# Patient Record
Sex: Male | Born: 1950
Health system: Southern US, Community
[De-identification: ages and names within clinical notes are randomized; demographics above are authoritative.]

## PROBLEM LIST (undated history)

## (undated) DIAGNOSIS — I739 Peripheral vascular disease, unspecified: Secondary | ICD-10-CM

## (undated) DIAGNOSIS — Z01810 Encounter for preprocedural cardiovascular examination: Secondary | ICD-10-CM

## (undated) DIAGNOSIS — I10 Essential (primary) hypertension: Secondary | ICD-10-CM

## (undated) DIAGNOSIS — I2510 Atherosclerotic heart disease of native coronary artery without angina pectoris: Secondary | ICD-10-CM

## (undated) DIAGNOSIS — IMO0002 Reserved for concepts with insufficient information to code with codable children: Secondary | ICD-10-CM

## (undated) DIAGNOSIS — R339 Retention of urine, unspecified: Secondary | ICD-10-CM

## (undated) DIAGNOSIS — E785 Hyperlipidemia, unspecified: Secondary | ICD-10-CM

## (undated) DIAGNOSIS — K635 Polyp of colon: Secondary | ICD-10-CM

## (undated) DIAGNOSIS — J449 Chronic obstructive pulmonary disease, unspecified: Secondary | ICD-10-CM

## (undated) DIAGNOSIS — Z720 Tobacco use: Secondary | ICD-10-CM

## (undated) DIAGNOSIS — R0602 Shortness of breath: Secondary | ICD-10-CM

## (undated) DIAGNOSIS — L270 Generalized skin eruption due to drugs and medicaments taken internally: Secondary | ICD-10-CM

## (undated) DIAGNOSIS — I714 Abdominal aortic aneurysm, without rupture, unspecified: Secondary | ICD-10-CM

## (undated) DIAGNOSIS — C3492 Malignant neoplasm of unspecified part of left bronchus or lung: Secondary | ICD-10-CM

## (undated) DIAGNOSIS — R911 Solitary pulmonary nodule: Secondary | ICD-10-CM

## (undated) DIAGNOSIS — F99 Mental disorder, not otherwise specified: Secondary | ICD-10-CM

## (undated) DIAGNOSIS — R9430 Abnormal result of cardiovascular function study, unspecified: Secondary | ICD-10-CM

## (undated) DIAGNOSIS — N400 Enlarged prostate without lower urinary tract symptoms: Secondary | ICD-10-CM

## (undated) HISTORY — DX: Abdominal aortic aneurysm, without rupture, unspecified: I71.40

## (undated) HISTORY — DX: Peripheral vascular disease, unspecified: I73.9

## (undated) HISTORY — DX: Tobacco use: Z72.0

## (undated) HISTORY — DX: Mental disorder, not otherwise specified: F99

## (undated) HISTORY — DX: Chronic obstructive pulmonary disease, unspecified: J44.9

## (undated) HISTORY — DX: Generalized skin eruption due to drugs and medicaments taken internally: L27.0

## (undated) HISTORY — DX: Solitary pulmonary nodule: R91.1

## (undated) HISTORY — DX: Encounter for preprocedural cardiovascular examination: Z01.810

## (undated) HISTORY — DX: Atherosclerotic heart disease of native coronary artery without angina pectoris: I25.10

## (undated) HISTORY — DX: Abdominal aortic aneurysm, without rupture: I71.4

## (undated) HISTORY — DX: Reserved for concepts with insufficient information to code with codable children: IMO0002

## (undated) HISTORY — DX: Polyp of colon: K63.5

## (undated) HISTORY — DX: Malignant neoplasm of unspecified part of left bronchus or lung: C34.92

## (undated) HISTORY — DX: Abnormal result of cardiovascular function study, unspecified: R94.30

---

## 2008-07-10 HISTORY — PX: CORONARY ARTERY BYPASS GRAFT: SHX141

## 2008-07-28 ENCOUNTER — Ambulatory Visit: Payer: Self-pay | Admitting: Cardiology

## 2008-07-28 ENCOUNTER — Inpatient Hospital Stay (HOSPITAL_COMMUNITY): Admission: AD | Admit: 2008-07-28 | Discharge: 2008-08-07 | Payer: Self-pay | Admitting: Cardiology

## 2008-07-28 ENCOUNTER — Encounter: Payer: Self-pay | Admitting: Cardiology

## 2008-07-28 ENCOUNTER — Encounter (INDEPENDENT_AMBULATORY_CARE_PROVIDER_SITE_OTHER): Payer: Self-pay | Admitting: *Deleted

## 2008-07-28 ENCOUNTER — Encounter: Payer: Self-pay | Admitting: Emergency Medicine

## 2008-07-29 ENCOUNTER — Encounter (INDEPENDENT_AMBULATORY_CARE_PROVIDER_SITE_OTHER): Payer: Self-pay | Admitting: *Deleted

## 2008-07-29 ENCOUNTER — Ambulatory Visit: Payer: Self-pay | Admitting: Cardiothoracic Surgery

## 2008-07-29 ENCOUNTER — Encounter: Payer: Self-pay | Admitting: Cardiology

## 2008-07-29 ENCOUNTER — Ambulatory Visit: Payer: Self-pay | Admitting: Internal Medicine

## 2008-07-30 ENCOUNTER — Encounter: Payer: Self-pay | Admitting: Internal Medicine

## 2008-08-17 ENCOUNTER — Ambulatory Visit: Payer: Self-pay | Admitting: Cardiothoracic Surgery

## 2008-08-23 ENCOUNTER — Ambulatory Visit: Payer: Self-pay | Admitting: Cardiology

## 2008-08-23 ENCOUNTER — Encounter: Payer: Self-pay | Admitting: Nurse Practitioner

## 2008-08-23 DIAGNOSIS — I5022 Chronic systolic (congestive) heart failure: Secondary | ICD-10-CM

## 2008-08-23 DIAGNOSIS — K921 Melena: Secondary | ICD-10-CM

## 2008-08-26 ENCOUNTER — Encounter: Payer: Self-pay | Admitting: Cardiology

## 2008-08-26 ENCOUNTER — Encounter: Admission: RE | Admit: 2008-08-26 | Discharge: 2008-08-26 | Payer: Self-pay | Admitting: Cardiothoracic Surgery

## 2008-08-26 ENCOUNTER — Ambulatory Visit: Payer: Self-pay | Admitting: Cardiothoracic Surgery

## 2008-09-09 ENCOUNTER — Encounter: Payer: Self-pay | Admitting: Cardiology

## 2008-09-25 ENCOUNTER — Encounter: Payer: Self-pay | Admitting: Cardiology

## 2008-10-13 ENCOUNTER — Encounter: Payer: Self-pay | Admitting: Cardiology

## 2008-11-09 ENCOUNTER — Encounter: Payer: Self-pay | Admitting: Cardiology

## 2008-11-09 DIAGNOSIS — Z951 Presence of aortocoronary bypass graft: Secondary | ICD-10-CM

## 2008-11-10 ENCOUNTER — Ambulatory Visit: Payer: Self-pay | Admitting: Cardiology

## 2008-11-12 DIAGNOSIS — R109 Unspecified abdominal pain: Secondary | ICD-10-CM | POA: Insufficient documentation

## 2008-11-12 DIAGNOSIS — K5900 Constipation, unspecified: Secondary | ICD-10-CM | POA: Insufficient documentation

## 2008-12-28 ENCOUNTER — Ambulatory Visit: Payer: Self-pay | Admitting: Internal Medicine

## 2008-12-28 LAB — CONVERTED CEMR LAB
Basophils Absolute: 0 10*3/uL (ref 0.0–0.1)
HCT: 44.6 % (ref 39.0–52.0)
Lymphs Abs: 2.1 10*3/uL (ref 0.7–4.0)
MCV: 90.8 fL (ref 78.0–100.0)
Monocytes Absolute: 0.4 10*3/uL (ref 0.1–1.0)
Platelets: 216 10*3/uL (ref 150.0–400.0)
RDW: 17 % — ABNORMAL HIGH (ref 11.5–14.6)
Vitamin B-12: 271 pg/mL (ref 211–911)

## 2009-01-03 ENCOUNTER — Ambulatory Visit: Payer: Self-pay | Admitting: Internal Medicine

## 2009-01-03 ENCOUNTER — Encounter: Payer: Self-pay | Admitting: Internal Medicine

## 2009-01-05 ENCOUNTER — Encounter: Payer: Self-pay | Admitting: Internal Medicine

## 2009-02-25 ENCOUNTER — Ambulatory Visit: Payer: Self-pay | Admitting: Cardiology

## 2009-03-11 ENCOUNTER — Encounter: Payer: Self-pay | Admitting: Infectious Diseases

## 2009-06-29 ENCOUNTER — Encounter (INDEPENDENT_AMBULATORY_CARE_PROVIDER_SITE_OTHER): Payer: Self-pay | Admitting: *Deleted

## 2010-02-20 ENCOUNTER — Encounter: Payer: Self-pay | Admitting: Internal Medicine

## 2010-02-20 ENCOUNTER — Telehealth: Payer: Self-pay | Admitting: Internal Medicine

## 2010-04-13 NOTE — Progress Notes (Signed)
Summary: Needs Bloodwork    ---- 02/20/2010 8:16 AM, Karen Kitchens Nelson-Smith CMA (AAMA) wrote: This patient's endoscopy report in 10/10 states that patients h & h needs to be followed closely.Marland KitchenMarland KitchenMarland KitchenI dont see where they have had labs completed in some time....would you like me to get a CBC?  ---- 02/20/2010 8:29 AM, Hart Carwin MD wrote: Yes, please. I just don't remember who is his PCP, may be , he would rather have his CBC checked  with him if he is out of town.  Phone Note Outgoing Call   Call placed by: Lamona Curl CMA Duncan Dull),  February 20, 2010 10:54 AM Call placed to: Patient Summary of Call: I have tried to contact patient on phone number originally provided. However, this is the incorrect number. There are no alternative numbers to call as his emergency contact has the same number as the patient. I have updated our system by taking this number out. I will send a letter to patient asking him to call with updated information. Initial call taken by: Lamona Curl CMA Duncan Dull),  February 20, 2010 10:56 AM

## 2010-04-13 NOTE — Letter (Signed)
Summary: Appointment - Missed  [facility], Main Office  [address]   [place], [place] [address]   Phone: [phone]  Fax: [phone]     June 29, 2009 MRN: [id number]   [name] [address] [place], [place]  [address]   Dear [name],  Our records indicate you missed your appointment on  06/27/2009 with Dr. Myrtis Ser. It is very important that we reach you to reschedule this appointment. We look forward to participating in your health care needs. Please contact us at the number listed above at your earliest convenience to reschedule this appointment.     Sincerely,   Migdalia Dk [facility] Scheduling Team

## 2010-04-13 NOTE — Letter (Signed)
Summary: Update demographics/need [facility]  [address] [place], [place] [address]   Phone: [phone]  Fax: [phone]                    02/20/2010  [name] [address] [place], [place]  [address]  [place]  Dear Mr. Harkleroad,  Our office has recently attempted to reach you regarding some needed labwork. Unfortunately, the number available to our office as your contact number is the incorrect number. Please call our office as soon as possible to update your demographic information and discuss labwork. Thank you for your prompt attention to this matter.  Sincerely,     Hedwig Morton. Juanda Chance, MD  Appended Document: Update demographics/need labwork As of date, patient has not responded to our letter.

## 2010-06-20 LAB — CBC
HCT: 28.9 % — ABNORMAL LOW (ref 39.0–52.0)
HCT: 29.1 % — ABNORMAL LOW (ref 39.0–52.0)
HCT: 29.3 % — ABNORMAL LOW (ref 39.0–52.0)
HCT: 29.7 % — ABNORMAL LOW (ref 39.0–52.0)
HCT: 29.9 % — ABNORMAL LOW (ref 39.0–52.0)
HCT: 35.2 % — ABNORMAL LOW (ref 39.0–52.0)
HCT: 35.3 % — ABNORMAL LOW (ref 39.0–52.0)
HCT: 37.2 % — ABNORMAL LOW (ref 39.0–52.0)
HCT: 50.3 % (ref 39.0–52.0)
Hemoglobin: 10 g/dL — ABNORMAL LOW (ref 13.0–17.0)
Hemoglobin: 10.1 g/dL — ABNORMAL LOW (ref 13.0–17.0)
Hemoglobin: 10.4 g/dL — ABNORMAL LOW (ref 13.0–17.0)
Hemoglobin: 11.8 g/dL — ABNORMAL LOW (ref 13.0–17.0)
Hemoglobin: 11.8 g/dL — ABNORMAL LOW (ref 13.0–17.0)
Hemoglobin: 12.1 g/dL — ABNORMAL LOW (ref 13.0–17.0)
Hemoglobin: 12.4 g/dL — ABNORMAL LOW (ref 13.0–17.0)
Hemoglobin: 12.7 g/dL — ABNORMAL LOW (ref 13.0–17.0)
Hemoglobin: 12.8 g/dL — ABNORMAL LOW (ref 13.0–17.0)
Hemoglobin: 13.5 g/dL (ref 13.0–17.0)
Hemoglobin: 16.7 g/dL (ref 13.0–17.0)
Hemoglobin: 9.7 g/dL — ABNORMAL LOW (ref 13.0–17.0)
Hemoglobin: 9.9 g/dL — ABNORMAL LOW (ref 13.0–17.0)
MCHC: 33.5 g/dL (ref 30.0–36.0)
MCHC: 33.5 g/dL (ref 30.0–36.0)
MCHC: 33.6 g/dL (ref 30.0–36.0)
MCHC: 33.7 g/dL (ref 30.0–36.0)
MCHC: 33.9 g/dL (ref 30.0–36.0)
MCHC: 34 g/dL (ref 30.0–36.0)
MCHC: 34.2 g/dL (ref 30.0–36.0)
MCHC: 34.3 g/dL (ref 30.0–36.0)
MCHC: 34.3 g/dL (ref 30.0–36.0)
MCHC: 34.7 g/dL (ref 30.0–36.0)
MCV: 91.6 fL (ref 78.0–100.0)
MCV: 91.9 fL (ref 78.0–100.0)
MCV: 92.3 fL (ref 78.0–100.0)
MCV: 92.4 fL (ref 78.0–100.0)
MCV: 92.5 fL (ref 78.0–100.0)
MCV: 92.6 fL (ref 78.0–100.0)
MCV: 92.6 fL (ref 78.0–100.0)
MCV: 92.7 fL (ref 78.0–100.0)
MCV: 92.8 fL (ref 78.0–100.0)
MCV: 93.9 fL (ref 78.0–100.0)
Platelets: 173 10*3/uL (ref 150–400)
Platelets: 181 10*3/uL (ref 150–400)
Platelets: 182 10*3/uL (ref 150–400)
Platelets: 192 10*3/uL (ref 150–400)
Platelets: 206 10*3/uL (ref 150–400)
Platelets: 212 10*3/uL (ref 150–400)
Platelets: 239 10*3/uL (ref 150–400)
Platelets: 268 10*3/uL (ref 150–400)
RBC: 3.07 MIL/uL — ABNORMAL LOW (ref 4.22–5.81)
RBC: 3.14 MIL/uL — ABNORMAL LOW (ref 4.22–5.81)
RBC: 3.17 MIL/uL — ABNORMAL LOW (ref 4.22–5.81)
RBC: 3.21 MIL/uL — ABNORMAL LOW (ref 4.22–5.81)
RBC: 3.25 MIL/uL — ABNORMAL LOW (ref 4.22–5.81)
RBC: 3.71 MIL/uL — ABNORMAL LOW (ref 4.22–5.81)
RBC: 3.81 MIL/uL — ABNORMAL LOW (ref 4.22–5.81)
RBC: 3.95 MIL/uL — ABNORMAL LOW (ref 4.22–5.81)
RBC: 4.11 MIL/uL — ABNORMAL LOW (ref 4.22–5.81)
RBC: 4.34 MIL/uL (ref 4.22–5.81)
RDW: 13.9 % (ref 11.5–15.5)
RDW: 14 % (ref 11.5–15.5)
RDW: 14 % (ref 11.5–15.5)
RDW: 14.2 % (ref 11.5–15.5)
RDW: 14.2 % (ref 11.5–15.5)
RDW: 14.3 % (ref 11.5–15.5)
RDW: 14.4 % (ref 11.5–15.5)
RDW: 14.4 % (ref 11.5–15.5)
RDW: 14.4 % (ref 11.5–15.5)
WBC: 10 10*3/uL (ref 4.0–10.5)
WBC: 10.3 10*3/uL (ref 4.0–10.5)
WBC: 10.7 10*3/uL — ABNORMAL HIGH (ref 4.0–10.5)
WBC: 5.9 10*3/uL (ref 4.0–10.5)
WBC: 7.2 10*3/uL (ref 4.0–10.5)
WBC: 9.2 10*3/uL (ref 4.0–10.5)
WBC: 9.3 10*3/uL (ref 4.0–10.5)
WBC: 9.5 10*3/uL (ref 4.0–10.5)
WBC: 9.6 10*3/uL (ref 4.0–10.5)

## 2010-06-20 LAB — CROSSMATCH
ABO/RH(D): O POS
Antibody Screen: NEGATIVE

## 2010-06-20 LAB — TROPONIN I
Troponin I: 12.76 ng/mL (ref 0.00–0.06)
Troponin I: 15.69 ng/mL (ref 0.00–0.06)

## 2010-06-20 LAB — DIFFERENTIAL
Basophils Absolute: 0 10*3/uL (ref 0.0–0.1)
Basophils Relative: 0 % (ref 0–1)
Basophils Relative: 0 % (ref 0–1)
Eosinophils Absolute: 0 10*3/uL (ref 0.0–0.7)
Eosinophils Relative: 0 % (ref 0–5)
Lymphocytes Relative: 7 % — ABNORMAL LOW (ref 12–46)
Lymphs Abs: 1.4 10*3/uL (ref 0.7–4.0)
Monocytes Absolute: 0.6 10*3/uL (ref 0.1–1.0)
Monocytes Relative: 9 % (ref 3–12)
Neutro Abs: 9 10*3/uL — ABNORMAL HIGH (ref 1.7–7.7)
Neutrophils Relative %: 79 % — ABNORMAL HIGH (ref 43–77)
Neutrophils Relative %: 88 % — ABNORMAL HIGH (ref 43–77)

## 2010-06-20 LAB — POCT I-STAT 4, (NA,K, GLUC, HGB,HCT)
Glucose, Bld: 102 mg/dL — ABNORMAL HIGH (ref 70–99)
Glucose, Bld: 111 mg/dL — ABNORMAL HIGH (ref 70–99)
HCT: 27 % — ABNORMAL LOW (ref 39.0–52.0)
HCT: 37 % — ABNORMAL LOW (ref 39.0–52.0)
HCT: 38 % — ABNORMAL LOW (ref 39.0–52.0)
Hemoglobin: 12.2 g/dL — ABNORMAL LOW (ref 13.0–17.0)
Hemoglobin: 12.6 g/dL — ABNORMAL LOW (ref 13.0–17.0)
Hemoglobin: 12.9 g/dL — ABNORMAL LOW (ref 13.0–17.0)
Hemoglobin: 9.2 g/dL — ABNORMAL LOW (ref 13.0–17.0)
Potassium: 4 mEq/L (ref 3.5–5.1)
Potassium: 4.3 mEq/L (ref 3.5–5.1)
Potassium: 5.7 mEq/L — ABNORMAL HIGH (ref 3.5–5.1)
Sodium: 136 mEq/L (ref 135–145)
Sodium: 137 mEq/L (ref 135–145)
Sodium: 138 mEq/L (ref 135–145)
Sodium: 141 mEq/L (ref 135–145)

## 2010-06-20 LAB — GLUCOSE, CAPILLARY
Glucose-Capillary: 100 mg/dL — ABNORMAL HIGH (ref 70–99)
Glucose-Capillary: 101 mg/dL — ABNORMAL HIGH (ref 70–99)
Glucose-Capillary: 101 mg/dL — ABNORMAL HIGH (ref 70–99)
Glucose-Capillary: 106 mg/dL — ABNORMAL HIGH (ref 70–99)
Glucose-Capillary: 111 mg/dL — ABNORMAL HIGH (ref 70–99)
Glucose-Capillary: 121 mg/dL — ABNORMAL HIGH (ref 70–99)
Glucose-Capillary: 127 mg/dL — ABNORMAL HIGH (ref 70–99)
Glucose-Capillary: 150 mg/dL — ABNORMAL HIGH (ref 70–99)
Glucose-Capillary: 68 mg/dL — ABNORMAL LOW (ref 70–99)
Glucose-Capillary: 71 mg/dL (ref 70–99)
Glucose-Capillary: 71 mg/dL (ref 70–99)
Glucose-Capillary: 91 mg/dL (ref 70–99)
Glucose-Capillary: 91 mg/dL (ref 70–99)
Glucose-Capillary: 94 mg/dL (ref 70–99)
Glucose-Capillary: 97 mg/dL (ref 70–99)

## 2010-06-20 LAB — BASIC METABOLIC PANEL
BUN: 12 mg/dL (ref 6–23)
BUN: 13 mg/dL (ref 6–23)
BUN: 14 mg/dL (ref 6–23)
BUN: 8 mg/dL (ref 6–23)
CO2: 27 mEq/L (ref 19–32)
CO2: 28 mEq/L (ref 19–32)
CO2: 29 mEq/L (ref 19–32)
CO2: 30 mEq/L (ref 19–32)
Calcium: 8.2 mg/dL — ABNORMAL LOW (ref 8.4–10.5)
Calcium: 8.4 mg/dL (ref 8.4–10.5)
Calcium: 8.6 mg/dL (ref 8.4–10.5)
Calcium: 8.6 mg/dL (ref 8.4–10.5)
Calcium: 8.9 mg/dL (ref 8.4–10.5)
Chloride: 103 mEq/L (ref 96–112)
Chloride: 104 mEq/L (ref 96–112)
Chloride: 105 mEq/L (ref 96–112)
Chloride: 106 mEq/L (ref 96–112)
Chloride: 99 mEq/L (ref 96–112)
Creatinine, Ser: 0.67 mg/dL (ref 0.4–1.5)
Creatinine, Ser: 0.67 mg/dL (ref 0.4–1.5)
Creatinine, Ser: 0.71 mg/dL (ref 0.4–1.5)
Creatinine, Ser: 0.77 mg/dL (ref 0.4–1.5)
Creatinine, Ser: 0.81 mg/dL (ref 0.4–1.5)
GFR calc Af Amer: >60 mL/min (ref >60)
GFR calc Af Amer: >60 mL/min (ref >60)
GFR calc Af Amer: >60 mL/min (ref >60)
GFR calc Af Amer: >60 mL/min (ref >60)
GFR calc Af Amer: >60 mL/min (ref >60)
GFR calc non Af Amer: >60 mL/min (ref >60)
GFR calc non Af Amer: >60 mL/min (ref >60)
GFR calc non Af Amer: >60 mL/min (ref >60)
GFR calc non Af Amer: >60 mL/min (ref >60)
Glucose, Bld: 102 mg/dL — ABNORMAL HIGH (ref 70–99)
Glucose, Bld: 103 mg/dL — ABNORMAL HIGH (ref 70–99)
Glucose, Bld: 114 mg/dL — ABNORMAL HIGH (ref 70–99)
Glucose, Bld: 122 mg/dL — ABNORMAL HIGH (ref 70–99)
Potassium: 3.8 mEq/L (ref 3.5–5.1)
Potassium: 4.2 mEq/L (ref 3.5–5.1)
Potassium: 4.5 mEq/L (ref 3.5–5.1)
Potassium: 4.5 mEq/L (ref 3.5–5.1)
Sodium: 136 mEq/L (ref 135–145)
Sodium: 136 mEq/L (ref 135–145)
Sodium: 137 mEq/L (ref 135–145)
Sodium: 138 mEq/L (ref 135–145)
Sodium: 141 mEq/L (ref 135–145)

## 2010-06-20 LAB — BLOOD GAS, ARTERIAL
Acid-Base Excess: 4.9 mmol/L — ABNORMAL HIGH (ref 0.0–2.0)
Bicarbonate: 28.8 mEq/L — ABNORMAL HIGH (ref 20.0–24.0)
FIO2: 0.21 %
O2 Saturation: 97.4 %
Patient temperature: 98.6
TCO2: 30.1 mmol/L (ref 0–100)
pCO2 arterial: 42 mmHg (ref 35.0–45.0)
pH, Arterial: 7.45 (ref 7.350–7.450)
pO2, Arterial: 85.2 mmHg (ref 80.0–100.0)

## 2010-06-20 LAB — PROTIME-INR
INR: 1.3 (ref 0.00–1.49)
Prothrombin Time: 14.1 seconds (ref 11.6–15.2)
Prothrombin Time: 16.4 seconds — ABNORMAL HIGH (ref 11.6–15.2)

## 2010-06-20 LAB — APTT
aPTT: 115 seconds — ABNORMAL HIGH (ref 24–37)
aPTT: 28 seconds (ref 24–37)
aPTT: 35 seconds (ref 24–37)

## 2010-06-20 LAB — COMPREHENSIVE METABOLIC PANEL
ALT: 30 U/L (ref 0–53)
AST: 94 U/L — ABNORMAL HIGH (ref 0–37)
Albumin: 2.7 g/dL — ABNORMAL LOW (ref 3.5–5.2)
Albumin: 3.9 g/dL (ref 3.5–5.2)
Alkaline Phosphatase: 64 U/L (ref 39–117)
Alkaline Phosphatase: 72 U/L (ref 39–117)
Alkaline Phosphatase: 76 U/L (ref 39–117)
BUN: 11 mg/dL (ref 6–23)
BUN: 12 mg/dL (ref 6–23)
BUN: 13 mg/dL (ref 6–23)
CO2: 24 mEq/L (ref 19–32)
Calcium: 8.5 mg/dL (ref 8.4–10.5)
Calcium: 8.5 mg/dL (ref 8.4–10.5)
Chloride: 102 mEq/L (ref 96–112)
Chloride: 104 mEq/L (ref 96–112)
Creatinine, Ser: 0.8 mg/dL (ref 0.4–1.5)
Creatinine, Ser: 1.12 mg/dL (ref 0.4–1.5)
GFR calc Af Amer: >60 mL/min (ref >60)
GFR calc non Af Amer: >60 mL/min (ref >60)
Glucose, Bld: 106 mg/dL — ABNORMAL HIGH (ref 70–99)
Glucose, Bld: 114 mg/dL — ABNORMAL HIGH (ref 70–99)
Glucose, Bld: 144 mg/dL — ABNORMAL HIGH (ref 70–99)
Glucose, Bld: 172 mg/dL — ABNORMAL HIGH (ref 70–99)
Potassium: 3.7 mEq/L (ref 3.5–5.1)
Potassium: 3.7 mEq/L (ref 3.5–5.1)
Potassium: 3.8 mEq/L (ref 3.5–5.1)
Sodium: 136 mEq/L (ref 135–145)
Total Bilirubin: 0.3 mg/dL (ref 0.3–1.2)
Total Bilirubin: 0.9 mg/dL (ref 0.3–1.2)
Total Protein: 5.7 g/dL — ABNORMAL LOW (ref 6.0–8.3)
Total Protein: 6 g/dL (ref 6.0–8.3)

## 2010-06-20 LAB — HEPARIN LEVEL (UNFRACTIONATED)
Heparin Unfractionated: 0.32 IU/mL (ref 0.30–0.70)
Heparin Unfractionated: 0.33 IU/mL (ref 0.30–0.70)
Heparin Unfractionated: 0.6 IU/mL (ref 0.30–0.70)
Heparin Unfractionated: 0.71 IU/mL — ABNORMAL HIGH (ref 0.30–0.70)

## 2010-06-20 LAB — URINALYSIS, ROUTINE W REFLEX MICROSCOPIC
Leukocytes, UA: NEGATIVE
Nitrite: NEGATIVE
Protein, ur: 100 mg/dL — AB
Specific Gravity, Urine: 1.021 (ref 1.005–1.030)
Urobilinogen, UA: 0.2 mg/dL (ref 0.0–1.0)

## 2010-06-20 LAB — POCT I-STAT 3, ART BLOOD GAS (G3+)
Acid-Base Excess: 1 mmol/L (ref 0.0–2.0)
Acid-Base Excess: 2 mmol/L (ref 0.0–2.0)
Bicarbonate: 27.2 mEq/L — ABNORMAL HIGH (ref 20.0–24.0)
Bicarbonate: 27.2 mEq/L — ABNORMAL HIGH (ref 20.0–24.0)
O2 Saturation: 100 %
O2 Saturation: 97 %
O2 Saturation: 99 %
Patient temperature: 35.7
Patient temperature: 37.1
Patient temperature: 37.1
TCO2: 28 mmol/L (ref 0–100)
TCO2: 28 mmol/L (ref 0–100)
pCO2 arterial: 26 mmHg — ABNORMAL LOW (ref 35.0–45.0)
pH, Arterial: 7.377 (ref 7.350–7.450)
pH, Arterial: 7.408 (ref 7.350–7.450)
pO2, Arterial: 203 mmHg — ABNORMAL HIGH (ref 80.0–100.0)
pO2, Arterial: 258 mmHg — ABNORMAL HIGH (ref 80.0–100.0)

## 2010-06-20 LAB — URINE MICROSCOPIC-ADD ON

## 2010-06-20 LAB — CK TOTAL AND CKMB (NOT AT ARMC)
CK, MB: 19.3 ng/mL — ABNORMAL HIGH (ref 0.3–4.0)
CK, MB: 47.1 ng/mL — ABNORMAL HIGH (ref 0.3–4.0)
Relative Index: 13.8 — ABNORMAL HIGH (ref 0.0–2.5)
Relative Index: 2.9 — ABNORMAL HIGH (ref 0.0–2.5)
Relative Index: 7.4 — ABNORMAL HIGH (ref 0.0–2.5)
Total CK: 1262 U/L — ABNORMAL HIGH (ref 7–232)
Total CK: 639 U/L — ABNORMAL HIGH (ref 7–232)
Total CK: 677 U/L — ABNORMAL HIGH (ref 7–232)

## 2010-06-20 LAB — RAPID URINE DRUG SCREEN, HOSP PERFORMED
Amphetamines: NOT DETECTED
Benzodiazepines: NOT DETECTED
Cocaine: NOT DETECTED

## 2010-06-20 LAB — CREATININE, SERUM
Creatinine, Ser: 0.67 mg/dL (ref 0.4–1.5)
GFR calc Af Amer: >60 mL/min (ref >60)
GFR calc non Af Amer: >60 mL/min (ref >60)

## 2010-06-20 LAB — DRUG SCREEN PANEL (SERUM)
Amphetamine Scrn: NEGATIVE
Benzodiazepine Scrn: NEGATIVE
Cannabinoid, Blood: NEGATIVE
Methadone (Dolophine), Serum: NEGATIVE
Opiates, Blood: NEGATIVE
Phencyclidine, Serum: NEGATIVE
Propoxyphene,Serum: NEGATIVE

## 2010-06-20 LAB — LIPID PANEL
HDL: 75 mg/dL (ref >39)
Total CHOL/HDL Ratio: 2.9 RATIO
VLDL: 17 mg/dL (ref 0–40)

## 2010-06-20 LAB — POCT I-STAT 3, VENOUS BLOOD GAS (G3P V)
Bicarbonate: 25.6 mEq/L — ABNORMAL HIGH (ref 20.0–24.0)
pCO2, Ven: 27 mmHg — ABNORMAL LOW (ref 45.0–50.0)
pH, Ven: 7.56 — ABNORMAL HIGH (ref 7.250–7.300)
pO2, Ven: 29 mmHg — CL (ref 30.0–45.0)

## 2010-06-20 LAB — HEPATIC FUNCTION PANEL
ALT: 45 U/L (ref 0–53)
AST: 51 U/L — ABNORMAL HIGH (ref 0–37)
Albumin: 2.7 g/dL — ABNORMAL LOW (ref 3.5–5.2)
Alkaline Phosphatase: 163 U/L — ABNORMAL HIGH (ref 39–117)
Bilirubin, Direct: 0.1 mg/dL (ref 0.0–0.3)
Indirect Bilirubin: 0.2 mg/dL — ABNORMAL LOW (ref 0.3–0.9)
Total Bilirubin: 0.3 mg/dL (ref 0.3–1.2)
Total Protein: 5.8 g/dL — ABNORMAL LOW (ref 6.0–8.3)

## 2010-06-20 LAB — MAGNESIUM
Magnesium: 2.3 mg/dL (ref 1.5–2.5)
Magnesium: 2.5 mg/dL (ref 1.5–2.5)
Magnesium: 3.4 mg/dL — ABNORMAL HIGH (ref 1.5–2.5)

## 2010-06-20 LAB — POCT I-STAT, CHEM 8
Calcium, Ion: 1.14 mmol/L (ref 1.12–1.32)
Creatinine, Ser: 0.8 mg/dL (ref 0.4–1.5)
Glucose, Bld: 121 mg/dL — ABNORMAL HIGH (ref 70–99)
HCT: 33 % — ABNORMAL LOW (ref 39.0–52.0)
Hemoglobin: 11.2 g/dL — ABNORMAL LOW (ref 13.0–17.0)
Potassium: 4.7 mEq/L (ref 3.5–5.1)

## 2010-06-20 LAB — POCT I-STAT GLUCOSE
Glucose, Bld: 104 mg/dL — ABNORMAL HIGH (ref 70–99)
Operator id: 3390

## 2010-06-20 LAB — CARDIAC PANEL(CRET KIN+CKTOT+MB+TROPI)
CK, MB: 120.3 ng/mL — ABNORMAL HIGH (ref 0.3–4.0)
Relative Index: 10.3 — ABNORMAL HIGH (ref 0.0–2.5)
Total CK: 1173 U/L — ABNORMAL HIGH (ref 7–232)
Total CK: 1401 U/L — ABNORMAL HIGH (ref 7–232)
Total CK: 870 U/L — ABNORMAL HIGH (ref 7–232)
Troponin I: 16.33 ng/mL (ref 0.00–0.06)
Troponin I: 16.83 ng/mL (ref 0.00–0.06)

## 2010-06-20 LAB — HEMOGLOBIN A1C
Hgb A1c MFr Bld: 6.4 % — ABNORMAL HIGH (ref 4.6–6.1)
Mean Plasma Glucose: 137 mg/dL

## 2010-06-20 LAB — LIPASE, BLOOD: Lipase: 11 U/L (ref 11–59)

## 2010-06-20 LAB — BRAIN NATRIURETIC PEPTIDE: Pro B Natriuretic peptide (BNP): 138 pg/mL — ABNORMAL HIGH (ref 0.0–100.0)

## 2010-06-20 LAB — CEA: CEA: 1.3 ng/mL (ref 0.0–5.0)

## 2010-07-25 NOTE — H&P (Signed)
NAME:  [name], TATA NO.:  [id number]   MEDICAL RECORD NO.:  [id number]          PATIENT TYPE:  INP   LOCATION:  2913                         FACILITY:  [facility]   PHYSICIAN:  Luis Abed, MD, FACCDATE OF BIRTH:  10-31-1950   DATE OF ADMISSION:  07/28/2008  DATE OF DISCHARGE:                              HISTORY & PHYSICAL   Mr. Hamor is a 60 year old African American gentleman with no known  history of coronary artery disease, somewhat poor historian.  No family  present at this time.  Mr. Sugg denies any health problems.  He is not  on any medications.  He has no allergies.  No previous surgeries.  He  states that the only thing he has ever been seen for was mental illness  requiring hospitalizations, unclear what diagnosis is.  He presented to  [facility] Emergency Room late last night.  His chief complaint was of  abdominal pain x2 days, dizziness, and feeling off balance.  The patient  states he normally has a bowel movement every other day, had not had a  bowel movement 3 days.  Apparently his family gave him on laxative.  He  has small liquid stool yesterday and then began to have some abdominal  discomfort and associated dizziness.  When he arrived at [facility], he  began to complain of some chest discomfort, prompting an initiation of  further lab work including cardiac enzymes that have been trending up  with a total CK of 1206 and 1262 and troponin of 12.76 and 15.69.  Mr.  Coba is denying any chest discomfort now.  He is complaining of some  abdominal discomfort.  Abdominal ultrasound checked at [facility] shows  questionable small bowel ileus pattern, COPD in chest.  CT of the head  was also done that showed no acute findings.  Mr. Barnette was also found  to have elevated AST of 134.   PAST MEDICAL HISTORY:  Other than the mental illness reported by the  patient, no previous history although he has not been to the doctor in  years he  said.   SOCIAL HISTORY:  Lives in [place] with his sister.  He is disabled.  He does smoke about a pack a day.  He denies any EtOH or substance  abuse.   FAMILY HISTORY:  Death of parents unknown.  He does have a sister who is  alive and well that he lives with.   REVIEW OF SYSTEMS:  Positive for chest pain, dizziness, falling,  abdominal pain, constipation.  All other systems are reviewed and  negative.   ALLERGIES:  No known drug allergies and no medications.   PHYSICAL EXAMINATION:  VITAL SIGNS:  The patient is afebrile, heart rate  63, respirations 12, blood pressure 146/101.  Note, in reviewing of  blood pressure readings from [facility] ER, the patient's blood  pressure initially was 101/61 with a gradual increase to 187/135.  The  patient was started on nitroglycerin.  Heart rate also fluctuated  between 54 and 110.  GENERAL:  He is in no acute distress.  Has garbled speech, but this is a  normal finding per the patient's report.  Otherwise, he is very  cooperative, pleasant, poor dentition, otherwise HEENT unremarkable.  NECK:  Supple without JVD or bruits.  CARDIOVASCULAR:  S1, S2 regular rate and rhythm.  LUNGS:  Clear to auscultation bilaterally.  SKIN:  Warm and dry.  ABDOMEN:  Soft, flat, nontender.  Positive hypoactive bowel sounds.  LOWER EXTREMITIES: Without clubbing, cyanosis, or edema.  NEUROLOGIC:  He is alert and oriented x3.   No chest x-ray has been obtained.  The patient did have the head CT and  abdominal workup as stated above.  EKG sinus brady.  He does have some T-  wave inversions noted in inferior leads that has remained unchanged over  series of EKGs, rate has been in the 50s.   Lab work shows H and H of 16.7 and 50.3 respectively, WBCs 10.3,  platelets 268,000.  Sodium 140, potassium 4.2, BUN 12, creatinine 1.12,  AST elevated at 134, ALT 38, lipase is 11.  Cardiac enzymes are positive  x2 sets.  Urinalysis was negative.   IMPRESSION:  The  patient with some bleeding, chest discomfort with  positive troponin, also complaining of abdominal pain with increased  LFTs and possible small bowel ileus pattern.  Unclear at this time as to  what exactly is problem.  Dr. Myrtis Ser has been in to examine and assess  patient with fluctuating blood pressure and elevated enzymes,  questionable ileus.  Plan is to get a 2-D echocardiogram now.  If  circumflex myocardial infarction, we will need to proceed with cath.  If  wall motion is normal, we will follow and start abdominal workup, may  need to get Internal Medicine involved.  For now, we will continue the  nitro at 50 mcg for blood pressure.  The patient is denying any chest  discomfort and continue heparin as per pharmacy.  We will continue the  cycle enzymes and repeat LFTs in the a.m.      Dorian Pod, ACNP      Luis Abed, MD, [facility]  Electronically Signed    MB/MEDQ  D:  07/28/2008  T:  07/28/2008  Job:  [id number]

## 2010-07-25 NOTE — Cardiovascular Report (Signed)
NAME:  [name], TROOP               ACCOUNT NO.:  [id number]   MEDICAL RECORD NO.:  [id number]          PATIENT TYPE:  INP   LOCATION:  2913                         FACILITY:  [facility]   PHYSICIAN:  Arturo Morton. Riley Kill, MD, FACCDATE OF BIRTH:  [birth year]   DATE OF PROCEDURE:  DATE OF DISCHARGE:                            CARDIAC CATHETERIZATION   INDICATIONS:  Mr. Gallier is 60 years old.  He presented with what seemed  like abdominal discomfort.  Cardiac enzymes were elevated.  Cardiac  catheterization was recommended.  I did discuss the procedure, its  intended risks and benefits with the patient in the laboratory.  He  agreed to proceed.   Dr. Myrtis Ser has spoken with the patient earlier.   PROCEDURES:  1. Left heart catheterization  2. Selective coronary arteriography.  3. Selective left ventriculography.   DESCRIPTION OF PROCEDURE:  The patient was brought to the cath lab,  prepped and draped in the usual fashion.  Through an anterior puncture,  the femoral artery was easily entered with a direct puncture, and a 5-  French sheath placed.  Views of left and right coronary arteries were  obtained in multiple angiographic projections.  Central aortic and left  ventricular pressures were measured with pigtail.  Ventriculography was  then performed in the RAO projection.  Overall, the patient tolerated  procedure without complication.  His ACT was low, so we pulled the  sheath out in the laboratory itself and I held for more than 5 minutes,  the technical staff then held for an additional 15 minutes.  There were  no major complications.   HEMODYNAMIC DATA:  1. Initial central aortic pressure 134/102, mean 117.  2. Left ventricular pressure was 161/4.  3. There did not appear to be a significant gradient on pullback      across the aortic valve.   ANGIOGRAPHIC DATA:  1. Ventriculography:  Ventriculography done in the RAO projection      reveals a fairly large wall motion abnormality  involving the      inferobasal segment.  This is in the distribution of the occluded      circumflex.  Ejection fraction to be estimated in the 45 to      possibly 50% range.  2. The left main is free of critical disease.  3. The LAD courses to the apex.  The LAD has diffuse luminal      irregularities with 30 to 40% proximal narrowing followed by 70% to      80% area of narrowing after the second diagonal.  The second      diagonal itself has probably 50-70% ostial narrowing.  4. The circumflex provides a first marginal that is smaller and has      diffuse 80% narrowing.  The second marginal was totally occluded      and there is diffuse disease leading into it.  There is retrograde      filling of this vessel from the right and from the circumflex      systems.  5. The right coronary artery is tortuous with an 80% mid lesion  followed by area of diffuse disease then 90% of the bifurcation of      what probably constitutes a posterior descending equivalent, and a      posterolateral system.   CONCLUSIONS:  1. Moderate reduction in left ventricular function.  2. Total occlusion of the distal circumflex.  3. Advanced three-vessel high-grade disease.   DISPOSITION:  The patient will need a surgical consult, he is a good  candidate for surgery remains to be determined.  I have notified Dr.  Myrtis Ser.      Arturo Morton. Riley Kill, MD, [facility]  Electronically Signed     Arturo Morton. Riley Kill, MD, [facility]  Electronically Signed    TDS/MEDQ  D:  07/28/2008  T:  07/29/2008  Job:  [id number]   cc:   Luis Abed, MD, [facility]  CV Laboratory

## 2010-07-25 NOTE — Assessment & Plan Note (Signed)
OFFICE VISIT   [name], [name]  DOB:  19-Aug-1950                                        August 26, 2008  CHART #:  [id number]   The patient had presented with stuttering myocardial infarction in late  May.  Underwent cardiac catheterization after being loaded with Plavix  and was found to have severe three-vessel coronary artery disease and  underwent coronary artery bypass grafting x5 with left internal mammary  to the LAD, reverse saphenous vein graft to the diagonal, reverse  saphenous vein graft to the first obtuse marginal and distal circumflex,  reverse saphenous vein graft to the posterior descending coronary  artery.  Prior to surgery, the patient had vague abdominal discomfort  and was evaluated by GI Service, who wanted to wait on any further  evaluation until after his bypass surgery.  He now returns to the office  today doing amazingly well, now just 3 weeks postop.  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  He has had no recurrent angina or  evidence of congestive heart failure.  Unfortunately, he has cut back on  smoking, but still smokes several cigarettes a day.  His family notes  that he does not buy a pack at a time, but buys one cigarette at a time.   PHYSICAL EXAMINATION:  VITAL SIGNS:  His blood pressure 136/79, pulse  78, respiratory rate is 18, and O2 sats 98%.  CHEST:  His sternum is stable and healing well.  INCISION:  His vein harvest sites are healing well.  The staples have  been removed.   Followup chest x-ray shows clear lung fields bilaterally without  evidence of COPD.   He continues on aspirin 325 mg a day, Toprol-XL 25 mg a day, lisinopril  2.5 mg a day, Crestor 10 mg once a day, folic acid, and was given a  prescription for pain medicine but his family notes that he is  taking no  pain medicine postoperatively.   Overall, I am very pleased with his progress.  He has been back to see  Dr. Myrtis Ser and he notes that he has been arranged for him to follow up  with [facility], who saw him in the hospital.  I have not made him a  return appointment to see me, but would be glad to at his or Dr. Henrietta Hoover  request.   Sheliah Plane, MD  Electronically Signed   EG/MEDQ  D:  08/26/2008  T:  08/27/2008  Job:  [id number]   cc:   Luis Abed, MD, [facility]  Arturo Morton. Riley Kill, MD, [facility]

## 2010-07-25 NOTE — Consult Note (Signed)
NAME:  [name], [name] NO.:  [id number]   MEDICAL RECORD NO.:  [id number]          PATIENT TYPE:  INP   LOCATION:  2913                         FACILITY:  [facility]   PHYSICIAN:  Sheliah Plane, MD    DATE OF BIRTH:  Jul 27, 1950   DATE OF CONSULTATION:  07/29/2008  DATE OF DISCHARGE:                                 CONSULTATION   REQUESTING PHYSICIAN:  Arturo Morton. Riley Kill, MD, FACC   CARDIOLOGIST:  Luis Abed, MD, [facility]   PRIMARY CARE PHYSICIAN:  Unknown.   REASON FOR CONSULTATION:  Acute myocardial infarction.   HISTORY OF PRESENT ILLNESS:  The patient is a 60 year old black male  with a known previous history of coronary artery disease who presents  with 2 days of epigastric and a chest discomfort increasing in  intensity, came to the [facility] Emergency Room, and underwent some  cardiac and abdominal evaluation.  His troponins were noted to be  elevated.  He was transferred to [facility], seen by Dr. Myrtis Ser and  underwent urgent cardiac catheterization for a STEMI by Dr. Riley Kill  yesterday, found to have occluded circ and otherwise 3-vessel disease.  The patient was loaded with 375 mg of Plavix yesterday.  He has had no  previous history of cardiac disease.  No history of myocardial  infarction or previous cardiac catheterization.  He did have some  shortness of breath with these complaints.   CARDIAC HISTORY:  Positive for hypertension and hyperlipidemia.  He  denies diabetes.  He is a smoker, has smoked a pack a day for 40 years.   PAST MEDICAL HISTORY:  Significant for question of mental illness  reported by the patient, no details of this or where he has been  hospitalized before.   SOCIAL AND FAMILY HISTORY:  [name] father died of old age at 44,  mother died in his childbirth.  He has 1 sister who is alive and he  lives with.  He is disabled.  Lives in [place] with his sister and  nephew.   REVIEW OF SYSTEMS:  Positive for chest pain,  dizziness, abdominal  discomfort, constipation.  Notes having some specks of blood in the  stool 2 weeks ago.   ALLERGIES:  None known.   At the time of admission, he was on no medications, but was loaded with  Plavix.   PHYSICAL EXAMINATION:  VITAL SIGNS:  [name] blood pressure  103/65, pulse 73, respiratory rate 100.  GENERAL:  He is awake and alert, has somewhat flat affect and unusual  speech pattern but he is able to communicate when asked questions and  appears to be reliable with his medical history though he does not have  a lot of details.  I do not appreciate any carotid bruits.  CARDIAC:  Regular rate and rhythm without murmur or gallop.  ABDOMEN:  Benign.  His abdomen is not distended or tender.  He does have  bowel sounds.  Cath site in the right groin is stable.  EXTREMITIES:  He appears to have adequate vein for bypass in both lower  extremities.  He has 2+ DP and PT pulses.   Laboratory findings are reviewed.  He did presented with elevated  troponins up to 15-17.  Cardiac catheterization film is reviewed.  The  circumflex is occluded half way down the vessel.  He has 70-80% stenosis  involving the LAD and right coronary artery.   IMPRESSION:  The patient with acute lateral wall infarction, depressed  left ventricular function, and three-vessel coronary artery disease who  presents to [place] for a consultation for a cardiac course and a candidate  for cardiac surgery, loaded with Plavix yesterday.  With the patient's  significant 3-vessel disease, I agree he is a candidate for coronary  artery bypass grafting, ideally we would like to wait 5-7 days both for  recovery from the acute infarct that was at least 36-48 hours of  duration at the time of presentation and also for washout of the Plavix.  I have discussed this with the patient who is agreeable.  Risks and  options including death, infection, stroke, myocardial infarction,  bleeding, blood transfusion, all  have been discussed.  We will plan to  proceed on May 25.      Sheliah Plane, MD  Electronically Signed     EG/MEDQ  D:  07/29/2008  T:  07/30/2008  Job:  [id number]   cc:   Arturo Morton. Riley Kill, MD, [facility]

## 2010-07-25 NOTE — Op Note (Signed)
NAME:  [name], [name]               ACCOUNT NO.:  [id number]   MEDICAL RECORD NO.:  [id number]           PATIENT TYPE:   LOCATION:                                 FACILITY:   PHYSICIAN:  Sheliah Plane, MD    DATE OF BIRTH:  17-Jan-1951   DATE OF PROCEDURE:  08/03/2008  DATE OF DISCHARGE:                               OPERATIVE REPORT   PREOPERATIVE DIAGNOSES:  Recent lateral wall myocardial infarction with  three-vessel coronary occlusive disease.   POSTOPERATIVE DIAGNOSES:  Recent lateral wall myocardial infarction with  three-vessel coronary occlusive disease.   SURGICAL PROCEDURES:  Coronary artery bypass grafting x5 with left  internal mammary to the left anterior descending coronary artery,  reverse saphenous vein graft to the first diagonal coronary artery,  sequential reverse saphenous vein graft to the first obtuse marginal and  distal circumflex, reverse saphenous vein graft to the posterior  descending coronary artery with attempted endovein harvest.   SURGEON:  Sheliah Plane, MD   FIRST ASSISTANT:  Rowe Clack, PA-C   BRIEF HISTORY:  The patient is a 60 year old male who presented 5 days  previous with acute myocardial infarction, probably out of hospital with  stuttering pain over several days.  Prior to admission, he was loaded  with Plavix and underwent diagnostic angiography by Dr. Riley Kill and  found to have occlusion of the distal circumflex with three-vessel  coronary occlusive disease.  He stabilized medically and after a period  of washout of Plavix, coronary artery bypass grafting was recommended.  The patient agreed and signed informed consent.   DESCRIPTION OF PROCEDURE:  With Swan-Ganz and arterial line monitors in  place, the patient underwent general endotracheal anesthesia without  incidence.  Skin of chest and legs was prepped with Betadine and draped  in the usual sterile manner.  Initially, attempt at endovein harvest in  the right leg was  started with a small incision.  A vein was located,  but because of the patient's very thin cachectic body habitus, the vein  was very closely adherent to the skin and making it difficult to harvest  endoscopically.  Because of this, it was decided to abandon the  attempted endovein harvesting and segments of vein were harvested openly  from the left ankle to the mid left thigh.  The vein was of good quality  and caliber.  Median sternotomy was performed.  Left internal mammary  artery was dissected down as a pedicle graft.  The distal artery was  divided, had good free flow.  Pericardium was opened.  Overall,  ventricular function appeared preserved.  The patient did have obvious  evidence of lateral wall infarction.  He was systemically heparinized.  Ascending aorta was cannulated.  The right atrium was cannulated.  An  aortic root vent cardioplegia needle was introduced into the ascending  aorta.  The patient was placed on cardiopulmonary bypass 2.4  L/minute/meter square.  Sites of anastomosis were selected and dissected  at the epicardium.  [name] body temperature was cooled to 30  degrees.  Aortic crossclamp was applied and 500 mL of  cold blood  potassium cardioplegia was administered with diastolic arrest of the  heart.  Myocardial septal temperature was monitored throughout the  crossclamp.  Attention was turned first to the lateral wall of the  heart.  The first obtuse marginal was intramyocardial.  This vessel was  opened and was approximately 1.2-1.3 mm in size.  Using a diamond-type  side-to-side anastomosis, this was carried out.  Distal segment of the  same vein was then carried to the occluded distal circumflex vessel,  which was significantly thickened, diffusely diseased.  The vessel was  opened and admitted to 1-mm probe distally.  The vessel was 1.3-1.4 mm  in size.  Using a running 7-0 Prolene, distal anastomosis was performed.  Attention was then turned to the  first diagonal coronary artery, which  was opened and was approximately 1.3 mm in size.  Using a running 7-0  Prolene, distal anastomosis was performed with second reverse saphenous  vein graft.  The proximal posterior descending coronary artery was  opened and admitted to 1.5 mm probe distally.  Using a running 7-0  Prolene, distal anastomosis was performed.  Additional cold blood  cardioplegia was intermittently administered down the vein grafts and  also into the aortic root.  The distal LAD was opened and admitted to  1.5-mm probe distally.  Using a running 8-0 Prolene, left internal  mammary artery was anastomosed to left anterior descending coronary  artery.  With release of the bulldog on the mammary artery, there was  rise in myocardial septal temperature.  Bulldog was placed back on the  mammary artery.  Additional cold blood cardioplegia was administered  with the crossclamp still in place.  Three punch aortotomies were  performed.  Each of the three vein grafts were anastomosed to the  ascending aorta.  Air was evacuated from the grafts.  Aortic crossclamp  was removed.  Total crossclamp time was 97 minutes.  The patient  spontaneously converted to a sinus rhythm, ultimately required a  atrially pacing to increase rate.  With the body temperature rewarmed to  37 degrees, he was then ventilated and weaned from cardiopulmonary  bypass without difficulty.  Total clamp time was 189 minutes.  He  remained hemodynamically stable.  He was decannulated in usual fashion.  Protamine sulfate was administered.  With operative field hemostatic, a  left pleural tube was left in place.  Pericardium was loosely  reapproximated.  A Blake mediastinal drain was left in the mediastinum.  The sternum was then closed with #6 stainless steel wire.  Fascia closed  with interrupted 0 Vicryl, running 3-0 Vicryl in subcutaneous tissue, 4-  0 subcuticular stitch in skin edges.  Dry dressings were  applied.  Sponge and needle count was reported as correct at the completion of the  procedure.  The patient tolerated the procedure without obvious  complication, was transferred to surgical intensive care unit for  further postoperative care.      Sheliah Plane, MD  Electronically Signed     EG/MEDQ  D:  08/04/2008  T:  08/05/2008  Job:  [id number]   cc:   Arturo Morton. Riley Kill, MD, [facility]

## 2010-07-25 NOTE — Discharge Summary (Signed)
NAMESHERVIN, CYPERT               ACCOUNT NO.:  [id number]   MEDICAL RECORD NO.:  [id number]          PATIENT TYPE:  INP   LOCATION:  2027                         FACILITY:  [facility]   PHYSICIAN:  Sheliah Plane, MD    DATE OF BIRTH:  11-20-1950   DATE OF ADMISSION:  07/28/2008  DATE OF DISCHARGE:  08/07/2008                               DISCHARGE SUMMARY   PRIMARY ADMITTING DIAGNOSIS:  Abdominal pain and chest pain.   ADDITIONAL/DISCHARGE DIAGNOSES:  1. Severe three-vessel coronary artery disease.  2. Acute lateral wall myocardial infarction.  3. Decreased left ventricular function.  4. Mild constipation/ileus.  5. Hypertension.  6. Hyperlipidemia.  7. History of tobacco abuse.  8. Questionable history of mental illness, although specific diagnosis      is unclear.   PROCEDURES PERFORMED:  1. Cardiac catheterization.  2. Coronary artery bypass grafting x5 (left internal mammary artery to      the left anterior descending, saphenous vein graft to the first      diagonal, sequential saphenous vein graft to the first obtuse      marginal in the distal circumflex, and saphenous vein graft to      posterior descending coronary).  3. Attempted endoscopic vein harvest with open saphenous vein harvest      left leg.   HISTORY:  The patient is a 60 year old male who presented to the  emergency department at [facility] on the date of this  admission complaining of abdominal discomfort, dizziness, and feeling  off balance.  Apparently, he had not had a bowel movement several days  and took some laxatives which resulted in small liquid stool as  abdominal discomfort.  Upon arrival to the emergency department, he was  began complaining of chest discomfort which prompted further lab work  including cardiac enzymes.  He was noted to have an elevated troponin  and was subsequently transferred to [facility] for further  evaluation.  He was seen by Cardiology and at that  point his cardiac  enzymes were elevated with T-wave inversions in the inferior leads on  his EKG.  Because of these findings, it was felt he should be admitted  for further evaluation and treatment.   HOSPITAL COURSE:  Mr. [name] was admitted under the care of [facility].  He underwent a 2-D echocardiogram which showed a large  posterolateral wall abnormality which was felt to require urgent cardiac  catheterization.  This was performed on Jul 28, 2008 by Dr. Riley Kill.  The patient was found to have severe three-vessel coronary artery  disease with moderate left ventricular dysfunction.  He was loaded with  Plavix and was felt that he should be evaluated by Cardiac Surgery for  potential revascularization.  Dr. Tyrone Sage saw the patient and reviewed  his films and agreed that he would benefit from CABG.  It was felt that  a waiting period should be undertaken for recovery from the acute  infarct and for suitable Plavix washout time.  He explained all risks,  benefits, and alternatives of surgery to the patient and he agreed to  proceed.  In the interim, he underwent GI workup for positive ileus on x-  ray with Hemoccult-positive stool.  A CT scan was performed which showed  no colonic lesions with evidence of benign prostatic hypertrophy.  A  small abdominal aortic aneurysm measuring 2.9 x 2.8 cm and bilateral  common iliac artery aneurysms, left measuring 2.5 cm diameter and right  at 1.9.  It was felt that no further GI evaluation was required at this  point.  He was cleared to proceed with surgery from GI standpoint.  He  remained stable and pain-free on heparin.  His carotid Dopplers showed  no ICA stenosis and ABIs were within normal limits bilaterally.  He was  taken to the operating room on Aug 03, 2008 and underwent CABG x5 as  described above.  Please see previously dictated operative report for  complete details of surgery.  He tolerated the procedure well and was   transferred to the SICU in stable condition.  He was able to be  extubated shortly after surgery.  He was hemodynamically stable and  doing well on postop day #1.  He was kept in the unit for further  evaluation.  By postop day #2, his chest tubes and lines were out and he  was ready for transfer to the Step-Down Unit.  Overall, his  postoperative course has been uneventful.  He is tolerating a regular  diet without problems.  He is having normal bowel and bladder function.  His incisions are all healing well.  He has remained afebrile and his  vital signs have been stable.  His chest x-ray shows no evidence of  pneumothorax.  Labs on postop day #3 show sodium 136, potassium 3.8, BUN  14, and creatinine 0.81.  White count 9.5, hemoglobin 9.7, hematocrit  28.9, and platelets 239.  LFTs showed SGOT 51, SGPT 45, alkaline  phosphatase of 163 with total direct and indirect bilirubin all within  normal limits.  It is anticipated if he remained stable over the next 24-  48 hours, he will hopefully be ready for discharge home, possibly on Aug 07, 2008.   DISCHARGE MEDICATIONS:  1. Enteric-coated aspirin 325 mg daily.  2. Toprol-XL 25 mg daily.  3. Lisinopril 2.5 mg daily.  4. Crestor 10 mg at bedtime.  5. Folic acid 1 mg daily.  6. Tylox one to two q.4 h. p.r.n. for pain.   DISCHARGE INSTRUCTIONS:  He is asked to refrain from driving, heavy  lifting, or strenuous activity.  He may continue ambulating daily and  using his incentive spirometer.  He may shower daily and clean his  incisions with soap and water.  He will continue on a low-fat, low-  sodium diet.   DISCHARGE FOLLOWUP:  He will need to make an appointment to see Dr. Myrtis Ser  in 2 weeks.  He will then see Dr. Tyrone Sage in 3 weeks with a chest x-  ray.  If he experiences any problems or has questions in the interim, he  is asked to contact our office.      Coral Ceo, P.A.      Sheliah Plane, MD  Electronically  Signed    GC/MEDQ  D:  08/06/2008  T:  08/06/2008  Job:  [id number]   cc:   Luis Abed, MD, [facility]  Hedwig Morton. Juanda Chance, MD

## 2010-07-28 ENCOUNTER — Other Ambulatory Visit: Payer: Self-pay | Admitting: *Deleted

## 2010-07-28 MED ORDER — METOPROLOL SUCCINATE ER 25 MG PO TB24: 25.0000 mg | ORAL_TABLET | Freq: Every day | ORAL | Status: DC

## 2011-01-15 ENCOUNTER — Encounter (HOSPITAL_COMMUNITY): Payer: Self-pay | Admitting: *Deleted

## 2011-01-15 ENCOUNTER — Inpatient Hospital Stay (HOSPITAL_COMMUNITY)
Admission: EM | Admit: 2011-01-15 | Discharge: 2011-01-18 | DRG: 603 | Disposition: A | Discharge status: Home Health | Payer: Medicare Other | Attending: Internal Medicine | Admitting: Internal Medicine

## 2011-01-15 ENCOUNTER — Emergency Department (HOSPITAL_COMMUNITY): Payer: Medicare Other

## 2011-01-15 DIAGNOSIS — L03011 Cellulitis of right finger: Secondary | ICD-10-CM

## 2011-01-15 DIAGNOSIS — W269XXA Contact with unspecified sharp object(s), initial encounter: Secondary | ICD-10-CM | POA: Diagnosis present

## 2011-01-15 DIAGNOSIS — Y92009 Unspecified place in unspecified non-institutional (private) residence as the place of occurrence of the external cause: Secondary | ICD-10-CM

## 2011-01-15 DIAGNOSIS — S61209A Unspecified open wound of unspecified finger without damage to nail, initial encounter: Secondary | ICD-10-CM | POA: Diagnosis present

## 2011-01-15 DIAGNOSIS — I2510 Atherosclerotic heart disease of native coronary artery without angina pectoris: Secondary | ICD-10-CM | POA: Diagnosis present

## 2011-01-15 DIAGNOSIS — F17200 Nicotine dependence, unspecified, uncomplicated: Secondary | ICD-10-CM | POA: Diagnosis present

## 2011-01-15 DIAGNOSIS — L0390 Cellulitis, unspecified: Secondary | ICD-10-CM | POA: Diagnosis present

## 2011-01-15 DIAGNOSIS — L02519 Cutaneous abscess of unspecified hand: Principal | ICD-10-CM | POA: Diagnosis present

## 2011-01-15 DIAGNOSIS — L03019 Cellulitis of unspecified finger: Principal | ICD-10-CM | POA: Diagnosis present

## 2011-01-15 DIAGNOSIS — I10 Essential (primary) hypertension: Secondary | ICD-10-CM | POA: Diagnosis present

## 2011-01-15 DIAGNOSIS — E785 Hyperlipidemia, unspecified: Secondary | ICD-10-CM | POA: Diagnosis present

## 2011-01-15 HISTORY — DX: Essential (primary) hypertension: I10

## 2011-01-15 HISTORY — DX: Hyperlipidemia, unspecified: E78.5

## 2011-01-15 LAB — POCT I-STAT, CHEM 8
Calcium, Ion: 1.16 mmol/L (ref 1.12–1.32)
Chloride: 105 mEq/L (ref 96–112)
Glucose, Bld: 90 mg/dL (ref 70–99)
HCT: 43 % (ref 39.0–52.0)
Hemoglobin: 14.6 g/dL (ref 13.0–17.0)
Potassium: 4.1 mEq/L (ref 3.5–5.1)

## 2011-01-15 LAB — CBC
Hemoglobin: 13.1 g/dL (ref 13.0–17.0)
MCH: 30.8 pg (ref 26.0–34.0)
MCHC: 33.2 g/dL (ref 30.0–36.0)
RDW: 14 % (ref 11.5–15.5)

## 2011-01-15 LAB — DIFFERENTIAL
Basophils Absolute: 0 10*3/uL (ref 0.0–0.1)
Basophils Relative: 0 % (ref 0–1)
Eosinophils Absolute: 0.2 10*3/uL (ref 0.0–0.7)
Monocytes Relative: 9 % (ref 3–12)
Neutro Abs: 4.6 10*3/uL (ref 1.7–7.7)
Neutrophils Relative %: 61 % (ref 43–77)

## 2011-01-15 MED ORDER — TETANUS-DIPHTH-ACELL PERTUSSIS 5-2.5-18.5 LF-MCG/0.5 IM SUSP
0.5000 mL | Freq: Once | INTRAMUSCULAR | Status: AC
  Administered 2011-01-15: 0.5 mL via INTRAMUSCULAR
  Filled 2011-01-15: qty 0.5

## 2011-01-15 MED ORDER — CLINDAMYCIN PHOSPHATE 600 MG/50ML IV SOLN
600.0000 mg | Freq: Three times a day (TID) | INTRAVENOUS | Status: DC
  Administered 2011-01-15 – 2011-01-17 (×6): 600 mg via INTRAVENOUS
  Filled 2011-01-15 (×8): qty 50

## 2011-01-15 NOTE — ED Notes (Signed)
Note patient assessed on arrival to room but RN was unable to sign into epic.

## 2011-01-15 NOTE — ED Notes (Signed)
Pt c/o pain, swelling since Thursday to R middle finger.

## 2011-01-15 NOTE — ED Notes (Signed)
Patient continues to wait for room assignment.  Dozing at [name]

## 2011-01-15 NOTE — ED Provider Notes (Signed)
History     CSN: [id number] Arrival date & time: 01/15/2011  2:25 PM   First MD Initiated Contact with Patient 01/15/11 1615     HPI Patient is a 60 year old male complaining of right third middle finger infection. States he cut his finger while washing dishes on a piece of glass. Reports this occurred 4 days ago. States since then his finger has become extremely swollen, erythematous, and has purulent drainage from laceration site. Also associated with subjective fevers.  Reports pain aggravated with movements of finger palpation  Past Medical History  Diagnosis Date  . Coronary artery disease   . Hypertension     Past Surgical History  Procedure Date  . Coronary artery bypass graft     No family history on file.  History  Substance Use Topics  . Smoking status: Current Everyday Smoker  . Smokeless tobacco: Not on file  . Alcohol Use: No      Review of Systems  Skin:       Positive for right finger edema, erythema, laceration, purulent drainage  All other systems reviewed and are negative.    Allergies  Review of patient's allergies indicates no known allergies.  Home Medications   Current Outpatient Rx  Name Route Sig Dispense Refill  . NON FORMULARY  Cholesterol, BP meds     . OMEPRAZOLE 10 MG PO CPDR Oral Take 10 mg by mouth daily.      [name] METOPROLOL SUCCINATE 25 MG PO TB24 Oral Take 1 tablet (25 mg total) by mouth daily. 30 tablet 6    BP 159/96  Pulse 74  Temp(Src) 98.7 F (37.1 C) (Oral)  Resp 18  SpO2 99%  Physical Exam  Constitutional: He is oriented to person, place, and time. He appears well-developed and well-nourished.  HENT:  Head: Normocephalic and atraumatic.  Eyes: Pupils are equal, round, and reactive to light.  Musculoskeletal:       Right third finger: A distal phalanx is erythematous with a avulsion laceration. Laceration is purulent drainage. Entire finger is edematous, erythematous. Patient unable to move finger to do extensive  swelling.  Moderately tender.  Neurological: He is alert and oriented to person, place, and time.  Skin: Skin is warm and dry. No rash noted. No erythema. No pallor.  Psychiatric: He has a normal mood and affect. His behavior is normal.    ED Course  Procedures (including critical care time)   Results for orders placed during the hospital encounter of 01/15/11  CBC      Component Value Range   WBC 7.6  4.0 - 10.5 (K/uL)   RBC 4.26  4.22 - 5.81 (MIL/uL)   Hemoglobin 13.1  13.0 - 17.0 (g/dL)   HCT 78.2  95.6 - 21.3 (%)   MCV 92.5  78.0 - 100.0 (fL)   MCH 30.8  26.0 - 34.0 (pg)   MCHC 33.2  30.0 - 36.0 (g/dL)   RDW 08.6  57.8 - 46.9 (%)   Platelets 216  150 - 400 (K/uL)  DIFFERENTIAL      Component Value Range   Neutrophils Relative 61  43 - 77 (%)   Neutro Abs 4.6  1.7 - 7.7 (K/uL)   Lymphocytes Relative 28  12 - 46 (%)   Lymphs Abs 2.1  0.7 - 4.0 (K/uL)   Monocytes Relative 9  3 - 12 (%)   Monocytes Absolute 0.7  0.1 - 1.0 (K/uL)   Eosinophils Relative 3  0 - 5 (%)  Eosinophils Absolute 0.2  0.0 - 0.7 (K/uL)   Basophils Relative 0  0 - 1 (%)   Basophils Absolute 0.0  0.0 - 0.1 (K/uL)  POCT I-STAT, CHEM 8      Component Value Range   Sodium 137  135 - 145 (mEq/L)   Potassium 4.1  3.5 - 5.1 (mEq/L)   Chloride 105  96 - 112 (mEq/L)   BUN 19  6 - 23 (mg/dL)   Creatinine, Ser 0.45  0.50 - 1.35 (mg/dL)   Glucose, Bld 90  70 - 99 (mg/dL)   Calcium, Ion 4.09  8.11 - 1.32 (mmol/L)   TCO2 25  0 - 100 (mmol/L)   Hemoglobin 14.6  13.0 - 17.0 (g/dL)   HCT 91.4  78.2 - 95.6 (%)    Results for orders placed during the hospital encounter of 01/15/11  CBC      Component Value Range   WBC 7.6  4.0 - 10.5 (K/uL)   RBC 4.26  4.22 - 5.81 (MIL/uL)   Hemoglobin 13.1  13.0 - 17.0 (g/dL)   HCT 21.3  08.6 - 57.8 (%)   MCV 92.5  78.0 - 100.0 (fL)   MCH 30.8  26.0 - 34.0 (pg)   MCHC 33.2  30.0 - 36.0 (g/dL)   RDW 46.9  62.9 - 52.8 (%)   Platelets 216  150 - 400 (K/uL)  DIFFERENTIAL       Component Value Range   Neutrophils Relative 61  43 - 77 (%)   Neutro Abs 4.6  1.7 - 7.7 (K/uL)   Lymphocytes Relative 28  12 - 46 (%)   Lymphs Abs 2.1  0.7 - 4.0 (K/uL)   Monocytes Relative 9  3 - 12 (%)   Monocytes Absolute 0.7  0.1 - 1.0 (K/uL)   Eosinophils Relative 3  0 - 5 (%)   Eosinophils Absolute 0.2  0.0 - 0.7 (K/uL)   Basophils Relative 0  0 - 1 (%)   Basophils Absolute 0.0  0.0 - 0.1 (K/uL)  POCT I-STAT, CHEM 8      Component Value Range   Sodium 137  135 - 145 (mEq/L)   Potassium 4.1  3.5 - 5.1 (mEq/L)   Chloride 105  96 - 112 (mEq/L)   BUN 19  6 - 23 (mg/dL)   Creatinine, Ser 4.13  0.50 - 1.35 (mg/dL)   Glucose, Bld 90  70 - 99 (mg/dL)   Calcium, Ion 2.44  0.10 - 1.32 (mmol/L)   TCO2 25  0 - 100 (mmol/L)   Hemoglobin 14.6  13.0 - 17.0 (g/dL)   HCT 27.2  53.6 - 64.4 (%)   Dg Finger Middle Right  01/15/2011  *RADIOLOGY REPORT*  Clinical Data: Diffuse cellulitis of the right finger  RIGHT MIDDLE FINGER 2+V  Comparison: None.  Findings: There is apparent soft tissue defect involving the soft tissues of the dorsal surface of the PIP joint of the third (middle) digit of the right hand.  This finding is without associated radiopaque foreign body or subcutaneous emphysema.  No definite osteolysis to suggest osteomyelitis.  No fracture.  Joint spaces are preserved.  IMPRESSION: Soft tissue defect within the dorsal surface of the third digit without radiopaque foreign body or definite evidence of osteomyelitis.  Original Report Authenticated By: Waynard Reeds, M.D.    7:22 PM Spoke with Dr. Melvyn Novas for hand surgery consult. Will have patient admitted to internal medicine for IV antibiotics.  8:41 PM Spoke with Dr  Crosley, [facility] hospitalist. She will continue patient for admission. Does not want holding orders currently. MDM          Thomasene Lot, PA 01/15/11 2042

## 2011-01-15 NOTE — ED Notes (Signed)
clidamycin would not scan at time of hanging

## 2011-01-15 NOTE — ED Notes (Signed)
pharmacy called for cleosin that was due at 2200 and has not arrived

## 2011-01-15 NOTE — H&P (Signed)
PCP:   Shawnie Pons, MD, MD   Chief Complaint: cellulitis  HPI: Patient is a poor historian. This is a 60 y/o male who cut his finger last Thursday while washing dishes. It has continued to get more swollen, infected and draining. He has developed increasing pain. He is currently unable to bend the fingers. He has a psych history which he is not able to quantify, his baseline mentation limits his ability to give a more detailed history. He presents to the ER. Family is not at bedside. He lives at home with his sister.  Review of Systems: (positive bolded) Very difficult to obtain but as best able all 10 point systems reviewed and negative except as noted in HPI   Past Medical History: Past Medical History  Diagnosis Date  . Coronary artery disease   . Hypertension   . Hyperlipidemia    Past Surgical History  Procedure Date  . Coronary artery bypass graft     Medications: Prior to Admission medications   Medication Sig Start Date End Date Taking? Authorizing Provider  aspirin 81 MG tablet Take 81 mg by mouth daily.     Yes Historical Provider, MD  metoprolol succinate (TOPROL XL) 25 MG 24 hr tablet Take 1 tablet (25 mg total) by mouth daily. 07/28/10 07/28/11 Yes Luis Abed, MD  omeprazole (PRILOSEC) 10 MG capsule Take 10 mg by mouth daily.     Yes Historical Provider, MD  rosuvastatin (CRESTOR) 10 MG tablet Take 10 mg by mouth daily.     Yes Historical Provider, MD    Allergies:  No Known Allergies  Social History:  reports that he has been smoking.  He does not have any smokeless tobacco history on file. He reports that he does not drink alcohol or use illicit drugs.  Family History: Patient denies.  Physical Exam: Filed Vitals:   01/15/11 1439 01/15/11 1922  BP: 159/96 147/95  Pulse: 74 85  Temp: 98.7 F (37.1 C) 99.8 F (37.7 C)  TempSrc: Oral Oral  Resp: 18 20  SpO2: 99% 97%    General:  Alert and oriented times three, well developed and nourished, no  acute distress Eyes: PERRLA, pink conjunctiva, scleral icterus ENT: Moist oral mucosa, neck supple, no thyromegaly Lungs: clear to ascultation, no wheeze, no crackles, no use of accessory muscles Cardiovascular: regular rate and rhythm, no regurgitation, no gallops, no murmurs. No carotid bruits, no JVD Abdomen: soft, positive BS, non-tender, none distended, no organomegaly, not an acute abdomen GU: not examined Neuro: CN II - XII grossly intact, sensation intact Musculoskeletal: strength 5/5 all extremities, no clubbing, cyanosis or edema, right middle finger swollen, decreased ROM, open wound cellulitic area Skin: no rash, no subcutaneous crepitation, no decubitus Psych: psych baseline, answers questions alert and oriented times three but limited   Labs on Admission:   [facility] 01/15/11 1843  NA 137  K 4.1  CL 105  CO2 --  GLUCOSE 90  BUN 19  CREATININE 1.00  CALCIUM --  MG --  PHOS --   No results found for this basename: AST:2,ALT:2,ALKPHOS:2,BILITOT:2,PROT:2,ALBUMIN:2 in the last 72 hours No results found for this basename: LIPASE:2,AMYLASE:2 in the last 72 hours  Basename 01/15/11 1843 01/15/11 1735  WBC -- 7.6  NEUTROABS -- 4.6  HGB 14.6 13.1  HCT 43.0 39.4  MCV -- 92.5  PLT -- 216   No results found for this basename: CKTOTAL:3,CKMB:3,CKMBINDEX:3,TROPONINI:3 in the last 72 hours No results found for this basename: TSH,T4TOTAL,FREET3,T3FREE,THYROIDAB in the  last 72 hours No results found for this basename: VITAMINB12:2,FOLATE:2,FERRITIN:2,TIBC:2,IRON:2,RETICCTPCT:2 in the last 72 hours  Radiological Exams on Admission: Dg Finger Middle Right  01/15/2011  *RADIOLOGY REPORT*  Clinical Data: Diffuse cellulitis of the right finger  RIGHT MIDDLE FINGER 2+V  Comparison: None.  Findings: There is apparent soft tissue defect involving the soft tissues of the dorsal surface of the PIP joint of the third (middle) digit of the right hand.  This finding is without associated  radiopaque foreign body or subcutaneous emphysema.  No definite osteolysis to suggest osteomyelitis.  No fracture.  Joint spaces are preserved.  IMPRESSION: Soft tissue defect within the dorsal surface of the third digit without radiopaque foreign body or definite evidence of osteomyelitis.  Original Report Authenticated By: Waynard Reeds, M.D.    Assessment/Plan Present on Admission:  Cellulitis right index finger -admit Hand surgeon Dr Orlan Leavens consulted by ER -start IV antibiotics .HYPERLIPIDEMIA -stable resume home meds .TOBACCO ABUSE -nicotine patch .HYPERTENSION .CAD -stable, resume home meds  Code status: full code DVT/GI prophylaxis   Salvatore Poe 01/15/2011, 9:02 PM

## 2011-01-16 ENCOUNTER — Encounter (HOSPITAL_COMMUNITY): Payer: Self-pay | Admitting: *Deleted

## 2011-01-16 MED ORDER — PANTOPRAZOLE SODIUM 40 MG PO TBEC
40.0000 mg | DELAYED_RELEASE_TABLET | Freq: Every day | ORAL | Status: DC
  Administered 2011-01-16 – 2011-01-18 (×3): 40 mg via ORAL
  Filled 2011-01-16 (×3): qty 1

## 2011-01-16 MED ORDER — METOPROLOL SUCCINATE ER 25 MG PO TB24
25.0000 mg | ORAL_TABLET | Freq: Every day | ORAL | Status: DC
  Administered 2011-01-16 – 2011-01-18 (×3): 25 mg via ORAL
  Filled 2011-01-16 (×3): qty 1

## 2011-01-16 MED ORDER — ROSUVASTATIN CALCIUM 10 MG PO TABS
10.0000 mg | ORAL_TABLET | Freq: Every day | ORAL | Status: DC
  Administered 2011-01-16 – 2011-01-17 (×2): 10 mg via ORAL
  Filled 2011-01-16 (×4): qty 1

## 2011-01-16 MED ORDER — ENOXAPARIN SODIUM 40 MG/0.4ML ~~LOC~~ SOLN
40.0000 mg | SUBCUTANEOUS | Status: DC
  Administered 2011-01-16 – 2011-01-18 (×3): 40 mg via SUBCUTANEOUS
  Filled 2011-01-16 (×3): qty 0.4

## 2011-01-16 NOTE — Progress Notes (Signed)
Nutrition Health History Consult  Pt reports eating 3 meals plus snacks daily PTA and denies any changes in weight or appetite. Pt's BMI is 16, meeting criteria for being underweight, however pt states his usual body weight is 130 pounds and he has been a thin man all his life. Pt c/o some stomach pain but states that this is not preventing him from eating excellent. Pt currently on a heart healthy diet, eating 100% of meals per RN documentation. No nutrition diagnosis at this time. Pt denies wanting any nutritional supplements. No educational needs at this time. Will continue to monitor intake.   Dietitian # [phone]

## 2011-01-16 NOTE — Consult Note (Signed)
Full note dictated  [id number]  Right long finger laceration with cellulitis admitted for iv abx. Pt seen examined open wound with small amount of purulence No frank abscess. Mild swelling in digit, no sausage digit or evidence of tenosynovitis.  Recs: continue abx could transition to oral Will need OT/ whirlpool therapy and wound care for finger.  May need to get into wound care center for wound care management. May be easier to do when inpatient because in my experience difficult for outpatient Consult wound care staff for guidance.  Nursing can apply daily dressings Will need to f/u with me in office next week. Call office [phone] for f/u appt i will be out of office tomorrow pm through weekend. If worsens will need to contact hand surgeon on call as my partner to is out of town.

## 2011-01-16 NOTE — Progress Notes (Signed)
There were no VS orders. Paged on call to notify.  Orders received and will continue to monitor.

## 2011-01-16 NOTE — Consult Note (Signed)
NAMEMarland Kitchen  FREELAND, PRACHT NO.:  [id number]  MEDICAL RECORD NO.:  [id number]  LOCATION:  1313                         FACILITY:  [facility]  PHYSICIAN:  Madelynn Done, MD  DATE OF BIRTH:  1950/12/14  DATE OF CONSULTATION:  01/16/2011 DATE OF DISCHARGE:                                CONSULTATION   CHIEF COMPLAINT:  Right long finger infection.  CONSULTING PHYSICIAN:  [facility]  BRIEF HISTORY:  The patient is a poor historian, a 60 year old male who cut his finger last Thursday while washing dishes.  It has continued to get more swollen.  He is admitted for IV antibiotics because of the worsening swelling and the infection in his finger.  I saw and evaluated the patient at the bedside.  PAST MEDICAL HISTORY: 1. Coronary artery disease. 2. Hypertension. 3. Hyperlipidemia.  PAST SURGICAL HISTORY:  Coronary artery bypass grafting.  MEDICATIONS:  See med list.  ALLERGIES:  No known drug allergies.  SOCIAL HISTORY:  He smokes.  He does not drink alcohol or use drugs.  PHYSICAL EXAMINATION:  VITAL SIGNS:  He has good hand coordination in his right hand. GENERAL:  Normal mood.  He is alert and oriented to person, place, and time, in no acute distress. EXTREMITIES:  The right hand, the patient does have an open wound over the dorsal aspect of the long finger, a small amount of purulence, no frank abscess collection.  No evidence of tenosynovitis.  He is able to make the ok sign, cross fingers, extend his thumb, extend his digits. Fingertips are warm and well perfused.  He has no open wounds in the index, ring, or small.  Good capillary refill.  Good blood flow to his hand, wrist, and digits.  No ascending erythema or lymphangitis.  RADIOGRAPHS:  Done on January 15, 2011, did show the soft tissue defect along the dorsal aspect of the long finger.  I did not see evidence of a foreign body.  IMPRESSION:  Right long finger cellulitis with open  wound.  PLAN:  Today, the findings were reviewed with the patient.  The patient, I do think, would benefit from an aggressive wound care therapy approach.  I think he can be transitioned to oral antibiotics and sent home as long as he is in outpatient whirlpool therapy program.  I think the outpatient wound care center would be great for this patient. Hopefully, this can be coordinated as an inpatient and he then be discharged.  Hopefully, he can get the wound care as an inpatient and as an outpatient.  The nurses can apply daily dressing just to keep the area covered.  He needs to follow up with me in the office in approximately 1 week.  I would be out of the office tomorrow through the weekend and if his symptoms worsen or the patient needs an operative intervention, the patient or the service will need to contact the hand surgeon on-call as my partner and myself are both out of town. The patient voiced understanding of the plan and hopefully this can be coordinated as an outpatient in getting him the appropriate oral antibiotics and wound care therapy here with service at  [facility].     Madelynn Done, MD     FWO/MEDQ  D:  01/16/2011  T:  01/16/2011  Job:  [id number]

## 2011-01-16 NOTE — ED Provider Notes (Signed)
Medical screening examination/treatment/procedure(s) were conducted as a shared visit with non-physician practitioner(s) and myself.  I personally evaluated the patient during the encounter  Pt with middle finger cellulitis and abscess after lac by glass 4 days prior.  Pt with sausage like digit with erythema, warmth, pus drainage.  Pt to receive IV antibiotics and hand consult for further evaluation and possible surgical I&D.    Nat Christen, MD 01/16/11 0010

## 2011-01-16 NOTE — Progress Notes (Signed)
Subjective: States right middle finger still swollen but not as painful. Denies chest pain no shortness of breath Objective: Vital signs in last 24 hours: Temp:  [97.4 F (36.3 C)-99.8 F (37.7 C)] 98.2 F (36.8 C) (11/06 1410) Pulse Rate:  [60-85] 71  (11/06 1410) Resp:  [16-20] 18  (11/06 1410) BP: (126-172)/(92-112) 157/92 mmHg (11/06 1410) SpO2:  [93 %-100 %] 93 % (11/06 1410) Weight:  [59.739 kg (131 lb 11.2 oz)] 131 lb 11.2 oz (59.739 kg) (11/06 0356) Last BM Date: 01/14/11 Intake/Output from previous day:   Intake/Output this shift: Total I/O In: 840 [P.O.:840] Out: 1200 [Urine:1200]    General Appearance:    Alert, cooperative, no distress, appears stated age  Lungs:     Clear to auscultation bilaterally, respirations unlabored   Heart:    Regular rate and rhythm, S1 and S2 normal, no murmur, rub   or gallop  Abdomen:     Soft, non-tender, bowel sounds active all four quadrants,    no masses, no organomegaly  Extremities:   Extremities right middle finger edematous -laceration with eschar adjacent to the DIP, tender to palpation,warm.  Neurologic:   CNII-XII intact, normal strength      Weight change:   Intake/Output Summary (Last 24 hours) at 01/16/11 1459 Last data filed at 01/16/11 1300  Gross per 24 hour  Intake    840 ml  Output   1200 ml  Net   -360 ml    Lab Results:   [facility] 01/15/11 1843  NA 137  K 4.1  CL 105  CO2 --  GLUCOSE 90  BUN 19  CREATININE 1.00  CALCIUM --    Basename 01/15/11 1843 01/15/11 1735  WBC -- 7.6  HGB 14.6 13.1  HCT 43.0 39.4  PLT -- 216  MCV -- 92.5   PT/INR No results found for this basename: LABPROT:2,INR:2 in the last 72 hours ABG No results found for this basename: PHART:2,PCO2:2,PO2:2,HCO3:2 in the last 72 hours  Micro Results: No results found for this or any previous visit (from the past 240 hour(s)). Studies/Results: Dg Finger Middle Right  01/15/2011  *RADIOLOGY REPORT*  Clinical Data: Diffuse  cellulitis of the right finger  RIGHT MIDDLE FINGER 2+V  Comparison: None.  Findings: There is apparent soft tissue defect involving the soft tissues of the dorsal surface of the PIP joint of the third (middle) digit of the right hand.  This finding is without associated radiopaque foreign body or subcutaneous emphysema.  No definite osteolysis to suggest osteomyelitis.  No fracture.  Joint spaces are preserved.  IMPRESSION: Soft tissue defect within the dorsal surface of the third digit without radiopaque foreign body or definite evidence of osteomyelitis.  Original Report Authenticated By: Waynard Reeds, M.D.    Scheduled Meds:   . clindamycin (CLEOCIN) IV  600 mg Intravenous Q8H  . enoxaparin  40 mg Subcutaneous Q24H  . metoprolol succinate  25 mg Oral Daily  . pantoprazole  40 mg Oral Q1200  . rosuvastatin  10 mg Oral q1800  . TDaP  0.5 mL Intramuscular Once   Continuous Infusions:  PRN Meds:. Assessment/Plan:  1.Cellulitis- we'll continue IV antibiotics, await hand surgery recommendations-spoke with Dr. Orlan Leavens who is to see patient today. 2. Hypertension - continue crestor 3.Hyperlipidemia -continue Crestor  4.TOBACCO ABUSE - on nicotine patch  5.CAD -chest pain free, continue outpatient medications       LOS: 1 day   Wolfgang Finigan C 01/16/2011, 2:59 PM

## 2011-01-17 LAB — BASIC METABOLIC PANEL
BUN: 17 mg/dL (ref 6–23)
Calcium: 9.8 mg/dL (ref 8.4–10.5)
Creatinine, Ser: 1.05 mg/dL (ref 0.50–1.35)
GFR calc non Af Amer: 74 mL/min — ABNORMAL LOW (ref >90)
Glucose, Bld: 95 mg/dL (ref 70–99)

## 2011-01-17 LAB — CBC
HCT: 45.1 % (ref 39.0–52.0)
Hemoglobin: 15 g/dL (ref 13.0–17.0)
MCH: 31.3 pg (ref 26.0–34.0)
MCHC: 33.3 g/dL (ref 30.0–36.0)
MCV: 94.2 fL (ref 78.0–100.0)
RDW: 13.8 % (ref 11.5–15.5)

## 2011-01-17 MED ORDER — MORPHINE SULFATE 2 MG/ML IJ SOLN
1.0000 mg | INTRAMUSCULAR | Status: DC | PRN
  Administered 2011-01-17: 1 mg via INTRAVENOUS
  Filled 2011-01-17: qty 1

## 2011-01-17 MED ORDER — DOXYCYCLINE HYCLATE 100 MG PO TABS
100.0000 mg | ORAL_TABLET | Freq: Two times a day (BID) | ORAL | Status: DC
  Administered 2011-01-17 – 2011-01-18 (×3): 100 mg via ORAL
  Filled 2011-01-17 (×4): qty 1

## 2011-01-17 MED ORDER — VANCOMYCIN HCL 1000 MG IV SOLR
750.0000 mg | Freq: Two times a day (BID) | INTRAVENOUS | Status: DC
  Administered 2011-01-17: 750 mg via INTRAVENOUS
  Filled 2011-01-17 (×3): qty 750

## 2011-01-17 MED ORDER — ONDANSETRON HCL 4 MG/2ML IJ SOLN: 4.0000 mg | Freq: Four times a day (QID) | INTRAMUSCULAR | Status: DC | PRN

## 2011-01-17 MED ORDER — HYDROCODONE-ACETAMINOPHEN 5-325 MG PO TABS
1.0000 | ORAL_TABLET | ORAL | Status: DC | PRN
  Administered 2011-01-18: 1 via ORAL
  Filled 2011-01-17: qty 1

## 2011-01-17 MED ORDER — PANTOPRAZOLE SODIUM 40 MG PO TBEC: 40.0000 mg | DELAYED_RELEASE_TABLET | Freq: Every day | ORAL | Status: DC

## 2011-01-17 MED ORDER — DEXTROSE 5 % IV SOLN
1.0000 g | Freq: Every day | INTRAVENOUS | Status: DC
  Administered 2011-01-17: 1 g via INTRAVENOUS
  Filled 2011-01-17 (×2): qty 10

## 2011-01-17 MED ORDER — NICOTINE 14 MG/24HR TD PT24
14.0000 mg | MEDICATED_PATCH | Freq: Every day | TRANSDERMAL | Status: DC
  Administered 2011-01-17 – 2011-01-18 (×2): 14 mg via TRANSDERMAL
  Filled 2011-01-17 (×2): qty 1

## 2011-01-17 MED ORDER — ASPIRIN EC 81 MG PO TBEC
81.0000 mg | DELAYED_RELEASE_TABLET | Freq: Every day | ORAL | Status: DC
  Administered 2011-01-17 – 2011-01-18 (×2): 81 mg via ORAL
  Filled 2011-01-17 (×2): qty 1

## 2011-01-17 MED ORDER — SODIUM CHLORIDE 0.9 % IJ SOLN: 3.0000 mL | INTRAMUSCULAR | Status: DC | PRN

## 2011-01-17 MED ORDER — METOPROLOL SUCCINATE ER 25 MG PO TB24: 25.0000 mg | ORAL_TABLET | Freq: Every day | ORAL | Status: DC

## 2011-01-17 MED ORDER — ONDANSETRON HCL 4 MG PO TABS: 4.0000 mg | ORAL_TABLET | Freq: Four times a day (QID) | ORAL | Status: DC | PRN

## 2011-01-17 MED ORDER — AMOXICILLIN 500 MG PO CAPS
500.0000 mg | ORAL_CAPSULE | Freq: Three times a day (TID) | ORAL | Status: DC
  Administered 2011-01-17 – 2011-01-18 (×4): 500 mg via ORAL
  Filled 2011-01-17 (×6): qty 1

## 2011-01-17 MED ORDER — ROSUVASTATIN CALCIUM 10 MG PO TABS: 10.0000 mg | ORAL_TABLET | Freq: Every day | ORAL | Status: DC

## 2011-01-17 MED ORDER — CLINDAMYCIN PHOSPHATE 600 MG/50ML IV SOLN: 600.0000 mg | Freq: Three times a day (TID) | INTRAVENOUS | Status: DC

## 2011-01-17 NOTE — Consult Note (Signed)
WOC consult Note Reason for Consult:right hand, third digit necroticarea Wound type: unknown etiology, POA Measurement: 1.5cm x 1.0cm x .2cm Wound bed: 25% yellow slough, 75% pink granulation Drainage (amount, consistency, odor) light yellow Periwound:intact Dressing procedure/placement/frequency: Hydrotherapy x 2 days.  Follow treatment with ap[plication of hydrogel dressing and cover with 2x2, secure with tape.  Following hydrotherapy x2 days, may have nursing staff clean with NS and dress with hydrogel every Monday, Wednesday and Friday.  Conservative sharp wound debridement (CSWD performed at the bedside by PT. Recommend HHRN vs. Outpatient Wound Center for follow up for this patient.  If you agree, please order.

## 2011-01-17 NOTE — Progress Notes (Signed)
Physical Therapy Wound Treatment Patient Details  Name: [name] MRN: [id number] Date of Birth: 1950-11-14  Today's Date: 01/17/2011 Time:  1320- 1345    Subjective  Date of Onset:  (less than a week) Prior Treatments: none  Pain Score: Pain Score:   1  Wound Assessment  Wound 01/15/11 (Active)  Site / Wound Assessment Dry 01/17/2011  7:35 AM  % Wound base Red or Granulating 75% 01/17/2011  1:56 PM  % Wound base Yellow 25% 01/17/2011  1:56 PM  % Wound base Black 0% 01/17/2011  1:56 PM  % Wound base Other (Comment) 0% 01/17/2011  1:56 PM  Peri-wound Assessment Intact 01/17/2011  1:56 PM  Wound Length (cm) 1 cm 01/17/2011  1:56 PM  Wound Width (cm) 1.5 cm 01/17/2011  1:56 PM  Wound Depth (cm) 0.1 cm 01/17/2011  1:56 PM  Margins Unattacted edges (unapproximated) 01/17/2011  1:56 PM  Closure None 01/17/2011  1:56 PM  Drainage Amount None 01/17/2011  1:56 PM  Drainage Description Serous 01/16/2011  8:00 AM  Non-staged Wound Description Not applicable 01/16/2011  8:00 AM  Treatment Hydrotherapy (Whirlpool) 01/17/2011  1:56 PM  Dressing Type Hydrogel 01/17/2011  1:56 PM  Dressing Changed New 01/17/2011  1:56 PM     Wound 01/16/11 Abrasion(s);Laceration Finger (Comment which one) Distal Scabbed over (Active)   Hydrotherapy Whirlpool Therapy - Wound Location: right long finger Whirlpool Therapy - Minutes: 10 min   Wound Assessment and Plan  Wound Therapy - Assess/Plan/Recommendations Wound Therapy - Clinical Statement: healing lacearation at dorsal aspect of DIP of right long finger with attached skin flap  Wound Therapy - Functional Problem List: edema in finger with decreased ROM at DIP joint Hydrotherapy Plan: Whirlpool;Dressing change;Debridement;Patient/family education Wound Therapy - Frequency: Other (comment) (whirlpool x 2visits, then 3x per week) Wound Therapy - Follow Up Recommendations: Home health RN  Wound Therapy Goals- Improve the function of patient's integumentary system  by progressing the wound(s) through the phases of wound healing (inflammation - proliferation - remodeling) by: Increase Granulation Tissue to: 100% Increase Granulation Tissue - Progress: Not met Decrease Length/Width/Depth by (cm): decrease width by .5 cm Decrease Length/Width/Depth - Progress: Not met Patient/Family will be able to : demonstrate ROM exercisea Patient/Family Instruction Goal - Progress: Not met Goals/treatment plan/discharge plan were made with and agreed upon by patient/family: Yes Time For Goal Achievement: 7 days Wound Therapy - Potential for Goals: Good  Goals will be updated until maximal potential achieved or discharge criteria met.  Discharge criteria: when goals achieved, discharge from hospital, MD decision/surgical intervention, no progress towards goals, refusal/missing three consecutive treatments without notification or medical reason.  Ebony Hail [facility] 01/17/2011, 2:24 PM

## 2011-01-17 NOTE — Consult Note (Signed)
ANTIBIOTIC CONSULT NOTE - INITIAL  Pharmacy Consult for Vancomycin Indication: Finger infection  No Known Allergies  Patient Measurements: Height: 6\' 4"  (193 cm) Weight: 131 lb 11.2 oz (59.739 kg) IBW/kg (Calculated) : 86.8  Adjusted Body Weight:   Vital Signs: Temp: 98.4 F (36.9 C) (11/07 0220) Temp src: Oral (11/07 0220) BP: 130/90 mmHg (11/07 0220) Pulse Rate: 62  (11/07 0220) Intake/Output from previous day: 11/06 0701 - 11/07 0700 In: 1700 [P.O.:1410; IV Piggyback:290] Out: 2200 [Urine:2200] Intake/Output from this shift: Total I/O In: 410 [P.O.:220; IV Piggyback:190] Out: 1000 [Urine:1000]  Labs:  Basename 01/15/11 1843 01/15/11 1735  WBC -- 7.6  HGB 14.6 13.1  PLT -- 216  LABCREA -- --  CREATININE 1.00 --   Estimated Creatinine Clearance: 64.7 ml/min (by C-G formula based on Cr of 1). Microbiology: No results found for this or any previous visit (from the past 720 hour(s)).  Medical History: Past Medical History  Diagnosis Date  . Coronary artery disease   . Hypertension   . Hyperlipidemia      Assessment: Pt is admitted for laceration with cellulitis on finger.  Already on clindamyin, rocephin and now Vancomycin per Pharmacy.  No prior Vancomycin noted.  Goal of Therapy:  Vancomycin trough level 10-15 mcg/ml  Plan:  Vancomycin 750mg  iv q12hr  Darlina Guys, Jacquenette Shone Crowford 01/17/2011,4:56 AM

## 2011-01-17 NOTE — Progress Notes (Signed)
Subjective: Patient denies any complaints at this time. His finger on the right hand is much better. He doesn't have severe pain at this time.  Objective: Vital signs in last 24 hours: Temp:  [97.5 F (36.4 C)-98.6 F (37 C)] 98.6 F (37 C) (11/07 1038) Pulse Rate:  [59-85] 62  (11/07 1038) Resp:  [16-18] 16  (11/07 1038) BP: (120-157)/(79-97) 125/84 mmHg (11/07 1038) SpO2:  [93 %-98 %] 98 % (11/07 1038) Weight change:  Last BM Date: 01/14/11  Intake/Output from previous day: 11/06 0701 - 11/07 0700 In: 1752 [P.O.:1410; IV Piggyback:100] Out: 2200 [Urine:2200] Intake/Output this shift: Total I/O In: 720 [P.O.:720] Out: 275 [Urine:275]  Physical Exam  Vitals reviewed. Constitutional: He appears well-developed and well-nourished. No distress.  HENT:  Head: Normocephalic and atraumatic.  Nose: Nose normal.  Mouth/Throat: No oropharyngeal exudate.  Eyes: EOM are normal. Pupils are equal, round, and reactive to light. No scleral icterus.  Neck: Normal range of motion. Neck supple.  Cardiovascular: Normal rate, regular rhythm and normal heart sounds.  Exam reveals no friction rub.   No murmur heard. Pulmonary/Chest: Effort normal and breath sounds normal. No stridor. No respiratory distress. He has no rales.  Abdominal: Soft. He exhibits no distension and no mass. There is no tenderness. There is no rebound.  Musculoskeletal: Normal range of motion.       Hands: Neurological: He is alert.  Skin: Skin is warm.  Psychiatric: He has a normal mood and affect.     Lab Results:  Basename 01/17/11 0552 01/15/11 1843 01/15/11 1735  WBC 6.4 -- 7.6  HGB 15.0 14.6 --  HCT 45.1 43.0 --  PLT 178 -- 216   BMET  Basename 01/17/11 0552 01/15/11 1843  NA 133* 137  K 4.5 4.1  CL 100 105  CO2 26 --  GLUCOSE 95 90  BUN 17 19  CREATININE 1.05 1.00  CALCIUM 9.8 --    Results for orders placed during the hospital encounter of 01/15/11 (from the past 24 hour(s))  CBC      Status: Normal   Collection Time   01/17/11  5:52 AM      Component Value Range   WBC 6.4  4.0 - 10.5 (K/uL)   RBC 4.79  4.22 - 5.81 (MIL/uL)   Hemoglobin 15.0  13.0 - 17.0 (g/dL)   HCT 16.1  09.6 - 04.5 (%)   MCV 94.2  78.0 - 100.0 (fL)   MCH 31.3  26.0 - 34.0 (pg)   MCHC 33.3  30.0 - 36.0 (g/dL)   RDW 40.9  81.1 - 91.4 (%)   Platelets 178  150 - 400 (K/uL)  BASIC METABOLIC PANEL     Status: Abnormal   Collection Time   01/17/11  5:52 AM      Component Value Range   Sodium 133 (*) 135 - 145 (mEq/L)   Potassium 4.5  3.5 - 5.1 (mEq/L)   Chloride 100  96 - 112 (mEq/L)   CO2 26  19 - 32 (mEq/L)   Glucose, Bld 95  70 - 99 (mg/dL)   BUN 17  6 - 23 (mg/dL)   Creatinine, Ser 7.82  0.50 - 1.35 (mg/dL)   Calcium 9.8  8.4 - 95.6 (mg/dL)   GFR calc non Af Amer 74 (*) >90 (mL/min)   GFR calc Af Amer 86 (*) >90 (mL/min)    Studies/Results: Dg Finger Middle Right  01/15/2011  *RADIOLOGY REPORT*  Clinical Data: Diffuse cellulitis of the right finger  RIGHT MIDDLE FINGER 2+V  Comparison: None.  Findings: There is apparent soft tissue defect involving the soft tissues of the dorsal surface of the PIP joint of the third (middle) digit of the right hand.  This finding is without associated radiopaque foreign body or subcutaneous emphysema.  No definite osteolysis to suggest osteomyelitis.  No fracture.  Joint spaces are preserved.  IMPRESSION: Soft tissue defect within the dorsal surface of the third digit without radiopaque foreign body or definite evidence of osteomyelitis.  Original Report Authenticated By: Waynard Reeds, M.D.    Medications:  Scheduled:   . amoxicillin  500 mg Oral Q8H  . aspirin EC  81 mg Oral Daily  . doxycycline  100 mg Oral Q12H  . enoxaparin  40 mg Subcutaneous Q24H  . metoprolol succinate  25 mg Oral Daily  . nicotine  14 mg Transdermal Daily  . pantoprazole  40 mg Oral Q1200  . rosuvastatin  10 mg Oral q1800  . DISCONTD: cefTRIAXone (ROCEPHIN) IV  1 g Intravenous  Q0600  . DISCONTD: clindamycin (CLEOCIN) IV  600 mg Intravenous Q8H  . DISCONTD: clindamycin (CLEOCIN) IV  600 mg Intravenous Q8H  . DISCONTD: metoprolol succinate  25 mg Oral Daily  . DISCONTD: pantoprazole  40 mg Oral Q1200  . DISCONTD: rosuvastatin  10 mg Oral Daily  . DISCONTD: vancomycin  750 mg Intravenous Q12H      Principal Problem:  *Cellulitis Active Problems:  HYPERLIPIDEMIA  TOBACCO ABUSE  HYPERTENSION  CAD   Assessment/Plan: 1.Cellulitis- Change to Po abx. Wound care consult. For hydrotherapy today. Will d/w CM regarding OP wound care. 2. Hypertension - continue metoprolol 3.Hyperlipidemia -continue Crestor  4.TOBACCO ABUSE - on nicotine patch. Counseled regarding cessation. 5.CAD -Stable.  Possible discharge in AM.  Full Code    LOS: 2 days   Kenneith Stief 01/17/2011, 12:47 PM

## 2011-01-18 MED ORDER — AMOXICILLIN 500 MG PO CAPS: 500.0000 mg | ORAL_CAPSULE | Freq: Three times a day (TID) | ORAL | Status: AC

## 2011-01-18 MED ORDER — NICOTINE 14 MG/24HR TD PT24: 1.0000 | MEDICATED_PATCH | Freq: Every day | TRANSDERMAL | Status: AC

## 2011-01-18 MED ORDER — NICOTINE 14 MG/24HR TD PT24: 1.0000 | MEDICATED_PATCH | Freq: Every day | TRANSDERMAL | Status: DC

## 2011-01-18 MED ORDER — HYDROCODONE-ACETAMINOPHEN 5-325 MG PO TABS: 1.0000 | ORAL_TABLET | ORAL | Status: AC | PRN

## 2011-01-18 MED ORDER — DOXYCYCLINE HYCLATE 100 MG PO TABS: 100.0000 mg | ORAL_TABLET | Freq: Two times a day (BID) | ORAL | Status: AC

## 2011-01-18 MED ORDER — PNEUMOCOCCAL VAC POLYVALENT 25 MCG/0.5ML IJ INJ
0.5000 mL | INJECTION | INTRAMUSCULAR | Status: AC
  Administered 2011-01-18: 0.5 mL via INTRAMUSCULAR
  Filled 2011-01-18: qty 0.5

## 2011-01-18 NOTE — Progress Notes (Signed)
Physical Therapy Wound Treatment Patient Details  Name: [name] MRN: [id number] Date of Birth: 02/25/1951  Today's Date: 01/18/2011 Time: 08:35 - 09:05 Hydro visiy, whirlpool , dr  Pt with difficulty monitoring and dressing wound at home.  He will need HHRN for monitoring of wound and daily dressing change with hydrogel to keep wound bed moist and skin prep to protect surrounding skin from maceration and application of clean dressing. Dressing is at risk for getting wet because patient will use right hand for ADLs .  Patient needs help with follow up appointment at Dr. Phoebe Sharps office    Subjective  Subjective: "I'm cool" Patient and Family Stated Goals: pt. interested in going home  Pain Score: Pain Score:   3  Wound Assessment  Wound 01/15/11 (Active)  Site / Wound Assessment Granulation tissue 01/18/2011  9:21 AM  % Wound base Red or Granulating 75% 01/18/2011  9:21 AM  % Wound base Yellow 25% 01/18/2011  9:21 AM  % Wound base Black 0% 01/17/2011  1:56 PM  % Wound base Other (Comment) 0% 01/17/2011  1:56 PM  Peri-wound Assessment Maceration 01/18/2011  9:21 AM  Wound Length (cm) 1 cm 01/17/2011  1:56 PM  Wound Width (cm) 1.5 cm 01/17/2011  1:56 PM  Wound Depth (cm) 0.1 cm 01/17/2011  1:56 PM  Margins Unattacted edges (unapproximated) 01/18/2011  9:22 AM  Closure None 01/17/2011  1:56 PM  Drainage Amount Scant 01/18/2011  9:22 AM  Drainage Description Serous 01/18/2011  9:22 AM  Non-staged Wound Description Not applicable 01/16/2011  8:00 AM  Treatment Hydrotherapy (Whirlpool);Cleansed;Debridement (Selective) 01/18/2011  9:22 AM  Dressing Type Hydrogel 01/18/2011  9:22 AM  Dressing Changed Changed 01/18/2011  9:22 AM  Dressing Status Intact 01/18/2011  8:07 AM     Wound 01/16/11 Abrasion(s);Laceration Finger (Comment which one) Distal Scabbed over (Active)   Hydrotherapy Whirlpool Therapy - Wound Location: right long finger Whirlpool Therapy - Minutes: 10 min Selective  Debridement Selective Debridement - Location: right long finger Selective Debridement - Tools Used: Forceps;Scissors Selective Debridement - Tissue Removed: red yellow granular tissue   Wound Assessment and Plan  Wound Therapy - Assess/Plan/Recommendations Wound Therapy - Clinical Statement: wound progressing through stages of healing still needs close monitoring at d/c with HHRN. Daily dressing change with hydogel to keep wound bed moist and dry dressing to protect surrounding skin.  Improved ROM of DIP today Wound Therapy - Functional Problem List: open wound  Hydrotherapy Plan: Whirlpool;Debridement;Dressing change Wound Therapy - Frequency: 5X / week Wound Therapy - Follow Up Recommendations: Home health RN  Wound Therapy Goals- Improve the function of patient's integumentary system by progressing the wound(s) through the phases of wound healing (inflammation - proliferation - remodeling) by: Increase Granulation Tissue to: 100% Increase Granulation Tissue - Progress: Progressing toward goal Decrease Length/Width/Depth by (cm): decrease width by .5 cm Decrease Length/Width/Depth - Progress: Progressing toward goal Patient/Family will be able to : demonstrate ROM exercisea Patient/Family Instruction Goal - Progress: Met Goals/treatment plan/discharge plan were made with and agreed upon by patient/family: Yes Time For Goal Achievement: 7 days Wound Therapy - Potential for Goals: Good  Goals will be updated until maximal potential achieved or discharge criteria met.  Discharge criteria: when goals achieved, discharge from hospital, MD decision/surgical intervention, no progress towards goals, refusal/missing three consecutive treatments without notification or medical reason.  Ebony Hail [facility] 01/18/2011, 9:32 AM

## 2011-01-18 NOTE — Progress Notes (Signed)
[facility] CHOSEN FOR HH RN-WOUND CARE.INFORMED SPOUSE OF WOUND CARE OTPT APPT.

## 2011-01-18 NOTE — Progress Notes (Signed)
Will provide home health care provider list to patient. Noted recommended for HHRN-wound care. Will set up otpt wound care appt.

## 2011-01-18 NOTE — Progress Notes (Signed)
Pt coughing and red in the face. RT paged for treatment. Will continue to monitor pt throughout shift.  Earnest Conroy, RN 01/18/11 03:51

## 2011-01-18 NOTE — Progress Notes (Signed)
PNEUMONIA VACCINE GIVEN BUT CANT BE DOCUMENTED IN THE EPIC SINCE THE COMPUTER LOCKED  WHEN I WAS TRYING TO DOCUMENT ON THIS MED,NOTIFIED EPIC PERSONNEL IRA. PNEUMONIA VACCINE GIVEN TO LEFT ARM, PATIENT TOLERATED THE PROCEDURE,CARD GIVEN TO PATIENT THAT HE HAD PNEUMONIA VACCINE THIS AFTERNOON

## 2011-01-18 NOTE — Discharge Summary (Signed)
Physician Discharge Summary  Patient ID: SYLVAN SOOKDEO MRN: [id number] DOB/AGE: 07/13/1950 60 y.o.  Admit date: 01/15/2011 Discharge date: 01/18/2011  Admission Diagnoses: Cellulitis of the third finger on the right hand.  Discharge Diagnoses:  Principal Problem:  *Cellulitis Active Problems:  HYPERLIPIDEMIA  TOBACCO ABUSE  HYPERTENSION  CAD  Discharged Condition: good  Hospital Course: This is a 60 year old,  African American male, who presented to the hospital with complaints of pain and swelling of the third finger of his right hand. This occurred after he washed some dishes at home. Patient was found to have swelling as well as a finding suggestive of cellulitis. There was also an area where it looked like there might be small amount of pus. Dr. Melvyn Novas was consulted to see the patient. He recommended hydrotherapy. Wound care was also consulted. Hydrotherapy was provided by physical therapy. Patient was put on broad spectrum antibiotics initially, which was changed over to amoxicillin and doxycycline yesterday. Patient has remained afebrile. His white count has been normal. Other medical issues about in stable.  Patient is to followup with Dr. Melvyn Novas next week. Phone number has been provided to him.  He is also prescribed a nicotine patch. Smoking cessation counseling was provided.  Consults: orthopedic surgery  Recent Results (from the past 168 hour(s))  CBC   Collection Time   01/15/11  5:35 PM      Component Value Range   WBC 7.6  4.0 - 10.5 (K/uL)   RBC 4.26  4.22 - 5.81 (MIL/uL)   Hemoglobin 13.1  13.0 - 17.0 (g/dL)   HCT 65.7  84.6 - 96.2 (%)   MCV 92.5  78.0 - 100.0 (fL)   MCH 30.8  26.0 - 34.0 (pg)   MCHC 33.2  30.0 - 36.0 (g/dL)   RDW 95.2  84.1 - 32.4 (%)   Platelets 216  150 - 400 (K/uL)  DIFFERENTIAL   Collection Time   01/15/11  5:35 PM      Component Value Range   Neutrophils Relative 61  43 - 77 (%)   Neutro Abs 4.6  1.7 - 7.7 (K/uL)   Lymphocytes  Relative 28  12 - 46 (%)   Lymphs Abs 2.1  0.7 - 4.0 (K/uL)   Monocytes Relative 9  3 - 12 (%)   Monocytes Absolute 0.7  0.1 - 1.0 (K/uL)   Eosinophils Relative 3  0 - 5 (%)   Eosinophils Absolute 0.2  0.0 - 0.7 (K/uL)   Basophils Relative 0  0 - 1 (%)   Basophils Absolute 0.0  0.0 - 0.1 (K/uL)  POCT I-STAT, CHEM 8   Collection Time   01/15/11  6:43 PM      Component Value Range   Sodium 137  135 - 145 (mEq/L)   Potassium 4.1  3.5 - 5.1 (mEq/L)   Chloride 105  96 - 112 (mEq/L)   BUN 19  6 - 23 (mg/dL)   Creatinine, Ser 4.01  0.50 - 1.35 (mg/dL)   Glucose, Bld 90  70 - 99 (mg/dL)   Calcium, Ion 0.27  2.53 - 1.32 (mmol/L)   TCO2 25  0 - 100 (mmol/L)   Hemoglobin 14.6  13.0 - 17.0 (g/dL)   HCT 66.4  40.3 - 47.4 (%)  CBC   Collection Time   01/17/11  5:52 AM      Component Value Range   WBC 6.4  4.0 - 10.5 (K/uL)   RBC 4.79  4.22 - 5.81 (MIL/uL)  Hemoglobin 15.0  13.0 - 17.0 (g/dL)   HCT 91.4  78.2 - 95.6 (%)   MCV 94.2  78.0 - 100.0 (fL)   MCH 31.3  26.0 - 34.0 (pg)   MCHC 33.3  30.0 - 36.0 (g/dL)   RDW 21.3  08.6 - 57.8 (%)   Platelets 178  150 - 400 (K/uL)  BASIC METABOLIC PANEL   Collection Time   01/17/11  5:52 AM      Component Value Range   Sodium 133 (*) 135 - 145 (mEq/L)   Potassium 4.5  3.5 - 5.1 (mEq/L)   Chloride 100  96 - 112 (mEq/L)   CO2 26  19 - 32 (mEq/L)   Glucose, Bld 95  70 - 99 (mg/dL)   BUN 17  6 - 23 (mg/dL)   Creatinine, Ser 4.69  0.50 - 1.35 (mg/dL)   Calcium 9.8  8.4 - 62.9 (mg/dL)   GFR calc non Af Amer 74 (*) >90 (mL/min)   GFR calc Af Amer 86 (*) >90 (mL/min)     Discharge Exam: Blood pressure 132/91, pulse 65, temperature 97 F (36.1 C), temperature source Axillary, resp. rate 18, height 6\' 4"  (1.93 m), weight 59.739 kg (131 lb 11.2 oz), SpO2 99.00%. General appearance: alert and cooperative Resp: clear to auscultation bilaterally Cardio: regular rate and rhythm, S1, S2 normal, no murmur, click, rub or gallop GI: soft, non-tender;  bowel sounds normal; no masses,  no organomegaly Incision/Wound:Swollen right hand 3rd finger. Looks less swollen now. Covered with dressing.  Disposition: To Home  Discharge Orders    Future Orders Please Complete By Expires   Diet - low sodium heart healthy      Increase activity slowly      Discharge instructions      Comments:   BE SURE TO CALL DR. ORTMANN AT [phone] TO MAKE AN APPOINTMENT FOR NEXT WEEK.   Leave dressing on - Keep it clean, dry, and intact until clinic visit      Comments:   A NURSE WILL COME TO PROVIDE DRESSING CHANGES     Current Discharge Medication List    START taking these medications   Details  amoxicillin (AMOXIL) 500 MG capsule Take 1 capsule (500 mg total) by mouth every 8 (eight) hours. Qty: 45 capsule, Refills: 0    doxycycline (VIBRA-TABS) 100 MG tablet Take 1 tablet (100 mg total) by mouth every 12 (twelve) hours. Qty: 30 tablet, Refills: 0    HYDROcodone-acetaminophen (NORCO) 5-325 MG per tablet Take 1 tablet by mouth every 4 (four) hours as needed for pain. Qty: 20 tablet, Refills: 0    nicotine (NICODERM CQ - DOSED IN MG/24 HOURS) 14 mg/24hr patch Place 1 patch onto the skin daily. Qty: 28 patch, Refills: 2      CONTINUE these medications which have NOT CHANGED   Details  aspirin 81 MG tablet Take 81 mg by mouth daily.      metoprolol succinate (TOPROL XL) 25 MG 24 hr tablet Take 1 tablet (25 mg total) by mouth daily. Qty: 30 tablet, Refills: 6    omeprazole (PRILOSEC) 10 MG capsule Take 10 mg by mouth daily.      rosuvastatin (CRESTOR) 10 MG tablet Take 10 mg by mouth daily.           SignedOsvaldo Shipper 01/18/2011, 2:24 PM

## 2011-01-26 ENCOUNTER — Encounter (HOSPITAL_BASED_OUTPATIENT_CLINIC_OR_DEPARTMENT_OTHER): Payer: Medicare Other | Attending: General Surgery

## 2011-01-26 DIAGNOSIS — X58XXXA Exposure to other specified factors, initial encounter: Secondary | ICD-10-CM | POA: Insufficient documentation

## 2011-01-26 DIAGNOSIS — S61209A Unspecified open wound of unspecified finger without damage to nail, initial encounter: Secondary | ICD-10-CM | POA: Insufficient documentation

## 2011-02-16 ENCOUNTER — Encounter (HOSPITAL_BASED_OUTPATIENT_CLINIC_OR_DEPARTMENT_OTHER): Payer: Medicare Other | Attending: General Surgery

## 2011-02-16 DIAGNOSIS — X58XXXA Exposure to other specified factors, initial encounter: Secondary | ICD-10-CM | POA: Insufficient documentation

## 2011-02-16 DIAGNOSIS — S61209A Unspecified open wound of unspecified finger without damage to nail, initial encounter: Secondary | ICD-10-CM | POA: Insufficient documentation

## 2011-03-02 ENCOUNTER — Encounter (HOSPITAL_BASED_OUTPATIENT_CLINIC_OR_DEPARTMENT_OTHER): Payer: Medicare Other

## 2011-03-28 DIAGNOSIS — L02818 Cutaneous abscess of other sites: Secondary | ICD-10-CM | POA: Diagnosis not present

## 2011-04-21 ENCOUNTER — Encounter: Payer: Self-pay | Admitting: Internal Medicine

## 2011-04-21 DIAGNOSIS — I739 Peripheral vascular disease, unspecified: Secondary | ICD-10-CM

## 2011-04-21 DIAGNOSIS — K2990 Gastroduodenitis, unspecified, without bleeding: Secondary | ICD-10-CM | POA: Insufficient documentation

## 2011-04-21 DIAGNOSIS — Z5111 Encounter for antineoplastic chemotherapy: Secondary | ICD-10-CM | POA: Insufficient documentation

## 2011-04-21 DIAGNOSIS — K635 Polyp of colon: Secondary | ICD-10-CM

## 2011-04-21 DIAGNOSIS — F99 Mental disorder, not otherwise specified: Secondary | ICD-10-CM

## 2011-04-21 DIAGNOSIS — J449 Chronic obstructive pulmonary disease, unspecified: Secondary | ICD-10-CM

## 2011-04-21 HISTORY — DX: Polyp of colon: K63.5

## 2011-04-21 HISTORY — DX: Mental disorder, not otherwise specified: F99

## 2011-04-21 HISTORY — DX: Peripheral vascular disease, unspecified: I73.9

## 2011-04-21 HISTORY — DX: Chronic obstructive pulmonary disease, unspecified: J44.9

## 2011-04-23 ENCOUNTER — Ambulatory Visit (INDEPENDENT_AMBULATORY_CARE_PROVIDER_SITE_OTHER): Payer: Medicare Other | Admitting: Internal Medicine

## 2011-04-23 ENCOUNTER — Other Ambulatory Visit: Payer: Self-pay | Admitting: Internal Medicine

## 2011-04-23 ENCOUNTER — Encounter: Payer: Self-pay | Admitting: Internal Medicine

## 2011-04-23 ENCOUNTER — Other Ambulatory Visit (INDEPENDENT_AMBULATORY_CARE_PROVIDER_SITE_OTHER): Payer: Medicare Other

## 2011-04-23 VITALS — BP 142/100 | HR 59 | Temp 97.0°F | Ht 75.0 in | Wt 144.2 lb

## 2011-04-23 DIAGNOSIS — E785 Hyperlipidemia, unspecified: Secondary | ICD-10-CM | POA: Diagnosis not present

## 2011-04-23 DIAGNOSIS — I714 Abdominal aortic aneurysm, without rupture, unspecified: Secondary | ICD-10-CM

## 2011-04-23 DIAGNOSIS — N320 Bladder-neck obstruction: Secondary | ICD-10-CM | POA: Diagnosis not present

## 2011-04-23 DIAGNOSIS — R972 Elevated prostate specific antigen [PSA]: Secondary | ICD-10-CM | POA: Insufficient documentation

## 2011-04-23 DIAGNOSIS — Z0000 Encounter for general adult medical examination without abnormal findings: Secondary | ICD-10-CM

## 2011-04-23 DIAGNOSIS — I10 Essential (primary) hypertension: Secondary | ICD-10-CM | POA: Diagnosis not present

## 2011-04-23 DIAGNOSIS — R5383 Other fatigue: Secondary | ICD-10-CM

## 2011-04-23 DIAGNOSIS — R5381 Other malaise: Secondary | ICD-10-CM | POA: Diagnosis not present

## 2011-04-23 LAB — CBC WITH DIFFERENTIAL/PLATELET
Eosinophils Relative: 4.8 % (ref 0.0–5.0)
HCT: 40.3 % (ref 39.0–52.0)
Hemoglobin: 13.5 g/dL (ref 13.0–17.0)
Lymphs Abs: 1.7 10*3/uL (ref 0.7–4.0)
Monocytes Relative: 8.9 % (ref 3.0–12.0)
Neutro Abs: 4 10*3/uL (ref 1.4–7.7)
WBC: 6.6 10*3/uL (ref 4.5–10.5)

## 2011-04-23 LAB — HEPATIC FUNCTION PANEL
ALT: 18 U/L (ref 0–53)
AST: 23 U/L (ref 0–37)
Albumin: 3.7 g/dL (ref 3.5–5.2)
Alkaline Phosphatase: 68 U/L (ref 39–117)
Bilirubin, Direct: 0.1 mg/dL (ref 0.0–0.3)
Total Bilirubin: 0.5 mg/dL (ref 0.3–1.2)
Total Protein: 6.8 g/dL (ref 6.0–8.3)

## 2011-04-23 LAB — LIPID PANEL
Cholesterol: 207 mg/dL — ABNORMAL HIGH (ref 0–200)
HDL: 76.7 mg/dL (ref >39.00)
VLDL: 14.6 mg/dL (ref 0.0–40.0)

## 2011-04-23 LAB — URINALYSIS, ROUTINE W REFLEX MICROSCOPIC
Ketones, ur: NEGATIVE
Specific Gravity, Urine: 1.02 (ref 1.000–1.030)
Total Protein, Urine: NEGATIVE
Urine Glucose: NEGATIVE
pH: 6 (ref 5.0–8.0)

## 2011-04-23 LAB — BASIC METABOLIC PANEL
Calcium: 9 mg/dL (ref 8.4–10.5)
GFR: 63.17 mL/min (ref >60.00)
Glucose, Bld: 82 mg/dL (ref 70–99)
Sodium: 139 mEq/L (ref 135–145)

## 2011-04-23 LAB — PSA: PSA: 4.19 ng/mL — ABNORMAL HIGH (ref 0.10–4.00)

## 2011-04-23 LAB — LDL CHOLESTEROL, DIRECT: Direct LDL: 112.8 mg/dL

## 2011-04-23 LAB — TSH: TSH: 0.4 u[IU]/mL (ref 0.35–5.50)

## 2011-04-23 MED ORDER — METOPROLOL SUCCINATE ER 25 MG PO TB24: 25.0000 mg | ORAL_TABLET | Freq: Every day | ORAL | Status: DC

## 2011-04-23 MED ORDER — TAMSULOSIN HCL 0.4 MG PO CAPS: 0.4000 mg | ORAL_CAPSULE | Freq: Every day | ORAL | Status: DC

## 2011-04-23 MED ORDER — OMEPRAZOLE 10 MG PO CPDR: 10.0000 mg | DELAYED_RELEASE_CAPSULE | Freq: Every day | ORAL | Status: DC

## 2011-04-23 MED ORDER — ROSUVASTATIN CALCIUM 10 MG PO TABS: 10.0000 mg | ORAL_TABLET | Freq: Every day | ORAL | Status: DC

## 2011-04-23 MED ORDER — LISINOPRIL 10 MG PO TABS: 10.0000 mg | ORAL_TABLET | Freq: Every day | ORAL | Status: DC

## 2011-04-23 NOTE — Progress Notes (Signed)
Subjective:    Patient ID: [name], male    DOB: 11/25/1950, 61 y.o.   MRN: [id number]  HPI  Here to establish as new pt; here with sister who helps with hx; pt somewhat rambling, tangential and indistinct speech - somewhat difficult historian;  Pt denies chest pain, increased sob or doe, wheezing, orthopnea, PND, increased LE swelling, palpitations, dizziness or syncope.  Pt denies new neurological symptoms such as new headache, or facial or extremity weakness or numbness   Pt denies polydipsia, polyuria,   Pt states overall good compliance with meds, trying to follow lower cholesterol diet, wt overall stable, but has been out of his lisiniopril 10 mg for over 2 months.  Does c/o mild prostatism with some difficulty passing urine but o/w  Denies urinary symptoms such as dysuria, frequency, urgency,or hematuria.  Has been hospd 2010 for cabg, then 2012 for finger cellultiis, but essentially lost to f/u with regular care since has seen Dr Katz/card 2010.  Does have sense of ongoing fatigue, but denies signficant hypersomnolence.  Denies worsening depressive symptoms, suicidal ideation, or panic, though has ongoing anxiety, not increased recently.   Needs med refills. Due for screening colonsocpy to which he is aggreable, has seen Dr Diamond Nickel for EGD. Past Medical History  Diagnosis Date  . Coronary artery disease   . Hypertension   . Hyperlipidemia   . COPD (chronic obstructive pulmonary disease) 04/21/2011  . Hyperplastic colon polyp 04/21/2011  . PVD (peripheral vascular disease) 04/21/2011  . Chronic mental illness 04/21/2011   Past Surgical History  Procedure Date  . Coronary artery bypass graft     reports that he has been smoking Cigarettes.  He has been smoking about 1 pack per day. He does not have any smokeless tobacco history on file. He reports that he does not drink alcohol or use illicit drugs. family history includes Arthritis in his sister; Cancer in his father; and Heart disease in  his father. No Known Allergies Current Outpatient Prescriptions on File Prior to Visit  Medication Sig Dispense Refill  . aspirin 81 MG tablet Take 81 mg by mouth daily.        . metoprolol succinate (TOPROL XL) 25 MG 24 hr tablet Take 1 tablet (25 mg total) by mouth daily.  30 tablet  6  . omeprazole (PRILOSEC) 10 MG capsule Take 10 mg by mouth daily.        . rosuvastatin (CRESTOR) 10 MG tablet Take 10 mg by mouth daily.         Review of Systems Review of Systems  Constitutional: Negative for diaphoresis and unexpected weight change.  HENT: Negative for drooling and tinnitus.   Eyes: Negative for photophobia and visual disturbance.  Respiratory: Negative for choking and stridor.   Gastrointestinal: Negative for vomiting and blood in stool.  Genitourinary: Negative for hematuria and decreased urine volume.  Musculoskeletal: Negative for gait problem.  Skin: Negative for color change and wound.  Neurological: Negative for tremors and numbness.  Psychiatric/Behavioral: Negative for decreased concentration. The patient is not hyperactive.       Objective:   Physical Exam BP 142/100  Pulse 59  Temp(Src) 97 F (36.1 C) (Oral)  Ht 6\' 3"  (1.905 m)  Wt 144 lb 4 oz (65.431 kg)  BMI 18.03 kg/m2  SpO2 97% Physical Exam  VS noted, think smoker Constitutional: Pt appears well-developed and well-nourished.  HENT: Head: Normocephalic.  Right Ear: External ear normal.  Left Ear: External ear normal.  Eyes: Conjunctivae and EOM are normal. Pupils are equal, round, and reactive to light.  Neck: Normal range of motion. Neck supple.  Cardiovascular: Normal rate and regular rhythm.   Pulmonary/Chest: Effort normal and breath sounds normal.  Abd:  Soft, NT, non-distended, + BS Neurological: Pt is alert. No cranial nerve deficit.  Skin: Skin is warm. No erythema.  Psychiatric:  ? Schizophrenia, but not overly nervous or depressed Assessment & Plan:

## 2011-04-23 NOTE — Assessment & Plan Note (Addendum)
Mild elev today out of meds, for med refills, cont to monitor  BP at home and next visit,  to f/u any worsening symptoms or concerns  BP Readings from Last 3 Encounters:  04/23/11 142/100  01/18/11 132/91  02/25/09 122/76

## 2011-04-23 NOTE — Assessment & Plan Note (Signed)
Due for f/u aortic u/s - will order

## 2011-04-23 NOTE — Patient Instructions (Addendum)
Take all new medications as prescribed - the generic for flomax to help with urination Continue all other medications as before, including re-starting the blood pressure medications All of your prescriptions were sent to [facility] Please go to LAB in the Basement for the blood and/or urine tests to be done today Please call the phone number [phone] (the PhoneTree System) for results of testing in 2-3 days;  When calling, simply dial the number, and when prompted enter the MRN number above (the Medical Record Number) and the # key, then the message should start. You will be contacted regarding the referral for: screening colonoscopy, and the aortic ultrasound (to see if the aorta aneurysm has gotten any larger) Please return in 1 month, or sooner if needed

## 2011-04-23 NOTE — Assessment & Plan Note (Signed)
Etiology unclear, Exam otherwise benign, to check labs as documented, follow with expectant management  

## 2011-04-23 NOTE — Assessment & Plan Note (Signed)
Also due for ua and psa - to order, for flomax asd

## 2011-04-23 NOTE — Assessment & Plan Note (Signed)
stable overall by hx and exam, most recent data reviewed with pt, and pt to continue medical treatment as before For lipids today, goal ldl < 70 

## 2011-04-23 NOTE — Assessment & Plan Note (Signed)
Not charged today,but due for screening colonoscpy - will order

## 2011-05-03 ENCOUNTER — Encounter: Payer: Medicare Other | Admitting: Cardiology

## 2011-05-14 ENCOUNTER — Encounter: Payer: Medicare Other | Admitting: Cardiology

## 2011-05-18 ENCOUNTER — Other Ambulatory Visit: Payer: Self-pay | Admitting: Cardiology

## 2011-05-18 DIAGNOSIS — I714 Abdominal aortic aneurysm, without rupture: Secondary | ICD-10-CM

## 2011-05-21 ENCOUNTER — Ambulatory Visit: Payer: Medicare Other | Admitting: Internal Medicine

## 2011-05-21 DIAGNOSIS — Z0289 Encounter for other administrative examinations: Secondary | ICD-10-CM

## 2011-06-01 ENCOUNTER — Other Ambulatory Visit: Payer: Self-pay | Admitting: Internal Medicine

## 2011-06-01 ENCOUNTER — Encounter (INDEPENDENT_AMBULATORY_CARE_PROVIDER_SITE_OTHER): Payer: Medicare Other

## 2011-06-01 DIAGNOSIS — I723 Aneurysm of iliac artery: Secondary | ICD-10-CM

## 2011-06-01 DIAGNOSIS — I714 Abdominal aortic aneurysm, without rupture: Secondary | ICD-10-CM

## 2011-06-01 DIAGNOSIS — I739 Peripheral vascular disease, unspecified: Secondary | ICD-10-CM

## 2011-06-01 NOTE — Telephone Encounter (Signed)
Received phone message from vasc tech regaring prelim result;  Has AAA 3.3 x 3/5 cm, but also enlarging bilat iliac anuerysms, largest approx 4.5 cm -   For urgent vasc surgury eval - referral done per emr

## 2011-06-06 DIAGNOSIS — N402 Nodular prostate without lower urinary tract symptoms: Secondary | ICD-10-CM | POA: Diagnosis not present

## 2011-06-06 DIAGNOSIS — N401 Enlarged prostate with lower urinary tract symptoms: Secondary | ICD-10-CM | POA: Diagnosis not present

## 2011-06-06 DIAGNOSIS — R972 Elevated prostate specific antigen [PSA]: Secondary | ICD-10-CM | POA: Diagnosis not present

## 2011-06-07 ENCOUNTER — Encounter: Payer: Medicare Other | Admitting: Vascular Surgery

## 2011-06-21 ENCOUNTER — Encounter: Payer: Self-pay | Admitting: Vascular Surgery

## 2011-06-22 ENCOUNTER — Encounter: Payer: Self-pay | Admitting: Vascular Surgery

## 2011-06-22 ENCOUNTER — Ambulatory Visit (INDEPENDENT_AMBULATORY_CARE_PROVIDER_SITE_OTHER): Payer: Medicare Other | Admitting: Vascular Surgery

## 2011-06-22 ENCOUNTER — Other Ambulatory Visit: Payer: Self-pay | Admitting: Vascular Surgery

## 2011-06-22 VITALS — BP 142/95 | HR 49 | Temp 97.6°F | Ht 75.0 in | Wt 145.8 lb

## 2011-06-22 DIAGNOSIS — I723 Aneurysm of iliac artery: Secondary | ICD-10-CM

## 2011-06-22 DIAGNOSIS — I714 Abdominal aortic aneurysm, without rupture, unspecified: Secondary | ICD-10-CM

## 2011-06-22 LAB — CREATININE, SERUM: Creat: 0.95 mg/dL (ref 0.50–1.35)

## 2011-06-22 NOTE — Progress Notes (Signed)
[facility] [place]  Referred by: Corwin Levins, MD [address] [facility] [address] [place], [place] [address]  Reason for referral: AAA  History of Present Illness  The patient is a 61 y.o. (31-Mar-1950) male who presents with chief complaint: incidentally found AAA.  The patient does not have back or abdominal pain.  The patient does note history of embolic episodes from the AAA.  [name] risk factors for AAA included: male, age, and smoking.  The patient notes actively smoking cigarettes.  His recent OSH aortoiliac duplex (06/01/11) found AAA 4.4 cm, L CIA 4.4, R CIA 3.2  Past Medical History  Diagnosis Date  . Coronary artery disease   . Hypertension   . Hyperlipidemia   . COPD (chronic obstructive pulmonary disease) 04/21/2011  . Hyperplastic colon polyp 04/21/2011  . PVD (peripheral vascular disease) 04/21/2011  . Chronic mental illness 04/21/2011  . CAD (coronary artery disease)     Past Surgical History  Procedure Date  . Coronary artery bypass graft 2008    History   Social History  . Marital Status: Single    Spouse Name: N/A    Number of Children: N/A  . Years of Education: 12   Occupational History  . retired    Social History Main Topics  . Smoking status: Current Everyday Smoker -- 1.0 packs/day    Types: Cigarettes  . Smokeless tobacco: Not on file  . Alcohol Use: No  . Drug Use: No  . Sexually Active:    Other Topics Concern  . Not on file   Social History Narrative  . No narrative on file    Family History  Problem Relation Age of Onset  . Cancer Father     prostate  . Heart disease Father   . Diabetes Father   . Heart attack Father 50  . Arthritis Sister   . Hyperlipidemia Sister   . Hypertension Sister     Current Outpatient Prescriptions on File Prior to Visit  Medication Sig Dispense Refill  . aspirin 81 MG tablet Take 80 mg by mouth daily.       [name] lisinopril (PRINIVIL,ZESTRIL) 10 MG tablet Take 1 tablet  (10 mg total) by mouth daily.  90 tablet  3  . metoprolol succinate (TOPROL XL) 25 MG 24 hr tablet Take 1 tablet (25 mg total) by mouth daily.  90 tablet  3  . omeprazole (PRILOSEC) 10 MG capsule Take 1 capsule (10 mg total) by mouth daily.  90 capsule  3  . rosuvastatin (CRESTOR) 10 MG tablet Take 1 tablet (10 mg total) by mouth daily.  90 tablet  3  . Tamsulosin HCl (FLOMAX) 0.4 MG CAPS Take 1 capsule (0.4 mg total) by mouth daily.  90 capsule  3    No Known Allergies  REVIEW OF SYSTEMS:  (Positives checked otherwise negative)  CARDIOVASCULAR: [ ]  chest pain    [ ]  chest pressure    [ ]  palpitations   [x]  varicose veins [ ]  orthopnea   [ ]  dyspnea on exert. [ ]  claudication    [ ]  rest pain     [x]  DVT     [ ]  phlebitis  PULMONARY:    [ ]  productive cough [ ]  asthma  [ ]  wheezing  NEUROLOGIC:    [ ]  weakness    [ ]  paresthesias   [ ]  aphasia    [ ]  amaurosis    [ ]   dizziness  HEMATOLOGIC:    [ ]  bleeding problems  [ ]  clotting disorders  MUSCULOSKEL: [ ]  joint pain     [ ]  joint swelling  GASTROINTEST:  [ ]   blood in stool   [ ]   hematemesis  GENITOURINARY:   [ ]   dysuria    [ ]   hematuria  PSYCHIATRIC:   [ ]  history of major depression  INTEGUMENTARY: [ ]  rashes    [ ]  ulcers  CONSTITUTIONAL:  [ ]  fever     [ ]  chills  Physical Examination  Filed Vitals:   06/22/11 0850  BP: 142/95  Pulse: 49  Temp: 97.6 F (36.4 C)  TempSrc: Oral  Height: 6\' 3"  (1.905 m)  Weight: 145 lb 12.8 oz (66.134 kg)   Body mass index is 18.22 kg/(m^2).  General: A&O x 3, WDWN, tall and thin  Head: [place]/AT  Ear/Nose/Throat: Hearing grossly intact, nares w/o erythema or drainage, oropharynx w/o Erythema/Exudate  Eyes: PERRLA, EOMI  Neck: Supple, no nuchal rigidity, no palpable LAD  Pulmonary: Sym exp, good air movt, CTAB, no rales, rhonchi, & wheezing  Cardiac: RRR, Nl S1, S2, no Murmurs, rubs or gallops  Vascular: Vessel Right Left  Radial Palpable Palpable  Brachial Palpable  Palpable  Carotid Palpable, without bruit Palpable, without bruit  Aorta Non-palpable N/A  Femoral Palpable Palpable  Popliteal Non-palpable Non-palpable  PT Palpable Palpable  DP Palpable Palpable   Gastrointestinal: soft, NTND, -G/R, - HSM, - masses, - CVAT B, no palpable AAA  Musculoskeletal: M/S 5/5 throughout , Extremities without ischemic changes  Neurologic: CN 2-12 intact , Pain and light touch intact in extremities   Psychiatric: Judgment intact, Mood & affect appropriate for pt's clinical situation  Dermatologic: See M/S exam for extremity exam, no rashes otherwise noted  Lymph : No Cervical, Axillary, or Inguinal lymphadenopathy   Outside Studies/Documentation 5 pages of outside documents were reviewed including: clinic notes.  Medical Decision Making  The patient is a 61 y.o. male who presents with: small AAA and large B CIA.   Based on this patient's exam and diagnostic studies, he needs CTA Chest/abd/pelvis.  The threshold for repair is AAA size > 5.5 cm, growth > 1 cm/yr, and symptomatic status.  The threshold for repair of iliac aneurysm is > 3.5 cm and symptomatic status.  The patient will follow up in 2 weeks with the above studies.   I emphasized the importance of maximal medical management including strict control of blood pressure, blood glucose, and lipid levels, antiplatelet agents, obtaining regular exercise, and cessation of smoking.    Thank you for allowing [place] to participate in this patient's care.  Leonides Sake, MD [facility] [place] Office: [phone] Pager: [phone]  06/22/2011, 9:07 AM

## 2011-07-11 ENCOUNTER — Ambulatory Visit
Admission: RE | Admit: 2011-07-11 | Discharge: 2011-07-11 | Disposition: A | Discharge status: Home / Self Care | Payer: Medicare Other | Source: Ambulatory Visit | Attending: Vascular Surgery | Admitting: Vascular Surgery

## 2011-07-11 DIAGNOSIS — N400 Enlarged prostate without lower urinary tract symptoms: Secondary | ICD-10-CM | POA: Diagnosis not present

## 2011-07-11 DIAGNOSIS — I714 Abdominal aortic aneurysm, without rupture: Secondary | ICD-10-CM | POA: Diagnosis not present

## 2011-07-11 DIAGNOSIS — I723 Aneurysm of iliac artery: Secondary | ICD-10-CM

## 2011-07-11 DIAGNOSIS — R918 Other nonspecific abnormal finding of lung field: Secondary | ICD-10-CM | POA: Diagnosis not present

## 2011-07-11 HISTORY — PX: ABDOMINAL AORTIC ANEURYSM REPAIR: SUR1152

## 2011-07-11 HISTORY — PX: ABDOMINAL AORTA STENT: SHX1108

## 2011-07-11 MED ORDER — IOHEXOL 350 MG/ML SOLN
100.0000 mL | Freq: Once | INTRAVENOUS | Status: AC | PRN
  Administered 2011-07-11: 100 mL via INTRAVENOUS

## 2011-07-12 ENCOUNTER — Encounter: Payer: Self-pay | Admitting: Vascular Surgery

## 2011-07-13 ENCOUNTER — Ambulatory Visit (INDEPENDENT_AMBULATORY_CARE_PROVIDER_SITE_OTHER): Payer: Medicare Other | Admitting: Vascular Surgery

## 2011-07-13 ENCOUNTER — Encounter: Payer: Self-pay | Admitting: Vascular Surgery

## 2011-07-13 VITALS — BP 162/94 | HR 54 | Temp 97.7°F | Ht 75.0 in | Wt 146.0 lb

## 2011-07-13 DIAGNOSIS — I723 Aneurysm of iliac artery: Secondary | ICD-10-CM | POA: Diagnosis not present

## 2011-07-13 DIAGNOSIS — I714 Abdominal aortic aneurysm, without rupture, unspecified: Secondary | ICD-10-CM

## 2011-07-13 NOTE — Progress Notes (Signed)
[facility] [place]  Established Abdominal Aortic Aneurysm  History of Present Illness  The patient is a 61 y.o. (04/28/1950) male who presents with chief complaint: follow up from CTA chest/abd/pelvis.  The patient does not have back or abdominal pain.  The patient is currently a smoker.  The patient has been seen by Dr. Tyrone Sage in the past.  [name] PMH, PSH, SH, FamHx, Med, Allergies and ROS are unchanged from 06/22/11.  Physical Examination  Filed Vitals:   07/13/11 0943  BP: 162/94  Pulse: 54  Temp: 97.7 F (36.5 C)  TempSrc: Oral  Height: 6\' 3"  (1.905 m)  Weight: 146 lb (66.225 kg)   Body mass index is 18.25 kg/(m^2).  General: A&O x 3, WDWN, cachectic, tall  Pulmonary: Sym exp, good air movt, CTAB, no rales, rhonchi, & wheezing  Cardiac: RRR, Nl S1, S2, no Murmurs, rubs or gallops  Vascular: Vessel Right Left  Radial Palpable Palpable  Brachial Palpable Palpable  Carotid Palpable, without bruit Palpable, without bruit  Aorta Non-palpable N/A  Femoral Palpable Palpable  Popliteal Non-palpable Non-palpable  PT Palpable Palpable  DP Palpable Palpable   Gastrointestinal: soft, NTND, -G/R, - HSM, - masses, - CVAT B  Musculoskeletal: M/S 5/5 throughout , Extremities without ischemic changes   Neurologic: Pain and light touch intact in extremities , Motor exam as listed above  CTA Chest/abd/pelvis (06/11/11):  There is mild enlargement of the ascending thoracic aorta measuring up to 3.9 cm.   No evidence for an aortic dissection.   Emphysema with two small pulmonary nodules. Largest measures 5 mm in left lower lobe.   Abdominal aortic aneurysm measuring up to 3.5 cm. The aneurysm measured 2.9 cm in 2010.   Aneurysmal dilatation of the common iliac arteries bilaterally. The left common iliac artery measures up to 3.8 cm and roughly measured 3 cm on the prior examination.   Plaque involving the proximal SMA without occlusion. Close to  50% narrowing of the SMA lumen as described.   Prostate gland enlargement.   Persistent free fluid in the pelvis.  Based on my review his CTA, he has a small ascending aortic aneurysm and pulmonary nodules.  I will refer him back to Dr. Tyrone Sage for evaluation.  He also has a small AAA and bilateral CIA aneurysms.  His left one is bigger than his AAA and will require repair.  Medical Decision Making  The patient is a 61 y.o. male who presents with: AAA and B CIA   Based on this patient's exam and diagnostic studies, he needs likely an EVAR for the L CIA.  This will also treated his AAA as part of the repair.  Embolization of the L IIA will also be needed.  I will be referring him to TCT for the Ascending aorta dilation and pulmonary nodules.  I will also need a preop risk stratification and preop optimization from a Cardiologist.  I am tenatively scheduling him for 21st MAY 13 for EVAR given the multiple other evaluations and interventions that may be needed.  He will follow up after the Cardiology evaluation.  I emphasized the importance of maximal medical management including strict control of blood pressure, blood glucose, and lipid levels, antiplatelet agents, obtaining regular exercise, and cessation of smoking.    Thank you for allowing [place] to participate in this patient's care.  Leonides Sake, MD [facility] [place] Office: [phone] Pager: [phone]  07/13/2011, 10:04 AM

## 2011-07-16 ENCOUNTER — Encounter: Payer: Self-pay | Admitting: Cardiology

## 2011-07-16 DIAGNOSIS — Z951 Presence of aortocoronary bypass graft: Secondary | ICD-10-CM | POA: Insufficient documentation

## 2011-07-16 DIAGNOSIS — Z720 Tobacco use: Secondary | ICD-10-CM | POA: Insufficient documentation

## 2011-07-16 DIAGNOSIS — R9430 Abnormal result of cardiovascular function study, unspecified: Secondary | ICD-10-CM | POA: Insufficient documentation

## 2011-07-16 DIAGNOSIS — I2510 Atherosclerotic heart disease of native coronary artery without angina pectoris: Secondary | ICD-10-CM | POA: Insufficient documentation

## 2011-07-16 DIAGNOSIS — E785 Hyperlipidemia, unspecified: Secondary | ICD-10-CM | POA: Insufficient documentation

## 2011-07-16 DIAGNOSIS — R918 Other nonspecific abnormal finding of lung field: Secondary | ICD-10-CM | POA: Insufficient documentation

## 2011-07-16 DIAGNOSIS — I714 Abdominal aortic aneurysm, without rupture: Secondary | ICD-10-CM | POA: Insufficient documentation

## 2011-07-16 DIAGNOSIS — I10 Essential (primary) hypertension: Secondary | ICD-10-CM | POA: Insufficient documentation

## 2011-07-19 ENCOUNTER — Other Ambulatory Visit: Payer: Self-pay

## 2011-07-19 ENCOUNTER — Ambulatory Visit (INDEPENDENT_AMBULATORY_CARE_PROVIDER_SITE_OTHER): Payer: Medicare Other | Admitting: Cardiology

## 2011-07-19 ENCOUNTER — Encounter: Payer: Self-pay | Admitting: Cardiology

## 2011-07-19 VITALS — BP 118/88 | HR 72 | Ht 75.0 in | Wt 147.0 lb

## 2011-07-19 DIAGNOSIS — I2589 Other forms of chronic ischemic heart disease: Secondary | ICD-10-CM | POA: Diagnosis not present

## 2011-07-19 DIAGNOSIS — Z720 Tobacco use: Secondary | ICD-10-CM

## 2011-07-19 DIAGNOSIS — I2510 Atherosclerotic heart disease of native coronary artery without angina pectoris: Secondary | ICD-10-CM

## 2011-07-19 DIAGNOSIS — Z01810 Encounter for preprocedural cardiovascular examination: Secondary | ICD-10-CM | POA: Insufficient documentation

## 2011-07-19 DIAGNOSIS — I714 Abdominal aortic aneurysm, without rupture: Secondary | ICD-10-CM | POA: Diagnosis not present

## 2011-07-19 DIAGNOSIS — Z9481 Bone marrow transplant status: Secondary | ICD-10-CM | POA: Diagnosis not present

## 2011-07-19 DIAGNOSIS — Z951 Presence of aortocoronary bypass graft: Secondary | ICD-10-CM | POA: Diagnosis not present

## 2011-07-19 DIAGNOSIS — F17200 Nicotine dependence, unspecified, uncomplicated: Secondary | ICD-10-CM

## 2011-07-19 NOTE — Assessment & Plan Note (Signed)
Coronary disease is stable. He is having no symptoms since his bypass in 2010.

## 2011-07-19 NOTE — Patient Instructions (Signed)
Your physician wants you to follow-up in: 6 months.  You will receive a reminder letter in the mail two months in advance. If you don't receive a letter, please call our office to schedule the follow-up appointment.  You have been cleared for Vascular Surgery

## 2011-07-19 NOTE — Assessment & Plan Note (Signed)
[name] overall cardiac status is stable. He is not having any symptoms. He is stable to proceed with vascular surgery. He is cleared from the cardiac viewpoint.

## 2011-07-19 NOTE — Assessment & Plan Note (Signed)
The patient does smoke. I have counseled him to stop. He does understand that smoking is not optimal.

## 2011-07-19 NOTE — Assessment & Plan Note (Signed)
The patient has severe peripheral vascular disease and needs a significant operation.

## 2011-07-19 NOTE — Assessment & Plan Note (Signed)
The patient does have an ejection fraction in the 40% range. He is clinically stable. There is no signs of heart failure.

## 2011-07-19 NOTE — Assessment & Plan Note (Signed)
The patient is post bypass surgery in 2010. He is stable with no recurrent symptoms.

## 2011-07-19 NOTE — Progress Notes (Signed)
HPI The patient is here todayTo followup coronary artery disease. He is also here for cardiac clearance to have significant abdominal vascular surgery. The patient underwent bypass surgery in May, 2010. His ejection fraction at that time was in the 40% range. Since that time he has done well. He has not had chest pain. He has not had any significant shortness of breath. He has not had syncope or presyncope. There has been no evidence of any significant arrhythmias.  No Known Allergies  Current Outpatient Prescriptions  Medication Sig Dispense Refill  . aspirin 81 MG tablet Take 80 mg by mouth daily.       [name] lisinopril (PRINIVIL,ZESTRIL) 10 MG tablet Take 1 tablet (10 mg total) by mouth daily.  90 tablet  3  . metoprolol succinate (TOPROL XL) 25 MG 24 hr tablet Take 1 tablet (25 mg total) by mouth daily.  90 tablet  3  . omeprazole (PRILOSEC) 10 MG capsule Take 1 capsule (10 mg total) by mouth daily.  90 capsule  3  . rosuvastatin (CRESTOR) 10 MG tablet Take 1 tablet (10 mg total) by mouth daily.  90 tablet  3  . Tamsulosin HCl (FLOMAX) 0.4 MG CAPS Take 1 capsule (0.4 mg total) by mouth daily.  90 capsule  3    History   Social History  . Marital Status: Single    Spouse Name: N/A    Number of Children: N/A  . Years of Education: 12   Occupational History  . retired    Social History Main Topics  . Smoking status: Current Everyday Smoker -- 1.0 packs/day    Types: Cigarettes  . Smokeless tobacco: Never Used  . Alcohol Use: No  . Drug Use: No  . Sexually Active: Not on file   Other Topics Concern  . Not on file   Social History Narrative  . No narrative on file    Family History  Problem Relation Age of Onset  . Cancer Father     prostate  . Heart disease Father   . Diabetes Father   . Heart attack Father 1  . Arthritis Sister   . Hyperlipidemia Sister   . Hypertension Sister     Past Medical History  Diagnosis Date  . Hypertension   . Hyperlipidemia   .  COPD (chronic obstructive pulmonary disease) 04/21/2011  . Hyperplastic colon polyp 04/21/2011  . PVD (peripheral vascular disease) 04/21/2011  . Chronic mental illness 04/21/2011    Diagnosis unclear,  Family provides good care  . CAD (coronary artery disease)   . Hx of CABG     May, 2010  . Ejection fraction     EF 35-40%, echo, May, 2010  . Abdominal aortic aneurysm     AAA And bilateral common iliac artery aneurysms  . Tobacco abuse   . Pulmonary nodule     Chest CT May, 2013, 2 small pulmonary nodules, needs appropriate followup,  this CT was not ordered by the cardiology team    Past Surgical History  Procedure Date  . Coronary artery bypass graft 2008    ROS [name] speech pattern is difficult. However there is a family member who is able to help answer questions. The 2 of them answered my questions. The patient is not having any fever, chills, headache, sweats, rash, change in vision, change in hearing, chest pain, cough, nausea vomiting, urinary symptoms. All other systems are reviewed and are negative.  PHYSICAL EXAM The patient is oriented to  person time and place. He is here with a family member. There is no jugulovenous distention. Lungs are clear. Respiratory effort is nonlabored. Cardiac exam reveals an S1 and S2. There are no clicks or significant murmurs. The abdomen is soft. There is no peripheral edema. There are no musculoskeletal deformities. There are no skin rashes. He seems to understand questions as I asked them. I believe he has a form of an expressive aphasia. This is not new.  Filed Vitals:   07/19/11 1601  BP: 118/88  Pulse: 72  Height: 6\' 3"  (1.905 m)  Weight: 147 lb (66.679 kg)   EKG is done today and reviewed by me. There is normal sinus rhythm. There is nonspecific ST-T wave abnormalities. There is no significant change. ASSESSMENT & PLAN

## 2011-07-20 ENCOUNTER — Encounter (HOSPITAL_COMMUNITY): Payer: Self-pay | Admitting: Pharmacy Technician

## 2011-07-20 ENCOUNTER — Other Ambulatory Visit: Payer: Self-pay

## 2011-07-25 DIAGNOSIS — I10 Essential (primary) hypertension: Secondary | ICD-10-CM | POA: Diagnosis not present

## 2011-07-25 DIAGNOSIS — I2510 Atherosclerotic heart disease of native coronary artery without angina pectoris: Secondary | ICD-10-CM | POA: Diagnosis not present

## 2011-07-25 DIAGNOSIS — I723 Aneurysm of iliac artery: Secondary | ICD-10-CM | POA: Diagnosis not present

## 2011-07-25 DIAGNOSIS — F17200 Nicotine dependence, unspecified, uncomplicated: Secondary | ICD-10-CM | POA: Diagnosis not present

## 2011-07-25 DIAGNOSIS — E785 Hyperlipidemia, unspecified: Secondary | ICD-10-CM | POA: Diagnosis not present

## 2011-07-25 DIAGNOSIS — I714 Abdominal aortic aneurysm, without rupture: Secondary | ICD-10-CM | POA: Diagnosis not present

## 2011-07-25 DIAGNOSIS — J449 Chronic obstructive pulmonary disease, unspecified: Secondary | ICD-10-CM | POA: Diagnosis not present

## 2011-07-25 MED ORDER — DEXTROSE 5 % IV SOLN
1.5000 g | INTRAVENOUS | Status: DC
  Filled 2011-07-25: qty 1.5

## 2011-07-26 ENCOUNTER — Ambulatory Visit (HOSPITAL_COMMUNITY)
Admission: RE | Admit: 2011-07-26 | Discharge: 2011-07-26 | Disposition: A | Discharge status: Home / Self Care | Payer: Medicare Other | Source: Ambulatory Visit | Attending: Vascular Surgery | Admitting: Vascular Surgery

## 2011-07-26 ENCOUNTER — Encounter (HOSPITAL_COMMUNITY): Payer: Self-pay | Admitting: *Deleted

## 2011-07-26 ENCOUNTER — Ambulatory Visit (HOSPITAL_COMMUNITY): Payer: Medicare Other

## 2011-07-26 ENCOUNTER — Encounter (HOSPITAL_COMMUNITY)
Admission: RE | Disposition: A | Discharge status: Home / Self Care | Payer: Self-pay | Source: Ambulatory Visit | Attending: Vascular Surgery

## 2011-07-26 DIAGNOSIS — J449 Chronic obstructive pulmonary disease, unspecified: Secondary | ICD-10-CM | POA: Insufficient documentation

## 2011-07-26 DIAGNOSIS — J4489 Other specified chronic obstructive pulmonary disease: Secondary | ICD-10-CM | POA: Insufficient documentation

## 2011-07-26 DIAGNOSIS — I2510 Atherosclerotic heart disease of native coronary artery without angina pectoris: Secondary | ICD-10-CM | POA: Insufficient documentation

## 2011-07-26 DIAGNOSIS — E785 Hyperlipidemia, unspecified: Secondary | ICD-10-CM | POA: Insufficient documentation

## 2011-07-26 DIAGNOSIS — I714 Abdominal aortic aneurysm, without rupture, unspecified: Secondary | ICD-10-CM | POA: Insufficient documentation

## 2011-07-26 DIAGNOSIS — J438 Other emphysema: Secondary | ICD-10-CM | POA: Diagnosis not present

## 2011-07-26 DIAGNOSIS — I723 Aneurysm of iliac artery: Secondary | ICD-10-CM

## 2011-07-26 DIAGNOSIS — I10 Essential (primary) hypertension: Secondary | ICD-10-CM | POA: Insufficient documentation

## 2011-07-26 DIAGNOSIS — I2789 Other specified pulmonary heart diseases: Secondary | ICD-10-CM | POA: Diagnosis not present

## 2011-07-26 DIAGNOSIS — F17200 Nicotine dependence, unspecified, uncomplicated: Secondary | ICD-10-CM | POA: Insufficient documentation

## 2011-07-26 HISTORY — DX: Abdominal aortic aneurysm, without rupture: I71.4

## 2011-07-26 HISTORY — DX: Shortness of breath: R06.02

## 2011-07-26 HISTORY — DX: Abdominal aortic aneurysm, without rupture, unspecified: I71.40

## 2011-07-26 HISTORY — PX: EMBOLIZATION: SHX5507

## 2011-07-26 HISTORY — PX: ABDOMINAL AORTAGRAM: SHX5454

## 2011-07-26 LAB — COMPREHENSIVE METABOLIC PANEL
ALT: 15 U/L (ref 0–53)
Alkaline Phosphatase: 59 U/L (ref 39–117)
BUN: 17 mg/dL (ref 6–23)
Chloride: 106 mEq/L (ref 96–112)
GFR calc Af Amer: >90 mL/min (ref >90)
Glucose, Bld: 144 mg/dL — ABNORMAL HIGH (ref 70–99)
Potassium: 3.8 mEq/L (ref 3.5–5.1)
Total Bilirubin: 0.4 mg/dL (ref 0.3–1.2)

## 2011-07-26 LAB — BLOOD GAS, ARTERIAL
Bicarbonate: 25.4 mEq/L — ABNORMAL HIGH (ref 20.0–24.0)
TCO2: 26.6 mmol/L (ref 0–100)
pCO2 arterial: 41.1 mmHg (ref 35.0–45.0)
pH, Arterial: 7.407 (ref 7.350–7.450)
pO2, Arterial: 76.3 mmHg — ABNORMAL LOW (ref 80.0–100.0)

## 2011-07-26 LAB — URINALYSIS, ROUTINE W REFLEX MICROSCOPIC
Glucose, UA: NEGATIVE mg/dL
Hgb urine dipstick: NEGATIVE
Ketones, ur: NEGATIVE mg/dL
Protein, ur: NEGATIVE mg/dL
pH: 6 (ref 5.0–8.0)

## 2011-07-26 LAB — POCT I-STAT, CHEM 8
Chloride: 111 mEq/L (ref 96–112)
HCT: 41 % (ref 39.0–52.0)
Potassium: 4.1 mEq/L (ref 3.5–5.1)
Sodium: 137 mEq/L (ref 135–145)

## 2011-07-26 LAB — CBC
HCT: 37.9 % — ABNORMAL LOW (ref 39.0–52.0)
Hemoglobin: 12.7 g/dL — ABNORMAL LOW (ref 13.0–17.0)
RBC: 4.13 MIL/uL — ABNORMAL LOW (ref 4.22–5.81)
WBC: 5.1 10*3/uL (ref 4.0–10.5)

## 2011-07-26 LAB — APTT: aPTT: 30 seconds (ref 24–37)

## 2011-07-26 LAB — SURGICAL PCR SCREEN: MRSA, PCR: NEGATIVE

## 2011-07-26 LAB — TYPE AND SCREEN: ABO/RH(D): O POS

## 2011-07-26 SURGERY — ABDOMINAL AORTAGRAM
Anesthesia: LOCAL

## 2011-07-26 MED ORDER — FENTANYL CITRATE 0.05 MG/ML IJ SOLN
INTRAMUSCULAR | Status: AC
  Filled 2011-07-26: qty 2

## 2011-07-26 MED ORDER — HEPARIN (PORCINE) IN NACL 2-0.9 UNIT/ML-% IJ SOLN
INTRAMUSCULAR | Status: AC
  Filled 2011-07-26: qty 1000

## 2011-07-26 MED ORDER — LIDOCAINE HCL (PF) 1 % IJ SOLN
INTRAMUSCULAR | Status: AC
  Filled 2011-07-26: qty 30

## 2011-07-26 MED ORDER — MIDAZOLAM HCL 2 MG/2ML IJ SOLN
INTRAMUSCULAR | Status: AC
  Filled 2011-07-26: qty 2

## 2011-07-26 MED ORDER — SODIUM CHLORIDE 0.9 % IV SOLN: INTRAVENOUS | Status: DC

## 2011-07-26 NOTE — Discharge Instructions (Signed)

## 2011-07-26 NOTE — Pre-Procedure Instructions (Signed)
[address] [facility]  07/26/2011   Your procedure is scheduled on:  Tuesday, May 21  Report to [facility] at 0530 AM.  Call this number if you have problems the morning of surgery: [phone]   Remember:   Do not eat food:After Midnight.  May have clear liquids: up to 4 Hours before arrival.(1:30 am)  Clear liquids include soda, tea, black coffee, apple or grape juice, broth.  Take these medicines the morning of surgery with A SIP OF WATER: *Metoprolol,Prilosec, Flomax**   Do not wear jewelry, make-up or nail polish.  Do not wear lotions, powders, or perfumes. You may wear deodorant.  Do not shave 48 hours prior to surgery. Men may shave face and neck.  Do not bring valuables to the hospital.  Contacts, dentures or bridgework may not be worn into surgery.  Leave suitcase in the car. After surgery it may be brought to your room.  For patients admitted to the hospital, checkout time is 11:00 AM the day of discharge.   Patients discharged the day of surgery will not be allowed to drive home.  Name and phone number of your driver: *n/a**  Special Instructions: CHG Shower Use Special Wash: 1/2 bottle night before surgery and 1/2 bottle morning of surgery.   Please read over the following fact sheets that you were given: Pain Booklet, Coughing and Deep Breathing, Blood Transfusion Information, MRSA Information and Surgical Site Infection Prevention

## 2011-07-26 NOTE — Op Note (Addendum)
OPERATIVE NOTE   PROCEDURE: 1.  Right common femoral artery cannulation under ultrasound guidance 2.  Aortogram 3.  Bilateral pelvic angiogram 4.  Second order arterial selection 5.  Left internal iliac artery embolization x 4 (10 mm x 26 cm, 2 8 mm x 15 cm, 6 mm x 15 cm)  PRE-OPERATIVE DIAGNOSIS: abdominal aortic aneurysm with bilateral common iliac artery aneurysms  POST-OPERATIVE DIAGNOSIS: same as above   SURGEON: Leonides Sake, MD  ANESTHESIA: conscious sedation  ESTIMATED BLOOD LOSS: 50 cc  CONTRAST: 75 cc  FINDING(S):  Aorta: small abdominal aortic aneurysm   Superior mesenteric artery: patent Celiac artery: not visualized   Right Left  RA Patent Patent  CIA Patent and aneurysmal: luminal diameter 1.5 cm Patent and aneurysm:   EIA Patent Patent  IIA Patent Patent  CFA Patent Patent   SPECIMEN(S):  none  INDICATIONS:   [name] is a 61 y.o. male who presents with small abdominal aortic aneurysm and bilateral common iliac artery aneurysms.  The patient is scheduled for an EVAR, which will repair all three aneurysms.  To facilitate this, the left internal iliac artery needs to be embolized to prevent a type II endoleak.  The patient presents for: Aortogram, Bilateral pelvic angiogram, and Left internal iliac artery embolization.  I discussed with the patient the nature of angiographic procedures, especially the limited patencies of any endovascular intervention.  The patient is aware of that the risks of an angiographic procedure include but are not limited to: bleeding, infection, access site complications, renal failure, embolization, rupture of vessel, dissection, possible need for emergent surgical intervention, possible need for surgical procedures to treat the patient's pathology, stroke, death, possible bowel ischemia, and possible buttock claudication.  The patient is aware of the risks and agrees to proceed.  DESCRIPTION: After full informed consent was  obtained from the patient, the patient was brought back to the angiography suite.  The patient was placed supine upon the angiography table and connected to monitoring equipment.  The patient was then given conscious sedation, the amounts of which are documented in the patient's chart.  The patient was prepped and drape in the standard fashion for an angiographic procedure.  At this point, attention was turned to the right groin.  Under ultrasound guidance, the right common femoral artery will be cannulated with a 18 gauge needle.  The [facility] wire was passed up into the aorta.  The needle was exchanged for a 5-Fr sheath, which was advanced over the wire into the common femoral artery.  The dilator was then removed.  The Omniflush catheter was then loaded over the wire up to the level of L1.  The catheter was connected to the power injector circuit.  After de-airring and de-clotting the circuit, a power injector aortogram was completed.  I pulled the catheter down to just proximal to the aortic bifurcation.  I then did RAO and LAO obliques injection of the pelvis vasculature.  The [facility] wire was replaced in the catheter, and using the [facility] and Crossover catheter, the left common iliac artery was selected.  The catheter and wire were advanced into the external iliac artery.  The wire was exchanged for a Rosen wire.  [name] right femoral sheath was exchanged for a 5-Fr Ansel sheath, which was lodged in the left external iliac artery.  The dilator was removed.  I pulled the sheath back into the common iliac artery.  I did a hand injection to demonstrate this.  I placed  KMP catheter over the wire and exchanged the wire for a Glidewire.  Using this combination, I selected the left internal iliac artery and above the catheter and wire past the first order bifurcation of the internal iliac artery.  I advanced the sheath over the catheter so the sheath was lodged into the proximal internal iliac artery.  I did  several hand injections, adjusting the image intensifer angulation to find the optimal set of angle to image the internal iliac artery at it first bifurcation.  I exchanged the catheter for a 5-Fr end-hole catheter which I left just proximal to the first order bifurcation of internal iliac artery.  Through this catheter, I delivered a 10 mm x 26 cm Terumo framing coil.  I then placed 2 8 mm x 15 cm Terumo Azur coils within the framing coil, packing the mid-segment of this internal iliac artery.  Finally, I delivered a 6 mm x 15 cm Terumo Azur coil into the middle of these coils.  After waiting several minutes, I did the first completion injection.  It demonstrated minimal blood flow in the left internal iliac artery.  I then waited a few more minutes and repeated the injection.  It demonstrate no antegrade flow and only retrograde flow distal to the embolization coils.  I felt this was a successful outcome.  The residual non-thrombosed proximal internal iliac artery will be address as part of the EVAR with deployment of a left iliac limb.  I removed the catheter and pulled the Ansel sheath back into the right external iliac artery.  As the sheath was not secure well due its hydrophilic nature. I replaced the [facility] wire in this sheath and exchanged the sheath for a 5-Fr sheath.  The sheath was aspirated.  No clots were present and the sheath was reloaded with heparinized saline.  The plan is to remove this sheath in the holding area.  COMPLICATIONS: none  CONDITION: stable   Leonides Sake, MD [facility] [place] Office: [phone] Pager: [phone]  07/26/2011, 11:34 AM

## 2011-07-26 NOTE — H&P (Signed)
[facility] [place]  Brief History and Physical  History of Present Illness  [name] is a 61 y.o. male who presents with chief complaint: AAA and B CIA aneurysm.  The patient presents today for embolization of L IIA.    Past Medical History  Diagnosis Date  . Hypertension   . Hyperlipidemia   . COPD (chronic obstructive pulmonary disease) 04/21/2011  . Hyperplastic colon polyp 04/21/2011  . PVD (peripheral vascular disease) 04/21/2011  . Chronic mental illness 04/21/2011    Diagnosis unclear,  Family provides good care  . CAD (coronary artery disease)   . Hx of CABG     May, 2010  . Ejection fraction     EF 35-40%, echo, May, 2010  . Abdominal aortic aneurysm     AAA And bilateral common iliac artery aneurysms  . Tobacco abuse   . Pulmonary nodule     Chest CT May, 2013, 2 small pulmonary nodules, needs appropriate followup,  this CT was not ordered by the cardiology team  . Preop cardiovascular exam     Cardiac clearance for major vascular surgery, May, 2013    Past Surgical History  Procedure Date  . Coronary artery bypass graft 2008    History   Social History  . Marital Status: Single    Spouse Name: N/A    Number of Children: N/A  . Years of Education: 12   Occupational History  . retired    Social History Main Topics  . Smoking status: Current Everyday Smoker -- 1.0 packs/day    Types: Cigarettes  . Smokeless tobacco: Never Used  . Alcohol Use: No  . Drug Use: No  . Sexually Active: Not on file   Other Topics Concern  . Not on file   Social History Narrative  . No narrative on file    Family History  Problem Relation Age of Onset  . Cancer Father     prostate  . Heart disease Father   . Diabetes Father   . Heart attack Father 89  . Arthritis Sister   . Hyperlipidemia Sister   . Hypertension Sister     No current facility-administered medications on file prior to encounter.   Current Outpatient Prescriptions on  File Prior to Encounter  Medication Sig Dispense Refill  . aspirin 81 MG tablet Take 81 mg by mouth daily.       . lisinopril (PRINIVIL,ZESTRIL) 10 MG tablet Take 1 tablet (10 mg total) by mouth daily.  90 tablet  3  . metoprolol succinate (TOPROL XL) 25 MG 24 hr tablet Take 1 tablet (25 mg total) by mouth daily.  90 tablet  3  . omeprazole (PRILOSEC) 10 MG capsule Take 1 capsule (10 mg total) by mouth daily.  90 capsule  3  . rosuvastatin (CRESTOR) 10 MG tablet Take 1 tablet (10 mg total) by mouth daily.  90 tablet  3  . Tamsulosin HCl (FLOMAX) 0.4 MG CAPS Take 1 capsule (0.4 mg total) by mouth daily.  90 capsule  3    No Known Allergies  Review of Systems: Kidney Disease, As listed above, otherwise negative.  Physical Examination  Filed Vitals:   07/26/11 0938  BP: 132/90  Pulse: 75  Temp: 97 F (36.1 C)  TempSrc: Oral  Resp: 18  Height: 6' 3" (1.905 m)  Weight: 150 lb (68.04 kg)  SpO2: 97%   Body mass index is 18.75 kg/(m^2).  General: A&O x 3, WDWN, thin    Pulmonary: Sym exp, good air movt, CTAB, no rales, rhonchi, & wheezing  Cardiac: RRR, Nl S1, S2, no Murmurs, rubs or gallops  Gastrointestinal: soft, NTND, -G/R, - HSM, - masses, - CVAT B  Musculoskeletal: M/S 5/5 throughout , Extremities without ischemic changes   Laboratory See iStat  Medical Decision Making  [name] is a 61 y.o. male who presents with: AAA, B CIA aneurysm.   The patient is scheduled for: Aortogram, B pelvic angiogram, L IIA embolization I discussed with the patient the nature of angiographic procedures, especially the limited patencies of any endovascular intervention.  The patient is aware of that the risks of an angiographic procedure include but are not limited to: bleeding, infection, access site complications, renal failure, embolization, rupture of vessel, dissection, possible need for emergent surgical intervention, possible need for surgical procedures to treat the patient's  pathology, and stroke and death.    The patient is aware of the risks and agrees to proceed.  Alyssa Mancera, MD [facility]  Office: [phone] Pager: [phone]  07/26/2011, 11:58 AM    

## 2011-07-27 ENCOUNTER — Ambulatory Visit: Payer: Medicare Other | Admitting: Vascular Surgery

## 2011-07-27 NOTE — Consult Note (Signed)
Anesthesia Chart Review:  Patient is a 61 year old male scheduled for AAA EVAR on 07/31/11.  He also has bilateral CIA aneurysms.  He is s/p left IIA embolization on 07/26/11.  History includes smoking, HTN, HLD, PVD, CAD s/p CABG '10, COPD, SOB.  PCP is Dr. Oliver Barre.  Labs noted.  CXR on 07/26/11 showed post CABG, changes of COPD, but no acute abnormalities.  Cardiologist is Dr. Myrtis Ser. He was seen for pre-operative clearance on 07/19/11.  EKG then showed NSR, nonspecific inferolateral ST abnormality.  He was cleared without additional cardiac testing.  Echo on 07/28/08 showed: 1. Left ventricle: The cavity size was normal. Wall thickness was normal. Systolic function was moderately reduced. The estimated ejection fraction was in the range of 35% to 40%. Hypokinesis of the basal-mid inferior myocardium. Akinesis of the posterolateral myocardium. Doppler parameters are consistent with abnormal left ventricular relaxation (grade 1 diastolic dysfunction). 2. Pericardium, extracardiac: A trivial pericardial effusion was identified.  His last cath was on 07/28/08 prior to his CABG.  Plan to proceed if no significant change in his CV status.  Shonna Chock, PA-C

## 2011-07-31 ENCOUNTER — Encounter (HOSPITAL_COMMUNITY): Payer: Self-pay | Admitting: Vascular Surgery

## 2011-07-31 ENCOUNTER — Encounter (HOSPITAL_COMMUNITY): Payer: Self-pay | Admitting: *Deleted

## 2011-07-31 ENCOUNTER — Encounter (HOSPITAL_COMMUNITY)
Admission: RE | Disposition: A | Discharge status: Home / Self Care | Payer: Self-pay | Source: Ambulatory Visit | Attending: Vascular Surgery

## 2011-07-31 ENCOUNTER — Ambulatory Visit (HOSPITAL_COMMUNITY): Payer: Medicare Other | Admitting: Vascular Surgery

## 2011-07-31 ENCOUNTER — Inpatient Hospital Stay (HOSPITAL_COMMUNITY)
Admission: RE | Admit: 2011-07-31 | Discharge: 2011-08-01 | DRG: 238 | Disposition: A | Discharge status: Home / Self Care | Payer: Medicare Other | Source: Ambulatory Visit | Attending: Vascular Surgery | Admitting: Vascular Surgery

## 2011-07-31 ENCOUNTER — Inpatient Hospital Stay (HOSPITAL_COMMUNITY): Payer: Medicare Other

## 2011-07-31 DIAGNOSIS — I714 Abdominal aortic aneurysm, without rupture, unspecified: Secondary | ICD-10-CM | POA: Diagnosis not present

## 2011-07-31 DIAGNOSIS — J449 Chronic obstructive pulmonary disease, unspecified: Secondary | ICD-10-CM | POA: Diagnosis not present

## 2011-07-31 DIAGNOSIS — E785 Hyperlipidemia, unspecified: Secondary | ICD-10-CM | POA: Diagnosis present

## 2011-07-31 DIAGNOSIS — I2510 Atherosclerotic heart disease of native coronary artery without angina pectoris: Secondary | ICD-10-CM | POA: Diagnosis present

## 2011-07-31 DIAGNOSIS — I723 Aneurysm of iliac artery: Secondary | ICD-10-CM | POA: Diagnosis not present

## 2011-07-31 DIAGNOSIS — I10 Essential (primary) hypertension: Secondary | ICD-10-CM | POA: Diagnosis present

## 2011-07-31 DIAGNOSIS — Z951 Presence of aortocoronary bypass graft: Secondary | ICD-10-CM

## 2011-07-31 DIAGNOSIS — F17200 Nicotine dependence, unspecified, uncomplicated: Secondary | ICD-10-CM | POA: Diagnosis present

## 2011-07-31 DIAGNOSIS — J4489 Other specified chronic obstructive pulmonary disease: Secondary | ICD-10-CM | POA: Diagnosis present

## 2011-07-31 LAB — CBC
Hemoglobin: 11.8 g/dL — ABNORMAL LOW (ref 13.0–17.0)
MCH: 30.5 pg (ref 26.0–34.0)
MCHC: 33.2 g/dL (ref 30.0–36.0)
MCV: 91.7 fL (ref 78.0–100.0)
Platelets: 156 10*3/uL (ref 150–400)
RBC: 3.87 MIL/uL — ABNORMAL LOW (ref 4.22–5.81)

## 2011-07-31 SURGERY — INSERTION, ENDOVASCULAR STENT GRAFT, AORTA, ABDOMINAL
Anesthesia: General | Wound class: Clean

## 2011-07-31 MED ORDER — PROPOFOL 10 MG/ML IV EMUL
INTRAVENOUS | Status: DC | PRN
  Administered 2011-07-31: 130 mg via INTRAVENOUS

## 2011-07-31 MED ORDER — HEPARIN SODIUM (PORCINE) 1000 UNIT/ML IJ SOLN
INTRAMUSCULAR | Status: DC | PRN
  Administered 2011-07-31: 6000 [IU] via INTRAVENOUS

## 2011-07-31 MED ORDER — 0.9 % SODIUM CHLORIDE (POUR BTL) OPTIME
TOPICAL | Status: DC | PRN
  Administered 2011-07-31: 1000 mL

## 2011-07-31 MED ORDER — SODIUM CHLORIDE 0.9 % IV SOLN
INTRAVENOUS | Status: DC
  Administered 2011-07-31: 75 mL/h via INTRAVENOUS

## 2011-07-31 MED ORDER — NEOSTIGMINE METHYLSULFATE 1 MG/ML IJ SOLN
INTRAMUSCULAR | Status: DC | PRN
  Administered 2011-07-31: 3 mg via INTRAVENOUS

## 2011-07-31 MED ORDER — SODIUM CHLORIDE 0.9 % IV SOLN: INTRAVENOUS | Status: DC

## 2011-07-31 MED ORDER — HYDRALAZINE HCL 20 MG/ML IJ SOLN: 10.0000 mg | INTRAMUSCULAR | Status: DC | PRN

## 2011-07-31 MED ORDER — PANTOPRAZOLE SODIUM 40 MG PO TBEC
40.0000 mg | DELAYED_RELEASE_TABLET | Freq: Every day | ORAL | Status: DC
  Administered 2011-07-31 – 2011-08-01 (×2): 40 mg via ORAL
  Filled 2011-07-31 (×2): qty 1

## 2011-07-31 MED ORDER — ROCURONIUM BROMIDE 100 MG/10ML IV SOLN
INTRAVENOUS | Status: DC | PRN
  Administered 2011-07-31: 50 mg via INTRAVENOUS

## 2011-07-31 MED ORDER — MORPHINE SULFATE 4 MG/ML IJ SOLN: 0.0500 mg/kg | INTRAMUSCULAR | Status: DC | PRN

## 2011-07-31 MED ORDER — TAMSULOSIN HCL 0.4 MG PO CAPS
0.4000 mg | ORAL_CAPSULE | Freq: Every day | ORAL | Status: DC
  Administered 2011-07-31 – 2011-08-01 (×2): 0.4 mg via ORAL
  Filled 2011-07-31 (×2): qty 1

## 2011-07-31 MED ORDER — DOPAMINE-DEXTROSE 3.2-5 MG/ML-% IV SOLN: 3.0000 ug/kg/min | INTRAVENOUS | Status: DC

## 2011-07-31 MED ORDER — ENOXAPARIN SODIUM 40 MG/0.4ML ~~LOC~~ SOLN
40.0000 mg | SUBCUTANEOUS | Status: DC
  Administered 2011-08-01: 40 mg via SUBCUTANEOUS
  Filled 2011-07-31 (×2): qty 0.4

## 2011-07-31 MED ORDER — METOPROLOL TARTRATE 1 MG/ML IV SOLN: 2.0000 mg | INTRAVENOUS | Status: DC | PRN

## 2011-07-31 MED ORDER — MAGNESIUM SULFATE 40 MG/ML IJ SOLN
2.0000 g | Freq: Once | INTRAMUSCULAR | Status: AC | PRN
  Filled 2011-07-31: qty 50

## 2011-07-31 MED ORDER — SODIUM CHLORIDE 0.9 % IR SOLN
Status: DC | PRN
  Administered 2011-07-31: 08:00:00

## 2011-07-31 MED ORDER — GLYCOPYRROLATE 0.2 MG/ML IJ SOLN
INTRAMUSCULAR | Status: DC | PRN
  Administered 2011-07-31: .4 mg via INTRAVENOUS

## 2011-07-31 MED ORDER — BISACODYL 10 MG RE SUPP: 10.0000 mg | Freq: Every day | RECTAL | Status: DC | PRN

## 2011-07-31 MED ORDER — POTASSIUM CHLORIDE CRYS ER 20 MEQ PO TBCR: 20.0000 meq | EXTENDED_RELEASE_TABLET | Freq: Once | ORAL | Status: AC | PRN

## 2011-07-31 MED ORDER — ASPIRIN 81 MG PO CHEW
CHEWABLE_TABLET | ORAL | Status: AC
  Administered 2011-07-31: 81 mg
  Filled 2011-07-31: qty 1

## 2011-07-31 MED ORDER — ACETAMINOPHEN 650 MG RE SUPP: 325.0000 mg | RECTAL | Status: DC | PRN

## 2011-07-31 MED ORDER — DOCUSATE SODIUM 100 MG PO CAPS
100.0000 mg | ORAL_CAPSULE | Freq: Every day | ORAL | Status: DC
  Administered 2011-08-01: 100 mg via ORAL
  Filled 2011-07-31: qty 1

## 2011-07-31 MED ORDER — OXYCODONE-ACETAMINOPHEN 5-325 MG PO TABS
1.0000 | ORAL_TABLET | ORAL | Status: DC | PRN
  Administered 2011-07-31: 2 via ORAL

## 2011-07-31 MED ORDER — ONDANSETRON HCL 4 MG/2ML IJ SOLN: 4.0000 mg | Freq: Four times a day (QID) | INTRAMUSCULAR | Status: DC | PRN

## 2011-07-31 MED ORDER — SODIUM CHLORIDE 0.9 % IV SOLN: 500.0000 mL | Freq: Once | INTRAVENOUS | Status: AC | PRN

## 2011-07-31 MED ORDER — HYDROMORPHONE HCL PF 1 MG/ML IJ SOLN: 0.2500 mg | INTRAMUSCULAR | Status: DC | PRN

## 2011-07-31 MED ORDER — LACTATED RINGERS IV SOLN
INTRAVENOUS | Status: DC | PRN
  Administered 2011-07-31 (×2): via INTRAVENOUS

## 2011-07-31 MED ORDER — LISINOPRIL 10 MG PO TABS
10.0000 mg | ORAL_TABLET | Freq: Every day | ORAL | Status: DC
  Administered 2011-08-01: 10 mg via ORAL
  Filled 2011-07-31 (×2): qty 1

## 2011-07-31 MED ORDER — ALUM & MAG HYDROXIDE-SIMETH 200-200-20 MG/5ML PO SUSP: 15.0000 mL | ORAL | Status: DC | PRN

## 2011-07-31 MED ORDER — SENNOSIDES-DOCUSATE SODIUM 8.6-50 MG PO TABS
1.0000 | ORAL_TABLET | Freq: Every evening | ORAL | Status: DC | PRN
  Filled 2011-07-31: qty 1

## 2011-07-31 MED ORDER — EPHEDRINE SULFATE 50 MG/ML IJ SOLN
INTRAMUSCULAR | Status: DC | PRN
  Administered 2011-07-31: 5 mg via INTRAVENOUS

## 2011-07-31 MED ORDER — ONDANSETRON HCL 4 MG/2ML IJ SOLN
INTRAMUSCULAR | Status: DC | PRN
  Administered 2011-07-31: 4 mg via INTRAVENOUS

## 2011-07-31 MED ORDER — METOPROLOL SUCCINATE ER 25 MG PO TB24
25.0000 mg | ORAL_TABLET | Freq: Every day | ORAL | Status: DC
  Administered 2011-08-01: 25 mg via ORAL
  Filled 2011-07-31 (×2): qty 1

## 2011-07-31 MED ORDER — DEXTROSE 5 % IV SOLN
1.5000 g | Freq: Two times a day (BID) | INTRAVENOUS | Status: AC
  Administered 2011-07-31 – 2011-08-01 (×2): 1.5 g via INTRAVENOUS
  Filled 2011-07-31 (×2): qty 1.5

## 2011-07-31 MED ORDER — DEXTROSE 5 % IV SOLN
1.5000 g | INTRAVENOUS | Status: AC
  Administered 2011-07-31: 1.5 g via INTRAVENOUS
  Filled 2011-07-31: qty 1.5

## 2011-07-31 MED ORDER — PHENYLEPHRINE HCL 10 MG/ML IJ SOLN
INTRAMUSCULAR | Status: DC | PRN
  Administered 2011-07-31: 80 ug via INTRAVENOUS

## 2011-07-31 MED ORDER — MIDAZOLAM HCL 5 MG/5ML IJ SOLN
INTRAMUSCULAR | Status: DC | PRN
  Administered 2011-07-31: 2 mg via INTRAVENOUS

## 2011-07-31 MED ORDER — ATORVASTATIN CALCIUM 10 MG PO TABS
10.0000 mg | ORAL_TABLET | Freq: Every day | ORAL | Status: DC
  Administered 2011-07-31: 10 mg via ORAL
  Filled 2011-07-31 (×2): qty 1

## 2011-07-31 MED ORDER — PROTAMINE SULFATE 10 MG/ML IV SOLN
INTRAVENOUS | Status: DC | PRN
  Administered 2011-07-31: 30 mg via INTRAVENOUS

## 2011-07-31 MED ORDER — ASPIRIN EC 81 MG PO TBEC
81.0000 mg | DELAYED_RELEASE_TABLET | Freq: Every day | ORAL | Status: DC
  Administered 2011-07-31 – 2011-08-01 (×2): 81 mg via ORAL
  Filled 2011-07-31 (×2): qty 1

## 2011-07-31 MED ORDER — GUAIFENESIN-DM 100-10 MG/5ML PO SYRP: 15.0000 mL | ORAL_SOLUTION | ORAL | Status: DC | PRN

## 2011-07-31 MED ORDER — LABETALOL HCL 5 MG/ML IV SOLN: 10.0000 mg | INTRAVENOUS | Status: DC | PRN

## 2011-07-31 MED ORDER — IODIXANOL 320 MG/ML IV SOLN
INTRAVENOUS | Status: DC | PRN
  Administered 2011-07-31: 150 mL via INTRA_ARTERIAL

## 2011-07-31 MED ORDER — PHENOL 1.4 % MT LIQD: 1.0000 | OROMUCOSAL | Status: DC | PRN

## 2011-07-31 MED ORDER — ACETAMINOPHEN 325 MG PO TABS: 325.0000 mg | ORAL_TABLET | ORAL | Status: DC | PRN

## 2011-07-31 MED ORDER — MORPHINE SULFATE 2 MG/ML IJ SOLN: 2.0000 mg | INTRAMUSCULAR | Status: DC | PRN

## 2011-07-31 MED ORDER — FENTANYL CITRATE 0.05 MG/ML IJ SOLN
INTRAMUSCULAR | Status: DC | PRN
  Administered 2011-07-31: 100 ug via INTRAVENOUS
  Administered 2011-07-31: 50 ug via INTRAVENOUS

## 2011-07-31 MED ORDER — PHENYLEPHRINE HCL 10 MG/ML IJ SOLN
10.0000 mg | INTRAVENOUS | Status: DC | PRN
  Administered 2011-07-31: 25 ug/min via INTRAVENOUS

## 2011-07-31 SURGICAL SUPPLY — 76 items
ADH SKN CLS APL DERMABOND .7 (GAUZE/BANDAGES/DRESSINGS) ×2
BAG DECANTER FOR FLEXI CONT (MISCELLANEOUS) IMPLANT
BAG SNAP BAND KOVER 36X36 (MISCELLANEOUS) ×2 IMPLANT
BALLN CODA OCL 2-9.0-35-120-3 (BALLOONS)
BALLOON COD OCL 2-9.0-35-120-3 (BALLOONS) IMPLANT
CANISTER SUCTION 2500CC (MISCELLANEOUS) ×2 IMPLANT
CATH BEACON 5.038 65CM KMP-01 (CATHETERS) ×1 IMPLANT
CATH OMNI FLUSH .035X70CM (CATHETERS) ×1 IMPLANT
CLIP TI MEDIUM 24 (CLIP) IMPLANT
CLIP TI WIDE RED SMALL 24 (CLIP) IMPLANT
COVER MAYO STAND STRL (DRAPES) ×2 IMPLANT
COVER SURGICAL LIGHT HANDLE (MISCELLANEOUS) ×4 IMPLANT
DERMABOND ADVANCED (GAUZE/BANDAGES/DRESSINGS) ×2
DERMABOND ADVANCED .7 DNX12 (GAUZE/BANDAGES/DRESSINGS) ×1 IMPLANT
DEVICE CLOSURE PERCLS PRGLD 6F (VASCULAR PRODUCTS) IMPLANT
DEVICE TORQUE 50000 (MISCELLANEOUS) ×1 IMPLANT
DRAIN CHANNEL 10F 3/8 F FF (DRAIN) IMPLANT
DRAIN CHANNEL 10M FLAT 3/4 FLT (DRAIN) IMPLANT
DRAPE C-ARM 42X72 X-RAY (DRAPES) ×2 IMPLANT
DRAPE PROXIMA HALF (DRAPES) ×1 IMPLANT
DRAPE TABLE COVER HEAVY DUTY (DRAPES) ×2 IMPLANT
DRESSING OPSITE X SMALL 2X3 (GAUZE/BANDAGES/DRESSINGS) ×2 IMPLANT
DRYSEAL FLEXSHEATH 18FR 33CM (SHEATH) ×1
ELECT CAUTERY BLADE 6.4 (BLADE) ×2 IMPLANT
ELECT REM PT RETURN 9FT ADLT (ELECTROSURGICAL) ×4
ELECTRODE REM PT RTRN 9FT ADLT (ELECTROSURGICAL) ×2 IMPLANT
EVACUATOR 3/16  PVC DRAIN (DRAIN)
EVACUATOR 3/16 PVC DRAIN (DRAIN) IMPLANT
EVACUATOR SILICONE 100CC (DRAIN) IMPLANT
EXCLUDER TRUNK (Endovascular Graft) ×1 IMPLANT
GAUZE SPONGE 4X4 16PLY XRAY LF (GAUZE/BANDAGES/DRESSINGS) ×1 IMPLANT
GLOVE BIO SURGEON STRL SZ7 (GLOVE) ×2 IMPLANT
GLOVE BIO SURGEON STRL SZ7.5 (GLOVE) ×1 IMPLANT
GLOVE BIOGEL PI IND STRL 7.5 (GLOVE) ×1 IMPLANT
GLOVE BIOGEL PI INDICATOR 7.5 (GLOVE) ×2
GLOVE ECLIPSE 7.5 STRL STRAW (GLOVE) ×2 IMPLANT
GLOVE SURG SS PI 7.0 STRL IVOR (GLOVE) ×2 IMPLANT
GOWN STRL NON-REIN LRG LVL3 (GOWN DISPOSABLE) ×10 IMPLANT
GRAFT BALLN CATH 65CM (STENTS) IMPLANT
GRAFT EXCLUDER LEG 16X12 (Endovascular Graft) ×1 IMPLANT
GUIDEWIRE ANGLED .035X150CM (WIRE) ×1 IMPLANT
KIT BASIN OR (CUSTOM PROCEDURE TRAY) ×2 IMPLANT
KIT ROOM TURNOVER OR (KITS) ×2 IMPLANT
LEG CONTRALATERAL 20X11.5 (Vascular Products) ×2 IMPLANT
NDL PERC 18GX7CM (NEEDLE) ×1 IMPLANT
NEEDLE PERC 18GX7CM (NEEDLE) ×2 IMPLANT
NS IRRIG 1000ML POUR BTL (IV SOLUTION) ×3 IMPLANT
PACK AORTA (CUSTOM PROCEDURE TRAY) ×2 IMPLANT
PAD ARMBOARD 7.5X6 YLW CONV (MISCELLANEOUS) ×4 IMPLANT
PENCIL BUTTON HOLSTER BLD 10FT (ELECTRODE) IMPLANT
PERCLOSE PROGLIDE 6F (VASCULAR PRODUCTS) ×10
SHEATH AVANTI 11CM 8FR (MISCELLANEOUS) ×1 IMPLANT
SHEATH BRITE TIP 8FR 23CM (MISCELLANEOUS) ×1 IMPLANT
SHEATH DRYSEAL FLEX 18FR 33CM (SHEATH) IMPLANT
SHEATH DRYSEAL GORE 12FRX28 (SHEATH) ×1 IMPLANT
STAPLER VISISTAT 35W (STAPLE) IMPLANT
STENT GRAFT BALLN CATH 65CM (STENTS) ×1
STENT GRAFT CONTRALAT 20X11.5 (Vascular Products) IMPLANT
STOPCOCK MORSE 400PSI 3WAY (MISCELLANEOUS) ×2 IMPLANT
SUT ETHILON 3 0 PS 1 (SUTURE) IMPLANT
SUT MNCRL AB 4-0 PS2 18 (SUTURE) ×4 IMPLANT
SUT PROLENE 5 0 C 1 24 (SUTURE) IMPLANT
SUT VIC AB 2-0 CTX 36 (SUTURE) IMPLANT
SUT VIC AB 3-0 SH 27 (SUTURE)
SUT VIC AB 3-0 SH 27X BRD (SUTURE) IMPLANT
SYR 20CC LL (SYRINGE) ×4 IMPLANT
SYR 30ML LL (SYRINGE) IMPLANT
SYR MEDRAD MARK V 150ML (SYRINGE) ×2 IMPLANT
SYRINGE 10CC LL (SYRINGE) ×6 IMPLANT
TOWEL OR 17X24 6PK STRL BLUE (TOWEL DISPOSABLE) ×4 IMPLANT
TOWEL OR 17X26 10 PK STRL BLUE (TOWEL DISPOSABLE) ×4 IMPLANT
TRAY FOLEY CATH 14FRSI W/METER (CATHETERS) ×2 IMPLANT
TUBING HIGH PRESSURE 120CM (CONNECTOR) ×2 IMPLANT
WATER STERILE IRR 1000ML POUR (IV SOLUTION) ×1 IMPLANT
WIRE AMPLATZ SS-J .035X180CM (WIRE) ×2 IMPLANT
WIRE BENTSON .035X145CM (WIRE) ×2 IMPLANT

## 2011-07-31 NOTE — Op Note (Signed)
OPERATIVE NOTE   PROCEDURE:  1. Bilateral common femoral artery cannulation under ultrasound guidance 2. "Preclose" repair bilateral common femoral artery  3. Placement of catheter in aorta x 2 4. Aortogram 5. Repair of aorta with modular bifurcated prosthesis with one limb 6. Placement of right iliac limb 7. Placement of left iliac limb extension 8. Radiology S&I  PRE-OPERATIVE DIAGNOSIS: small abdominal aortic aneurysm, large left common iliac artery aneurysm, right common iliac artery aneurysm  POST-OPERATIVE DIAGNOSIS: same as above   CO-SURGEONs: Leonides Sake, MD; Cari Caraway, MD   ANESTHESIA: general   ESTIMATED BLOOD LOSS: 50 cc   FINDING(S):  1. No type I endoleak 2. Bilateral renal arteries widely patent at end of case 3. Successful exclusion of abdominal aortic aneurysm  4. Successful exclusion of iliac artery aneurysms 5. Dopplerable bilateral pedal signals  INDICATIONS:  [name] is 61 y.o. male who presents with large left common iliac artery aneurysm in the setting of small abdominal aortic aneurysm and also moderate size right common iliac artery aneurysm.  The patient is aware the risks of aortic surgery include but are not limited to: bleeding, need for transfusion, infection, death, stroke, paralysis, wound complications, impotence, bowel ischemia, extended ventilation and need for secondary procedures. Overall, I cited a mortality rate of 1-2% and morbidity rate of 15%.   DESCRIPTION:After full informed written consent was obtained from the patient, he was brought back to the operating room, amd placed supine upon the operating table. He already had a A-line place. After obtaining adequate anesthesia, a Foley catheter was placed to monitor urine output in the operating room. He was prepped and draped in the standard fashion for either open aneurysm repair or endovascular aneurysm repair. I turned my attention to the left groin. Under ultrasound guidance, I  identified the common femoral artery and made a stab incision over it.  I dissected down to the artery in the groin with a hemostat and then cannulated it with a 18-gauge needle. I then passed a Bentson wire up into the iliac system. The first ProGlide was placed and rotated medially. The second ProGlide was placed and rotated laterally. In such fashion, I completed the pre-close technique on this side and then replaced the Bentson wire up into the aorta. Initially, a 8-French sheath was loaded over the wire and used to dilate up this tract, then using a Kumpfe catheter, I was able to get up into the aorta and exchange the wire out for a Amplatz wire. At this point then the right groin was cannulated by Dr. Edilia Bo in a similar fashion to my side and the pre-close technique was utilized on this side. At this point, the sheath on the left side was exchanged for a 18-French Dryseal sheath. The patient was given 6000 units of Heparin which was a therapeutic bolus for the patient. I delivered the main body device, which was a 23-mm x 12-mm x 18-cm, which I delivered up to the level about L1. On Dr. Adele Dan site, then he loaded a pigtail catheter, this was connected to the power injector circuit and a power injector aortogram was completed to identify the renal arteries. The lowest artery was the left renal artery. I pulled the main body device to below this level and deployed it, taking care to take into account the angulation here. The device deployed slightly proximal to the left renal artery, so I recontrained the graft and pulled it below the left renal artery, taking into account parallax, and  released the graft.  At this point, Dr. Edilia Bo pulled down the pigtail catheter and wire and then switched out for a KMP and glidewire.  Using this combination, the pigtail catheter and Bentson, he was able to cannulate the contralateral gate. He was then able to advance both of these through the graft. The wire was  exchanged for an Amplatz wire.  The pigtail was then pulled down into the graft and rotated to demonstrate successful cannulation of the graft. He then pulled the catheter down to the flow bifurcation and then pulled the sheath back on right left side and did a retrograde injection from the sheath demonstrating the level of the internal iliac artery. Then subsequently based on the measurements, a 20-mm x 11.5 cm contralateral limb was selected. At this point, the left 8-French sheath was exchanged out for a 11-French Dryseal sheath which was then advanced up to level of flow divider. The contralateral limb was then placed through this sheath and then the sheath was pulled back to appropriate location. The iliac device was then deployed with adequate overlap. He then removed the sheath deployment system. At this point, I fully deployed the main body, fully extending the partially constrained limb and also releasing the device from the delivery system. The delivery system was then removed.  I did a retrograde injection on the left side which demonstrated that the extension of the left limb into the external iliac artery was only about 2 cm, so I felt additional extension would be beneficial to get a better seal and prevent a distal Type I endoleak into the large common iliac artery aneurysm on this side.  We selected a 12 mm x 7 cm limb extension and deployed it was adequate overlap, extending ~3-4 cm more into the external iliac artery.  The stent delivery device was removed.  We then obtained a molding balloon which was used to mold the top, the flow divider, the overlap segments and the ends of each iliac limb. The pigtail catheter was replaced on the right side and then a completion aortogram was completed. This demonstrated no type I endoleak and successful exclusion of the iliac aneurysms.  Subsequently, we turned our attention to repairing each groin.  I removed the right sheath and held pressure.  There was  absolutely no drop in pressure with removal of the sheath.  The right groin was repaired by cinching down the  ProGlide devices in two stages. In the first stage, the wire was left in place while cinching down the two sets of Proglide stitches.  There was minimal blood loss with the wire in place so I removed he wire.  The Proglide stitches were cinched down further and the white tightening stitch was pulled to fully deploy secure the knot.  All four sutures were held in place with a hemostat with tension on the skin. In a similar fashion, the left groin was repaired.  I gave the patient 30 mg of Protamine to reverse anticoagulation.  There was no further significant bleeding from either groin cannulation site so the sutures were transected with the suture transection device. Each groin was repaired with a U stitch of 4-0 Monocryl. Each foot was then tested: the left had a palpable PT and the right had a faintly palpable DP pulses.  Pedal signal were obtained without difficulty.    SPECIMEN(S): none   COMPLICATIONS: none   CONDITION: stable   Leonides Sake, MD [facility] [place] Office: [phone] Pager:  [phone]  07/31/2011, 9:34 AM

## 2011-07-31 NOTE — Interval H&P Note (Signed)
[facility] [place]  History and Physical Update  The patient was interviewed and re-examined.  [name] previous History and Physical has been reviewed and is unchanged except for: interval L IIA embolization.  There is no change in the plan of care: EVAR.  Leonides Sake, MD [facility] [place] Office: [phone] Pager: [phone]  07/31/2011, 7:06 AM

## 2011-07-31 NOTE — Plan of Care (Signed)
Problem: Consults Goal: Aorto Fem Bypass Patient Education (See Patient Education module for education specifics.) Outcome: Not Met (add Reason) Pt. Unable to be educated due to his mental status. Family will receive education.

## 2011-07-31 NOTE — OR Nursing (Signed)
[name] catheter removed without problems. Introducer left in place.

## 2011-07-31 NOTE — H&P (View-Only) (Signed)
[facility] [place]  Brief History and Physical  History of Present Illness  [name] is a 61 y.o. male who presents with chief complaint: AAA and B CIA aneurysm.  The patient presents today for embolization of L IIA.    Past Medical History  Diagnosis Date  . Hypertension   . Hyperlipidemia   . COPD (chronic obstructive pulmonary disease) 04/21/2011  . Hyperplastic colon polyp 04/21/2011  . PVD (peripheral vascular disease) 04/21/2011  . Chronic mental illness 04/21/2011    Diagnosis unclear,  Family provides good care  . CAD (coronary artery disease)   . Hx of CABG     May, 2010  . Ejection fraction     EF 35-40%, echo, May, 2010  . Abdominal aortic aneurysm     AAA And bilateral common iliac artery aneurysms  . Tobacco abuse   . Pulmonary nodule     Chest CT May, 2013, 2 small pulmonary nodules, needs appropriate followup,  this CT was not ordered by the cardiology team  . Preop cardiovascular exam     Cardiac clearance for major vascular surgery, May, 2013    Past Surgical History  Procedure Date  . Coronary artery bypass graft 2008    History   Social History  . Marital Status: Single    Spouse Name: N/A    Number of Children: N/A  . Years of Education: 12   Occupational History  . retired    Social History Main Topics  . Smoking status: Current Everyday Smoker -- 1.0 packs/day    Types: Cigarettes  . Smokeless tobacco: Never Used  . Alcohol Use: No  . Drug Use: No  . Sexually Active: Not on file   Other Topics Concern  . Not on file   Social History Narrative  . No narrative on file    Family History  Problem Relation Age of Onset  . Cancer Father     prostate  . Heart disease Father   . Diabetes Father   . Heart attack Father 102  . Arthritis Sister   . Hyperlipidemia Sister   . Hypertension Sister     No current facility-administered medications on file prior to encounter.   Current Outpatient Prescriptions on  File Prior to Encounter  Medication Sig Dispense Refill  . aspirin 81 MG tablet Take 81 mg by mouth daily.       [name] lisinopril (PRINIVIL,ZESTRIL) 10 MG tablet Take 1 tablet (10 mg total) by mouth daily.  90 tablet  3  . metoprolol succinate (TOPROL XL) 25 MG 24 hr tablet Take 1 tablet (25 mg total) by mouth daily.  90 tablet  3  . omeprazole (PRILOSEC) 10 MG capsule Take 1 capsule (10 mg total) by mouth daily.  90 capsule  3  . rosuvastatin (CRESTOR) 10 MG tablet Take 1 tablet (10 mg total) by mouth daily.  90 tablet  3  . Tamsulosin HCl (FLOMAX) 0.4 MG CAPS Take 1 capsule (0.4 mg total) by mouth daily.  90 capsule  3    No Known Allergies  Review of Systems: Kidney Disease, As listed above, otherwise negative.  Physical Examination  Filed Vitals:   07/26/11 0938  BP: 132/90  Pulse: 75  Temp: 97 F (36.1 C)  TempSrc: Oral  Resp: 18  Height: 6\' 3"  (1.905 m)  Weight: 150 lb (68.04 kg)  SpO2: 97%   Body mass index is 18.75 kg/(m^2).  General: A&O x 3, WDWN, thin  Pulmonary: Sym exp, good air movt, CTAB, no rales, rhonchi, & wheezing  Cardiac: RRR, Nl S1, S2, no Murmurs, rubs or gallops  Gastrointestinal: soft, NTND, -G/R, - HSM, - masses, - CVAT B  Musculoskeletal: M/S 5/5 throughout , Extremities without ischemic changes   Laboratory See iStat  Medical Decision Making  [name] is a 61 y.o. male who presents with: AAA, B CIA aneurysm.   The patient is scheduled for: Aortogram, B pelvic angiogram, L IIA embolization I discussed with the patient the nature of angiographic procedures, especially the limited patencies of any endovascular intervention.  The patient is aware of that the risks of an angiographic procedure include but are not limited to: bleeding, infection, access site complications, renal failure, embolization, rupture of vessel, dissection, possible need for emergent surgical intervention, possible need for surgical procedures to treat the patient's  pathology, and stroke and death.    The patient is aware of the risks and agrees to proceed.  Leonides Sake, MD [facility] [place] Office: [phone] Pager: [phone]  07/26/2011, 11:58 AM

## 2011-07-31 NOTE — Transfer of Care (Signed)
Immediate Anesthesia Transfer of Care Note  Patient: [name]  Procedure(s) Performed: Procedure(s) (LRB): ABDOMINAL AORTIC ENDOVASCULAR STENT GRAFT (N/A)  Patient Location: PACU  Anesthesia Type: General  Level of Consciousness: awake, alert  and oriented  Airway & Oxygen Therapy: Patient Spontanous Breathing  Post-op Assessment: Report given to PACU RN and Post -op Vital signs reviewed and stable  Post vital signs: Reviewed and stable  Complications: No apparent anesthesia complications

## 2011-07-31 NOTE — Anesthesia Postprocedure Evaluation (Signed)
  Anesthesia Post-op Note  Patient: [name]  Procedure(s) Performed: Procedure(s) (LRB): ABDOMINAL AORTIC ENDOVASCULAR STENT GRAFT (N/A)  Patient Location: PACU  Anesthesia Type: General  Level of Consciousness: awake  Airway and Oxygen Therapy: Patient Spontanous Breathing  Post-op Pain: mild  Post-op Assessment: Post-op Vital signs reviewed  Post-op Vital Signs: Reviewed  Complications: No apparent anesthesia complications 

## 2011-07-31 NOTE — Anesthesia Preprocedure Evaluation (Addendum)
Anesthesia Evaluation  Patient identified by MRN, date of birth, ID band Patient awake    Reviewed: Allergy & Precautions, H&P , NPO status , Patient's Chart, lab work & pertinent test results  Airway Mallampati: II TM Distance: >3 FB Neck ROM: Full    Dental  (+) Poor Dentition, Missing and Dental Advisory Given   Pulmonary shortness of breath, COPDCurrent Smoker,  breath sounds clear to auscultation        Cardiovascular hypertension, + CAD and + CABG Rhythm:Regular Rate:Normal     Neuro/Psych PSYCHIATRIC DISORDERS    GI/Hepatic negative GI ROS,   Endo/Other  negative endocrine ROS  Renal/GU negative Renal ROS     Musculoskeletal   Abdominal   Peds  Hematology negative hematology ROS (+)   Anesthesia Other Findings   Reproductive/Obstetrics                          Anesthesia Physical Anesthesia Plan  ASA: III  Anesthesia Plan: General   Post-op Pain Management:    Induction: Intravenous  Airway Management Planned: Oral ETT  Additional Equipment: Arterial line  Intra-op Plan:   Post-operative Plan:   Informed Consent:   Plan Discussed with: CRNA  Anesthesia Plan Comments:        Anesthesia Quick Evaluation

## 2011-07-31 NOTE — Anesthesia Postprocedure Evaluation (Signed)
  Anesthesia Post-op Note  Patient: [name]  Procedure(s) Performed: Procedure(s) (LRB): ABDOMINAL AORTIC ENDOVASCULAR STENT GRAFT (N/A)  Patient Location: PACU  Anesthesia Type: General  Level of Consciousness: awake  Airway and Oxygen Therapy: Patient Spontanous Breathing  Post-op Pain: mild  Post-op Assessment: Post-op Vital signs reviewed  Post-op Vital Signs: Reviewed  Complications: No apparent anesthesia complications

## 2011-07-31 NOTE — Plan of Care (Signed)
Problem: Consults Goal: Aorto Fem Bypass Patient Education (See Patient Education module for education specifics.) Outcome: Not Met (add Reason) Pt. Will be unable to learn due to his mental status, so family will be involved.

## 2011-07-31 NOTE — Op Note (Signed)
NAME: [name]   MRN: [id number] DOB: 1950-04-22    DATE OF OPERATION: 07/31/2011  PREOP DIAGNOSIS: left common iliac artery aneurysm  POSTOP DIAGNOSIS: same  PROCEDURE:  1. Ultrasound-guided access to right common femoral artery 2. Introduction of contralateral iliac limb for EVAR (20mm X 11.5 cm Gore Device) 3. Perclose right common femoral artery  COSURGEONS: Di Kindle. Edilia Bo, MD, Leonides Sake M.D.  ANESTHESIA: Gen.   EBL: minimal  TECHNIQUE: Patient was brought to the operating room and received a general anesthetic. The abdomen and groins were prepped and draped in the usual sterile fashion. On the right side, after the sheath had been placed on the left and the artery preclosed using 2 Perclose devices, under ultrasound guidance the right common femoral artery was cannulated and guidewire introduced into the infrarenal aorta. The tract was dilated with an 8 [place] dilator. A Perclose device was then advanced over the wire and the device deployed at a 30 medial rotation. A second device was then advanced over the wire and the string was deployed at a 30 lateral rotation. The long 8 French sheath was then introduced on the right.  After the trunk ipsilateral component had been placed, the contralateral gait was cannulated using a Kumpe catheter and an angled Glidewire. The cath was then advanced into the graft and the Glidewire exchanged for an Amplatz wire. Catheter was then exchanged for a pigtail catheter which was positioned within the body of the graft and tested to document its position. The wire was then advanced into the thoracic aorta and the position of the wire marked. The contralateral limb was then introduced over the the wire with 3 cm of overlap into the contralateral gate. The sheath had been retracted. Measurement had been obtained using a retrograde iliac arteriogram at a LAO projection. This had been done to demonstrate the position of the hypogastric and preserve  flow into the hypogastric. The contralateral limb was deployed without difficulty.  At the completion the overlap zones within the stent graft on the right were ballooned without difficulty. At the completion the sheath was removed on the right and the Perclose sutures were secured. It appeared to be good hemostasis the wire was removed and the sutures were then secured. There was good hemostasis. The incision in the right groin was closed with 4-0 subcuticular stitch. No immediate complications were noted.  [name], MD, FACS [facility]  DATE OF DICTATION:   07/31/2011

## 2011-08-01 ENCOUNTER — Telehealth: Payer: Self-pay | Admitting: Vascular Surgery

## 2011-08-01 ENCOUNTER — Other Ambulatory Visit: Payer: Self-pay | Admitting: *Deleted

## 2011-08-01 DIAGNOSIS — I714 Abdominal aortic aneurysm, without rupture: Secondary | ICD-10-CM

## 2011-08-01 DIAGNOSIS — Z48812 Encounter for surgical aftercare following surgery on the circulatory system: Secondary | ICD-10-CM

## 2011-08-01 LAB — CBC
HCT: 36.9 % — ABNORMAL LOW (ref 39.0–52.0)
MCV: 91.8 fL (ref 78.0–100.0)
RBC: 4.02 MIL/uL — ABNORMAL LOW (ref 4.22–5.81)
WBC: 8.5 10*3/uL (ref 4.0–10.5)

## 2011-08-01 LAB — BASIC METABOLIC PANEL
BUN: 12 mg/dL (ref 6–23)
CO2: 25 mEq/L (ref 19–32)
Calcium: 9.2 mg/dL (ref 8.4–10.5)
Chloride: 102 mEq/L (ref 96–112)
Creatinine, Ser: 0.89 mg/dL (ref 0.50–1.35)
GFR calc Af Amer: >90 mL/min (ref >90)
GFR calc non Af Amer: 89 mL/min — ABNORMAL LOW (ref >90)

## 2011-08-01 MED ORDER — OXYCODONE HCL 5 MG PO TABS: 5.0000 mg | ORAL_TABLET | Freq: Four times a day (QID) | ORAL | Status: AC | PRN

## 2011-08-01 NOTE — OR Nursing (Signed)
Placed implant description into implant section for implant [id number].

## 2011-08-01 NOTE — Discharge Summary (Signed)
[facility] Discharge Summary  [name] 01/27/1951 61 y.o. male  [id number]  Admission Date: 07/31/2011  Discharge Date: 08/01/11  Physician: Fransisco Hertz, MD  Admission Diagnosis: Abdominal Aortic Aneurysm   HPI:   This is a 61 y.o. male who presents with chief complaint: AAA and B CIA aneurysm. The patient presents today for embolization of L IIA.   Hospital Course:  The patient was admitted to the hospital and taken to the operating room on 07/31/2011 and underwent  1. Bilateral common femoral artery cannulation under ultrasound guidance 2. "Preclose" repair bilateral common femoral artery  3. Placement of catheter in aorta x 2 4. Aortogram 5. Repair of aorta with modular bifurcated prosthesis with one limb 6. Placement of right iliac limb 7. Placement of left iliac limb extension 8. Radiology S&I 9.  The pt tolerated the procedure well and was transported to the PACU in good condition. By POD 1, he was doing well.  The remainder of the hospital course consisted of increasing mobilization and increasing intake of solids without difficulty.  CBC    Component Value Date/Time   WBC 8.5 08/01/2011 0504   RBC 4.02* 08/01/2011 0504   HGB 12.6* 08/01/2011 0504   HCT 36.9* 08/01/2011 0504   PLT 167 08/01/2011 0504   MCV 91.8 08/01/2011 0504   MCH 31.3 08/01/2011 0504   MCHC 34.1 08/01/2011 0504   RDW 14.2 08/01/2011 0504   LYMPHSABS 1.7 04/23/2011 1406   MONOABS 0.6 04/23/2011 1406   EOSABS 0.3 04/23/2011 1406   BASOSABS 0.1 04/23/2011 1406    BMET    Component Value Date/Time   NA 136 08/01/2011 0504   K 4.3 08/01/2011 0504   CL 102 08/01/2011 0504   CO2 25 08/01/2011 0504   GLUCOSE 128* 08/01/2011 0504   BUN 12 08/01/2011 0504   CREATININE 0.89 08/01/2011 0504   CREATININE 0.95 06/22/2011 1016   CALCIUM 9.2 08/01/2011 0504   GFRNONAA 89* 08/01/2011 0504   GFRAA >90 08/01/2011 0504     Discharge Instructions:   The patient is discharged to home with  extensive instructions on wound care and progressive ambulation.  They are instructed not to drive or perform any heavy lifting until returning to see the physician in his office.  Discharge Orders    Future Orders Please Complete By Expires   Resume previous diet      Driving Restrictions      Comments:   No driving for 2 weeks and while taking pain medication   Lifting restrictions      Comments:   No lifting for 6 weeks   Call MD for:  temperature >100.5      Call MD for:  redness, tenderness, or signs of infection (pain, swelling, bleeding, redness, odor or green/yellow discharge around incision site)      Call MD for:  severe or increased pain, loss or decreased feeling  in affected limb(s)      ABDOMINAL PROCEDURE/ANEURYSM REPAIR/AORTO-BIFEMORAL BYPASS:  Call MD for increased abdominal pain; cramping diarrhea; nausea/vomiting      Discharge wound care:      Comments:   Shower daily with soap and water starting 08/02/11       Discharge Diagnosis:  Abdominal Aortic Aneurysm  Secondary Diagnosis: Patient Active Problem List  Diagnoses  . CARDIOMYOPATHY, ISCHEMIC  . CONSTIPATION  . HEMOCCULT POSITIVE STOOL  . CORONARY ARTERY BYPASS GRAFT, HX OF  . Preventative health care  . COPD (chronic obstructive  pulmonary disease)  . Hyperplastic colon polyp  . PVD (peripheral vascular disease)  . Gastritis and duodenitis  . Fatigue  . Bladder neck obstruction  . PSA elevation  . Iliac artery aneurysm, bilateral  . Abdominal aortic aneurysm  . Pulmonary nodule  . Hypertension  . Hyperlipidemia  . CAD (coronary artery disease)  . Hx of CABG  . Ejection fraction  . Tobacco abuse  . Chronic mental illness  . Preop cardiovascular exam   Past Medical History  Diagnosis Date  . Hypertension   . Hyperlipidemia   . Hyperplastic colon polyp 04/21/2011  . PVD (peripheral vascular disease) 04/21/2011  . Chronic mental illness 04/21/2011    Diagnosis unclear,  Family provides good care    . Hx of CABG     May, 2010  . Ejection fraction     EF 35-40%, echo, May, 2010  . Abdominal aortic aneurysm     AAA And bilateral common iliac artery aneurysms  . Tobacco abuse   . Pulmonary nodule     Chest CT May, 2013, 2 small pulmonary nodules, needs appropriate followup,  this CT was not ordered by the cardiology team  . Preop cardiovascular exam     Cardiac clearance for major vascular surgery, May, 2013  . CAD (coronary artery disease)     Dr Myrtis Ser @ [place]   . AAA (abdominal aortic aneurysm)   . COPD (chronic obstructive pulmonary disease) 04/21/2011    smoking  . Shortness of breath       [name], [name]  Home Medication Instructions [id number]:[id number]   Printed on:08/01/11 0734  Medication Information                    aspirin 81 MG tablet Take 81 mg by mouth daily.            lisinopril (PRINIVIL,ZESTRIL) 10 MG tablet Take 1 tablet (10 mg total) by mouth daily.           metoprolol succinate (TOPROL XL) 25 MG 24 hr tablet Take 1 tablet (25 mg total) by mouth daily.           rosuvastatin (CRESTOR) 10 MG tablet Take 1 tablet (10 mg total) by mouth daily.           omeprazole (PRILOSEC) 10 MG capsule Take 1 capsule (10 mg total) by mouth daily.           Tamsulosin HCl (FLOMAX) 0.4 MG CAPS Take 1 capsule (0.4 mg total) by mouth daily.           oxyCODONE (ROXICODONE) 5 MG immediate release tablet Take 1 tablet (5 mg total) by mouth every 6 (six) hours as needed for pain. #30 NR            Disposition: home  Patient's condition: is Good  Follow up: 1. Dr. Imogene Burn in 4 weeks   Doreatha Massed, PA-C [facility] [phone] 08/01/2011  7:34 AM  Addendum  I have independently interviewed and examined the patient, and I agree with the physician assistant's discharge summary.  This patient underwent an uneventful percutaneous EVAR and had an uneventful post-operative course and will be discharge POD #1.  Follow up in the office in 4 weeks  with a CTA Abd/pelvis to re-evaluate the aorta after EVAR (per FDA recommendations).  Leonides Sake, MD [facility] [place] Office: [phone] Pager: [phone]  08/01/2011, 8:30 AM

## 2011-08-01 NOTE — Progress Notes (Signed)
Utilization review completed.  

## 2011-08-01 NOTE — Progress Notes (Signed)
Patient d/c'd home with sister. Patient and Sister given d/c instructions and prescriptions and all questions answered. Patient's VSS and assessment WNL. Patient d/c'd via wheelchair with NS/MT and family.

## 2011-08-01 NOTE — Telephone Encounter (Signed)
Message copied by Fredrich Birks on Wed Aug 01, 2011  3:00 PM ------      Message from: [place], [place] K      Created: Wed Aug 01, 2011 11:10 AM      Regarding: schedule                   ----- Message -----         From: Dara Lords, PA         Sent: 08/01/2011   7:35 AM           To: Sharee Pimple, CMA            Pt had EVAR yesterday by [facility].            F/u with [facility] in 4 weeks with CT Scan (I have not ordered this).            Thanks!            Lelon Mast

## 2011-08-01 NOTE — Progress Notes (Signed)
[facility]   Daily Progress Note  Assessment/Planning: POD #1 s/p perc. EVAR   All lines out, foley out  D/C home later  Subjective  - 1 Day Post-Op  Mild soreness at puncture site  Objective Filed Vitals:   07/31/11 2337 08/01/11 0000 08/01/11 0336 08/01/11 0400  BP:  151/96  137/83  Pulse:  74  79  Temp: 98.2 F (36.8 C)  98.9 F (37.2 C)   TempSrc: Axillary  Oral   Resp:  17  16  Height:      Weight:      SpO2:  95%  99%    Intake/Output Summary (Last 24 hours) at 08/01/11 0651 Last data filed at 08/01/11 0600  Gross per 24 hour  Intake 3607.5 ml  Output   5530 ml  Net -1922.5 ml    PULM  CTAB CV  RRR GI  soft, NTND VASC  B groin without hematoma, both feet well perfused with faintly palpable PT on R, palpable PT on L  Laboratory CBC    Component Value Date/Time   WBC 8.5 08/01/2011 0504   HGB 12.6* 08/01/2011 0504   HCT 36.9* 08/01/2011 0504   PLT 167 08/01/2011 0504    BMET    Component Value Date/Time   NA 136 08/01/2011 0504   K 4.3 08/01/2011 0504   CL 102 08/01/2011 0504   CO2 25 08/01/2011 0504   GLUCOSE 128* 08/01/2011 0504   BUN 12 08/01/2011 0504   CREATININE 0.89 08/01/2011 0504   CREATININE 0.95 06/22/2011 1016   CALCIUM 9.2 08/01/2011 0504   GFRNONAA 89* 08/01/2011 0504   GFRAA >90 08/01/2011 0504    Leonides Sake, MD [facility] [place] Office: [phone] Pager: [phone]  08/01/2011, 6:51 AM

## 2011-08-02 ENCOUNTER — Other Ambulatory Visit (HOSPITAL_COMMUNITY): Payer: Self-pay | Admitting: Vascular Surgery

## 2011-08-02 DIAGNOSIS — T82330A Leakage of aortic (bifurcation) graft (replacement), initial encounter: Secondary | ICD-10-CM

## 2011-08-03 ENCOUNTER — Other Ambulatory Visit: Payer: Self-pay | Admitting: *Deleted

## 2011-08-03 ENCOUNTER — Telehealth: Payer: Self-pay | Admitting: Vascular Surgery

## 2011-08-03 DIAGNOSIS — I714 Abdominal aortic aneurysm, without rupture: Secondary | ICD-10-CM

## 2011-08-03 NOTE — Telephone Encounter (Signed)
Patients wife called back and confirmed appointment with me, dpm

## 2011-08-03 NOTE — Telephone Encounter (Signed)
Message copied by Fredrich Birks on Fri Aug 03, 2011  9:47 AM ------      Message from: [place], [place] K      Created: Wed Aug 01, 2011 11:10 AM      Regarding: schedule                   ----- Message -----         From: Dara Lords, PA         Sent: 08/01/2011   7:35 AM           To: Sharee Pimple, CMA            Pt had EVAR yesterday by [facility].            F/u with [facility] in 4 weeks with CT Scan (I have not ordered this).            Thanks!            Lelon Mast

## 2011-08-03 NOTE — Telephone Encounter (Signed)
Malachi Bonds notified of all appointment instructions for CTA and follow up with [facility], dpm

## 2011-08-06 ENCOUNTER — Emergency Department (HOSPITAL_COMMUNITY)
Admission: EM | Admit: 2011-08-06 | Discharge: 2011-08-06 | Disposition: A | Discharge status: Home / Self Care | Payer: Medicare Other | Attending: Emergency Medicine | Admitting: Emergency Medicine

## 2011-08-06 ENCOUNTER — Encounter (HOSPITAL_COMMUNITY): Payer: Self-pay | Admitting: *Deleted

## 2011-08-06 DIAGNOSIS — I2510 Atherosclerotic heart disease of native coronary artery without angina pectoris: Secondary | ICD-10-CM | POA: Insufficient documentation

## 2011-08-06 DIAGNOSIS — I10 Essential (primary) hypertension: Secondary | ICD-10-CM | POA: Insufficient documentation

## 2011-08-06 DIAGNOSIS — J4489 Other specified chronic obstructive pulmonary disease: Secondary | ICD-10-CM | POA: Insufficient documentation

## 2011-08-06 DIAGNOSIS — N400 Enlarged prostate without lower urinary tract symptoms: Secondary | ICD-10-CM | POA: Diagnosis not present

## 2011-08-06 DIAGNOSIS — I739 Peripheral vascular disease, unspecified: Secondary | ICD-10-CM | POA: Insufficient documentation

## 2011-08-06 DIAGNOSIS — N3289 Other specified disorders of bladder: Secondary | ICD-10-CM | POA: Diagnosis not present

## 2011-08-06 DIAGNOSIS — J449 Chronic obstructive pulmonary disease, unspecified: Secondary | ICD-10-CM | POA: Insufficient documentation

## 2011-08-06 DIAGNOSIS — E785 Hyperlipidemia, unspecified: Secondary | ICD-10-CM | POA: Diagnosis not present

## 2011-08-06 DIAGNOSIS — R339 Retention of urine, unspecified: Secondary | ICD-10-CM | POA: Insufficient documentation

## 2011-08-06 DIAGNOSIS — Z951 Presence of aortocoronary bypass graft: Secondary | ICD-10-CM | POA: Insufficient documentation

## 2011-08-06 DIAGNOSIS — F17200 Nicotine dependence, unspecified, uncomplicated: Secondary | ICD-10-CM | POA: Diagnosis not present

## 2011-08-06 LAB — URINALYSIS, ROUTINE W REFLEX MICROSCOPIC
Bilirubin Urine: NEGATIVE
Nitrite: NEGATIVE
Specific Gravity, Urine: 1.015 (ref 1.005–1.030)
pH: 5.5 (ref 5.0–8.0)

## 2011-08-06 MED ORDER — POLYETHYLENE GLYCOL 3350 17 GM/SCOOP PO POWD: 17.0000 g | Freq: Every day | ORAL | Status: AC

## 2011-08-06 NOTE — Discharge Instructions (Signed)
Acute Urinary Retention, Male  Acute urinary retention is when you are unable to pee (urinate). This is a common in older males. Prostates can get bigger, which blocks the flow of pee.   HOME CARE   Only take medicine as told by your doctor.   If you are sent home with a tube that drains the bladder (catheter), there will be a drainage bag attached to it.   Keep the drainage bag empty.   Keep the drainage bag lower than your catheter.   Drink enough fluids to keep your pee clear or pale yellow.   Keep all follow-up appointments.  GET HELP RIGHT AWAY IF:    You have chills.   You develop a fever.   You do not feel well.  MAKE SURE YOU:    Understand these instructions.   Will watch your condition.   Will get help right away if you are not doing well or get worse.  Document Released: 08/15/2007 Document Revised: 02/15/2011 Document Reviewed: 08/15/2007  Exit[employer] Patient Information 2012 [employer].

## 2011-08-06 NOTE — ED Provider Notes (Signed)
  Physical Exam  BP 149/97  Pulse 84  Temp(Src) 98.6 F (37 C) (Oral)  Resp 18  Wt 150 lb (68.04 kg)  SpO2 98%  Physical Exam  ED Course  Procedures  MDM Followed up on urinalysis. No infection. Patient continues to have urine output through the Foley. he'll be discharged home.      Juliet Rude. Rubin Payor, MD 08/06/11 [phone]

## 2011-08-06 NOTE — ED Provider Notes (Signed)
History     CSN: [id number]  Arrival date & time 08/06/11  1301   First MD Initiated Contact with Patient 08/06/11 1419      Chief Complaint  Patient presents with  . Constipation    (Consider location/radiation/quality/duration/timing/severity/associated sxs/prior treatment) HPI Comments: [name] is a 61 y.o. Male who is here complaining of constipation and decreased urination for several days. He recently had bilateral groin surgery for, arterial bypass. He denies fever, chills, nausea, vomiting, weakness, or dizziness. He's not had a bleeding or pain from the incisions. He is using hydrocodone without relief. He had decreased appetite.  Patient is a 61 y.o. male presenting with constipation. The history is provided by the patient.  Constipation     Past Medical History  Diagnosis Date  . Hypertension   . Hyperlipidemia   . Hyperplastic colon polyp 04/21/2011  . PVD (peripheral vascular disease) 04/21/2011  . Chronic mental illness 04/21/2011    Diagnosis unclear,  Family provides good care  . Hx of CABG     May, 2010  . Ejection fraction     EF 35-40%, echo, May, 2010  . Abdominal aortic aneurysm     AAA And bilateral common iliac artery aneurysms  . Tobacco abuse   . Pulmonary nodule     Chest CT May, 2013, 2 small pulmonary nodules, needs appropriate followup,  this CT was not ordered by the cardiology team  . Preop cardiovascular exam     Cardiac clearance for major vascular surgery, May, 2013  . CAD (coronary artery disease)     Dr Myrtis Ser @ [facility]   . AAA (abdominal aortic aneurysm)   . COPD (chronic obstructive pulmonary disease) 04/21/2011    smoking  . Shortness of breath     Past Surgical History  Procedure Date  . Coronary artery bypass graft 2008    Family History  Problem Relation Age of Onset  . Cancer Father     prostate  . Heart disease Father   . Diabetes Father   . Heart attack Father 66  . Arthritis Sister   . Hyperlipidemia Sister   .  Hypertension Sister   . Anesthesia problems Neg Hx     History  Substance Use Topics  . Smoking status: Current Everyday Smoker -- 1.0 packs/day    Types: Cigarettes  . Smokeless tobacco: Never Used  . Alcohol Use: No      Review of Systems  Gastrointestinal: Positive for constipation.  All other systems reviewed and are negative.    Allergies  Review of patient's allergies indicates no known allergies.  Home Medications   Current Outpatient Rx  Name Route Sig Dispense Refill  . ASPIRIN 81 MG PO TABS Oral Take 81 mg by mouth daily.     [name] LISINOPRIL 10 MG PO TABS Oral Take 1 tablet (10 mg total) by mouth daily. 90 tablet 3  . METOPROLOL SUCCINATE ER 25 MG PO TB24 Oral Take 1 tablet (25 mg total) by mouth daily. 90 tablet 3  . OMEPRAZOLE 10 MG PO CPDR Oral Take 1 capsule (10 mg total) by mouth daily. 90 capsule 3  . OXYCODONE HCL 5 MG PO TABS Oral Take 1 tablet (5 mg total) by mouth every 6 (six) hours as needed for pain. 30 tablet 0  . ROSUVASTATIN CALCIUM 10 MG PO TABS Oral Take 1 tablet (10 mg total) by mouth daily. 90 tablet 3  . TAMSULOSIN HCL 0.4 MG PO CAPS Oral Take 1  capsule (0.4 mg total) by mouth daily. 90 capsule 3  . POLYETHYLENE GLYCOL 3350 PO POWD Oral Take 17 g by mouth daily. 255 g 0    BP 160/97  Pulse 79  Temp(Src) 98.6 F (37 C) (Oral)  Resp 18  Wt 150 lb (68.04 kg)  SpO2 97%  Physical Exam  Nursing note and vitals reviewed. Constitutional: He is oriented to person, place, and time. He appears well-developed and well-nourished.  HENT:  Head: Normocephalic and atraumatic.  Right Ear: External ear normal.  Left Ear: External ear normal.  Eyes: Conjunctivae and EOM are normal. Pupils are equal, round, and reactive to light.  Neck: Normal range of motion and phonation normal. Neck supple.  Cardiovascular: Normal rate, regular rhythm, normal heart sounds and intact distal pulses.   Pulmonary/Chest: Effort normal and breath sounds normal. He exhibits  no bony tenderness.  Abdominal: Soft. Normal appearance.       Bladder is distended, almost to the umbilicus  Genitourinary:       Rectal examination reveals 4+ enlarged prostate that is nontender  Musculoskeletal: Normal range of motion.       Bilateral groin wounds from surgery have op site attached, incisions are well approximated, without bleeding or drainage. There is no associated fluctuance, or swelling.  Neurological: He is alert and oriented to person, place, and time. He has normal strength. No cranial nerve deficit or sensory deficit. He exhibits normal muscle tone. Coordination normal.  Skin: Skin is warm, dry and intact.  Psychiatric: He has a normal mood and affect. His behavior is normal. Judgment and thought content normal.    ED Course  Procedures (including critical care time)  Labs Reviewed  URINALYSIS, ROUTINE W REFLEX MICROSCOPIC - Abnormal; Notable for the following:    Hgb urine dipstick TRACE (*)    All other components within normal limits  URINE CULTURE  URINE MICROSCOPIC-ADD ON  LAB REPORT - SCANNED   No results found.   1. Urinary retention   2. Prostatic hypertrophy       MDM  Urinary retention d/t prostatic hypertrophy. No UTI or evident constipation. Healing post-op vascular wounds. Doubt metabolic instability, serious bacterial infection or impending vascular collapse; the patient is stable for discharge.  Plan: Home Medications- usual; Home Treatments- d/c with Foley; Recommended follow up- Urology f/u in 3-5 days        Adrian Melter, MD 08/08/11 [phone]

## 2011-08-06 NOTE — ED Notes (Signed)
Pt states "constipated for 2 weeks, had bypass surgery last week"

## 2011-08-07 LAB — URINE CULTURE: Culture  Setup Time: 201305280125

## 2011-08-08 DIAGNOSIS — N401 Enlarged prostate with lower urinary tract symptoms: Secondary | ICD-10-CM | POA: Diagnosis not present

## 2011-08-08 DIAGNOSIS — R339 Retention of urine, unspecified: Secondary | ICD-10-CM | POA: Diagnosis not present

## 2011-08-10 DIAGNOSIS — R339 Retention of urine, unspecified: Secondary | ICD-10-CM | POA: Diagnosis not present

## 2011-08-20 DIAGNOSIS — R339 Retention of urine, unspecified: Secondary | ICD-10-CM | POA: Diagnosis not present

## 2011-08-20 DIAGNOSIS — N401 Enlarged prostate with lower urinary tract symptoms: Secondary | ICD-10-CM | POA: Diagnosis not present

## 2011-08-30 ENCOUNTER — Encounter: Payer: Self-pay | Admitting: Vascular Surgery

## 2011-08-31 ENCOUNTER — Ambulatory Visit
Admission: RE | Admit: 2011-08-31 | Discharge: 2011-08-31 | Disposition: A | Discharge status: Home / Self Care | Payer: Medicare Other | Source: Ambulatory Visit | Attending: Vascular Surgery | Admitting: Vascular Surgery

## 2011-08-31 ENCOUNTER — Encounter: Payer: Self-pay | Admitting: Vascular Surgery

## 2011-08-31 ENCOUNTER — Ambulatory Visit (INDEPENDENT_AMBULATORY_CARE_PROVIDER_SITE_OTHER): Payer: Medicare Other | Admitting: Vascular Surgery

## 2011-08-31 VITALS — BP 175/96 | HR 49 | Resp 14 | Ht 75.5 in | Wt 141.1 lb

## 2011-08-31 DIAGNOSIS — Z48812 Encounter for surgical aftercare following surgery on the circulatory system: Secondary | ICD-10-CM

## 2011-08-31 DIAGNOSIS — I714 Abdominal aortic aneurysm, without rupture, unspecified: Secondary | ICD-10-CM | POA: Insufficient documentation

## 2011-08-31 DIAGNOSIS — I723 Aneurysm of iliac artery: Secondary | ICD-10-CM | POA: Insufficient documentation

## 2011-08-31 MED ORDER — IOHEXOL 350 MG/ML SOLN
100.0000 mL | Freq: Once | INTRAVENOUS | Status: AC | PRN
  Administered 2011-08-31: 100 mL via INTRAVENOUS

## 2011-08-31 NOTE — Progress Notes (Signed)
[facility]  Post-operative EVAR  History of Present Illness  [name] is a 61 y.o. male who presents post-op s/p EVAR (Date: 07/31/11) for small AAA and B CIA aneurysm.  Most recent CTA (Date: 08/31/11) demonstrates: no endoleak and stable sac size.  The patient has no back or abdominal pain.  He had to return to the ER post-op with urinary retention.  He has a known h/o prostate issues and currently under the care of a urologist.  Past Medical History, Past Surgical History, Social History, Family History, Medications, Allergies, and Review of Systems are unchanged from previous evaluation on 07/31/11.  Physical Examination  Filed Vitals:   08/31/11 1431  BP: 175/96  Pulse: 49  Resp: 14    Vascular: Vessel Right Left  Radial Palpable Palpable  [place] Palpable Palpable  Brachial Palpable Palpable  Carotid Palpable, without bruit Palpable, without bruit  Aorta Non-palpable N/A  Femoral Palpable Palpable  Popliteal Non-palpable Non-palpable  PT Palpable Palpable  DP Palpable Palpable   Gastrointestinal: soft, NTND, -G/R, - HSM, - masses, - CVAT B, no pulsatile masses, B groin punctures healed  Non-Invasive Vascular Imaging  CTA Abd & pelvis (Date: 08/31/11)  Satisfactory appearance of aortobi-iliac stent graft without evidence of Endo leak or enlarging aneurysm sacs.   Limbs of the graft are patent.   Landing zones occur in the right common iliac artery and left external iliac artery after left internal iliac artery successful coil embolization.  Based on my review of this patient's CTA, he has successful exclusion of his AAA and B CIA aneurysm.  There is no evidence of endoleak.  Sac size is not appreciably different though there are already some signs of iliac sac shrinkage on the radiology msmt.    Medical Decision Making  [name] is a 61 y.o. male who presents s/p EVAR.  Pt is asymptomatic with decreasing iliac sac size and  stable AAA sac size.  I discussed with the patient the importance of surveillance of the endograft.  The next endograft duplex will be scheduled for 3 months.  The next CT will be scheduled for 12 months.  The patient will follow up with [place] in 3 months with these studies.  Thank you for allowing [place] to participate in this patient's care.  Leonides Sake, MD [facility] Office: [phone] Pager: [phone]

## 2011-09-17 DIAGNOSIS — R339 Retention of urine, unspecified: Secondary | ICD-10-CM | POA: Diagnosis not present

## 2011-09-26 DIAGNOSIS — N401 Enlarged prostate with lower urinary tract symptoms: Secondary | ICD-10-CM | POA: Diagnosis not present

## 2011-09-26 DIAGNOSIS — R339 Retention of urine, unspecified: Secondary | ICD-10-CM | POA: Diagnosis not present

## 2011-10-15 DIAGNOSIS — N402 Nodular prostate without lower urinary tract symptoms: Secondary | ICD-10-CM | POA: Diagnosis not present

## 2011-10-15 DIAGNOSIS — N401 Enlarged prostate with lower urinary tract symptoms: Secondary | ICD-10-CM | POA: Diagnosis not present

## 2011-10-15 DIAGNOSIS — R339 Retention of urine, unspecified: Secondary | ICD-10-CM | POA: Diagnosis not present

## 2011-10-15 DIAGNOSIS — R972 Elevated prostate specific antigen [PSA]: Secondary | ICD-10-CM | POA: Diagnosis not present

## 2011-10-17 ENCOUNTER — Telehealth: Payer: Self-pay | Admitting: *Deleted

## 2011-10-17 NOTE — Telephone Encounter (Signed)
Dr. Belva Crome office needs surgical clearance for TURP and Prostate Biopsy and permission to stop ASA 5 days before surgery. [phone])

## 2011-10-17 NOTE — Telephone Encounter (Signed)
Ok for [employer] as planned;  Pt was recently cleared per cardiology and had other recent surgury as well

## 2011-10-18 NOTE — Telephone Encounter (Signed)
Yes  OK to hold ASA 5 days prior to surgury

## 2011-10-18 NOTE — Telephone Encounter (Signed)
Called [facility] left detailed message ok for surgery and to hold asa for 5 days prior to surgery.

## 2011-10-18 NOTE — Telephone Encounter (Signed)
They need surgury ok as well as ok to stop asa 5 days before surgery please advise

## 2011-10-19 ENCOUNTER — Other Ambulatory Visit: Payer: Self-pay | Admitting: Urology

## 2011-11-23 ENCOUNTER — Encounter (HOSPITAL_COMMUNITY): Payer: Self-pay | Admitting: Pharmacy Technician

## 2011-11-26 ENCOUNTER — Encounter (HOSPITAL_COMMUNITY)
Admission: RE | Admit: 2011-11-26 | Discharge: 2011-11-26 | Disposition: A | Discharge status: Home / Self Care | Payer: Medicare Other | Source: Ambulatory Visit | Attending: Urology | Admitting: Urology

## 2011-11-26 ENCOUNTER — Encounter (HOSPITAL_COMMUNITY): Payer: Self-pay

## 2011-11-26 ENCOUNTER — Other Ambulatory Visit (HOSPITAL_COMMUNITY): Payer: Medicare Other

## 2011-11-26 DIAGNOSIS — N320 Bladder-neck obstruction: Secondary | ICD-10-CM | POA: Diagnosis not present

## 2011-11-26 DIAGNOSIS — Z951 Presence of aortocoronary bypass graft: Secondary | ICD-10-CM | POA: Diagnosis not present

## 2011-11-26 DIAGNOSIS — Z23 Encounter for immunization: Secondary | ICD-10-CM | POA: Diagnosis not present

## 2011-11-26 DIAGNOSIS — Z01812 Encounter for preprocedural laboratory examination: Secondary | ICD-10-CM | POA: Diagnosis not present

## 2011-11-26 DIAGNOSIS — R972 Elevated prostate specific antigen [PSA]: Secondary | ICD-10-CM | POA: Diagnosis not present

## 2011-11-26 DIAGNOSIS — I252 Old myocardial infarction: Secondary | ICD-10-CM | POA: Diagnosis not present

## 2011-11-26 DIAGNOSIS — I10 Essential (primary) hypertension: Secondary | ICD-10-CM | POA: Diagnosis not present

## 2011-11-26 DIAGNOSIS — E785 Hyperlipidemia, unspecified: Secondary | ICD-10-CM | POA: Diagnosis not present

## 2011-11-26 DIAGNOSIS — N3000 Acute cystitis without hematuria: Secondary | ICD-10-CM | POA: Diagnosis not present

## 2011-11-26 DIAGNOSIS — N138 Other obstructive and reflux uropathy: Secondary | ICD-10-CM | POA: Diagnosis not present

## 2011-11-26 DIAGNOSIS — N401 Enlarged prostate with lower urinary tract symptoms: Secondary | ICD-10-CM | POA: Diagnosis not present

## 2011-11-26 DIAGNOSIS — R339 Retention of urine, unspecified: Secondary | ICD-10-CM | POA: Diagnosis not present

## 2011-11-26 DIAGNOSIS — Z7982 Long term (current) use of aspirin: Secondary | ICD-10-CM | POA: Diagnosis not present

## 2011-11-26 DIAGNOSIS — Z79899 Other long term (current) drug therapy: Secondary | ICD-10-CM | POA: Diagnosis not present

## 2011-11-26 DIAGNOSIS — J449 Chronic obstructive pulmonary disease, unspecified: Secondary | ICD-10-CM | POA: Diagnosis not present

## 2011-11-26 HISTORY — DX: Retention of urine, unspecified: R33.9

## 2011-11-26 HISTORY — DX: Benign prostatic hyperplasia without lower urinary tract symptoms: N40.0

## 2011-11-26 LAB — BASIC METABOLIC PANEL
BUN: 18 mg/dL (ref 6–23)
CO2: 27 mEq/L (ref 19–32)
Chloride: 107 mEq/L (ref 96–112)
Creatinine, Ser: 1.04 mg/dL (ref 0.50–1.35)
Glucose, Bld: 68 mg/dL — ABNORMAL LOW (ref 70–99)

## 2011-11-26 LAB — CBC
HCT: 39 % (ref 39.0–52.0)
Hemoglobin: 12.7 g/dL — ABNORMAL LOW (ref 13.0–17.0)
MCV: 92.4 fL (ref 78.0–100.0)
RBC: 4.22 MIL/uL (ref 4.22–5.81)
RDW: 14.9 % (ref 11.5–15.5)
WBC: 5.1 10*3/uL (ref 4.0–10.5)

## 2011-11-26 NOTE — Patient Instructions (Addendum)
20 [name]  11/26/2011   Your procedure is scheduled on: 11/29/11 AT 10:00 AM  Report to SHORT STAY DEPT  At 7:30 AM.  Call this number if you have problems the morning of surgery: [phone]   Remember:   Do not eat food or drink liquids AFTER MIDNIGHT    Take these medicines the morning of surgery with A SIP OF WATER: CIPRO / TOPROL / OMEPRAZOLE   Do not wear jewelry, make-up or nail polish.  Do not wear lotions, powders, or perfumes.   Do not shave legs or underarms 48 hrs. before surgery (men may shave face)  Do not bring valuables to the hospital.  Contacts, dentures or bridgework may not be worn into surgery.  Leave suitcase in the car. After surgery it may be brought to your room.  For patients admitted to the hospital, checkout time is 11:00 AM the day of discharge.   Patients discharged the day of surgery will not be allowed to drive home. If going home same day of surgery, must have someone stay with you first 24 hrs at home and arrange for some one to drive you home from hospital.    Special Instructions:   Please read over the following fact sheets that you were given: MRSA  Information               SHOWER WITH BETASEPT THE NIGHT BEFORE SURGERY AND THE MORNING OF SURGERY              USE FLEET ENEMA THE MORNING OF SURGERY          X_______________________________________________________________

## 2011-11-28 NOTE — H&P (Signed)
ctive Problems Problems  1. Acute Cystitis 595.0 2. Acute Urinary Retention 788.20 3. Benign Prostatic Hypertrophy With Urinary Obstruction 600.01 4. Prostate Hard Area Or Nodule 600.10 5. PSA,Elevated 790.93  History of Present Illness  [name] returns today in f/u from his UDS which showed a hyposensitive, unstable bladder with outlet obstruction and an elevated PVR.   He has a foley back in.   He is going to need a TURP and prostate [place] with biopsy for his elevated PSA, nodular prostate and retention.   Past Medical History Problems  1. History of  Acute Myocardial Infarction V12.59 2. History of  Aneurysm Of The Abdominal Aorta 441.4 3. History of  Chronic Obstructive Pulmonary Disease 496 4. History of  Depression 311 5. History of  Hyperlipidemia 272.4 6. History of  Hypertension 401.9 7. History of  Peripheral Vascular Disease 443.9  Surgical History Problems  1. History of  CABG (CABG) V45.81 2. History of  Surgery For Abdominal Aortic Aneurysm  Current Meds 1. Aspirin 325 MG Oral Tablet; Therapy: (Recorded:27Mar2013) to 2. Crestor 10 MG Oral Tablet; Therapy: 27Mar2013 to 3. Finasteride 5 MG Oral Tablet; TAKE 1 TABLET DAILY AS DIRECTED; Therapy: 10Jun2013 to  (Evaluate:05Jun2014)  Requested for: 10Jun2013; Last Rx:10Jun2013 4. Lisinopril 10 MG Oral Tablet; Therapy: 27Mar2013 to 5. Metoprolol Succinate ER 25 MG Oral Tablet Extended Release 24 Hour; Therapy: 11Feb2013 to 6. Metoprolol Tartrate 50 MG Oral Tablet; Therapy: 27Mar2013 to 7. Tamsulosin HCl 0.4 MG Oral Capsule; Therapy: 27Mar2013 to  Allergies Medication  1. No Known Drug Allergies  Family History Problems  1. Family history of  Death In The Family Father Died age 53-Heart and prostate 2. Family history of  Death In The Family Mother Died in her 55's from childbirth 3. Paternal history of  Heart Disease V17.49 4. Paternal history of  Prostate Cancer V16.42  Social History Problems  1. Caffeine  Use 2 per day 2. Marital History - Single 3. Tobacco Use 305.1 1 ppd for 18 yrs 4. Unemployed Denied  5. History of  Alcohol Use  Review of Systems  Constitutional: no fever.  Cardiovascular: no chest pain.  Respiratory: no shortness of breath.    Vitals Vital Signs [Data Includes: Last 1 Day]  05Aug2013 01:21PM  Blood Pressure: 130 / 79 Temperature: 98.4 F Heart Rate: 83  Physical Exam Constitutional: Well nourished and well developed . No acute distress.  Pulmonary: No respiratory distress and normal respiratory rhythm and effort.  Cardiovascular: Heart rate and rhythm are normal . No peripheral edema.    Results/Data  The following images/tracing/specimen were independently visualized:  Urodynamic tracing, flouro and report reviewed.    Assessment Assessed  1. Benign Prostatic Hypertrophy With Urinary Obstruction 600.01 2. Prostate Hard Area Or Nodule 600.10 3. PSA,Elevated 790.93 4. Acute Urinary Retention 788.20   He has outlet obstruction with a hyposensitive unstable bladder. He needs evaluation for his nodular prostate and elevated PSA.   Plan Benign Prostatic Hypertrophy With Urinary Obstruction (600.01)  1. Ciprofloxacin HCl 500 MG Oral Tablet; Take 1 tablet twice daily; Therapy: 05Aug2013 to  (Evaluate:12Aug2013); Last Rx:05Aug2013 PSA,Elevated (790.93), Benign Prostatic Hypertrophy With Urinary Obstruction (600.01)  2. Follow-up Schedule Surgery Office  Follow-up  Requested for: 05Aug2013   I am going to schedule him for a prostate [place] and biospy and TURP and have reviewed the risks of bleeding, infection, incontinence, strictures, bladder injury, persistent retention, thrombotic events and anesthetic complications. He will be started on cipro 3 days preop. I  will have him cleared by Dr. Jonny Coleman   Discussion/Summary  CC: Dr. Oliver Barre.

## 2011-11-29 ENCOUNTER — Encounter (HOSPITAL_COMMUNITY)
Admission: RE | Disposition: A | Discharge status: Home / Self Care | Payer: Self-pay | Source: Ambulatory Visit | Attending: Urology

## 2011-11-29 ENCOUNTER — Encounter (HOSPITAL_COMMUNITY): Payer: Self-pay | Admitting: Anesthesiology

## 2011-11-29 ENCOUNTER — Encounter (HOSPITAL_COMMUNITY): Payer: Self-pay | Admitting: *Deleted

## 2011-11-29 ENCOUNTER — Ambulatory Visit (HOSPITAL_COMMUNITY)
Admission: RE | Admit: 2011-11-29 | Discharge: 2011-11-30 | Disposition: A | Discharge status: Home / Self Care | Payer: Medicare Other | Source: Ambulatory Visit | Attending: Urology | Admitting: Urology

## 2011-11-29 ENCOUNTER — Ambulatory Visit (HOSPITAL_COMMUNITY): Payer: Medicare Other | Admitting: Anesthesiology

## 2011-11-29 DIAGNOSIS — R0602 Shortness of breath: Secondary | ICD-10-CM | POA: Diagnosis not present

## 2011-11-29 DIAGNOSIS — R339 Retention of urine, unspecified: Secondary | ICD-10-CM | POA: Insufficient documentation

## 2011-11-29 DIAGNOSIS — N403 Nodular prostate with lower urinary tract symptoms: Secondary | ICD-10-CM | POA: Insufficient documentation

## 2011-11-29 DIAGNOSIS — R972 Elevated prostate specific antigen [PSA]: Secondary | ICD-10-CM | POA: Insufficient documentation

## 2011-11-29 DIAGNOSIS — N401 Enlarged prostate with lower urinary tract symptoms: Secondary | ICD-10-CM | POA: Insufficient documentation

## 2011-11-29 DIAGNOSIS — N138 Other obstructive and reflux uropathy: Secondary | ICD-10-CM | POA: Diagnosis not present

## 2011-11-29 DIAGNOSIS — I252 Old myocardial infarction: Secondary | ICD-10-CM | POA: Insufficient documentation

## 2011-11-29 DIAGNOSIS — J4489 Other specified chronic obstructive pulmonary disease: Secondary | ICD-10-CM | POA: Insufficient documentation

## 2011-11-29 DIAGNOSIS — Z7982 Long term (current) use of aspirin: Secondary | ICD-10-CM | POA: Insufficient documentation

## 2011-11-29 DIAGNOSIS — J449 Chronic obstructive pulmonary disease, unspecified: Secondary | ICD-10-CM | POA: Diagnosis not present

## 2011-11-29 DIAGNOSIS — Z79899 Other long term (current) drug therapy: Secondary | ICD-10-CM | POA: Insufficient documentation

## 2011-11-29 DIAGNOSIS — I2510 Atherosclerotic heart disease of native coronary artery without angina pectoris: Secondary | ICD-10-CM | POA: Diagnosis not present

## 2011-11-29 DIAGNOSIS — N320 Bladder-neck obstruction: Secondary | ICD-10-CM | POA: Diagnosis not present

## 2011-11-29 DIAGNOSIS — N3000 Acute cystitis without hematuria: Secondary | ICD-10-CM | POA: Diagnosis not present

## 2011-11-29 DIAGNOSIS — Z951 Presence of aortocoronary bypass graft: Secondary | ICD-10-CM | POA: Insufficient documentation

## 2011-11-29 DIAGNOSIS — I10 Essential (primary) hypertension: Secondary | ICD-10-CM | POA: Insufficient documentation

## 2011-11-29 DIAGNOSIS — E785 Hyperlipidemia, unspecified: Secondary | ICD-10-CM | POA: Insufficient documentation

## 2011-11-29 DIAGNOSIS — Z01812 Encounter for preprocedural laboratory examination: Secondary | ICD-10-CM | POA: Insufficient documentation

## 2011-11-29 DIAGNOSIS — N411 Chronic prostatitis: Secondary | ICD-10-CM | POA: Diagnosis not present

## 2011-11-29 HISTORY — PX: TRANSURETHRAL RESECTION OF PROSTATE: SHX73

## 2011-11-29 SURGERY — TRANSURETHRAL RESECTION OF THE PROSTATE WITH GYRUS INSTRUMENTS
Anesthesia: General | Wound class: Clean Contaminated

## 2011-11-29 MED ORDER — INFLUENZA VIRUS VACC SPLIT PF IM SUSP
0.5000 mL | INTRAMUSCULAR | Status: AC
  Administered 2011-11-30: 0.5 mL via INTRAMUSCULAR
  Filled 2011-11-29: qty 0.5

## 2011-11-29 MED ORDER — KCL IN DEXTROSE-NACL 20-5-0.45 MEQ/L-%-% IV SOLN
INTRAVENOUS | Status: DC
  Administered 2011-11-29 (×2): via INTRAVENOUS
  Filled 2011-11-29 (×4): qty 1000

## 2011-11-29 MED ORDER — LACTATED RINGERS IV SOLN
INTRAVENOUS | Status: DC | PRN
  Administered 2011-11-29: 09:00:00 via INTRAVENOUS

## 2011-11-29 MED ORDER — ZOLPIDEM TARTRATE 5 MG PO TABS: 5.0000 mg | ORAL_TABLET | Freq: Every evening | ORAL | Status: DC | PRN

## 2011-11-29 MED ORDER — EPHEDRINE SULFATE 50 MG/ML IJ SOLN
INTRAMUSCULAR | Status: DC | PRN
  Administered 2011-11-29: 5 mg via INTRAVENOUS

## 2011-11-29 MED ORDER — ACETAMINOPHEN 10 MG/ML IV SOLN
INTRAVENOUS | Status: AC
  Filled 2011-11-29: qty 100

## 2011-11-29 MED ORDER — HYDROCODONE-ACETAMINOPHEN 5-325 MG PO TABS: 1.0000 | ORAL_TABLET | Freq: Four times a day (QID) | ORAL | Status: DC | PRN

## 2011-11-29 MED ORDER — HYDROMORPHONE HCL PF 1 MG/ML IJ SOLN
0.5000 mg | INTRAMUSCULAR | Status: DC | PRN
Start: 2011-11-29 — End: 2011-11-30

## 2011-11-29 MED ORDER — PROMETHAZINE HCL 25 MG/ML IJ SOLN: 6.2500 mg | INTRAMUSCULAR | Status: DC | PRN

## 2011-11-29 MED ORDER — HYOSCYAMINE SULFATE 0.125 MG SL SUBL
0.1250 mg | SUBLINGUAL_TABLET | SUBLINGUAL | Status: DC | PRN
  Administered 2011-11-30: 0.125 mg via ORAL
  Filled 2011-11-29 (×3): qty 1

## 2011-11-29 MED ORDER — BELLADONNA ALKALOIDS-OPIUM 16.2-60 MG RE SUPP
RECTAL | Status: AC
  Filled 2011-11-29: qty 1

## 2011-11-29 MED ORDER — PROPOFOL 10 MG/ML IV EMUL
INTRAVENOUS | Status: DC | PRN
  Administered 2011-11-29: 250 mg via INTRAVENOUS

## 2011-11-29 MED ORDER — CIPROFLOXACIN IN D5W 400 MG/200ML IV SOLN
400.0000 mg | INTRAVENOUS | Status: AC
  Administered 2011-11-29: 400 mg via INTRAVENOUS

## 2011-11-29 MED ORDER — ATORVASTATIN CALCIUM 10 MG PO TABS
10.0000 mg | ORAL_TABLET | Freq: Every day | ORAL | Status: DC
  Administered 2011-11-29: 10 mg via ORAL
  Filled 2011-11-29 (×2): qty 1

## 2011-11-29 MED ORDER — LISINOPRIL 10 MG PO TABS
10.0000 mg | ORAL_TABLET | Freq: Every evening | ORAL | Status: DC
  Filled 2011-11-29: qty 1

## 2011-11-29 MED ORDER — FENTANYL CITRATE 0.05 MG/ML IJ SOLN
INTRAMUSCULAR | Status: DC | PRN
  Administered 2011-11-29: 50 ug via INTRAVENOUS
  Administered 2011-11-29 (×3): 25 ug via INTRAVENOUS

## 2011-11-29 MED ORDER — ACETAMINOPHEN 325 MG PO TABS: 650.0000 mg | ORAL_TABLET | ORAL | Status: DC | PRN

## 2011-11-29 MED ORDER — ACETAMINOPHEN 10 MG/ML IV SOLN
INTRAVENOUS | Status: DC | PRN
  Administered 2011-11-29: 1000 mg via INTRAVENOUS

## 2011-11-29 MED ORDER — DEXTROSE 5 % IV SOLN
1.0000 g | Freq: Once | INTRAVENOUS | Status: AC
  Administered 2011-11-29: 1 g via INTRAVENOUS
  Filled 2011-11-29: qty 10

## 2011-11-29 MED ORDER — FLEET ENEMA 7-19 GM/118ML RE ENEM: 1.0000 | ENEMA | Freq: Once | RECTAL | Status: DC

## 2011-11-29 MED ORDER — DEXTROSE 5 % IV SOLN
1.0000 g | INTRAVENOUS | Status: DC
  Administered 2011-11-30: 1 g via INTRAVENOUS
  Filled 2011-11-29: qty 10

## 2011-11-29 MED ORDER — CIPROFLOXACIN IN D5W 400 MG/200ML IV SOLN
INTRAVENOUS | Status: AC
  Filled 2011-11-29: qty 200

## 2011-11-29 MED ORDER — CIPROFLOXACIN IN D5W 400 MG/200ML IV SOLN
400.0000 mg | Freq: Two times a day (BID) | INTRAVENOUS | Status: DC
  Administered 2011-11-29 – 2011-11-30 (×2): 400 mg via INTRAVENOUS
  Filled 2011-11-29 (×3): qty 200

## 2011-11-29 MED ORDER — HYDROMORPHONE HCL PF 1 MG/ML IJ SOLN: 0.2500 mg | INTRAMUSCULAR | Status: DC | PRN

## 2011-11-29 MED ORDER — BISACODYL 10 MG RE SUPP: 10.0000 mg | Freq: Every day | RECTAL | Status: DC | PRN

## 2011-11-29 MED ORDER — PANTOPRAZOLE SODIUM 40 MG PO TBEC
40.0000 mg | DELAYED_RELEASE_TABLET | Freq: Every day | ORAL | Status: DC
  Administered 2011-11-29 – 2011-11-30 (×2): 40 mg via ORAL
  Filled 2011-11-29 (×2): qty 1

## 2011-11-29 MED ORDER — KCL IN DEXTROSE-NACL 20-5-0.45 MEQ/L-%-% IV SOLN
INTRAVENOUS | Status: AC
  Filled 2011-11-29: qty 1000

## 2011-11-29 MED ORDER — LIDOCAINE HCL 1 % IJ SOLN
INTRAMUSCULAR | Status: DC | PRN
  Administered 2011-11-29: 60 mg via INTRADERMAL

## 2011-11-29 MED ORDER — DOCUSATE SODIUM 100 MG PO CAPS
100.0000 mg | ORAL_CAPSULE | Freq: Two times a day (BID) | ORAL | Status: DC
  Administered 2011-11-29 – 2011-11-30 (×2): 100 mg via ORAL
  Filled 2011-11-29 (×3): qty 1

## 2011-11-29 MED ORDER — DOCUSATE SODIUM 100 MG PO CAPS: 100.0000 mg | ORAL_CAPSULE | Freq: Two times a day (BID) | ORAL | Status: DC

## 2011-11-29 MED ORDER — METOPROLOL SUCCINATE ER 25 MG PO TB24
25.0000 mg | ORAL_TABLET | Freq: Every evening | ORAL | Status: DC
  Filled 2011-11-29: qty 1

## 2011-11-29 MED ORDER — SODIUM CHLORIDE 0.9 % IR SOLN
Status: DC | PRN
  Administered 2011-11-29 (×6): 3000 mL

## 2011-11-29 MED ORDER — ONDANSETRON HCL 4 MG/2ML IJ SOLN
INTRAMUSCULAR | Status: DC | PRN
  Administered 2011-11-29: 4 mg via INTRAVENOUS

## 2011-11-29 MED ORDER — HYDROCODONE-ACETAMINOPHEN 5-325 MG PO TABS: 1.0000 | ORAL_TABLET | ORAL | Status: DC | PRN

## 2011-11-29 MED ORDER — PHENYLEPHRINE HCL 10 MG/ML IJ SOLN
INTRAMUSCULAR | Status: DC | PRN
  Administered 2011-11-29: 40 ug via INTRAVENOUS

## 2011-11-29 MED ORDER — ONDANSETRON HCL 4 MG/2ML IJ SOLN: 4.0000 mg | INTRAMUSCULAR | Status: DC | PRN

## 2011-11-29 SURGICAL SUPPLY — 25 items
BAG URINE DRAINAGE (UROLOGICAL SUPPLIES) ×1 IMPLANT
BAG URO CATCHER STRL LF (DRAPE) ×2 IMPLANT
BLADE SURG 15 STRL LF DISP TIS (BLADE) IMPLANT
BLADE SURG 15 STRL SS (BLADE)
CATH FOLEY 3WAY 30CC 22FR (CATHETERS) ×1 IMPLANT
CLOTH BEACON ORANGE TIMEOUT ST (SAFETY) ×2 IMPLANT
DRAPE CAMERA CLOSED 9X96 (DRAPES) ×2 IMPLANT
ELECT BUTTON HF 24-28F 2 30DE (ELECTRODE) ×2 IMPLANT
ELECT HF RESECT BIPO 24F 45 ND (CUTTING LOOP) ×2 IMPLANT
ELECT LOOP MED HF 24F 12D (CUTTING LOOP) ×3 IMPLANT
ELECT REM PT RETURN 9FT ADLT (ELECTROSURGICAL) ×2
ELECT RESECT VAPORIZE 12D CBL (ELECTRODE) ×1 IMPLANT
ELECTRODE REM PT RTRN 9FT ADLT (ELECTROSURGICAL) ×1 IMPLANT
GLOVE SURG SS PI 8.0 STRL IVOR (GLOVE) ×2 IMPLANT
GOWN PREVENTION PLUS XLARGE (GOWN DISPOSABLE) ×2 IMPLANT
GOWN STRL REIN XL XLG (GOWN DISPOSABLE) ×2 IMPLANT
HOLDER FOLEY CATH W/STRAP (MISCELLANEOUS) IMPLANT
IV NS IRRIG 3000ML ARTHROMATIC (IV SOLUTION) ×8 IMPLANT
KIT ASPIRATION TUBING (SET/KITS/TRAYS/PACK) ×2 IMPLANT
MANIFOLD NEPTUNE II (INSTRUMENTS) ×2 IMPLANT
PACK CYSTO (CUSTOM PROCEDURE TRAY) ×2 IMPLANT
SUT ETHILON 3 0 PS 1 (SUTURE) IMPLANT
SYR 30ML LL (SYRINGE) IMPLANT
SYRINGE IRR TOOMEY STRL 70CC (SYRINGE) IMPLANT
TUBING CONNECTING 10 (TUBING) ×2 IMPLANT

## 2011-11-29 NOTE — Brief Op Note (Signed)
11/29/2011  11:20 AM  PATIENT:  [name]  62 y.o. male  PRE-OPERATIVE DIAGNOSIS:  bph with retention elevated psa and a prostate nodule with nodule  POST-OPERATIVE DIAGNOSIS:  BPH with retention and elevated PSA and a prostate nodule  PROCEDURE:  Procedure(s) (LRB) with comments: TRANSURETHRAL RESECTION OF THE PROSTATE WITH GYRUS INSTRUMENTS (N/A) - Prostate Ultrasound, and Biopsy  SURGEON:  Surgeon(s) and Role:    * Anner Crete, MD - Primary  PHYSICIAN ASSISTANT:   ASSISTANTS: none   ANESTHESIA:   general  EBL:     BLOOD ADMINISTERED:none  DRAINS: Urinary Catheter (Foley)   LOCAL MEDICATIONS USED:  NONE  SPECIMEN:  Source of Specimen:  12 core prostate biopsy and tur chips.   DISPOSITION OF SPECIMEN:  PATHOLOGY  COUNTS:  YES  TOURNIQUET:  * No tourniquets in log *  DICTATION: .Other Dictation: Dictation Number [id number]  PLAN OF CARE: Admit for overnight observation  PATIENT DISPOSITION:  PACU - hemodynamically stable.   Delay start of Pharmacological VTE agent (>24hrs) due to surgical blood loss or risk of bleeding: yes

## 2011-11-29 NOTE — Transfer of Care (Signed)
Immediate Anesthesia Transfer of Care Note  Patient: [name]  Procedure(s) Performed: Procedure(s) (LRB) with comments: TRANSURETHRAL RESECTION OF THE PROSTATE WITH GYRUS INSTRUMENTS (N/A) - Prostate Ultrasound, and Biopsy  Patient Location: PACU  Anesthesia Type: General  Level of Consciousness: sedated and patient cooperative  Airway & Oxygen Therapy: Patient Spontanous Breathing and Patient connected to face mask oxygen  Post-op Assessment: Report given to PACU RN and Post -op Vital signs reviewed and stable  Post vital signs: Reviewed and stable  Complications: No apparent anesthesia complications

## 2011-11-29 NOTE — Interval H&P Note (Signed)
History and Physical Interval Note:  11/29/2011 9:52 AM  [name]  has presented today for surgery, with the diagnosis of bph with retention elevated psa with nodule  The various methods of treatment have been discussed with the patient and family. After consideration of risks, benefits and other options for treatment, the patient has consented to  Procedure(s) (LRB) with comments: TRANSURETHRAL RESECTION OF THE PROSTATE WITH GYRUS INSTRUMENTS (N/A) - Prostate Ultrasound, and Biopsy as a surgical intervention .  [name] history has been reviewed, patient examined, no change in status, stable for surgery.  I have reviewed the patient's chart and labs.  Questions were answered to the patient's satisfaction.     [name]

## 2011-11-29 NOTE — Progress Notes (Signed)
Patient ID: [name], male   DOB: 09-08-1950, 61 y.o.   MRN: [id number]  He is doing well without complaints and his urine is very clear on CBI.   I will get his foley out in the morning and send him home when voiding.

## 2011-11-29 NOTE — Anesthesia Preprocedure Evaluation (Addendum)
Anesthesia Evaluation  Patient identified by MRN, date of birth, ID band Patient awake    Reviewed: Allergy & Precautions, H&P , NPO status , Patient's Chart, lab work & pertinent test results  Airway Mallampati: II TM Distance: >3 FB Neck ROM: Full    Dental  (+) Loose, Dental Advisory Given and Poor Dentition   Pulmonary shortness of breath, COPDCurrent Smoker,  breath sounds clear to auscultation  Pulmonary exam normal       Cardiovascular hypertension, Pt. on home beta blockers and Pt. on medications + CAD Rhythm:Regular Rate:Normal     Neuro/Psych PSYCHIATRIC DISORDERS negative neurological ROS     GI/Hepatic negative GI ROS, Neg liver ROS,   Endo/Other  negative endocrine ROS  Renal/GU negative Renal ROS  negative genitourinary   Musculoskeletal negative musculoskeletal ROS (+)   Abdominal   Peds negative pediatric ROS (+)  Hematology negative hematology ROS (+)   Anesthesia Other Findings   Reproductive/Obstetrics negative OB ROS                          Anesthesia Physical Anesthesia Plan  ASA: III  Anesthesia Plan: General   Post-op Pain Management:    Induction: Intravenous  Airway Management Planned: LMA  Additional Equipment:   Intra-op Plan:   Post-operative Plan: Extubation in OR  Informed Consent: I have reviewed the patients History and Physical, chart, labs and discussed the procedure including the risks, benefits and alternatives for the proposed anesthesia with the patient or authorized representative who has indicated his/her understanding and acceptance.   Dental advisory given  Plan Discussed with: CRNA  Anesthesia Plan Comments:         Anesthesia Quick Evaluation

## 2011-11-29 NOTE — Preoperative (Signed)
Beta Blockers   Reason not to administer Beta Blockers:Not Applicable 

## 2011-11-29 NOTE — Anesthesia Postprocedure Evaluation (Signed)
  Anesthesia Post-op Note  Patient: [name]  Procedure(s) Performed: Procedure(s) (LRB): TRANSURETHRAL RESECTION OF THE PROSTATE WITH GYRUS INSTRUMENTS (N/A)  Patient Location: PACU  Anesthesia Type: General  Level of Consciousness: awake and alert   Airway and Oxygen Therapy: Patient Spontanous Breathing  Post-op Pain: mild  Post-op Assessment: Post-op Vital signs reviewed, Patient's Cardiovascular Status Stable, Respiratory Function Stable, Patent Airway and No signs of Nausea or vomiting  Post-op Vital Signs: stable  Complications: No apparent anesthesia complications

## 2011-11-30 ENCOUNTER — Encounter (HOSPITAL_COMMUNITY): Payer: Self-pay | Admitting: Urology

## 2011-11-30 MED ORDER — BACITRACIN-NEOMYCIN-POLYMYXIN 400-5-5000 EX OINT
1.0000 "application " | TOPICAL_OINTMENT | Freq: Three times a day (TID) | CUTANEOUS | Status: DC | PRN
  Administered 2011-11-30: 1 via TOPICAL

## 2011-11-30 NOTE — Discharge Summary (Signed)
Patient ID: [name] MRN: [id number] DOB/AGE: Apr 17, 1950 61 y.o.  Admit date: 11/29/2011 Discharge date: 11/30/2011  Primary Care Physician:  Oliver Barre, MD  Discharge Diagnoses:   1. Bladder neck obstruction  2. Elevated PSA Consults:  None     Discharge Medications:   Medication List     As of 11/30/2011  7:34 AM    STOP taking these medications         finasteride 5 MG tablet   Commonly known as: PROSCAR      Tamsulosin HCl 0.4 MG Caps   Commonly known as: FLOMAX      TAKE these medications         aspirin EC 81 MG tablet   Take 81 mg by mouth every evening.      ciprofloxacin 500 MG tablet   Commonly known as: CIPRO   Take 500 mg by mouth 2 (two) times daily.      docusate sodium 100 MG capsule   Commonly known as: COLACE   Take 1 capsule (100 mg total) by mouth 2 (two) times daily.      HYDROcodone-acetaminophen 5-325 MG per tablet   Commonly known as: NORCO/VICODIN   Take 1 tablet by mouth every 6 (six) hours as needed for pain.      lisinopril 10 MG tablet   Commonly known as: PRINIVIL,ZESTRIL   Take 10 mg by mouth every evening.      metoprolol succinate 25 MG 24 hr tablet   Commonly known as: TOPROL-XL   Take 25 mg by mouth every evening.      omeprazole 10 MG capsule   Commonly known as: PRILOSEC   Take 10 mg by mouth daily.      rosuvastatin 10 MG tablet   Commonly known as: CRESTOR   Take 10 mg by mouth every evening.         Significant Diagnostic Studies:  No results found.  Brief H and P: For complete details please refer to admission H and P, but in brief the patient is admitted for further management of elevated PSA and BPH.[name]  Hospital Course:  Patient was admitted for TURP and prostate biopsy. He did well following the procedure, he voided adequately following catheter removal and was discharged on postoperative day #1. Home  Day of Discharge BP 170/104  Pulse 45  Temp 97.6 F (36.4 C) (Axillary)  Resp 18  Ht 6'  4" (1.93 m)  Wt 64.41 kg (142 lb)  BMI 17.28 kg/m2  SpO2 100%  No results found for this or any previous visit (from the past 24 hour(s)).  Physical Exam: General: Alert and awake oriented x3 not in any acute distress. HEENT: anicteric sclera, pupils reactive to light and accommodation CVS: S1-S2 clear no murmur rubs or gallops Chest: clear to auscultation bilaterally, no wheezing rales or rhonchi Abdomen: soft nontender, nondistended, normal bowel sounds, no organomegaly Extremities: no cyanosis, clubbing or edema noted bilaterally Neuro: Cranial nerves II-XII intact, no focal neurological deficits  Disposition: Home Diet: No restrictions Activity: Gradually increase  Disposition and Follow-up:     Discharge Orders    Future Appointments: Provider: Department: Dept Phone: Center:   12/21/2011 1:00 PM [facility] [phone] [facility]   12/21/2011 2:00 PM Fransisco Hertz, MD [facility] [phone] [facility]     Future Orders Please Complete By Expires   Discharge patient         TESTS THAT NEED FOLLOW-UP Followup of pathology  DISCHARGE FOLLOW-UP  Follow-up Information    Follow up with Anner Crete, MD. On 12/12/2011. [phone])    Contact information:   [address] [place] [address] [phone]          Time spent on Discharge: 10 minutes Signed: Chelsea Aus 11/30/2011, 7:34 AM

## 2011-11-30 NOTE — Care Management Note (Signed)
    Page 1 of 1   11/30/2011     10:51:30 AM   CARE MANAGEMENT NOTE 11/30/2011  Patient:  [name], [name]   Account Number:  [id number]  Date Initiated:  11/30/2011  Documentation initiated by:  Lanier Clam  Subjective/Objective Assessment:   ADMITTED W/BLADDER NECK OBSTRUCTION     Action/Plan:   FROM HOME   Anticipated DC Date:  11/30/2011   Anticipated DC Plan:  HOME/SELF CARE      DC Planning Services  CM consult      Choice offered to / List presented to:             Status of service:  Completed, signed off Medicare Important Message given?   (If response is "NO", the following Medicare IM given date fields will be blank) Date Medicare IM given:   Date Additional Medicare IM given:    Discharge Disposition:  HOME/SELF CARE  Per UR Regulation:    If discussed at Long Length of Stay Meetings, dates discussed:    Comments:  11/20/11 Onur Mori RN,BSN NCM [phone]

## 2011-11-30 NOTE — Op Note (Signed)
[name], [name]               ACCOUNT NO.:  [id number]  MEDICAL RECORD NO.:  [id number]  LOCATION:  1408                         FACILITY:  [facility]  PHYSICIAN:  Excell Seltzer. Annabell Howells, M.D.    DATE OF BIRTH:  Jul 05, 1950  DATE OF PROCEDURE:  11/29/2011 DATE OF DISCHARGE:                              OPERATIVE REPORT   PROCEDURES: 1. Transrectal ultrasound of the prostate with ultrasound-guided     prostate biopsy. 2. Transurethral resection of the prostate.  PREOPERATIVE DIAGNOSIS:  Benign prostatic hypertrophy with urinary retention and nodular prostate with elevated PSA.  POSTOPERATIVE DIAGNOSIS:  Benign prostatic hypertrophy with urinary retention and nodular prostate with elevated PSA.  SURGEON:  Excell Seltzer. Annabell Howells, MD  ANESTHESIA:  General.  SPECIMEN:  Twelve core prostate biopsy and TUR prostate chips.  BLOOD LOSS:  Approximately 500 mL.  DRAINS:  22-French 3-way Foley catheter.  COMPLICATIONS:  None.  INDICATIONS:  [name] is a 61 year old African American male who has urinary retention and prostate nodule with an elevated PSA.  He is to undergo prostate ultrasound and biopsy followed by transurethral resection of the prostate.  FINDINGS OF PROCEDURE:  He was given Cipro and Rocephin and taken to the operating room where general anesthetic was induced.  He was fitted with PAS hose and placed in lithotomy position.  His Foley catheter was removed.  He then underwent a transrectal ultrasound of the prostate with a 10 MHz probe in low lithotomy position.  Inspection of the prostate revealed normal-appearing seminal vesicles.  There was a hypoechoic area involving the peripheral zone at the right base laterally.  This measured approximately 1.5 cm in diameter and was most densely hypoechoic at the junction with the seminal vesicles.  The remainder of the peripheral zone was unremarkable.  The transitional zone was enlarged with a variegated echotexture in central  calcifications, but no other lesions were identified.  The prostatic urethral length is 6.16 cm, the height is 3.49 cm and the width is 5.61 cm.  This prostate volume was 63.15 mL.  After completion of the diagnostic scan, he underwent a 12 core pattern prostate biopsy with care taken to target the hypoechoic area in the right base.  Once the biopsy was complete, the probe was removed.  His genitalia and perineum were then prepped with Betadine solution.  He was draped in usual sterile fashion.  I then inserted a 28-French continuous flow resectoscope sheath with an Wandra Scot handle with a 12-degrees lens and a 24 gyrus loop.  Saline was used for the irrigant.  Inspection revealed a normal urethra.  The external sphincter was intact.  The prostatic urethra was elongated with trilobar hyperplasia with obstruction.  Examination of bladder revealed moderate trabeculation.  There was edema on the dome consistent with his prolonged catheter drainage and some erythema of the residual mucosa, but nothing suspicious for neoplasm.  The ureteral orifices were unremarkable and away from the bladder neck.  At this point, resection of prostate was initiated by the finding of the bladder neck fibers from 5-7 o'clock.  The floor of the prostate was then resected out to alongside the verumontanum.  At this point, the right lobe of  the prostate was resected from bladder neck to apex out to the capsular fibers.  This was followed by the left lobe in an identical fashion.  Residual apical and anterior tissue were then resected.  At this point, the bladder was evacuated free of chips and a button electrode was then installed on the resectoscope handle to aid in postresection hemostasis.  Once hemostasis had been assured, the bladder was evacuated free of chips.  Inspection revealed intact ureteral orifices and no retained prostate tissue.  Inspection of the fossa revealed no active bleeding.  The  bladder was left moderately full and the scope was removed.  Pressure on the bladder produced an excellent stream.  A 22-French 3-way Foley catheter was in place with the aid of a catheter guide.  The balloon was filled with 30 mL of sterile fluid. The catheter was hand irrigated with clear return and placed to continuous irrigation and straight drainage.  The patient was taken down from lithotomy position.  His anesthetic was reversed.  He was moved to the recovery room in stable condition.  There were no complications.     Excell Seltzer. Annabell Howells, M.D.     JJW/MEDQ  D:  11/29/2011  T:  11/30/2011  Job:  [id number]

## 2011-12-07 ENCOUNTER — Ambulatory Visit: Payer: Medicare Other | Admitting: Vascular Surgery

## 2011-12-07 ENCOUNTER — Other Ambulatory Visit: Payer: Medicare Other

## 2011-12-12 DIAGNOSIS — R339 Retention of urine, unspecified: Secondary | ICD-10-CM | POA: Diagnosis not present

## 2011-12-12 DIAGNOSIS — N3941 Urge incontinence: Secondary | ICD-10-CM | POA: Diagnosis not present

## 2011-12-12 DIAGNOSIS — R82998 Other abnormal findings in urine: Secondary | ICD-10-CM | POA: Diagnosis not present

## 2011-12-21 ENCOUNTER — Other Ambulatory Visit: Payer: Medicare Other

## 2011-12-21 ENCOUNTER — Ambulatory Visit: Payer: Medicare Other | Admitting: Vascular Surgery

## 2012-01-03 ENCOUNTER — Encounter: Payer: Self-pay | Admitting: Vascular Surgery

## 2012-01-04 ENCOUNTER — Ambulatory Visit: Payer: Medicare Other | Admitting: Vascular Surgery

## 2012-01-04 ENCOUNTER — Other Ambulatory Visit: Payer: Medicare Other

## 2012-07-03 ENCOUNTER — Telehealth: Payer: Self-pay

## 2012-07-03 MED ORDER — ROSUVASTATIN CALCIUM 10 MG PO TABS: 10.0000 mg | ORAL_TABLET | Freq: Every evening | ORAL | Status: DC

## 2012-07-03 MED ORDER — METOPROLOL SUCCINATE ER 25 MG PO TB24: 25.0000 mg | ORAL_TABLET | Freq: Every evening | ORAL | Status: DC

## 2012-07-03 MED ORDER — LISINOPRIL 10 MG PO TABS: 10.0000 mg | ORAL_TABLET | Freq: Every evening | ORAL | Status: DC

## 2012-07-03 MED ORDER — OMEPRAZOLE 10 MG PO CPDR: 10.0000 mg | DELAYED_RELEASE_CAPSULE | Freq: Every day | ORAL | Status: DC

## 2012-07-03 NOTE — Telephone Encounter (Signed)
Refilled medications #30 day informed pharmacy needs to schedule office visit for further refills.

## 2012-09-19 ENCOUNTER — Other Ambulatory Visit: Payer: Self-pay | Admitting: Internal Medicine

## 2012-10-10 ENCOUNTER — Encounter: Payer: Medicare Other | Admitting: Internal Medicine

## 2012-10-14 ENCOUNTER — Ambulatory Visit (INDEPENDENT_AMBULATORY_CARE_PROVIDER_SITE_OTHER): Payer: Medicare Other | Admitting: Internal Medicine

## 2012-10-14 ENCOUNTER — Encounter: Payer: Self-pay | Admitting: Internal Medicine

## 2012-10-14 ENCOUNTER — Ambulatory Visit (INDEPENDENT_AMBULATORY_CARE_PROVIDER_SITE_OTHER): Payer: Medicare Other

## 2012-10-14 VITALS — BP 162/100 | HR 61 | Temp 97.3°F | Ht 75.0 in | Wt 139.0 lb

## 2012-10-14 DIAGNOSIS — I10 Essential (primary) hypertension: Secondary | ICD-10-CM | POA: Diagnosis not present

## 2012-10-14 DIAGNOSIS — Z0000 Encounter for general adult medical examination without abnormal findings: Secondary | ICD-10-CM | POA: Diagnosis not present

## 2012-10-14 DIAGNOSIS — E785 Hyperlipidemia, unspecified: Secondary | ICD-10-CM | POA: Diagnosis not present

## 2012-10-14 DIAGNOSIS — R972 Elevated prostate specific antigen [PSA]: Secondary | ICD-10-CM

## 2012-10-14 LAB — CBC WITH DIFFERENTIAL/PLATELET
Eosinophils Absolute: 0.3 10*3/uL (ref 0.0–0.7)
Eosinophils Relative: 4 % (ref 0.0–5.0)
HCT: 42.2 % (ref 39.0–52.0)
Lymphs Abs: 1.8 10*3/uL (ref 0.7–4.0)
MCHC: 32.7 g/dL (ref 30.0–36.0)
MCV: 93.8 fl (ref 78.0–100.0)
Monocytes Absolute: 0.5 10*3/uL (ref 0.1–1.0)
Neutrophils Relative %: 60.1 % (ref 43.0–77.0)
Platelets: 210 10*3/uL (ref 150.0–400.0)
RDW: 14.9 % — ABNORMAL HIGH (ref 11.5–14.6)

## 2012-10-14 LAB — URINALYSIS, ROUTINE W REFLEX MICROSCOPIC
Bilirubin Urine: NEGATIVE
Hgb urine dipstick: NEGATIVE
Ketones, ur: NEGATIVE
Leukocytes, UA: NEGATIVE
pH: 6.5 (ref 5.0–8.0)

## 2012-10-14 MED ORDER — METOPROLOL SUCCINATE ER 25 MG PO TB24: 25.0000 mg | ORAL_TABLET | Freq: Every evening | ORAL | Status: DC

## 2012-10-14 MED ORDER — ROSUVASTATIN CALCIUM 10 MG PO TABS: 10.0000 mg | ORAL_TABLET | Freq: Every evening | ORAL | Status: DC

## 2012-10-14 MED ORDER — LOSARTAN POTASSIUM 50 MG PO TABS: 50.0000 mg | ORAL_TABLET | Freq: Every day | ORAL | Status: DC

## 2012-10-14 MED ORDER — OMEPRAZOLE 10 MG PO CPDR: 10.0000 mg | DELAYED_RELEASE_CAPSULE | Freq: Every day | ORAL | Status: DC

## 2012-10-14 NOTE — Assessment & Plan Note (Addendum)
stable overall by history and exam, recent data reviewed with pt, and pt to change ACE to losartan 50 qd,  to f/u any worsening symptoms or concerns BP Readings from Last 3 Encounters:  10/14/12 162/100  11/30/11 170/104  11/30/11 170/104   Note:  Total time for pt hx, exam, review of record with pt in the room, determination of diagnoses and plan for further eval and tx is > 40 min, with over 50% spent in coordination and counseling of patient

## 2012-10-14 NOTE — Assessment & Plan Note (Signed)
Not charged but due for colonoscopy - will try to arrange

## 2012-10-14 NOTE — Progress Notes (Signed)
Subjective:    Patient ID: [name], male    DOB: October 26, 1950, 62 y.o.   MRN: [id number]  HPI  Here with wife who remains supportive, pt with chronic mental illness;  overall doing ok,  Pt denies chest pain, increased sob or doe, wheezing, orthopnea, PND, increased LE swelling, palpitations, dizziness or syncope.  Pt denies polydipsia, polyuria, or low sugar symptoms such as weakness or confusion improved with po intake.  Pt denies new neurological symptoms such as new headache, or facial or extremity weakness or numbness.   Pt states overall good compliance with meds, has been trying to follow lower cholesterol diet, with wt overall stable,  but little exercise however. Has itching of the arms, ? Worse with taking the ACE, may have dry skin but wife asks if med can be changed. No rash or swelling. Due for colonoscopy Past Medical History  Diagnosis Date  . Hypertension   . Hyperlipidemia   . Hyperplastic colon polyp 04/21/2011  . PVD (peripheral vascular disease) 04/21/2011  . Hx of CABG     May, 2010  . Ejection fraction     EF 35-40%, echo, May, 2010  . Abdominal aortic aneurysm     AAA And bilateral common iliac artery aneurysms  . Tobacco abuse   . Pulmonary nodule     Chest CT May, 2013, 2 small pulmonary nodules, needs appropriate followup,  this CT was not ordered by the cardiology team  . Preop cardiovascular exam     Cardiac clearance for major vascular surgery, May, 2013  . CAD (coronary artery disease)     Dr Myrtis Ser @ [place]   . AAA (abdominal aortic aneurysm)   . COPD (chronic obstructive pulmonary disease) 04/21/2011    smoking  . Shortness of breath   . BPH (benign prostatic hyperplasia)   . Retention of urine   . Foley catheter in place   . Chronic mental illness 04/21/2011    Diagnosis unclear,  Family provides good care   Past Surgical History  Procedure Laterality Date  . Coronary artery bypass graft  2008  . Abdominal aorta stent  07/2011  . Abdominal aortic  aneurysm repair  07/2011  . Transurethral resection of prostate  11/29/2011    Procedure: TRANSURETHRAL RESECTION OF THE PROSTATE WITH GYRUS INSTRUMENTS;  Surgeon: Anner Crete, MD;  Location: [facility];  Service: Urology;  Laterality: N/A;  Prostate Ultrasound, and Biopsy    reports that he has been smoking Cigarettes.  He has been smoking about 1.00 pack per day. He has never used smokeless tobacco. He reports that he does not drink alcohol or use illicit drugs. family history includes Arthritis in his sister; Cancer in his father; Diabetes in his father; Heart attack (age of onset: 5) in his father; Heart disease in his father; Hyperlipidemia in his sister; and Hypertension in his sister.  There is no history of Anesthesia problems. Allergies  Allergen Reactions  . Ace Inhibitors Itching   Current Outpatient Prescriptions on File Prior to Visit  Medication Sig Dispense Refill  . aspirin EC 81 MG tablet Take 81 mg by mouth every evening.      . docusate sodium (COLACE) 100 MG capsule Take 1 capsule (100 mg total) by mouth 2 (two) times daily.  60 capsule  2  . HYDROcodone-acetaminophen (NORCO) 5-325 MG per tablet Take 1 tablet by mouth every 6 (six) hours as needed for pain.  15 tablet  0   No current facility-administered medications  on file prior to visit.   Review of Systems  Constitutional: Negative for unexpected weight change, or unusual diaphoresis  HENT: Negative for tinnitus.   Eyes: Negative for photophobia and visual disturbance.  Respiratory: Negative for choking and stridor.   Gastrointestinal: Negative for vomiting and blood in stool.  Genitourinary: Negative for hematuria and decreased urine volume.  Musculoskeletal: Negative for acute joint swelling Skin: Negative for color change and wound.  Neurological: Negative for tremors and numbness other than noted  Psychiatric/Behavioral: Negative for decreased concentration or  hyperactivity.       Objective:   Physical  Exam BP 162/100  Pulse 61  Temp(Src) 97.3 F (36.3 C) (Oral)  Ht 6\' 3"  (1.905 m)  Wt 139 lb (63.05 kg)  BMI 17.37 kg/m2  SpO2 93% VS noted, not ill apperaring Constitutional: Pt appears well-developed and well-nourished.  HENT: Head: NCAT.  Right Ear: External ear normal.  Left Ear: External ear normal.  Eyes: Conjunctivae and EOM are normal. Pupils are equal, round, and reactive to light.  Neck: Normal range of motion. Neck supple.  Cardiovascular: Normal rate and regular rhythm.   Pulmonary/Chest: Effort normal and breath sounds normal.  Abd:  Soft, NT, non-distended, + BS Neurological: Pt is alert.  Skin: Skin is warm. No erythema.  Psychiatric: Pt behavior is normal, though quiet , mild nervous, pressure speech with speaking    Assessment & Plan:

## 2012-10-14 NOTE — Assessment & Plan Note (Signed)
For f/u psa today

## 2012-10-14 NOTE — Patient Instructions (Signed)
OK to stop the lisinopril Please take all new medication as prescribed - the losartan at 50 mg per day Please continue all other medications as before, and refills have been done as requested. Please have the pharmacy call with any other refills you may need in the future Please go to the LAB in the Basement (turn left off the elevator) for the tests to be done today You will be contacted by phone if any changes need to be made immediately.  Otherwise, you will receive a letter about your results with an explanation, but please check with MyChart first. Please continue your efforts at being more active, low cholesterol diet, and weight control. You will be contacted regarding the referral for: colonoscopy You are otherwise up to date with prevention measures today.  Please remember to sign up for My Chart if you have not done so, as this will be important to you in the future with finding out test results, communicating by private email, and scheduling acute appointments online when needed.  Please return in 6 months, or sooner if needed

## 2012-10-14 NOTE — Assessment & Plan Note (Signed)
stable overall by history and exam, recent data reviewed with pt, and pt to continue medical treatment as before,  to f/u any worsening symptoms or concerns Lab Results  Component Value Date   [facility]  Value: 125        Total Cholesterol/HDL:CHD Risk Coronary Heart Disease Risk Table                     Men   Women  1/2 Average Risk   3.4   3.3  Average Risk       5.0   4.4  2 X Average Risk   9.6   7.1  3 X Average Risk  23.4   11.0        Use the calculated Patient Ratio above and the CHD Risk Table to determine the patient's CHD Risk.        ATP III CLASSIFICATION (LDL):  <100     mg/dL   Optimal  119-147  mg/dL   Near or Above                    Optimal  130-159  mg/dL   Borderline  829-562  mg/dL   High  >130     mg/dL   Very High* 8/65/7846   For f/u lab

## 2012-10-15 ENCOUNTER — Encounter: Payer: Self-pay | Admitting: Internal Medicine

## 2012-10-15 ENCOUNTER — Other Ambulatory Visit: Payer: Self-pay | Admitting: Internal Medicine

## 2012-10-15 LAB — HEPATIC FUNCTION PANEL
ALT: 18 U/L (ref 0–53)
AST: 25 U/L (ref 0–37)
Alkaline Phosphatase: 58 U/L (ref 39–117)
Bilirubin, Direct: 0.1 mg/dL (ref 0.0–0.3)
Total Bilirubin: 0.5 mg/dL (ref 0.3–1.2)

## 2012-10-15 LAB — LIPID PANEL
HDL: 70.2 mg/dL (ref >39.00)
VLDL: 33.2 mg/dL (ref 0.0–40.0)

## 2012-10-15 LAB — LDL CHOLESTEROL, DIRECT: Direct LDL: 145.6 mg/dL

## 2012-10-15 LAB — BASIC METABOLIC PANEL
BUN: 17 mg/dL (ref 6–23)
Chloride: 105 mEq/L (ref 96–112)
GFR: 91.32 mL/min (ref >60.00)
Potassium: 4.6 mEq/L (ref 3.5–5.1)
Sodium: 139 mEq/L (ref 135–145)

## 2012-10-15 LAB — TSH: TSH: 0.72 u[IU]/mL (ref 0.35–5.50)

## 2012-10-15 MED ORDER — ROSUVASTATIN CALCIUM 20 MG PO TABS: 20.0000 mg | ORAL_TABLET | Freq: Every day | ORAL | Status: DC

## 2013-04-15 ENCOUNTER — Ambulatory Visit: Payer: Medicare Other | Admitting: Internal Medicine

## 2013-04-29 ENCOUNTER — Telehealth: Payer: Self-pay | Admitting: *Deleted

## 2013-04-29 ENCOUNTER — Ambulatory Visit: Payer: Medicare Other | Admitting: Internal Medicine

## 2013-04-29 NOTE — Telephone Encounter (Signed)
Caller: [place]: not collected Patient: [name], [name] DOB: 05-14-1950 Phone: [phone] Reason for Call: See info below Regarding Appointment: Yes Appt Date: 04/29/2013 Appt Time: 2:45:00 PM Provider: Scarlette Calico (Adults only) Reason: Cancel Appointment Details: Patient not able to come due to weather/road conditions. ac/can Outcome: Cancelled appointment in EPIC [facility])

## 2013-12-29 ENCOUNTER — Other Ambulatory Visit: Payer: Self-pay | Admitting: Internal Medicine

## 2014-02-02 ENCOUNTER — Other Ambulatory Visit: Payer: Self-pay | Admitting: Internal Medicine

## 2014-02-18 ENCOUNTER — Encounter (HOSPITAL_COMMUNITY): Payer: Self-pay | Admitting: Vascular Surgery

## 2014-04-08 ENCOUNTER — Other Ambulatory Visit: Payer: Self-pay | Admitting: Internal Medicine

## 2014-05-28 ENCOUNTER — Other Ambulatory Visit (INDEPENDENT_AMBULATORY_CARE_PROVIDER_SITE_OTHER): Payer: Medicare Other

## 2014-05-28 ENCOUNTER — Other Ambulatory Visit: Payer: Self-pay | Admitting: Internal Medicine

## 2014-05-28 ENCOUNTER — Ambulatory Visit (INDEPENDENT_AMBULATORY_CARE_PROVIDER_SITE_OTHER): Payer: Medicare Other | Admitting: Internal Medicine

## 2014-05-28 ENCOUNTER — Encounter: Payer: Self-pay | Admitting: Internal Medicine

## 2014-05-28 VITALS — BP 126/86 | HR 59 | Temp 97.9°F | Resp 18 | Ht 75.0 in | Wt 142.0 lb

## 2014-05-28 DIAGNOSIS — I10 Essential (primary) hypertension: Secondary | ICD-10-CM | POA: Diagnosis not present

## 2014-05-28 DIAGNOSIS — R972 Elevated prostate specific antigen [PSA]: Secondary | ICD-10-CM

## 2014-05-28 DIAGNOSIS — E785 Hyperlipidemia, unspecified: Secondary | ICD-10-CM

## 2014-05-28 DIAGNOSIS — Z720 Tobacco use: Secondary | ICD-10-CM

## 2014-05-28 DIAGNOSIS — Z23 Encounter for immunization: Secondary | ICD-10-CM | POA: Diagnosis not present

## 2014-05-28 DIAGNOSIS — F17200 Nicotine dependence, unspecified, uncomplicated: Secondary | ICD-10-CM

## 2014-05-28 LAB — HEPATIC FUNCTION PANEL
ALBUMIN: 4.2 g/dL (ref 3.5–5.2)
ALT: 18 U/L (ref 0–53)
AST: 23 U/L (ref 0–37)
Alkaline Phosphatase: 78 U/L (ref 39–117)
BILIRUBIN DIRECT: 0.1 mg/dL (ref 0.0–0.3)
TOTAL PROTEIN: 7.2 g/dL (ref 6.0–8.3)
Total Bilirubin: 0.4 mg/dL (ref 0.2–1.2)

## 2014-05-28 LAB — CBC WITH DIFFERENTIAL/PLATELET
BASOS PCT: 0.8 % (ref 0.0–3.0)
Basophils Absolute: 0 10*3/uL (ref 0.0–0.1)
EOS ABS: 0.2 10*3/uL (ref 0.0–0.7)
EOS PCT: 3.9 % (ref 0.0–5.0)
HCT: 41.4 % (ref 39.0–52.0)
Hemoglobin: 13.9 g/dL (ref 13.0–17.0)
Lymphocytes Relative: 29.1 % (ref 12.0–46.0)
Lymphs Abs: 1.6 10*3/uL (ref 0.7–4.0)
MCHC: 33.5 g/dL (ref 30.0–36.0)
MCV: 92.3 fl (ref 78.0–100.0)
Monocytes Absolute: 0.4 10*3/uL (ref 0.1–1.0)
Monocytes Relative: 7.4 % (ref 3.0–12.0)
NEUTROS PCT: 58.8 % (ref 43.0–77.0)
Neutro Abs: 3.2 10*3/uL (ref 1.4–7.7)
PLATELETS: 219 10*3/uL (ref 150.0–400.0)
RBC: 4.48 Mil/uL (ref 4.22–5.81)
RDW: 14.6 % (ref 11.5–15.5)
WBC: 5.4 10*3/uL (ref 4.0–10.5)

## 2014-05-28 LAB — URINALYSIS, ROUTINE W REFLEX MICROSCOPIC
Bilirubin Urine: NEGATIVE
Hgb urine dipstick: NEGATIVE
KETONES UR: NEGATIVE
Leukocytes, UA: NEGATIVE
Nitrite: NEGATIVE
PH: 6 (ref 5.0–8.0)
SPECIFIC GRAVITY, URINE: 1.025 (ref 1.000–1.030)
Total Protein, Urine: NEGATIVE
URINE GLUCOSE: NEGATIVE
UROBILINOGEN UA: 0.2 (ref 0.0–1.0)
WBC UA: NONE SEEN — AB (ref 0–?)

## 2014-05-28 LAB — LIPID PANEL
Cholesterol: 243 mg/dL — ABNORMAL HIGH (ref 0–200)
HDL: 69.4 mg/dL (ref >39.00)
LDL CALC: 156 mg/dL — AB (ref 0–99)
NonHDL: 173.6
TRIGLYCERIDES: 88 mg/dL (ref 0.0–149.0)
Total CHOL/HDL Ratio: 4
VLDL: 17.6 mg/dL (ref 0.0–40.0)

## 2014-05-28 LAB — PSA: PSA: 1.58 ng/mL (ref 0.10–4.00)

## 2014-05-28 LAB — BASIC METABOLIC PANEL
BUN: 24 mg/dL — AB (ref 6–23)
CALCIUM: 9.6 mg/dL (ref 8.4–10.5)
CO2: 30 meq/L (ref 19–32)
CREATININE: 1.02 mg/dL (ref 0.40–1.50)
Chloride: 105 mEq/L (ref 96–112)
GFR: 93.95 mL/min (ref >60.00)
GLUCOSE: 84 mg/dL (ref 70–99)
Potassium: 4.1 mEq/L (ref 3.5–5.1)
Sodium: 139 mEq/L (ref 135–145)

## 2014-05-28 LAB — TSH: TSH: 0.61 u[IU]/mL (ref 0.35–4.50)

## 2014-05-28 MED ORDER — ROSUVASTATIN CALCIUM 40 MG PO TABS: 40.0000 mg | ORAL_TABLET | Freq: Every day | ORAL | Status: DC

## 2014-05-28 NOTE — Assessment & Plan Note (Signed)
stable overall by history and exam, recent data reviewed with pt, and pt to continue medical treatment as before,  to f/u any worsening symptoms or concerns BP Readings from Last 3 Encounters:  05/28/14 126/86  10/14/12 162/100  11/30/11 170/104

## 2014-05-28 NOTE — Assessment & Plan Note (Signed)
stable overall by history and exam, recent data reviewed with pt, and pt to continue medical treatment as before,  to f/u any worsening symptoms or concerns Lab Results  Component Value Date   LDLCALC * 07/29/2008    125        Total Cholesterol/HDL:CHD Risk Coronary Heart Disease Risk Table                     Men   Women  1/2 Average Risk   3.4   3.3  Average Risk       5.0   4.4  2 X Average Risk   9.6   7.1  3 X Average Risk  23.4   11.0        Use the calculated Patient Ratio above and the CHD Risk Table to determine the patient's CHD Risk.        ATP III CLASSIFICATION (LDL):  <100     mg/dL   Optimal  100-129  mg/dL   Near or Above                    Optimal  130-159  mg/dL   Borderline  160-189  mg/dL   High  >190     mg/dL   Very High

## 2014-05-28 NOTE — Addendum Note (Signed)
Addended by: [name] on: 05/28/2014 11:40 AM   Modules accepted: Orders

## 2014-05-28 NOTE — Patient Instructions (Addendum)
You had the new Prevnar pneumonia shot today  Please continue all other medications as before, and refills have been done if requested.  Please have the pharmacy call with any other refills you may need.  Please continue your efforts at being more active, low cholesterol diet, and weight control.  You are otherwise up to date with prevention measures today.  Please keep your appointments with your specialists as you may have planned  Please go to the LAB in the Basement (turn left off the elevator) for the tests to be done today  You will be contacted by phone if any changes need to be made immediately.  Otherwise, you will receive a letter about your results with an explanation, but please check with MyChart first.  Please remember to sign up for MyChart if you have not done so, as this will be important to you in the future with finding out test results, communicating by private email, and scheduling acute appointments online when needed.  Please return in 1 year for your yearly visit, or sooner if needed 

## 2014-05-28 NOTE — Progress Notes (Signed)
Subjective:    Patient ID: [name], male    DOB: Mar 23, 1950, 64 y.o.   MRN: [id number]  HPI  .Here for yearly f/u;  Overall doing ok;  Pt denies Chest pain, worsening SOB, DOE, wheezing, orthopnea, PND, worsening LE edema, palpitations, dizziness or syncope.  Pt denies neurological change such as new headache, facial or extremity weakness.  Pt denies polydipsia, polyuria, or low sugar symptoms. Pt states overall good compliance with treatment and medications, good tolerability, and has been trying to follow appropriate diet.  Pt denies worsening depressive symptoms, suicidal ideation or panic. No fever, night sweats, wt loss, loss of appetite, or other constitutional symptoms.  Pt states good ability with ADL's, has low fall risk, home safety reviewed and adequate, no other significant changes in hearing or vision, and only occasionally active with exercise. No current complaints  Still smoking - 1 ppd x 40 yrs.  For start low dose CT lung Ca screening;  Per CMS guidelines, I have determined the eligibility including patient age (38-80), and absence of any signs of lung cancer.  Specific calculation of number of pack years is documented.  Quit smoking years is documented.  Shared decision making engaged today, including a discussion of the benefits and harms of screening, discussion of need for followup with additional testing, risks of over-diagnosis, risk of false-positive screening examinations, and risk of radiation exposure.  Counseling done today on the importance of adherence to annual lunge cancer LDCT screening, importance of quit smoking and remaining quit, and tobacco cessation instructions given.  There is also discussion with patient regarding the impact of comorbidities, and patient states is able and willing to undergo diagnosis and treatment. Past Medical History  Diagnosis Date  . Hypertension   . Hyperlipidemia   . Hyperplastic colon polyp 04/21/2011  . PVD (peripheral vascular  disease) 04/21/2011  . Hx of CABG     May, 2010  . Ejection fraction     EF 35-40%, echo, May, 2010  . Abdominal aortic aneurysm     AAA And bilateral common iliac artery aneurysms  . Tobacco abuse   . Pulmonary nodule     Chest CT May, 2013, 2 small pulmonary nodules, needs appropriate followup,  this CT was not ordered by the cardiology team  . Preop cardiovascular exam     Cardiac clearance for major vascular surgery, May, 2013  . CAD (coronary artery disease)     Dr Ron Parker @ [place]   . AAA (abdominal aortic aneurysm)   . COPD (chronic obstructive pulmonary disease) 04/21/2011    smoking  . Shortness of breath   . BPH (benign prostatic hyperplasia)   . Retention of urine   . Foley catheter in place   . Chronic mental illness 04/21/2011    Diagnosis unclear,  Family provides good care   Past Surgical History  Procedure Laterality Date  . Coronary artery bypass graft  2008  . Abdominal aorta stent  07/2011  . Abdominal aortic aneurysm repair  07/2011  . Transurethral resection of prostate  11/29/2011    Procedure: TRANSURETHRAL RESECTION OF THE PROSTATE WITH GYRUS INSTRUMENTS;  Surgeon: Malka So, MD;  Location: [facility];  Service: Urology;  Laterality: N/A;  Prostate Ultrasound, and Biopsy  . Abdominal aortagram N/A 07/26/2011    Procedure: ABDOMINAL Maxcine Ham;  Surgeon: Conrad Gratiot, MD;  Location: [facility] CATH LAB;  Service: Cardiovascular;  Laterality: N/A;  . Embolization Left 07/26/2011    Procedure: EMBOLIZATION;  Surgeon:  Conrad Archuleta, MD;  Location: [facility] CATH LAB;  Service: Cardiovascular;  Laterality: Left;    reports that he has been smoking Cigarettes.  He has been smoking about 1.00 pack per day. He has never used smokeless tobacco. He reports that he does not drink alcohol or use illicit drugs. family history includes Arthritis in his sister; Cancer in his father; Diabetes in his father; Heart attack (age of onset: 8) in his father; Heart disease in his father; Hyperlipidemia in  his sister; Hypertension in his sister. There is no history of Anesthesia problems. Allergies  Allergen Reactions  . Ace Inhibitors Itching   Current Outpatient Prescriptions on File Prior to Visit  Medication Sig Dispense Refill  . aspirin EC 81 MG tablet Take 81 mg by mouth every evening.    [name] CRESTOR 20 MG tablet TAKE 1 TABLET BY MOUTH ONCE DAILY 30 tablet 0  . losartan (COZAAR) 50 MG tablet TAKE 1 TABLET BY MOUTH DAILY 30 tablet 0  . metoprolol succinate (TOPROL-XL) 25 MG 24 hr tablet TAKE 1 TABLET BY MOUTH EVERY EVENING 30 tablet 0  . omeprazole (PRILOSEC) 10 MG capsule TAKE ONE CAPSULE BY MOUTH EVERY DAY 30 capsule 0   No current facility-administered medications on file prior to visit.   Review of Systems Constitutional: Negative for increased diaphoresis, other activity, appetite or siginficant weight change other than noted HENT: Negative for worsening hearing loss, ear pain, facial swelling, mouth sores and neck stiffness.   Eyes: Negative for other worsening pain, redness or visual disturbance.  Respiratory: Negative for shortness of breath and wheezing  Cardiovascular: Negative for chest pain and palpitations.  Gastrointestinal: Negative for diarrhea, blood in stool, abdominal distention or other pain Genitourinary: Negative for hematuria, flank pain or change in urine volume.  Musculoskeletal: Negative for myalgias or other joint complaints.  Skin: Negative for color change and wound or drainage.  Neurological: Negative for syncope and numbness. other than noted Hematological: Negative for adenopathy. or other swelling Psychiatric/Behavioral: Negative for hallucinations, SI, self-injury, decreased concentration or other worsening agitation.      Objective:   Physical Exam BP 126/86 mmHg  Pulse 59  Temp(Src) 97.9 F (36.6 C) (Oral)  Resp 18  Ht 6\' 3"  (1.905 m)  Wt 142 lb (64.411 kg)  BMI 17.75 kg/m2  SpO2 95% VS noted,  Constitutional: Pt is oriented to person,  place, and time. Appears well-developed and well-nourished, in no significant distress Head: Normocephalic and atraumatic.  Right Ear: External ear normal.  Left Ear: External ear normal.  Nose: Nose normal.  Mouth/Throat: Oropharynx is clear and moist.  Eyes: Conjunctivae and EOM are normal. Pupils are equal, round, and reactive to light.  Neck: Normal range of motion. Neck supple. No JVD present. No tracheal deviation present or significant neck LA or mass Cardiovascular: Normal rate, regular rhythm, normal heart sounds and intact distal pulses.   Pulmonary/Chest: Effort normal and breath sounds somewhatr decrsaed without rales or wheezing  Abdominal: Soft. Bowel sounds are normal. NT. No HSM  Musculoskeletal: Normal range of motion. Exhibits no edema.  Lymphadenopathy:  Has no cervical adenopathy.  Neurological: Pt is alert and oriented to person, place, and time. Pt has normal reflexes. No cranial nerve deficit. Motor grossly intact Skin: Skin is warm and dry. No rash noted.  Psychiatric:  Has normal mood and affect. Behavior is normal.     Assessment & Plan:

## 2014-05-28 NOTE — Addendum Note (Signed)
Addended by: Valerie Salts on: 05/28/2014 11:45 AM   Modules accepted: Orders

## 2014-05-28 NOTE — Progress Notes (Signed)
Pre visit review using our clinic review tool, if applicable. No additional management support is needed unless otherwise documented below in the visit note. 

## 2014-05-28 NOTE — Assessment & Plan Note (Signed)
Also for PSA as he is due, o.w asympt

## 2014-05-28 NOTE — Assessment & Plan Note (Signed)
Urged to quit, also for LDCT for lung cancer screening

## 2014-05-31 ENCOUNTER — Telehealth: Payer: Self-pay | Admitting: Internal Medicine

## 2014-05-31 NOTE — Telephone Encounter (Signed)
Patient sister, calling for the result of his lab work. please advise

## 2014-05-31 NOTE — Telephone Encounter (Signed)
Called pt sister [name]) no answer LMOM with md response...[name]

## 2014-06-15 ENCOUNTER — Inpatient Hospital Stay: Admission: RE | Admit: 2014-06-15 | Payer: Medicare Other | Source: Ambulatory Visit

## 2014-06-25 ENCOUNTER — Inpatient Hospital Stay: Admission: RE | Admit: 2014-06-25 | Payer: Medicare Other | Source: Ambulatory Visit

## 2014-06-25 ENCOUNTER — Telehealth: Payer: Self-pay | Admitting: Internal Medicine

## 2014-06-25 NOTE — Telephone Encounter (Signed)
Patient was scheduled for a CT low dose - he was a no show. She called several times.

## 2014-08-24 ENCOUNTER — Encounter: Payer: Self-pay | Admitting: Internal Medicine

## 2015-06-01 ENCOUNTER — Other Ambulatory Visit: Payer: Self-pay | Admitting: Internal Medicine

## 2016-01-24 ENCOUNTER — Other Ambulatory Visit: Payer: Self-pay | Admitting: Internal Medicine

## 2016-03-28 ENCOUNTER — Encounter (HOSPITAL_COMMUNITY): Payer: Self-pay | Admitting: Emergency Medicine

## 2016-03-28 ENCOUNTER — Emergency Department (HOSPITAL_COMMUNITY): Payer: Medicare Other

## 2016-03-28 ENCOUNTER — Observation Stay (HOSPITAL_COMMUNITY): Payer: Medicare Other

## 2016-03-28 ENCOUNTER — Observation Stay (HOSPITAL_COMMUNITY)
Admission: EM | Admit: 2016-03-28 | Discharge: 2016-03-30 | Disposition: A | Discharge status: Home / Self Care | Payer: Medicare Other | Attending: Internal Medicine | Admitting: Internal Medicine

## 2016-03-28 DIAGNOSIS — Z888 Allergy status to other drugs, medicaments and biological substances status: Secondary | ICD-10-CM | POA: Insufficient documentation

## 2016-03-28 DIAGNOSIS — Z7982 Long term (current) use of aspirin: Secondary | ICD-10-CM | POA: Insufficient documentation

## 2016-03-28 DIAGNOSIS — I2510 Atherosclerotic heart disease of native coronary artery without angina pectoris: Secondary | ICD-10-CM | POA: Diagnosis not present

## 2016-03-28 DIAGNOSIS — I110 Hypertensive heart disease with heart failure: Secondary | ICD-10-CM | POA: Insufficient documentation

## 2016-03-28 DIAGNOSIS — I723 Aneurysm of iliac artery: Secondary | ICD-10-CM | POA: Insufficient documentation

## 2016-03-28 DIAGNOSIS — I5042 Chronic combined systolic (congestive) and diastolic (congestive) heart failure: Secondary | ICD-10-CM | POA: Insufficient documentation

## 2016-03-28 DIAGNOSIS — Z8679 Personal history of other diseases of the circulatory system: Secondary | ICD-10-CM | POA: Insufficient documentation

## 2016-03-28 DIAGNOSIS — F17210 Nicotine dependence, cigarettes, uncomplicated: Secondary | ICD-10-CM | POA: Insufficient documentation

## 2016-03-28 DIAGNOSIS — J449 Chronic obstructive pulmonary disease, unspecified: Secondary | ICD-10-CM | POA: Diagnosis not present

## 2016-03-28 DIAGNOSIS — N179 Acute kidney failure, unspecified: Secondary | ICD-10-CM | POA: Diagnosis not present

## 2016-03-28 DIAGNOSIS — I10 Essential (primary) hypertension: Secondary | ICD-10-CM | POA: Diagnosis present

## 2016-03-28 DIAGNOSIS — I255 Ischemic cardiomyopathy: Secondary | ICD-10-CM | POA: Diagnosis not present

## 2016-03-28 DIAGNOSIS — R229 Localized swelling, mass and lump, unspecified: Secondary | ICD-10-CM

## 2016-03-28 DIAGNOSIS — R55 Syncope and collapse: Secondary | ICD-10-CM | POA: Diagnosis not present

## 2016-03-28 DIAGNOSIS — R911 Solitary pulmonary nodule: Secondary | ICD-10-CM | POA: Insufficient documentation

## 2016-03-28 DIAGNOSIS — R918 Other nonspecific abnormal finding of lung field: Secondary | ICD-10-CM | POA: Diagnosis present

## 2016-03-28 DIAGNOSIS — I739 Peripheral vascular disease, unspecified: Secondary | ICD-10-CM | POA: Insufficient documentation

## 2016-03-28 DIAGNOSIS — R42 Dizziness and giddiness: Secondary | ICD-10-CM | POA: Diagnosis not present

## 2016-03-28 DIAGNOSIS — Z86010 Personal history of colonic polyps: Secondary | ICD-10-CM | POA: Diagnosis not present

## 2016-03-28 DIAGNOSIS — I088 Other rheumatic multiple valve diseases: Secondary | ICD-10-CM | POA: Insufficient documentation

## 2016-03-28 DIAGNOSIS — R079 Chest pain, unspecified: Secondary | ICD-10-CM | POA: Diagnosis not present

## 2016-03-28 DIAGNOSIS — I714 Abdominal aortic aneurysm, without rupture, unspecified: Secondary | ICD-10-CM | POA: Diagnosis present

## 2016-03-28 DIAGNOSIS — E785 Hyperlipidemia, unspecified: Secondary | ICD-10-CM | POA: Diagnosis not present

## 2016-03-28 DIAGNOSIS — F17200 Nicotine dependence, unspecified, uncomplicated: Secondary | ICD-10-CM | POA: Diagnosis present

## 2016-03-28 DIAGNOSIS — Z951 Presence of aortocoronary bypass graft: Secondary | ICD-10-CM

## 2016-03-28 DIAGNOSIS — R404 Transient alteration of awareness: Secondary | ICD-10-CM | POA: Diagnosis not present

## 2016-03-28 DIAGNOSIS — IMO0002 Reserved for concepts with insufficient information to code with codable children: Secondary | ICD-10-CM

## 2016-03-28 LAB — CBC WITH DIFFERENTIAL/PLATELET
BASOS ABS: 0 10*3/uL (ref 0.0–0.1)
BASOS PCT: 0 %
Eosinophils Absolute: 0 10*3/uL (ref 0.0–0.7)
Eosinophils Relative: 0 %
HEMATOCRIT: 39.7 % (ref 39.0–52.0)
Hemoglobin: 13.1 g/dL (ref 13.0–17.0)
LYMPHS PCT: 16 %
Lymphs Abs: 1.2 10*3/uL (ref 0.7–4.0)
MCH: 30.3 pg (ref 26.0–34.0)
MCHC: 33 g/dL (ref 30.0–36.0)
MCV: 91.7 fL (ref 78.0–100.0)
MONO ABS: 0.5 10*3/uL (ref 0.1–1.0)
Monocytes Relative: 7 %
NEUTROS ABS: 5.6 10*3/uL (ref 1.7–7.7)
NEUTROS PCT: 77 %
Platelets: 202 10*3/uL (ref 150–400)
RBC: 4.33 MIL/uL (ref 4.22–5.81)
RDW: 14.3 % (ref 11.5–15.5)
WBC: 7.4 10*3/uL (ref 4.0–10.5)

## 2016-03-28 LAB — COMPREHENSIVE METABOLIC PANEL
ALBUMIN: 3.7 g/dL (ref 3.5–5.0)
ALT: 16 U/L — AB (ref 17–63)
AST: 27 U/L (ref 15–41)
Alkaline Phosphatase: 71 U/L (ref 38–126)
Anion gap: 9 (ref 5–15)
BUN: 21 mg/dL — ABNORMAL HIGH (ref 6–20)
CHLORIDE: 102 mmol/L (ref 101–111)
CO2: 24 mmol/L (ref 22–32)
CREATININE: 1.63 mg/dL — AB (ref 0.61–1.24)
Calcium: 9.5 mg/dL (ref 8.9–10.3)
GFR calc non Af Amer: 42 mL/min — ABNORMAL LOW (ref >60)
GFR, EST AFRICAN AMERICAN: 49 mL/min — AB (ref >60)
GLUCOSE: 104 mg/dL — AB (ref 65–99)
Potassium: 4.5 mmol/L (ref 3.5–5.1)
SODIUM: 135 mmol/L (ref 135–145)
Total Bilirubin: 0.4 mg/dL (ref 0.3–1.2)
Total Protein: 7.3 g/dL (ref 6.5–8.1)

## 2016-03-28 LAB — CBG MONITORING, ED: GLUCOSE-CAPILLARY: 87 mg/dL (ref 65–99)

## 2016-03-28 LAB — CREATININE, SERUM
CREATININE: 1.11 mg/dL (ref 0.61–1.24)
GFR calc Af Amer: >60 mL/min (ref >60)
GFR calc non Af Amer: >60 mL/min (ref >60)

## 2016-03-28 LAB — CBC
HCT: 41.7 % (ref 39.0–52.0)
Hemoglobin: 13.7 g/dL (ref 13.0–17.0)
MCH: 30.2 pg (ref 26.0–34.0)
MCHC: 32.9 g/dL (ref 30.0–36.0)
MCV: 92.1 fL (ref 78.0–100.0)
PLATELETS: 196 10*3/uL (ref 150–400)
RBC: 4.53 MIL/uL (ref 4.22–5.81)
RDW: 14.5 % (ref 11.5–15.5)
WBC: 7 10*3/uL (ref 4.0–10.5)

## 2016-03-28 LAB — TROPONIN I: Troponin I: <0.03 ng/mL (ref <0.03)

## 2016-03-28 MED ORDER — SODIUM CHLORIDE 0.9 % IV SOLN
INTRAVENOUS | Status: DC
Start: 2016-03-28 — End: 2016-03-29
  Administered 2016-03-28: 23:00:00 via INTRAVENOUS

## 2016-03-28 MED ORDER — ONDANSETRON HCL 4 MG/2ML IJ SOLN: 4.0000 mg | Freq: Four times a day (QID) | INTRAMUSCULAR | Status: DC | PRN

## 2016-03-28 MED ORDER — ACETAMINOPHEN 650 MG RE SUPP: 650.0000 mg | Freq: Four times a day (QID) | RECTAL | Status: DC | PRN

## 2016-03-28 MED ORDER — ACETAMINOPHEN 325 MG PO TABS: 650.0000 mg | ORAL_TABLET | Freq: Four times a day (QID) | ORAL | Status: DC | PRN

## 2016-03-28 MED ORDER — ENOXAPARIN SODIUM 40 MG/0.4ML ~~LOC~~ SOLN
40.0000 mg | SUBCUTANEOUS | Status: DC
  Administered 2016-03-29 – 2016-03-30 (×2): 40 mg via SUBCUTANEOUS
  Filled 2016-03-28 (×2): qty 0.4

## 2016-03-28 MED ORDER — ONDANSETRON HCL 4 MG PO TABS: 4.0000 mg | ORAL_TABLET | Freq: Four times a day (QID) | ORAL | Status: DC | PRN

## 2016-03-28 MED ORDER — ASPIRIN EC 81 MG PO TBEC
81.0000 mg | DELAYED_RELEASE_TABLET | Freq: Every evening | ORAL | Status: DC
  Administered 2016-03-28 – 2016-03-29 (×2): 81 mg via ORAL
  Filled 2016-03-28 (×2): qty 1

## 2016-03-28 MED ORDER — ROSUVASTATIN CALCIUM 10 MG PO TABS
40.0000 mg | ORAL_TABLET | Freq: Every day | ORAL | Status: DC
  Administered 2016-03-29: 40 mg via ORAL
  Filled 2016-03-28: qty 4

## 2016-03-28 NOTE — H&P (Signed)
History and Physical    [name] YVO:[id number] DOB: 10/24/1950 DOA: 03/28/2016  PCP: [name], MD  Patient coming from: Home.  History obtained from patient's sister with home patient lives. Patient has some cognitive defects to give proper history.  Chief Complaint: Fall.  HPI: [name] is a 66 y.o. male with history of CAD status post CABG, abdominal aortic aneurysm, chronic systolic and diastolic CHF last evening measured in 2010 was 35-40% with grade 1 diastolic dysfunction, ongoing tobacco abuse was brought to the ER after patient had a fall at his house. Patient's sister states patient walked from the kitchen to his room when patient's sister heard a sound. Patient states he suddenly felt dizzy and fell. Denies hitting his head or losing consciousness. Did not have incontinence of urine or tongue bite. Had mild pain in the right rib area after the fall. In the ER chest x-ray showed lung mass for which CT chest was done which showed lung mass concerning for malignancy. EKG was showing normal sinus rhythm with nonspecific ST changes with normal QTC. Patient is being admitted for further observation for near syncope.  Patient has not been to a physician for almost a year now. Patient only takes aspirin and statins.  ED Course:  CT chest was showing lung mass. CT head was unremarkable. EKG shows normal sinus rhythm with nonspecific ST changes. QTC was within acceptable limits. Labs reveal acute renal failure.  Review of Systems: As per HPI, rest all negative.   Past Medical History:  Diagnosis Date  . AAA (abdominal aortic aneurysm) ([place])   . Abdominal aortic aneurysm ([place])    AAA And bilateral common iliac artery aneurysms  . BPH (benign prostatic hyperplasia)   . CAD (coronary artery disease)    Dr Ron Parker @ [facility]   . Chronic mental illness 04/21/2011   Diagnosis unclear,  Family provides good care  . COPD (chronic obstructive pulmonary disease) ([place]) 04/21/2011   smoking  . Ejection fraction    EF 35-40%, echo, May, 2010  . Foley catheter in place   . Hx of CABG    May, 2010  . Hyperlipidemia   . Hyperplastic colon polyp 04/21/2011  . Hypertension   . Preop cardiovascular exam    Cardiac clearance for major vascular surgery, May, 2013  . Pulmonary nodule    Chest CT May, 2013, 2 small pulmonary nodules, needs appropriate followup,  this CT was not ordered by the cardiology team  . PVD (peripheral vascular disease) ([place]) 04/21/2011  . Retention of urine   . Shortness of breath   . Tobacco abuse     Past Surgical History:  Procedure Laterality Date  . ABDOMINAL AORTA STENT  07/2011  . ABDOMINAL AORTAGRAM N/A 07/26/2011   Procedure: ABDOMINAL Maxcine Ham;  Surgeon: Conrad Swayzee, MD;  Location: [facility] CATH LAB;  Service: Cardiovascular;  Laterality: N/A;  . ABDOMINAL AORTIC ANEURYSM REPAIR  07/2011  . CORONARY ARTERY BYPASS GRAFT  2008  . EMBOLIZATION Left 07/26/2011   Procedure: EMBOLIZATION;  Surgeon: Conrad , MD;  Location: [facility] CATH LAB;  Service: Cardiovascular;  Laterality: Left;  . TRANSURETHRAL RESECTION OF PROSTATE  11/29/2011   Procedure: TRANSURETHRAL RESECTION OF THE PROSTATE WITH GYRUS INSTRUMENTS;  Surgeon: Malka So, MD;  Location: [facility] ORS;  Service: Urology;  Laterality: N/A;  Prostate Ultrasound, and Biopsy     reports that he has been smoking Cigarettes.  He has been smoking about 1.00 pack per day. He  has never used smokeless tobacco. He reports that he does not drink alcohol or use drugs.  Allergies  Allergen Reactions  . Ace Inhibitors Itching    Family History  Problem Relation Age of Onset  . Cancer Father     prostate  . Heart disease Father   . Diabetes Father   . Heart attack Father 62  . Arthritis Sister   . Hyperlipidemia Sister   . Hypertension Sister   . Anesthesia problems Neg Hx     Prior to Admission medications   Medication Sig Start Date End Date Taking? Authorizing Provider  aspirin EC 81 MG tablet  Take 81 mg by mouth every evening.   Yes Historical Provider, MD  rosuvastatin (CRESTOR) 40 MG tablet TAKE 1 TABLET(40 MG) BY MOUTH DAILY 01/24/16  Yes [name], MD  losartan (COZAAR) 50 MG tablet TAKE 1 TABLET BY MOUTH DAILY Patient not taking: Reported on 03/28/2016 02/02/14   [name], MD  metoprolol succinate (TOPROL-XL) 25 MG 24 hr tablet TAKE 1 TABLET BY MOUTH EVERY EVENING Patient not taking: Reported on 03/28/2016 02/02/14   [name], MD  omeprazole (PRILOSEC) 10 MG capsule TAKE ONE CAPSULE BY MOUTH EVERY DAY Patient not taking: Reported on 03/28/2016 02/02/14   [name], MD    Physical Exam: Vitals:   03/28/16 1901 03/28/16 1915 03/28/16 1930 03/28/16 2000  BP: 130/82 126/89 123/87 113/83  Pulse: 75 79 78 74  Resp: '18 17 14 16  '$ Temp:      TempSrc:      SpO2: 98% 98% 97% 96%      Constitutional: Moderately built and nourished. Vitals:   03/28/16 1901 03/28/16 1915 03/28/16 1930 03/28/16 2000  BP: 130/82 126/89 123/87 113/83  Pulse: 75 79 78 74  Resp: '18 17 14 16  '$ Temp:      TempSrc:      SpO2: 98% 98% 97% 96%   Eyes: Anicteric no pallor. ENMT:  No discharge from ears eyes nose and mouth. Neck:  No mass felt. No neck rigidity no JVD appreciated. Respiratory:  No rhonchi or crepitations. Cardiovascular:  S1-S2 heard no murmurs appreciated. Abdomen:  Soft nontender bowel sounds present. No guarding or rigidity. Musculoskeletal:  No edema. No joint effusion. Skin:  No rash. Skin appears warm. Neurologic: alert awake oriented to time place and person. Moves all extremities 5 x 5. No facial asymmetry. Tongue is midline. Psychiatric:  Has some cognitive difficulties.   Labs on Admission: I have personally reviewed following labs and imaging studies  CBC:  Recent Labs Lab 03/28/16 1526  WBC 7.4  NEUTROABS 5.6  HGB 13.1  HCT 39.7  MCV 91.7  PLT 370   Basic Metabolic Panel:  Recent Labs Lab 03/28/16 1526  NA 135  K 4.5  CL 102  CO2 24    GLUCOSE 104*  BUN 21*  CREATININE 1.63*  CALCIUM 9.5   GFR: CrCl cannot be calculated (Unknown ideal weight.). Liver Function Tests:  Recent Labs Lab 03/28/16 1526  AST 27  ALT 16*  ALKPHOS 71  BILITOT 0.4  PROT 7.3  ALBUMIN 3.7   No results for input(s): LIPASE, AMYLASE in the last 168 hours. No results for input(s): AMMONIA in the last 168 hours. Coagulation Profile: No results for input(s): INR, PROTIME in the last 168 hours. Cardiac Enzymes:  Recent Labs Lab 03/28/16 1526  TROPONINI <0.03   BNP (last 3 results) No results for input(s): PROBNP in the last 8760  hours. HbA1C: No results for input(s): HGBA1C in the last 72 hours. CBG:  Recent Labs Lab 03/28/16 1530  GLUCAP 87   Lipid Profile: No results for input(s): CHOL, HDL, LDLCALC, TRIG, CHOLHDL, LDLDIRECT in the last 72 hours. Thyroid Function Tests: No results for input(s): TSH, T4TOTAL, FREET4, T3FREE, THYROIDAB in the last 72 hours. Anemia Panel: No results for input(s): VITAMINB12, FOLATE, FERRITIN, TIBC, IRON, RETICCTPCT in the last 72 hours. Urine analysis:    Component Value Date/Time   COLORURINE YELLOW 05/28/2014 1146   APPEARANCEUR CLEAR 05/28/2014 1146   LABSPEC 1.025 05/28/2014 1146   PHURINE 6.0 05/28/2014 1146   GLUCOSEU NEGATIVE 05/28/2014 1146   HGBUR NEGATIVE 05/28/2014 1146   BILIRUBINUR NEGATIVE 05/28/2014 1146   KETONESUR NEGATIVE 05/28/2014 1146   PROTEINUR NEGATIVE 08/06/2011 1524   UROBILINOGEN 0.2 05/28/2014 1146   NITRITE NEGATIVE 05/28/2014 1146   LEUKOCYTESUR NEGATIVE 05/28/2014 1146   Sepsis Labs: '@LABRCNTIP'$ (procalcitonin:4,lacticidven:4) )No results found for this or any previous visit (from the past 240 hour(s)).   Radiological Exams on Admission: Dg Chest 2 View  Result Date: 03/28/2016 CLINICAL DATA:  Syncope, chest pain for 1 day. History of COPD, hypertension, pulmonary nodule. EXAM: CHEST  2 VIEW COMPARISON:  Chest radiograph Jul 31, 2011 FINDINGS: LEFT  upper lobe mass density with central air density. Increased lung volumes with chronic interstitial changes. No pleural effusion. Cardiomediastinal silhouette is normal. Status post median sternotomy for CABG. Mildly calcified aortic knob. No pneumothorax. Soft tissue planes and included osseous structures are nonsuspicious. Partially imaged abdominal aortic stent graft. IMPRESSION: New masslike density LEFT upper lobe for which CT chest with contrast is recommended. COPD. Electronically Signed   By: Elon Alas M.D.   On: 03/28/2016 15:28   Ct Head Wo Contrast  Result Date: 03/28/2016 CLINICAL DATA:  Known chest mass, evaluate for brain mass EXAM: CT HEAD WITHOUT CONTRAST TECHNIQUE: Contiguous axial images were obtained from the base of the skull through the vertex without intravenous contrast. COMPARISON:  CT chest 03/28/2016, CT head 07/28/2008 FINDINGS: Brain: No acute territorial infarction, intracranial hemorrhage or extra-axial fluid collection. No focal mass, mass effect or midline shift. Dense and prominent basal ganglial calcifications as before. Ventricles stable in size. Mild atrophy. Vascular: No hyperdense vessels. Scattered calcifications within the carotid siphons. Mild dolichoectasia of the vertebral basilar system. Skull: Normal. Negative for fracture or focal lesion. Sinuses/Orbits: Mucosal thickening within the sphenoid ethmoid and maxillary sinuses with probable postsurgical changes of right maxillary sinus. No acute orbital abnormality. Other: None IMPRESSION: No CT evidence for acute intracranial abnormality. Electronically Signed   By: Donavan Foil M.D.   On: 03/28/2016 20:44   Ct Chest Wo Contrast  Result Date: 03/28/2016 CLINICAL DATA:  Left chest mass. EXAM: CT CHEST WITHOUT CONTRAST TECHNIQUE: Multidetector CT imaging of the chest was performed following the standard protocol without IV contrast. COMPARISON:  Chest x-ray day 03/28/2016 FINDINGS: Cardiovascular: The heart  size is normal. No pericardial effusion. Coronary artery calcification is noted. Ascending thoracic aorta measures 4 cm diameter. Atherosclerotic calcification is noted in the wall of the thoracic aorta. Mediastinum/Nodes: No mediastinal lymphadenopathy. No evidence for gross hilar lymphadenopathy although assessment is limited by the lack of intravenous contrast on today's study. The esophagus has normal imaging features. There is no axillary lymphadenopathy. Lungs/Pleura: 3.3 x 4.5 x 4.7 cm spiculated cavitary mass left upper lobe consistent with neoplasm. Moderate to severe emphysema noted. Upper Abdomen: Aortic endograft incompletely visualized. Musculoskeletal: Bone windows reveal no worrisome lytic or sclerotic  osseous lesions. IMPRESSION: 4.7 cm spiculated cavitary mass left upper lobe consistent with neoplasm. Electronically Signed   By: Misty Stanley M.D.   On: 03/28/2016 17:18    EKG: Independently reviewed.  Normal sinus rhythm with nonspecific disease changes. QTC is 446 ms.  Assessment/Plan Principal Problem:   Near syncope Active Problems:   COPD (chronic obstructive pulmonary disease) (HCC)   Hypertension   Hx of CABG   #1. Near syncope - we'll monitor in telemetry for any arrhythmias. Check orthostatics. Check 2-D echo. #2. Lung mass will need pulmonologist referral for biopsy. Did discuss with patient's sister that patient will need further workup on his lung mass. #3. Acute renal failure - gently hydrate and recheck metabolic panel in a.m. Cautione on hydrating since patient has history of CHF. #4. History of CAD status post CABG - denies any chest pain. Check 2-D echo. Continue statins and aspirin. #5. History of abdominal aortic aneurysm - denies abdominal pain. #6. Tobacco abuse - tobacco cessation counseling requested. #7. History of systolic and diastolic CHF last EF measured in 2010 was 35-40% with grade 1 diastolic dysfunction - appears compensated presently on no  medication.  DVT prophylaxis:  Lovenox. Code Status:  Full code.  Family Communication:  Patient's sister.  Disposition Plan:  Home.  Consults called:  None.  Admission status:  Observation.    Rise Patience MD [facility] Pager [phone].  If 7PM-7AM, please contact night-coverage [email or web address] Password [facility]  03/28/2016, 8:51 PM

## 2016-03-28 NOTE — ED Notes (Signed)
Pt to xray

## 2016-03-28 NOTE — ED Provider Notes (Signed)
[facility] DEPT Provider Note   CSN: [id number] Arrival date & time: 03/28/16  1424  By signing my name below, I, Evelene Croon, attest that this documentation has been prepared under the direction and in the presence of Carmin Muskrat, MD . Electronically Signed: Evelene Croon, Scribe. 03/28/2016. 2:34 PM.  History   Chief Complaint Chief Complaint  Patient presents with  . Dizziness  . Fall   The history is provided by the patient. No language interpreter was used.     HPI Comments:  [name] is a 66 y.o. male who presents to the Emergency Department s/p fall about 30 minutes PTA Pt states he felt dizzy prior to the fall. LOC is unclear. He is only complaining of pain to his right rib area which he has been experiencing intermittently. He states he was at his baseline when he woke up this AM. He denies dizziness, lightheadedness, weakness in his extremities, and nausea at this time. Pt has a h/o CAD, AAA and HTN.  Patient has cognitive delay, this complicates the history of present illness somewhat      Past Medical History:  Diagnosis Date  . AAA (abdominal aortic aneurysm) ([place])   . Abdominal aortic aneurysm ([place])    AAA And bilateral common iliac artery aneurysms  . BPH (benign prostatic hyperplasia)   . CAD (coronary artery disease)    Dr Ron Parker @ [facility]   . Chronic mental illness 04/21/2011   Diagnosis unclear,  Family provides good care  . COPD (chronic obstructive pulmonary disease) ([place]) 04/21/2011   smoking  . Ejection fraction    EF 35-40%, echo, May, 2010  . Foley catheter in place   . Hx of CABG    May, 2010  . Hyperlipidemia   . Hyperplastic colon polyp 04/21/2011  . Hypertension   . Preop cardiovascular exam    Cardiac clearance for major vascular surgery, May, 2013  . Pulmonary nodule    Chest CT May, 2013, 2 small pulmonary nodules, needs appropriate followup,  this CT was not ordered by the cardiology team  . PVD (peripheral vascular disease)  ([place]) 04/21/2011  . Retention of urine   . Shortness of breath   . Tobacco abuse     Patient Active Problem List   Diagnosis Date Noted  . Smoker 05/28/2014  . Abdominal aneurysm without mention of rupture 08/31/2011  . Aneurysm of iliac artery ([place]) 08/31/2011  . Preop cardiovascular exam   . Abdominal aortic aneurysm ([place])   . Pulmonary nodule   . Hypertension   . Hyperlipidemia   . CAD (coronary artery disease)   . Hx of CABG   . Ejection fraction   . Tobacco abuse   . Iliac artery aneurysm, bilateral ([place]) 06/22/2011  . Fatigue 04/23/2011  . Bladder neck obstruction 04/23/2011  . PSA elevation 04/23/2011  . Preventative health care 04/21/2011  . COPD (chronic obstructive pulmonary disease) ([place]) 04/21/2011  . Hyperplastic colon polyp 04/21/2011  . PVD (peripheral vascular disease) ([place]) 04/21/2011  . Gastritis and duodenitis 04/21/2011  . Chronic mental illness 04/21/2011  . CONSTIPATION 11/12/2008  . CORONARY ARTERY BYPASS GRAFT, HX OF 11/09/2008  . CARDIOMYOPATHY, ISCHEMIC 08/23/2008  . HEMOCCULT POSITIVE STOOL 08/23/2008    Past Surgical History:  Procedure Laterality Date  . ABDOMINAL AORTA STENT  07/2011  . ABDOMINAL AORTAGRAM N/A 07/26/2011   Procedure: ABDOMINAL Maxcine Ham;  Surgeon: Conrad [place], MD;  [facility] CATH LAB;  Service: Cardiovascular;  Laterality: N/A;  . ABDOMINAL  AORTIC ANEURYSM REPAIR  07/2011  . CORONARY ARTERY BYPASS GRAFT  2008  . EMBOLIZATION Left 07/26/2011   Procedure: EMBOLIZATION;  Surgeon: Conrad [place], MD;  Location: [name] CATH LAB;  Service: Cardiovascular;  Laterality: Left;  . TRANSURETHRAL RESECTION OF PROSTATE  11/29/2011   Procedure: TRANSURETHRAL RESECTION OF THE PROSTATE WITH GYRUS INSTRUMENTS;  Surgeon: Malka So, MD;  Location: [facility] ORS;  Service: Urology;  Laterality: N/A;  Prostate Ultrasound, and Biopsy       Home Medications    Prior to Admission medications   Medication Sig Start Date End Date Taking? Authorizing  Provider  aspirin EC 81 MG tablet Take 81 mg by mouth every evening.    Historical Provider, MD  losartan (COZAAR) 50 MG tablet TAKE 1 TABLET BY MOUTH DAILY 02/02/14   [name], MD  metoprolol succinate (TOPROL-XL) 25 MG 24 hr tablet TAKE 1 TABLET BY MOUTH EVERY EVENING 02/02/14   [name], MD  omeprazole (PRILOSEC) 10 MG capsule TAKE ONE CAPSULE BY MOUTH EVERY DAY 02/02/14   [name], MD  rosuvastatin (CRESTOR) 40 MG tablet TAKE 1 TABLET(40 MG) BY MOUTH DAILY 01/24/16   [name], MD    Family History Family History  Problem Relation Age of Onset  . Cancer Father     prostate  . Heart disease Father   . Diabetes Father   . Heart attack Father 74  . Arthritis Sister   . Hyperlipidemia Sister   . Hypertension Sister   . Anesthesia problems Neg Hx     Social History Social History  Substance Use Topics  . Smoking status: Current Every Day Smoker    Packs/day: 1.00    Types: Cigarettes  . Smokeless tobacco: Never Used  . Alcohol use No     Allergies   Ace inhibitors   Review of Systems Review of Systems  Constitutional:       Per HPI, otherwise negative  HENT:       Per HPI, otherwise negative  Respiratory:       Per HPI, otherwise negative  Cardiovascular:       Per HPI, otherwise negative  Gastrointestinal: Negative for vomiting.  Endocrine:       Negative aside from HPI  Genitourinary:       Neg aside from HPI   Musculoskeletal:       Per HPI, otherwise negative  Skin: Negative.   Neurological: Positive for syncope and weakness.     Physical Exam Updated Vital Signs BP 129/100 (BP Location: Left Arm)   Pulse 63   Temp 97.8 F (36.6 C) (Oral)   Resp 18   SpO2 100%   Physical Exam  Constitutional: He appears well-developed. No distress.  HENT:  Head: Normocephalic and atraumatic.  Eyes: Conjunctivae and EOM are normal.  Cardiovascular: Normal rate and regular rhythm.   Pulmonary/Chest: Effort normal. No stridor. No respiratory  distress.  Midline sternal scar  Abdominal: He exhibits no distension.  Musculoskeletal: He exhibits no edema.  Neurological: He is alert.  Skin: Skin is warm and dry.  Psychiatric: Cognition and memory are impaired.  Nursing note and vitals reviewed.    ED Treatments / Results  DIAGNOSTIC STUDIES:  Oxygen Saturation is 100% on RA, normal by my interpretation.    COORDINATION OF CARE:  2:34 PM Discussed treatment plan with pt at bedside and pt agreed to plan.  Labs (all labs ordered are listed, but only abnormal results are displayed)  Labs Reviewed  COMPREHENSIVE METABOLIC PANEL - Abnormal; Notable for the following:       Result Value   Glucose, Bld 104 (*)    BUN 21 (*)    Creatinine, Ser 1.63 (*)    ALT 16 (*)    GFR calc non Af Amer 42 (*)    GFR calc Af Amer 49 (*)    All other components within normal limits  CBC WITH DIFFERENTIAL/PLATELET  TROPONIN I  POCT CBG (FASTING - GLUCOSE)-MANUAL ENTRY  CBG MONITORING, ED    EKG  EKG Interpretation  Date/Time:  Wednesday March 28 2016 14:33:50 EST Ventricular Rate:  65 PR Interval:    QRS Duration: 97 QT Interval:  467 QTC Calculation: 486 R Axis:   65 Text Interpretation:  Sinus rhythm substnatial artefact T wave abnormality Abnormal ekg Confirmed by Carmin Muskrat  MD [phone]) on 03/28/2016 7:48:27 PM       Radiology Dg Chest 2 View  Result Date: 03/28/2016 CLINICAL DATA:  Syncope, chest pain for 1 day. History of COPD, hypertension, pulmonary nodule. EXAM: CHEST  2 VIEW COMPARISON:  Chest radiograph Jul 31, 2011 FINDINGS: LEFT upper lobe mass density with central air density. Increased lung volumes with chronic interstitial changes. No pleural effusion. Cardiomediastinal silhouette is normal. Status post median sternotomy for CABG. Mildly calcified aortic knob. No pneumothorax. Soft tissue planes and included osseous structures are nonsuspicious. Partially imaged abdominal aortic stent graft. IMPRESSION: New  masslike density LEFT upper lobe for which CT chest with contrast is recommended. COPD. Electronically Signed   By: Elon Alas M.D.   On: 03/28/2016 15:28   Ct Chest Wo Contrast  Result Date: 03/28/2016 CLINICAL DATA:  Left chest mass. EXAM: CT CHEST WITHOUT CONTRAST TECHNIQUE: Multidetector CT imaging of the chest was performed following the standard protocol without IV contrast. COMPARISON:  Chest x-ray day 03/28/2016 FINDINGS: Cardiovascular: The heart size is normal. No pericardial effusion. Coronary artery calcification is noted. Ascending thoracic aorta measures 4 cm diameter. Atherosclerotic calcification is noted in the wall of the thoracic aorta. Mediastinum/Nodes: No mediastinal lymphadenopathy. No evidence for gross hilar lymphadenopathy although assessment is limited by the lack of intravenous contrast on today's study. The esophagus has normal imaging features. There is no axillary lymphadenopathy. Lungs/Pleura: 3.3 x 4.5 x 4.7 cm spiculated cavitary mass left upper lobe consistent with neoplasm. Moderate to severe emphysema noted. Upper Abdomen: Aortic endograft incompletely visualized. Musculoskeletal: Bone windows reveal no worrisome lytic or sclerotic osseous lesions. IMPRESSION: 4.7 cm spiculated cavitary mass left upper lobe consistent with neoplasm. Electronically Signed   By: Misty Stanley M.D.   On: 03/28/2016 17:18    Procedures Procedures (including critical care time)   Initial Impression / Assessment and Plan / ED Course  I have reviewed the triage vital signs and the nursing notes.  Pertinent labs & imaging results that were available during my care of the patient were reviewed by me and considered in my medical decision making (see chart for details).  Clinical Course     On repeat exam the patient is in no distress. I again discussed all findings with him, subsequent discussed findings with patient sister, as the patient has known cognitive impairment. We  discussed importance of admission, for further evaluation and management given the episode of syncope, with newly identified cancer. After discussion with our hospitalist colleagues, the patient will also have CT scan, head, prior to admission.   Final Clinical Impressions(s) / ED Diagnoses   Final diagnoses:  Syncope, unspecified syncope type  Lung mass    I personally performed the services described in this documentation, which was scribed in my presence. The recorded information has been reviewed and is accurate.       Carmin Muskrat, MD 03/28/16 1949

## 2016-03-28 NOTE — ED Notes (Signed)
Report attempted 

## 2016-03-28 NOTE — ED Notes (Signed)
Pt to CT

## 2016-03-28 NOTE — ED Triage Notes (Signed)
Per ems, from home, got dizzy and fell in the bathroom, denies hitting head, denies LOC. No complaints at this time, not on blood thinners, pt c/o R sided pain that's a chronic issue. Pt ambulated from stretcher to bed, denies any dizziness.

## 2016-03-28 NOTE — ED Notes (Signed)
Sister of pt, [name], requests an update. Number is in the chart.

## 2016-03-28 NOTE — ED Notes (Signed)
Pt ambulating in hallway to bathroom without difficulty

## 2016-03-28 NOTE — ED Notes (Signed)
Pt given turkey sandwich and water

## 2016-03-28 NOTE — ED Notes (Signed)
Patient undressed, in gown, on monitor, continuous pulse oximetry

## 2016-03-29 ENCOUNTER — Observation Stay (HOSPITAL_COMMUNITY): Payer: Medicare Other

## 2016-03-29 ENCOUNTER — Observation Stay (HOSPITAL_BASED_OUTPATIENT_CLINIC_OR_DEPARTMENT_OTHER): Payer: Medicare Other

## 2016-03-29 ENCOUNTER — Encounter (HOSPITAL_COMMUNITY): Payer: Self-pay | Admitting: General Practice

## 2016-03-29 DIAGNOSIS — I5042 Chronic combined systolic (congestive) and diastolic (congestive) heart failure: Secondary | ICD-10-CM | POA: Diagnosis not present

## 2016-03-29 DIAGNOSIS — R918 Other nonspecific abnormal finding of lung field: Secondary | ICD-10-CM | POA: Diagnosis not present

## 2016-03-29 DIAGNOSIS — J449 Chronic obstructive pulmonary disease, unspecified: Secondary | ICD-10-CM

## 2016-03-29 DIAGNOSIS — R55 Syncope and collapse: Secondary | ICD-10-CM | POA: Diagnosis not present

## 2016-03-29 DIAGNOSIS — R42 Dizziness and giddiness: Secondary | ICD-10-CM | POA: Diagnosis not present

## 2016-03-29 DIAGNOSIS — I255 Ischemic cardiomyopathy: Secondary | ICD-10-CM | POA: Diagnosis not present

## 2016-03-29 DIAGNOSIS — R911 Solitary pulmonary nodule: Secondary | ICD-10-CM | POA: Diagnosis not present

## 2016-03-29 DIAGNOSIS — R935 Abnormal findings on diagnostic imaging of other abdominal regions, including retroperitoneum: Secondary | ICD-10-CM | POA: Diagnosis not present

## 2016-03-29 DIAGNOSIS — F17210 Nicotine dependence, cigarettes, uncomplicated: Secondary | ICD-10-CM | POA: Diagnosis not present

## 2016-03-29 DIAGNOSIS — I088 Other rheumatic multiple valve diseases: Secondary | ICD-10-CM | POA: Diagnosis not present

## 2016-03-29 DIAGNOSIS — N179 Acute kidney failure, unspecified: Secondary | ICD-10-CM | POA: Diagnosis not present

## 2016-03-29 DIAGNOSIS — E785 Hyperlipidemia, unspecified: Secondary | ICD-10-CM | POA: Diagnosis not present

## 2016-03-29 DIAGNOSIS — I110 Hypertensive heart disease with heart failure: Secondary | ICD-10-CM | POA: Diagnosis not present

## 2016-03-29 DIAGNOSIS — I2510 Atherosclerotic heart disease of native coronary artery without angina pectoris: Secondary | ICD-10-CM | POA: Diagnosis not present

## 2016-03-29 DIAGNOSIS — S0990XA Unspecified injury of head, initial encounter: Secondary | ICD-10-CM | POA: Diagnosis not present

## 2016-03-29 DIAGNOSIS — I714 Abdominal aortic aneurysm, without rupture: Secondary | ICD-10-CM | POA: Diagnosis not present

## 2016-03-29 LAB — BASIC METABOLIC PANEL
Anion gap: 7 (ref 5–15)
BUN: 18 mg/dL (ref 6–20)
CALCIUM: 9 mg/dL (ref 8.9–10.3)
CHLORIDE: 106 mmol/L (ref 101–111)
CO2: 25 mmol/L (ref 22–32)
CREATININE: 1.18 mg/dL (ref 0.61–1.24)
GFR calc Af Amer: >60 mL/min (ref >60)
GFR calc non Af Amer: >60 mL/min (ref >60)
GLUCOSE: 124 mg/dL — AB (ref 65–99)
Potassium: 4.1 mmol/L (ref 3.5–5.1)
Sodium: 138 mmol/L (ref 135–145)

## 2016-03-29 LAB — CBC
HCT: 40.2 % (ref 39.0–52.0)
Hemoglobin: 12.9 g/dL — ABNORMAL LOW (ref 13.0–17.0)
MCH: 29.7 pg (ref 26.0–34.0)
MCHC: 32.1 g/dL (ref 30.0–36.0)
MCV: 92.4 fL (ref 78.0–100.0)
PLATELETS: 210 10*3/uL (ref 150–400)
RBC: 4.35 MIL/uL (ref 4.22–5.81)
RDW: 14.7 % (ref 11.5–15.5)
WBC: 5.8 10*3/uL (ref 4.0–10.5)

## 2016-03-29 LAB — ECHOCARDIOGRAM COMPLETE: Weight: 2077.62 oz

## 2016-03-29 LAB — TROPONIN I

## 2016-03-29 MED ORDER — IOPAMIDOL (ISOVUE-300) INJECTION 61%
INTRAVENOUS | Status: AC
  Administered 2016-03-29: 30 mL
  Filled 2016-03-29: qty 30

## 2016-03-29 MED ORDER — IOPAMIDOL (ISOVUE-300) INJECTION 61%
100.0000 mL | Freq: Once | INTRAVENOUS | Status: AC | PRN
  Administered 2016-03-29: 100 mL via INTRAVENOUS

## 2016-03-29 MED ORDER — GADOBENATE DIMEGLUMINE 529 MG/ML IV SOLN
15.0000 mL | Freq: Once | INTRAVENOUS | Status: AC | PRN
  Administered 2016-03-29: 13 mL via INTRAVENOUS

## 2016-03-29 NOTE — Progress Notes (Signed)
Request seen for evaluation for biopsy of lung mass.  Dr. Anselm Pancoast reviewed CT scan.  Recommend pulmonary evaluation for possible bronchoscopy.  If unable to obtain biopsy via bronch, recommend PET scan.  WENDY S BLAIR PA-C 03/29/2016 12:05 PM

## 2016-03-29 NOTE — Care Management Obs Status (Signed)
[facility] NOTIFICATION   Patient Details  Name: [name] MRN: [id number] Date of Birth: [birth year]   Medicare Observation Status Notification Given:  Yes    Dawayne Patricia, RN 03/29/2016, 1:51 PM

## 2016-03-29 NOTE — Progress Notes (Addendum)
Patient ID: [name], male   DOB: Aug 03, 1950, 66 y.o.   MRN: [id number]  PROGRESS NOTE    BENJIMEN KELLEY  TIW:[id number] DOB: 1950/04/14 DOA: 03/28/2016  PCP: [name], MD   Brief Narrative:  66 y.o. male with history of CAD status post CABG, abdominal aortic aneurysm, chronic systolic and diastolic CHF, ECHO in 2505 was 35-40% with grade 1 diastolic dysfunction, ongoing tobacco abuse. He presented to [facility] status post fall. Pt reported he felt dizzy and fell.  Pt was hemodynamically stable in ED. CT head showed no acute intracranial findings. CXR showed new mass like density in left upper lung lobe which was confirmed on CT scan and concerning for malignancy.    Assessment & Plan:   Principal Problem: Fall / Syncope - Unclear etiology but considering pt has a lung mass concerning for malignancy I think it is reasonable to obtain MRI brain to rule out mets - Obtain 2 D ECHO - Obtain PT eval - Trop WNL  Active Problems:   COPD (chronic obstructive pulmonary disease) (HCC) - Stable respiratory status    Lung mass - Concerning for malignancy - Obtain CT abdomen and pelvis to look for any other potential source of malignancy - Order also placed for MRI brain - May need biopsy but will talk to pulmonary if can be done here or outpt    Dyslipidemia - Continue Crestor    CAD - Continue aspirin daily    DVT prophylaxis: Lovenox subQ Code Status: full code  Family Communication: no family at the bedside this am Disposition Plan: needs further work up on his lung mass, MRI brain to rule out mets    Consultants:   IR for possible biopsy   Procedures:   2 D ECHO - pending   Antimicrobials:   None    Subjective: No overnight events.   Objective: Vitals:   03/28/16 1930 03/28/16 2000 03/28/16 2234 03/29/16 0437  BP: 123/87 113/83 (!) 144/89 104/64  Pulse: 78 74 71 71  Resp: '14 16 20 20  '$ Temp:   98.6 F (37 C) 98.7 F (37.1 C)  TempSrc:   Oral Oral  SpO2:  97% 96% 99% 97%  Weight:   58.9 kg (129 lb 13.6 oz)     Intake/Output Summary (Last 24 hours) at 03/29/16 1126 Last data filed at 03/29/16 0828  Gross per 24 hour  Intake           845.83 ml  Output              700 ml  Net           145.83 ml   Filed Weights   03/28/16 2234  Weight: 58.9 kg (129 lb 13.6 oz)    Examination:  General exam: Appears calm and comfortable  Respiratory system: Clear to auscultation. Respiratory effort normal. Cardiovascular system: S1 & S2 heard, Rate controlled  Gastrointestinal system: Abdomen is nondistended, soft and nontender. No organomegaly or masses felt. Normal bowel sounds heard. Central nervous system: Alert and oriented. No focal neurological deficits. Extremities: Symmetric 5 x 5 power. Skin: No rashes, lesions or ulcers Psychiatry: Judgement and insight appear normal. Mood & affect appropriate.   Data Reviewed: I have personally reviewed following labs and imaging studies  CBC:  Recent Labs Lab 03/28/16 1526 03/28/16 2105 03/29/16 0904  WBC 7.4 7.0 5.8  NEUTROABS 5.6  --   --   HGB 13.1 13.7 12.9*  HCT 39.7 41.7 40.2  MCV  91.7 92.1 92.4  PLT 202 196 409   Basic Metabolic Panel:  Recent Labs Lab 03/28/16 1526 03/28/16 2105 03/29/16 0904  NA 135  --  138  K 4.5  --  4.1  CL 102  --  106  CO2 24  --  25  GLUCOSE 104*  --  124*  BUN 21*  --  18  CREATININE 1.63* 1.11 1.18  CALCIUM 9.5  --  9.0   GFR: CrCl cannot be calculated (Unknown ideal weight.). Liver Function Tests:  Recent Labs Lab 03/28/16 1526  AST 27  ALT 16*  ALKPHOS 71  BILITOT 0.4  PROT 7.3  ALBUMIN 3.7   No results for input(s): LIPASE, AMYLASE in the last 168 hours. No results for input(s): AMMONIA in the last 168 hours. Coagulation Profile: No results for input(s): INR, PROTIME in the last 168 hours. Cardiac Enzymes:  Recent Labs Lab 03/28/16 1526 03/28/16 2105 03/29/16 0310 03/29/16 0904  TROPONINI <0.03 <0.03 <0.03 <0.03    BNP (last 3 results) No results for input(s): PROBNP in the last 8760 hours. HbA1C: No results for input(s): HGBA1C in the last 72 hours. CBG:  Recent Labs Lab 03/28/16 1530  GLUCAP 87   Lipid Profile: No results for input(s): CHOL, HDL, LDLCALC, TRIG, CHOLHDL, LDLDIRECT in the last 72 hours. Thyroid Function Tests: No results for input(s): TSH, T4TOTAL, FREET4, T3FREE, THYROIDAB in the last 72 hours. Anemia Panel: No results for input(s): VITAMINB12, FOLATE, FERRITIN, TIBC, IRON, RETICCTPCT in the last 72 hours. Urine analysis:    Component Value Date/Time   COLORURINE YELLOW 05/28/2014 1146   APPEARANCEUR CLEAR 05/28/2014 1146   LABSPEC 1.025 05/28/2014 1146   PHURINE 6.0 05/28/2014 1146   GLUCOSEU NEGATIVE 05/28/2014 1146   HGBUR NEGATIVE 05/28/2014 1146   BILIRUBINUR NEGATIVE 05/28/2014 1146   KETONESUR NEGATIVE 05/28/2014 1146   PROTEINUR NEGATIVE 08/06/2011 1524   UROBILINOGEN 0.2 05/28/2014 1146   NITRITE NEGATIVE 05/28/2014 1146   LEUKOCYTESUR NEGATIVE 05/28/2014 1146   Sepsis Labs: '@LABRCNTIP'$ (procalcitonin:4,lacticidven:4)   )No results found for this or any previous visit (from the past 240 hour(s)).    Radiology Studies: Dg Chest 2 View Result Date: 03/28/2016 New masslike density LEFT upper lobe for which CT chest with contrast is recommended. COPD.   Ct Head Wo Contrast Result Date: 03/28/2016 No CT evidence for acute intracranial abnormality. Electronically Signed   By: Donavan Foil M.D.   On: 03/28/2016 20:44   Ct Chest Wo Contrast Result Date: 03/28/2016 4.7 cm spiculated cavitary mass left upper lobe consistent with neoplasm.     Scheduled Meds: . aspirin EC  81 mg Oral QPM  . enoxaparin (LOVENOX) injection  40 mg Subcutaneous Q24H  . rosuvastatin  40 mg Oral q1800   Continuous Infusions:   LOS: 0 days    Time spent: 15 minutes  Greater than 50% of the time spent on counseling and coordinating the care.   Leisa Lenz, MD [facility] Pager [phone]  If 7PM-7AM, please contact night-coverage [email or web address] Password [facility] 03/29/2016, 11:26 AM

## 2016-03-29 NOTE — Progress Notes (Signed)
  Echocardiogram 2D Echocardiogram has been performed.  [name] 03/29/2016, 11:37 AM

## 2016-03-29 NOTE — Progress Notes (Signed)
Pt arrived from ER via gurney accompanied by tech. Pt was alert and oriented with stable vital on room air. He did not appear to be in any acute distress, no complaints of pain. Pt was comfortable. He was oriented to room and taught to use call bell. Will continue to assess

## 2016-03-30 DIAGNOSIS — Z951 Presence of aortocoronary bypass graft: Secondary | ICD-10-CM

## 2016-03-30 DIAGNOSIS — J449 Chronic obstructive pulmonary disease, unspecified: Secondary | ICD-10-CM | POA: Diagnosis not present

## 2016-03-30 DIAGNOSIS — R55 Syncope and collapse: Secondary | ICD-10-CM | POA: Diagnosis not present

## 2016-03-30 DIAGNOSIS — R918 Other nonspecific abnormal finding of lung field: Secondary | ICD-10-CM | POA: Diagnosis not present

## 2016-03-30 MED ORDER — POLYETHYLENE GLYCOL 3350 17 G PO PACK
17.0000 g | PACK | Freq: Every day | ORAL | Status: DC
  Administered 2016-03-30: 17 g via ORAL
  Filled 2016-03-30: qty 1

## 2016-03-30 NOTE — Discharge Instructions (Signed)
Near-Syncope °Introduction °Near-syncope is when you suddenly get weak or dizzy, or you feel like you might pass out (faint). During an episode of near-syncope, you may: °· Feel dizzy or light-headed. °· Feel sick to your stomach (nauseous). °· See all white or all black. °· Have cold, clammy skin. °If you passed out, get help right away.Call your local emergency services (911 in the [place] Do not drive yourself to the hospital. °Follow these instructions at home: °Pay attention to any changes in your symptoms. Take these actions to help with your condition: °· Have someone stay with you until you feel stable. °· Do not drive, use machinery, or play sports until your doctor says it is okay. °· Keep all follow-up visits as told by your doctor. This is important. °· If you start to feel like you might pass out, lie down right away and raise (elevate) your feet above the level of your heart. Breathe deeply and steadily. Wait until all of the symptoms are gone. °· Drink enough fluid to keep your pee (urine) clear or pale yellow. °· If you are taking blood pressure or heart medicine, get up slowly and spend many minutes getting ready to sit and then stand. This can help with dizziness. °· Take over-the-counter and prescription medicines only as told by your doctor. °Get help right away if: °· You have a very bad headache. °· You have unusual pain in your chest, tummy, or back. °· You are bleeding from your mouth or rectum. °· You have black or tarry poop (stool). °· You have a very fast or uneven heartbeat (palpitations). °· You pass out one time or more than once. °· You have jerky movements that you cannot control (seizure). °· You are confused. °· You have trouble walking. °· You are very weak. °· You have vision problems. °These symptoms may be an emergency. Do not wait to see if the symptoms will go away. Get medical help right away. Call your local emergency services (911 in the [place] Do not drive yourself to the  hospital.  °This information is not intended to replace advice given to you by your health care provider. Make sure you discuss any questions you have with your health care provider. °Document Released: 08/15/2007 Document Revised: 08/04/2015 Document Reviewed: 11/10/2014 °© 2017 Elsevier ° °

## 2016-03-30 NOTE — Discharge Summary (Signed)
Physician Discharge Summary  [name] PZW:[id number] DOB: 11/05/1950 DOA: 03/28/2016  PCP: Adrian Cower, MD  Admit date: 03/28/2016 Discharge date: 03/30/2016  Recommendations for Outpatient Follow-up:  1. Please follow up with pulm and cardio per sch appt 2. Pt has appt sch for FOB 04/09/16 at 8:15 am 3. Continue meds as per prior to the admission   Discharge Diagnoses:  Principal Problem:   Near syncope Active Problems:   CORONARY ARTERY BYPASS GRAFT, HX OF   COPD (chronic obstructive pulmonary disease) (HCC)   Abdominal aortic aneurysm (HCC)   Lung mass   Hypertension   CAD (coronary artery disease)   Hx of CABG   Smoker    Discharge Condition: stable   Diet recommendation: as tolerated   History of present illness:  66 y.o.malewith history of CAD status post CABG, abdominal aortic aneurysm, chronic systolic and diastolic CHF, ECHO in 4235 was 35-40% with grade 1 diastolic dysfunction, ongoing tobacco abuse. He presented to [facility] status post fall. Pt reported he felt dizzy and fell.  Pt was hemodynamically stable in ED. CT head showed no acute intracranial findings. CXR showed new mass like density in left upper lung lobe which was confirmed on CT scan and concerning for malignancy.    Hospital Course:   Principal Problem: Fall / Syncope - No evidence of metastatic disea on CT or MRI brain - Seen by PT, no PT follow up required - 2 D ECHO with EF 30-35%, left ventricular diastolic function parameters were normal. - Trop WNL  Active Problems:   Chronic systolic CHF - As noted above EF 30-35% on ECHO on this admission - Cardio sch outpt follow up, listed in follow up sch in AVS - Pt has losartan and metoprolol listed on him meds review but not taking those meds - Resp status stable and BP 111/71    COPD (chronic obstructive pulmonary disease) (HCC) - Stable respiratory status    Lung mass - Concerning for malignancy - No evidence of metastatic disease on  MRI brain or CT abd/pelvis - Pulm to arrange outpt follow up for FOB    Dyslipidemia - Continue Crestor    CAD - Continue aspirin daily    DVT prophylaxis: Lovenox subQ Code Status: full code  Family Communication: no family at the bedside this am    Consultants:   IR   Pulm  Cardio   Procedures:   2 D ECHO - EF 30-35%, normal LV diastolic parameter   Antimicrobials:   None    Signed:  Leisa Lenz, MD  [facility] 03/30/2016, 1:25 PM  Pager #: [phone]  Time spent in minutes: less than 30 minutes    Discharge Exam: Vitals:   03/29/16 2001 03/30/16 0354  BP: 131/78 111/71  Pulse: 74 76  Resp: 20 18  Temp: 97.7 F (36.5 C) 97.9 F (36.6 C)   Vitals:   03/29/16 0437 03/29/16 1218 03/29/16 2001 03/30/16 0354  BP: 104/64 130/80 131/78 111/71  Pulse: 71 64 74 76  Resp: '20 18 20 18  '$ Temp: 98.7 F (37.1 C)  97.7 F (36.5 C) 97.9 F (36.6 C)  TempSrc: Oral  Oral Oral  SpO2: 97% 98% 98% 97%  Weight:        General: Pt is alert, follows commands appropriately, not in acute distress Cardiovascular: Regular rate and rhythm, S1/S2 +, no murmurs Respiratory: Clear to auscultation bilaterally, no wheezing, no crackles, no rhonchi Abdominal: Soft, non tender, non distended, bowel sounds +, no  guarding Extremities: no edema, no cyanosis, pulses palpable bilaterally DP and PT Neuro: Grossly nonfocal  Discharge Instructions  Discharge Instructions    Call MD for:  persistant nausea and vomiting    Complete by:  As directed    Call MD for:  redness, tenderness, or signs of infection (pain, swelling, redness, odor or green/yellow discharge around incision site)    Complete by:  As directed    Call MD for:  severe uncontrolled pain    Complete by:  As directed    Diet - low sodium heart healthy    Complete by:  As directed    Discharge instructions    Complete by:  As directed    Pulmonary will set patient up for FOB at [facility] on 04/09/16 at [place] with Dr. Lake Bells.    Increase activity slowly    Complete by:  As directed      Allergies as of 03/30/2016      Reactions   Ace Inhibitors Itching      Medication List    STOP taking these medications   losartan 50 MG tablet Commonly known as:  COZAAR   omeprazole 10 MG capsule Commonly known as:  PRILOSEC     TAKE these medications   aspirin EC 81 MG tablet Take 81 mg by mouth every evening.   metoprolol succinate 25 MG 24 hr tablet Commonly known as:  TOPROL-XL TAKE 1 TABLET BY MOUTH EVERY EVENING   rosuvastatin 40 MG tablet Commonly known as:  CRESTOR TAKE 1 TABLET(40 MG) BY MOUTH DAILY      Follow-up Information    Adrian Maffucci, MD Follow up on 04/09/2016.   Specialty:  Pulmonary Disease Why:  At 0815(come 30 minutes early) come to [facility] admiiting for admission for procedure. Contact information: [address] [place] [address] [phone]        Adrian Cower, MD. Schedule an appointment as soon as possible for a visit in 1 week(s).   Specialties:  Internal Medicine, Radiology Contact information: [place] [address] [phone]        Cecilie Kicks, NP Follow up on 04/02/2016.   Specialties:  Cardiology, Radiology Why:  Cardiology Follow-Up on 04/02/2016 at 11:00AM.  Contact information: [place] [address] [phone]            The results of significant diagnostics from this hospitalization (including imaging, microbiology, ancillary and laboratory) are listed below for reference.    Significant Diagnostic Studies: Dg Chest 2 View  Result Date: 03/28/2016 CLINICAL DATA:  Syncope, chest pain for 1 day. History of COPD, hypertension, pulmonary nodule. EXAM: CHEST  2 VIEW COMPARISON:  Chest radiograph Jul 31, 2011 FINDINGS: LEFT upper lobe mass density with central air density. Increased lung volumes with chronic interstitial changes. No  pleural effusion. Cardiomediastinal silhouette is normal. Status post median sternotomy for CABG. Mildly calcified aortic knob. No pneumothorax. Soft tissue planes and included osseous structures are nonsuspicious. Partially imaged abdominal aortic stent graft. IMPRESSION: New masslike density LEFT upper lobe for which CT chest with contrast is recommended. COPD. Electronically Signed   By: Elon Alas M.D.   On: 03/28/2016 15:28   Ct Head Wo Contrast  Result Date: 03/28/2016 CLINICAL DATA:  Known chest mass, evaluate for brain mass EXAM: CT HEAD WITHOUT CONTRAST TECHNIQUE: Contiguous axial images were obtained from the base of the skull through the vertex without intravenous contrast. COMPARISON:  CT chest 03/28/2016, CT head 07/28/2008 FINDINGS: Brain: No acute territorial infarction, intracranial hemorrhage or extra-axial fluid collection. No focal mass, mass effect or midline shift. Dense and prominent basal ganglial calcifications as before. Ventricles stable in size. Mild atrophy. Vascular: No hyperdense vessels. Scattered calcifications within the carotid siphons. Mild dolichoectasia of the vertebral basilar system. Skull: Normal. Negative for fracture or focal lesion. Sinuses/Orbits: Mucosal thickening within the sphenoid ethmoid and maxillary sinuses with probable postsurgical changes of right maxillary sinus. No acute orbital abnormality. Other: None IMPRESSION: No CT evidence for acute intracranial abnormality. Electronically Signed   By: Donavan Foil M.D.   On: 03/28/2016 20:44   Ct Chest Wo Contrast  Result Date: 03/28/2016 CLINICAL DATA:  Left chest mass. EXAM: CT CHEST WITHOUT CONTRAST TECHNIQUE: Multidetector CT imaging of the chest was performed following the standard protocol without IV contrast. COMPARISON:  Chest x-ray day 03/28/2016 FINDINGS: Cardiovascular: The heart size is normal. No pericardial effusion. Coronary artery calcification is noted. Ascending thoracic aorta measures  4 cm diameter. Atherosclerotic calcification is noted in the wall of the thoracic aorta. Mediastinum/Nodes: No mediastinal lymphadenopathy. No evidence for gross hilar lymphadenopathy although assessment is limited by the lack of intravenous contrast on today's study. The esophagus has normal imaging features. There is no axillary lymphadenopathy. Lungs/Pleura: 3.3 x 4.5 x 4.7 cm spiculated cavitary mass left upper lobe consistent with neoplasm. Moderate to severe emphysema noted. Upper Abdomen: Aortic endograft incompletely visualized. Musculoskeletal: Bone windows reveal no worrisome lytic or sclerotic osseous lesions. IMPRESSION: 4.7 cm spiculated cavitary mass left upper lobe consistent with neoplasm. Electronically Signed   By: Misty Stanley M.D.   On: 03/28/2016 17:18   Mr Jeri Cos KP Contrast  Result Date: 03/29/2016 CLINICAL DATA:  66 y/o  M; dizziness with fall.  Lung mass. EXAM: MRI HEAD WITHOUT AND WITH CONTRAST TECHNIQUE: Multiplanar, multiecho pulse sequences of the brain and surrounding structures were obtained without and with intravenous contrast. CONTRAST:  64m MULTIHANCE GADOBENATE DIMEGLUMINE 529 MG/ML IV SOLN COMPARISON:  03/28/2016 CT head. FINDINGS: Brain: No diffusion signal abnormality. No abnormal susceptibility hypointensity. Few foci of T2 FLAIR hyperintensity in periventricular white matter compatible with mild chronic microvascular ischemic changes. There is mild brain parenchymal volume loss. No abnormal enhancement of the brain. No focal mass effect, hydrocephalus, or extra-axial collection. Vascular: Increased T2 signal within the normally enhancing left upper internal jugular vein and sigmoid sinus is likely due to slow flow. Vascular flow voids are otherwise unremarkable. Skull and upper cervical spine: Normal marrow signal. Sinuses/Orbits: Mild diffuse paranasal sinus mucosal thickening and partial opacification of left posterior ethmoid air cells. No abnormal signal of mastoid  air cells. Other: None. IMPRESSION: 1. No acute intracranial abnormality identified. No abnormal enhancement of the brain. 2. Mild chronic microvascular ischemic changes of the brain. 3. Mild paranasal sinus disease. Electronically Signed   By: LKristine GarbeM.D.   On: 03/29/2016 23:56   Ct Abdomen Pelvis W Contrast  Result Date: 03/29/2016 CLINICAL DATA:  5 cm cavitary mass of the left upper lobe. Further assessment for evidence of metastatic disease. EXAM: CT ABDOMEN AND PELVIS WITH CONTRAST TECHNIQUE: Multidetector CT imaging of the abdomen and pelvis was performed using the standard protocol following bolus administration of intravenous contrast. CONTRAST:  1036mISOVUE-300 IOPAMIDOL (ISOVUE-300) INJECTION 61% COMPARISON:  CT of the chest on 03/28/2016. CTA of the abdomen and pelvis on 08/31/2011. FINDINGS: Lower chest: Emphysematous lung disease and bibasilar scarring present. Hepatobiliary: No focal liver abnormality is seen.  No gallstones, gallbladder wall thickening, or biliary dilatation. Pancreas: Unremarkable. No pancreatic ductal dilatation or surrounding inflammatory changes. Spleen: Normal in size without focal abnormality. Adrenals/Urinary Tract: Adrenal glands are unremarkable. Kidneys are normal, without renal calculi, focal lesion, or hydronephrosis. Bladder is unremarkable. Stomach/Bowel: No evidence of bowel obstruction, inflammation or lesion. No free air. Vascular/Lymphatic: Aortic endograft again present showing normal and stable patency and position. There is no evidence of endoleak with further contraction of the aortic sac surrounding the endograft. The excluded sac now measures 2.3 x 2.9 cm (previously 3.4 x 3.6 cm). No enlarged lymph nodes identified. Musculoskeletal: No acute or significant osseous findings. IMPRESSION: 1. No evidence of metastatic disease in the abdomen or pelvis. 2. Further decrease in size of excluded abdominal aortic aneurysm sac surrounding a normally  patent endograft. Electronically Signed   By: Aletta Edouard M.D.   On: 03/29/2016 15:04    Microbiology: No results found for this or any previous visit (from the past 240 hour(s)).   Labs: Basic Metabolic Panel:  Recent Labs Lab 03/28/16 1526 03/28/16 2105 03/29/16 0904  NA 135  --  138  K 4.5  --  4.1  CL 102  --  106  CO2 24  --  25  GLUCOSE 104*  --  124*  BUN 21*  --  18  CREATININE 1.63* 1.11 1.18  CALCIUM 9.5  --  9.0   Liver Function Tests:  Recent Labs Lab 03/28/16 1526  AST 27  ALT 16*  ALKPHOS 71  BILITOT 0.4  PROT 7.3  ALBUMIN 3.7   No results for input(s): LIPASE, AMYLASE in the last 168 hours. No results for input(s): AMMONIA in the last 168 hours. CBC:  Recent Labs Lab 03/28/16 1526 03/28/16 2105 03/29/16 0904  WBC 7.4 7.0 5.8  NEUTROABS 5.6  --   --   HGB 13.1 13.7 12.9*  HCT 39.7 41.7 40.2  MCV 91.7 92.1 92.4  PLT 202 196 210   Cardiac Enzymes:  Recent Labs Lab 03/28/16 1526 03/28/16 2105 03/29/16 0310 03/29/16 0904  TROPONINI <0.03 <0.03 <0.03 <0.03   BNP: BNP (last 3 results) No results for input(s): BNP in the last 8760 hours.  ProBNP (last 3 results) No results for input(s): PROBNP in the last 8760 hours.  CBG:  Recent Labs Lab 03/28/16 1530  GLUCAP 87

## 2016-03-30 NOTE — Evaluation (Signed)
Physical Therapy Evaluation Patient Details Name: [name] MRN: [id number] DOB: 1950-05-18 Today's Date: 03/30/2016   History of Present Illness  Patient is a 66 yo male admitted 03/28/16 after near syncope with fall.  Patient found to have LUL mass.   PMH:  chronic mental illness, CAD, CABG, AAA, CHF, tobacco use, COPD, HTN, PVD  Clinical Impression  Patient presents with problems listed below.  Will benefit from acute PT to maximize functional independence prior to d/c home with sister.  Patient close to baseline functional level.  Do not anticipate any f/u PT needs at d/c.    Follow Up Recommendations No PT follow up;Supervision for mobility/OOB    Equipment Recommendations  None recommended by PT    Recommendations for Other Services       Precautions / Restrictions Precautions Precautions: Fall Restrictions Weight Bearing Restrictions: No      Mobility  Bed Mobility Overal bed mobility: Independent                Transfers Overall transfer level: Needs assistance Equipment used: None Transfers: Sit to/from Stand Sit to Stand: Supervision         General transfer comment: Supervision for safety.  Reports no dizziness in stance.  Ambulation/Gait Ambulation/Gait assistance: Min guard Ambulation Distance (Feet): 200 Feet Assistive device: None Gait Pattern/deviations: Step-through pattern;Decreased stride length;Shuffle;Trunk flexed;Drifts right/left Gait velocity: decreased Gait velocity interpretation: Below normal speed for age/gender General Gait Details: Patient with slow, slightly unsteady gait.  No loss of balance, able to self-correct.  Stairs            Wheelchair Mobility    Modified Rankin (Stroke Patients Only)       Balance Overall balance assessment: Needs assistance Sitting-balance support: No upper extremity supported;Feet supported Sitting balance-Leahy Scale: Good     Standing balance support: No upper extremity  supported Standing balance-Leahy Scale: Good                               Pertinent Vitals/Pain Pain Assessment: No/denies pain    Home Living Family/patient expects to be discharged to:: Private residence Living Arrangements: Other relatives (Sister) Available Help at Discharge: Family;Available 24 hours/day Type of Home: House Home Access: Stairs to enter Entrance Stairs-Rails: Psychiatric nurse of Steps: 3 Home Layout: One level Home Equipment: None Additional Comments: Information from patient - unsure of accuracy    Prior Function Level of Independence: Independent;Needs assistance   Gait / Transfers Assistance Needed: No assistive device  ADL's / Homemaking Assistance Needed: Sister prepares meals and housekeeping.        Hand Dominance        Extremity/Trunk Assessment   Upper Extremity Assessment Upper Extremity Assessment: Overall WFL for tasks assessed    Lower Extremity Assessment Lower Extremity Assessment: Generalized weakness    Cervical / Trunk Assessment Cervical / Trunk Assessment: Normal  Communication   Communication: Expressive difficulties (Difficulty understanding at times)  Cognition Arousal/Alertness: Awake/alert Behavior During Therapy: WFL for tasks assessed/performed Overall Cognitive Status: History of cognitive impairments - at baseline                 General Comments: No family present.  Patient oriented x4.    General Comments      Exercises     Assessment/Plan    PT Assessment Patient needs continued PT services  PT Problem List Decreased strength;Decreased activity tolerance;Decreased balance;Decreased mobility;Decreased cognition  PT Treatment Interventions DME instruction;Gait training;Stair training;Functional mobility training;Therapeutic activities;Patient/family education;Balance training    PT Goals (Current goals can be found in the Care Plan section)  Acute  Rehab PT Goals Patient Stated Goal: Home PT Goal Formulation: With patient Time For Goal Achievement: 04/06/16 Potential to Achieve Goals: Good    Frequency Min 3X/week   Barriers to discharge        Co-evaluation               End of Session Equipment Utilized During Treatment: Gait belt Activity Tolerance: Patient tolerated treatment well Patient left: in bed;with call bell/phone within reach;with bed alarm set Nurse Communication: Mobility status    Functional Assessment Tool Used: Clinical judgement Functional Limitation: Mobility: Walking and moving around Mobility: Walking and Moving Around Current Status (L4650): At least 1 percent but less than 20 percent impaired, limited or restricted Mobility: Walking and Moving Around Goal Status [phone]): At least 1 percent but less than 20 percent impaired, limited or restricted    Time: 1032-1043 PT Time Calculation (min) (ACUTE ONLY): 11 min   Charges:   PT Evaluation $PT Eval Moderate Complexity: 1 Procedure     PT G Codes:   PT G-Codes **NOT FOR INPATIENT CLASS** Functional Assessment Tool Used: Clinical judgement Functional Limitation: Mobility: Walking and moving around Mobility: Walking and Moving Around Current Status (K8127): At least 1 percent but less than 20 percent impaired, limited or restricted Mobility: Walking and Moving Around Goal Status [phone]): At least 1 percent but less than 20 percent impaired, limited or restricted    Despina Pole 03/30/2016, 10:55 AM Carita Pian. Sanjuana Kava, [place] Pager [phone]

## 2016-03-30 NOTE — Consult Note (Signed)
Name: [name] MRN: [id number] DOB: 08-10-1950    ADMISSION DATE:  03/28/2016 CONSULTATION DATE:  03/29/16  REFERRING MD :  [facility]  CHIEF COMPLAINT:  Passed out. Incidental lung mass  BRIEF PATIENT DESCRIPTION:  66 yo thin AAM  SIGNIFICANT EVENTS  Plan for FOB 04/09/16 0815 scheduled  STUDIES:     HISTORY OF PRESENT ILLNESS:    66 yo life long smoker,1ppd since age 82, with well documented pmh as noted who presented with an episode of syncope on 03/28/16 and an incidental finding of LUL spiculated mass (3.3x4.5x4.7). PCCM has been asked to evaluate for  possible FOB for tissue bx of LUL mass. This mass can be reached by FOB or possibly by navigation bronch. Both of these procedures can be done as a opt if he is compliant with follow up.  PAST MEDICAL HISTORY :   has a past medical history of AAA (abdominal aortic aneurysm) ([place]); Abdominal aortic aneurysm (H[facility]); BPH (benign prostatic hyperplasia); CAD (coronary artery disease); Chronic mental illness (04/21/2011); COPD (chronic obstructive pulmonary disease) ([place]) (04/21/2011); Ejection fraction; Hyperlipidemia; Hyperplastic colon polyp (04/21/2011); Hypertension; Preop cardiovascular exam; Pulmonary nodule; PVD (peripheral vascular disease) ([place]) (04/21/2011); Retention of urine; Shortness of breath; and Tobacco abuse.  has a past surgical history that includes Abdominal aorta stent (07/2011); Abdominal aortic aneurysm repair (07/2011); Transurethral resection of prostate (11/29/2011); abdominal aortagram (N/A, 07/26/2011); embolization (Left, 07/26/2011); and Coronary artery bypass graft (07/2008). Prior to Admission medications   Medication Sig Start Date End Date Taking? Authorizing Provider  aspirin EC 81 MG tablet Take 81 mg by mouth every evening.   Yes Historical Provider, MD  rosuvastatin (CRESTOR) 40 MG tablet TAKE 1 TABLET(40 MG) BY MOUTH DAILY 01/24/16  Yes [name], MD  losartan (COZAAR) 50 MG tablet TAKE 1 TABLET BY MOUTH  DAILY Patient not taking: Reported on 03/28/2016 02/02/14   [name], MD  metoprolol succinate (TOPROL-XL) 25 MG 24 hr tablet TAKE 1 TABLET BY MOUTH EVERY EVENING Patient not taking: Reported on 03/28/2016 02/02/14   [name], MD  omeprazole (PRILOSEC) 10 MG capsule TAKE ONE CAPSULE BY MOUTH EVERY DAY Patient not taking: Reported on 03/28/2016 02/02/14   [name], MD   Allergies  Allergen Reactions  . Ace Inhibitors Itching    FAMILY HISTORY:  family history includes Arthritis in his sister; Cancer in his father; Diabetes in his father; Heart attack (age of onset: 59) in his father; Heart disease in his father; Hyperlipidemia in his sister; Hypertension in his sister. SOCIAL HISTORY:  reports that he has been smoking Cigarettes.  He has a 50.00 pack-year smoking history. He has never used smokeless tobacco. He reports that he does not drink alcohol or use drugs.  REVIEW OF SYSTEMS:   [name] ACNP Maryanna Shape PCCM Pager [phone] till 3 pm If no answer page [phone] 03/30/2016, 11:26 AM   SUBJECTIVE:  NAD  VITAL SIGNS: Temp:  [97.7 F (36.5 C)-97.9 F (36.6 C)] 97.9 F (36.6 C) (01/19 0354) Pulse Rate:  [64-76] 76 (01/19 0354) Resp:  [18-20] 18 (01/19 0354) BP: (111-131)/(71-80) 111/71 (01/19 0354) SpO2:  [97 %-98 %] 97 % (01/19 0354)  PHYSICAL EXAMINATION: General:  Thin elongated AAM in NAD Neuro:  Dull affect HEENT:  NO jvd /LAN Cardiovascular:  HSR RRR Lungs:  Decreased bs bases, good air movement Abdomen: Soft +bs  Musculoskeletal:  Intact Skin:  Warm an dry   Recent Labs Lab 03/28/16 1526 03/28/16 2105 03/29/16  0904  NA 135  --  138  K 4.5  --  4.1  CL 102  --  106  CO2 24  --  25  BUN 21*  --  18  CREATININE 1.63* 1.11 1.18  GLUCOSE 104*  --  124*    Recent Labs Lab 03/28/16 1526 03/28/16 2105 03/29/16 0904  HGB 13.1 13.7 12.9*  HCT 39.7 41.7 40.2  WBC 7.4 7.0 5.8  PLT 202 196 210   Dg Chest 2 View  Result Date:  03/28/2016 CLINICAL DATA:  Syncope, chest pain for 1 day. History of COPD, hypertension, pulmonary nodule. EXAM: CHEST  2 VIEW COMPARISON:  Chest radiograph Jul 31, 2011 FINDINGS: LEFT upper lobe mass density with central air density. Increased lung volumes with chronic interstitial changes. No pleural effusion. Cardiomediastinal silhouette is normal. Status post median sternotomy for CABG. Mildly calcified aortic knob. No pneumothorax. Soft tissue planes and included osseous structures are nonsuspicious. Partially imaged abdominal aortic stent graft. IMPRESSION: New masslike density LEFT upper lobe for which CT chest with contrast is recommended. COPD. Electronically Signed   By: Elon Alas M.D.   On: 03/28/2016 15:28   Ct Head Wo Contrast  Result Date: 03/28/2016 CLINICAL DATA:  Known chest mass, evaluate for brain mass EXAM: CT HEAD WITHOUT CONTRAST TECHNIQUE: Contiguous axial images were obtained from the base of the skull through the vertex without intravenous contrast. COMPARISON:  CT chest 03/28/2016, CT head 07/28/2008 FINDINGS: Brain: No acute territorial infarction, intracranial hemorrhage or extra-axial fluid collection. No focal mass, mass effect or midline shift. Dense and prominent basal ganglial calcifications as before. Ventricles stable in size. Mild atrophy. Vascular: No hyperdense vessels. Scattered calcifications within the carotid siphons. Mild dolichoectasia of the vertebral basilar system. Skull: Normal. Negative for fracture or focal lesion. Sinuses/Orbits: Mucosal thickening within the sphenoid ethmoid and maxillary sinuses with probable postsurgical changes of right maxillary sinus. No acute orbital abnormality. Other: None IMPRESSION: No CT evidence for acute intracranial abnormality. Electronically Signed   By: Donavan Foil M.D.   On: 03/28/2016 20:44   Ct Chest Wo Contrast  Result Date: 03/28/2016 CLINICAL DATA:  Left chest mass. EXAM: CT CHEST WITHOUT CONTRAST TECHNIQUE:  Multidetector CT imaging of the chest was performed following the standard protocol without IV contrast. COMPARISON:  Chest x-ray day 03/28/2016 FINDINGS: Cardiovascular: The heart size is normal. No pericardial effusion. Coronary artery calcification is noted. Ascending thoracic aorta measures 4 cm diameter. Atherosclerotic calcification is noted in the wall of the thoracic aorta. Mediastinum/Nodes: No mediastinal lymphadenopathy. No evidence for gross hilar lymphadenopathy although assessment is limited by the lack of intravenous contrast on today's study. The esophagus has normal imaging features. There is no axillary lymphadenopathy. Lungs/Pleura: 3.3 x 4.5 x 4.7 cm spiculated cavitary mass left upper lobe consistent with neoplasm. Moderate to severe emphysema noted. Upper Abdomen: Aortic endograft incompletely visualized. Musculoskeletal: Bone windows reveal no worrisome lytic or sclerotic osseous lesions. IMPRESSION: 4.7 cm spiculated cavitary mass left upper lobe consistent with neoplasm. Electronically Signed   By: Misty Stanley M.D.   On: 03/28/2016 17:18   Mr Jeri Cos IW Contrast  Result Date: 03/29/2016 CLINICAL DATA:  66 y/o  M; dizziness with fall.  Lung mass. EXAM: MRI HEAD WITHOUT AND WITH CONTRAST TECHNIQUE: Multiplanar, multiecho pulse sequences of the brain and surrounding structures were obtained without and with intravenous contrast. CONTRAST:  63m MULTIHANCE GADOBENATE DIMEGLUMINE 529 MG/ML IV SOLN COMPARISON:  03/28/2016 CT head. FINDINGS: Brain: No diffusion signal abnormality.  No abnormal susceptibility hypointensity. Few foci of T2 FLAIR hyperintensity in periventricular white matter compatible with mild chronic microvascular ischemic changes. There is mild brain parenchymal volume loss. No abnormal enhancement of the brain. No focal mass effect, hydrocephalus, or extra-axial collection. Vascular: Increased T2 signal within the normally enhancing left upper internal jugular vein and  sigmoid sinus is likely due to slow flow. Vascular flow voids are otherwise unremarkable. Skull and upper cervical spine: Normal marrow signal. Sinuses/Orbits: Mild diffuse paranasal sinus mucosal thickening and partial opacification of left posterior ethmoid air cells. No abnormal signal of mastoid air cells. Other: None. IMPRESSION: 1. No acute intracranial abnormality identified. No abnormal enhancement of the brain. 2. Mild chronic microvascular ischemic changes of the brain. 3. Mild paranasal sinus disease. Electronically Signed   By: Kristine Garbe M.D.   On: 03/29/2016 23:56   Ct Abdomen Pelvis W Contrast  Result Date: 03/29/2016 CLINICAL DATA:  5 cm cavitary mass of the left upper lobe. Further assessment for evidence of metastatic disease. EXAM: CT ABDOMEN AND PELVIS WITH CONTRAST TECHNIQUE: Multidetector CT imaging of the abdomen and pelvis was performed using the standard protocol following bolus administration of intravenous contrast. CONTRAST:  131m ISOVUE-300 IOPAMIDOL (ISOVUE-300) INJECTION 61% COMPARISON:  CT of the chest on 03/28/2016. CTA of the abdomen and pelvis on 08/31/2011. FINDINGS: Lower chest: Emphysematous lung disease and bibasilar scarring present. Hepatobiliary: No focal liver abnormality is seen. No gallstones, gallbladder wall thickening, or biliary dilatation. Pancreas: Unremarkable. No pancreatic ductal dilatation or surrounding inflammatory changes. Spleen: Normal in size without focal abnormality. Adrenals/Urinary Tract: Adrenal glands are unremarkable. Kidneys are normal, without renal calculi, focal lesion, or hydronephrosis. Bladder is unremarkable. Stomach/Bowel: No evidence of bowel obstruction, inflammation or lesion. No free air. Vascular/Lymphatic: Aortic endograft again present showing normal and stable patency and position. There is no evidence of endoleak with further contraction of the aortic sac surrounding the endograft. The excluded sac now measures  2.3 x 2.9 cm (previously 3.4 x 3.6 cm). No enlarged lymph nodes identified. Musculoskeletal: No acute or significant osseous findings. IMPRESSION: 1. No evidence of metastatic disease in the abdomen or pelvis. 2. Further decrease in size of excluded abdominal aortic aneurysm sac surrounding a normally patent endograft. Electronically Signed   By: GAletta EdouardM.D.   On: 03/29/2016 15:04    ASSESSMENT    Lung mass, incidental finding, : 3.3 x 4.5 x 4.7 cm spiculated cavitary mass left upper lobe consistent with neoplasm.   Near syncope   CORONARY ARTERY BYPASS GRAFT, HX OF   COPD (chronic obstructive pulmonary disease) (H[facility])   Abdominal aortic aneurysm (HCC)    Hypertension   CAD (coronary artery disease)   Hx of CABG   Smoker 1 PPD since age 66  Discussion:  66yo life long smoker,1ppd since age 66 with well documented pmh as noted who presented with an episode of syncope on 03/28/16 and an incidental finding of LUL spiculated mass (3.3x4.5x4.7). PCCM has been asked to evaluate for  possible FOB for tissue bx of LUL mass. This mass can be reached by FOB or possibly by navigation bronch. Both of these procedures can be done as a opt if he is compliant with follow up.     PLAN:  [facility] Dr. MLake Bells    SRichardson LandryMinor ACNP LMaryanna ShapePCCM Pager [phone] 3 pm If no answer page [phone]/19/2018, 10:50 AM

## 2016-03-30 NOTE — Progress Notes (Signed)
Patient ID: [name], male   DOB: January 29, 1951, 66 y.o.   MRN: [id number]  PROGRESS NOTE    [name]  XIP:[id number] DOB: [birth year] DOA: 03/28/2016  PCP: [name], MD   Brief Narrative:  66 y.o. male with history of CAD status post CABG, abdominal aortic aneurysm, chronic systolic and diastolic CHF, ECHO in 6734 was 35-40% with grade 1 diastolic dysfunction, ongoing tobacco abuse. He presented to [facility] status post fall. Pt reported he felt dizzy and fell.  Pt was hemodynamically stable in ED. CT head showed no acute intracranial findings. CXR showed new mass like density in left upper lung lobe which was confirmed on CT scan and concerning for malignancy.    Assessment & Plan:   Principal Problem: Fall / Syncope - No evidence of metastatic disea on CT or MRI brain - Seen by PT, no PT follow up required - 2 D ECHO with EF 30-35%, left ventricular diastolic function parameters were normal. - Trop WNL - Appreciate cardio consult and recommendations   Active Problems:   Chronic systolic CHF - As noted above EF 30-35% on ECHO on this admission - Appreciate cardio input - Pt has losartan and metoprolol listed on him meds review but not taking those meds - Resp status stable and BP 111/71    COPD (chronic obstructive pulmonary disease) (HCC) - Stable respiratory status    Lung mass - Concerning for malignancy - No evidence of metastatic disease on MRI brain or CT abd/pelvis - Pulm will arrange outpt follow up for FOB    Dyslipidemia - Continue Crestor    CAD - Continue aspirin daily    DVT prophylaxis: Lovenox subQ Code Status: full code  Family Communication: no family at the bedside this am Disposition Plan: appreciate cardio seeing pt today and if cleared by cardio, then can go home today    Consultants:   IR   Pulm  Cardio   Procedures:   2 D ECHO - EF 30-35%, normal LV diastolic parameter   Antimicrobials:   None    Subjective: No  overnight events.   Objective: Vitals:   03/29/16 0437 03/29/16 1218 03/29/16 2001 03/30/16 0354  BP: 104/64 130/80 131/78 111/71  Pulse: 71 64 74 76  Resp: '20 18 20 18  '$ Temp: 98.7 F (37.1 C)  97.7 F (36.5 C) 97.9 F (36.6 C)  TempSrc: Oral  Oral Oral  SpO2: 97% 98% 98% 97%  Weight:        Intake/Output Summary (Last 24 hours) at 03/30/16 1149 Last data filed at 03/30/16 1111  Gross per 24 hour  Intake             1000 ml  Output             2400 ml  Net            -1400 ml   Filed Weights   03/28/16 2234  Weight: 58.9 kg (129 lb 13.6 oz)    Examination:  General exam: Appears calm and comfortable, no distress  Respiratory system: No wheezing, no rhonchi  Cardiovascular system: S1 & S2 heard, Rate controlled  Gastrointestinal system: (+) BS, non tender abdomen  Central nervous system: No focal neurological deficits. Extremities: No edema, palpable pulses  Skin: skin is warm and dry  Psychiatry: Mood & affect appropriate.   Data Reviewed: I have personally reviewed following labs and imaging studies  CBC:  Recent Labs Lab 03/28/16 1526 03/28/16 2105 03/29/16 1937  WBC 7.4 7.0 5.8  NEUTROABS 5.6  --   --   HGB 13.1 13.7 12.9*  HCT 39.7 41.7 40.2  MCV 91.7 92.1 92.4  PLT 202 196 284   Basic Metabolic Panel:  Recent Labs Lab 03/28/16 1526 03/28/16 2105 03/29/16 0904  NA 135  --  138  K 4.5  --  4.1  CL 102  --  106  CO2 24  --  25  GLUCOSE 104*  --  124*  BUN 21*  --  18  CREATININE 1.63* 1.11 1.18  CALCIUM 9.5  --  9.0   GFR: CrCl cannot be calculated (Unknown ideal weight.). Liver Function Tests:  Recent Labs Lab 03/28/16 1526  AST 27  ALT 16*  ALKPHOS 71  BILITOT 0.4  PROT 7.3  ALBUMIN 3.7   No results for input(s): LIPASE, AMYLASE in the last 168 hours. No results for input(s): AMMONIA in the last 168 hours. Coagulation Profile: No results for input(s): INR, PROTIME in the last 168 hours. Cardiac Enzymes:  Recent Labs Lab  03/28/16 1526 03/28/16 2105 03/29/16 0310 03/29/16 0904  TROPONINI <0.03 <0.03 <0.03 <0.03   BNP (last 3 results) No results for input(s): PROBNP in the last 8760 hours. HbA1C: No results for input(s): HGBA1C in the last 72 hours. CBG:  Recent Labs Lab 03/28/16 1530  GLUCAP 87   Lipid Profile: No results for input(s): CHOL, HDL, LDLCALC, TRIG, CHOLHDL, LDLDIRECT in the last 72 hours. Thyroid Function Tests: No results for input(s): TSH, T4TOTAL, FREET4, T3FREE, THYROIDAB in the last 72 hours. Anemia Panel: No results for input(s): VITAMINB12, FOLATE, FERRITIN, TIBC, IRON, RETICCTPCT in the last 72 hours. Urine analysis:    Component Value Date/Time   COLORURINE YELLOW 05/28/2014 1146   APPEARANCEUR CLEAR 05/28/2014 1146   LABSPEC 1.025 05/28/2014 1146   PHURINE 6.0 05/28/2014 1146   GLUCOSEU NEGATIVE 05/28/2014 1146   HGBUR NEGATIVE 05/28/2014 1146   BILIRUBINUR NEGATIVE 05/28/2014 1146   KETONESUR NEGATIVE 05/28/2014 1146   PROTEINUR NEGATIVE 08/06/2011 1524   UROBILINOGEN 0.2 05/28/2014 1146   NITRITE NEGATIVE 05/28/2014 1146   LEUKOCYTESUR NEGATIVE 05/28/2014 1146   Sepsis Labs: '@LABRCNTIP'$ (procalcitonin:4,lacticidven:4)   )No results found for this or any previous visit (from the past 240 hour(s)).    Radiology Studies: Dg Chest 2 View Result Date: 03/28/2016 New masslike density LEFT upper lobe for which CT chest with contrast is recommended. COPD.   Ct Head Wo Contrast Result Date: 03/28/2016 No CT evidence for acute intracranial abnormality. Electronically Signed   By: Donavan Foil M.D.   On: 03/28/2016 20:44   Ct Chest Wo Contrast Result Date: 03/28/2016 4.7 cm spiculated cavitary mass left upper lobe consistent with neoplasm.     Scheduled Meds: . aspirin EC  81 mg Oral QPM  . enoxaparin (LOVENOX) injection  40 mg Subcutaneous Q24H  . polyethylene glycol  17 g Oral Daily  . rosuvastatin  40 mg Oral q1800   Continuous Infusions:   LOS: 0 days     Time spent: 15 minutes  Greater than 50% of the time spent on counseling and coordinating the care.   Leisa Lenz, MD [facility] Pager [phone]  If 7PM-7AM, please contact night-coverage [email or web address] Password [facility] 03/30/2016, 11:49 AM

## 2016-04-02 ENCOUNTER — Ambulatory Visit (INDEPENDENT_AMBULATORY_CARE_PROVIDER_SITE_OTHER): Payer: Medicare Other | Admitting: Cardiology

## 2016-04-02 ENCOUNTER — Telehealth: Payer: Self-pay | Admitting: Pulmonary Disease

## 2016-04-02 ENCOUNTER — Encounter: Payer: Self-pay | Admitting: Cardiology

## 2016-04-02 VITALS — BP 138/86 | HR 73 | Ht 76.0 in | Wt 139.4 lb

## 2016-04-02 DIAGNOSIS — Z01818 Encounter for other preprocedural examination: Secondary | ICD-10-CM

## 2016-04-02 DIAGNOSIS — E782 Mixed hyperlipidemia: Secondary | ICD-10-CM

## 2016-04-02 DIAGNOSIS — I2510 Atherosclerotic heart disease of native coronary artery without angina pectoris: Secondary | ICD-10-CM

## 2016-04-02 DIAGNOSIS — R079 Chest pain, unspecified: Secondary | ICD-10-CM

## 2016-04-02 DIAGNOSIS — I255 Ischemic cardiomyopathy: Secondary | ICD-10-CM | POA: Diagnosis not present

## 2016-04-02 DIAGNOSIS — R55 Syncope and collapse: Secondary | ICD-10-CM | POA: Diagnosis not present

## 2016-04-02 MED ORDER — ROSUVASTATIN CALCIUM 40 MG PO TABS: 40.0000 mg | ORAL_TABLET | Freq: Every day | ORAL | 3 refills | Status: DC

## 2016-04-02 NOTE — Telephone Encounter (Signed)
Spoke with pt's sister, [name]. They are wanting to reschedule pt's bronch. Bronch is scheduled for 04/09/2016 at 8am at [facility].  BQ - please advise when this could be rescheduled based on your schedule. Thanks.

## 2016-04-02 NOTE — Patient Instructions (Addendum)
Medication Instructions:  START CRESTOR '40MG'$  DAILY STOP ASPIRIN TODAY   If you need a refill on your cardiac medications before your next appointment, please call your pharmacy.  Testing/Procedures: Your physician has requested that you have a lexiscan myoview. For further information please visit [email or web address]. Please follow instruction sheet, as given.   Thank you for choosing [facility] at [facility], [place], NP

## 2016-04-02 NOTE — Progress Notes (Signed)
Cardiology Office Note NEW PATIENT VISIT   Date:  04/02/2016   ID:  [name], DOB 1950/09/03, MRN [id number]  PCP:  [name], MD  Cardiologist:  Dr. Debara Pickett new    Chief Complaint  Patient presents with  . New Patient (Initial Visit)  . Shortness of Breath    occassioanally.  . Leg Pain    occassionally.      History of Present Illness: [name] is a 66 y.o. male who presents for post hospitalization for near syncope, with hx CAD with CABG X 5 in 2010 X 5 with LIMA to LAD, rVG to 1st diag, seq. r VG to 1st OM, and dLCX, rVG to PDA.   AA A- abdominal aortic aneurysm with bilateral common iliac artery aneurysms in 2013 with repair. Other hx of HTN, COPD and now being worked up for a lung mass.     ECHO in 2010 was 35-40% with grade 1 diastolic dysfunction, ongoing tobacco abuse. He presented to [facility] status post fall. Pt reported he felt dizzy and fell.  2 D ECHO with EF 30-35%, left ventricular diastolic function parameters were normal.Pulmonary arteries: PA peak pressure: 49 mm Hg (S).  - Trop WNL he djid have mass on CXR and CT with 3.3 x 4.5 x 4.7 cm spiculated cavitary mass left upper lobe consistent with neoplasm.  Moderate to severe emphysema noted.  Ascending thoracic aorta measures 4 cm diameter. Atherosclerotic calcification is noted in  the wall of the thoracic aorta.  He is for bronchoscopy on 04/09/16.  Today he does complain of chest pain that does not last long.  He may be short of breath when it occurs. And it has awakened from sleep  It occurs 2 X week.  His grafts are 69 years old and no recent follow up and given that EF is also lower will need nuc study prior to bronch though no cath unless high risk of ischemia.  He tells me ( his speech is hard to understand and he does talk to himself at time)  That his appetite is good.  He continues to smoke about a PK per day of cigarettes.  His sister tells me that he does not do as much around the house as he used  to.  He does walk a lot.    Past Medical History:  Diagnosis Date  . AAA (abdominal aortic aneurysm) ([place])   . Abdominal aortic aneurysm ([place])    AAA And bilateral common iliac artery aneurysms  . BPH (benign prostatic hyperplasia)   . CAD (coronary artery disease)    Dr Ron Parker @ [facility]   . Chronic mental illness 04/21/2011   Diagnosis unclear,  Family provides good care  . COPD (chronic obstructive pulmonary disease) ([place]) 04/21/2011   smoking  . Ejection fraction    EF 35-40%, echo, May, 2010  . Hyperlipidemia   . Hyperplastic colon polyp 04/21/2011  . Hypertension   . Preop cardiovascular exam    Cardiac clearance for major vascular surgery, May, 2013  . Pulmonary nodule    Chest CT May, 2013, 2 small pulmonary nodules, needs appropriate followup,  this CT was not ordered by the cardiology team  . PVD (peripheral vascular disease) ([place]) 04/21/2011  . Retention of urine   . Shortness of breath   . Tobacco abuse     Past Surgical History:  Procedure Laterality Date  . ABDOMINAL AORTA STENT  07/2011  . ABDOMINAL AORTAGRAM N/A 07/26/2011  Procedure: ABDOMINAL AORTAGRAM;  Surgeon: Conrad Vanceburg, MD;  Location: [facility] CATH LAB;  Service: Cardiovascular;  Laterality: N/A;  . ABDOMINAL AORTIC ANEURYSM REPAIR  07/2011  . CORONARY ARTERY BYPASS GRAFT  07/2008   CABG X5/notes 07/12/2010  . EMBOLIZATION Left 07/26/2011   Procedure: EMBOLIZATION;  Surgeon: Conrad Cave-In-Rock, MD;  Location: [facility] CATH LAB;  Service: Cardiovascular;  Laterality: Left;  . TRANSURETHRAL RESECTION OF PROSTATE  11/29/2011   Procedure: TRANSURETHRAL RESECTION OF THE PROSTATE WITH GYRUS INSTRUMENTS;  Surgeon: Malka So, MD;  Location: [facility] ORS;  Service: Urology;  Laterality: N/A;  Prostate Ultrasound, and Biopsy     No current outpatient prescriptions on file.   No current facility-administered medications for this visit.     Allergies:   Ace inhibitors    Social History:  The patient  reports that he has been smoking  Cigarettes.  He has a 50.00 pack-year smoking history. He has never used smokeless tobacco. He reports that he does not drink alcohol or use drugs.   Family History:  [name] family history includes Arthritis in his sister; Cancer in his father; Diabetes in his father; Heart attack (age of onset: 25) in his father; Heart disease in his father; Hyperlipidemia in his sister; Hypertension in his sister.    ROS:  General:no colds or fevers, no weight changes Skin:no rashes or ulcers HEENT:no blurred vision, no congestion CV:see HPI PUL:see HPI GI:no diarrhea constipation or melena, no indigestion GU:no hematuria, no dysuria MS:no joint pain, no claudication Neuro:recent near syncope, no lightheadedness Endo:no diabetes, no thyroid disease  Wt Readings from Last 3 Encounters:  04/02/16 139 lb 6.4 oz (63.2 kg)  03/28/16 129 lb 13.6 oz (58.9 kg)  05/28/14 142 lb (64.4 kg)     PHYSICAL EXAM: VS:  BP 138/86   Pulse 73   Ht '6\' 4"'$  (1.93 m)   Wt 139 lb 6.4 oz (63.2 kg)   BMI 16.97 kg/m  , BMI Body mass index is 16.97 kg/m. General:Pleasant affect, NAD Skin:Warm and dry, brisk capillary refill HEENT:normocephalic, sclera clear, mucus membranes moist Neck:supple, no JVD, no bruits  Heart:S1S2 RRR without murmur, gallup, rub or click Lungs:diminished in bases, without rales, rhonchi, or wheezes JGG:EZMO, non tender, + BS, do not palpate liver spleen or masses Ext:no lower ext edema, 2+ pedal pulses, 2+ radial pulses Neuro:alert and oriented X 3, MAE, follows commands, + facial symmetry    EKG:  EKG is NOT ordered today. The ekgs from the hospital were reviewed and no acute changes from 2013.    Recent Labs: 03/28/2016: ALT 16 03/29/2016: BUN 18; Creatinine, Ser 1.18; Hemoglobin 12.9; Platelets 210; Potassium 4.1; Sodium 138    Lipid Panel    Component Value Date/Time   CHOL 243 (H) 05/28/2014 1146   TRIG 88.0 05/28/2014 1146   HDL 69.40 05/28/2014 1146   CHOLHDL 4  05/28/2014 1146   VLDL 17.6 05/28/2014 1146   LDLCALC 156 (H) 05/28/2014 1146   LDLDIRECT 145.6 10/14/2012 1641       Other studies Reviewed: Additional studies/ records that were reviewed today include:  ECHO Study Conclusions  - Left ventricle: Diffuse hypokinesis worse in the inferior wall.   The cavity size was moderately dilated. Wall thickness was   normal. Systolic function was moderately to severely reduced. The   estimated ejection fraction was in the range of 30% to 35%. Left   ventricular diastolic function parameters were normal. - Atrial septum: No defect or patent foramen  ovale was identified. - Pulmonary arteries: PA peak pressure: 49 mm Hg (S).   CT CHEST WITHOUT CONTRAST  TECHNIQUE: Multidetector CT imaging of the chest was performed following the standard protocol without IV contrast.  COMPARISON:  Chest x-ray day 03/28/2016  FINDINGS: Cardiovascular: The heart size is normal. No pericardial effusion. Coronary artery calcification is noted. Ascending thoracic aorta measures 4 cm diameter. Atherosclerotic calcification is noted in the wall of the thoracic aorta.  Mediastinum/Nodes: No mediastinal lymphadenopathy. No evidence for gross hilar lymphadenopathy although assessment is limited by the lack of intravenous contrast on today's study. The esophagus has normal imaging features. There is no axillary lymphadenopathy.  Lungs/Pleura: 3.3 x 4.5 x 4.7 cm spiculated cavitary mass left upper lobe consistent with neoplasm.  Moderate to severe emphysema noted.  Upper Abdomen: Aortic endograft incompletely visualized.  Musculoskeletal: Bone windows reveal no worrisome lytic or sclerotic osseous lesions.  IMPRESSION: 4.7 cm spiculated cavitary mass left upper lobe consistent with neoplasm.  ASSESSMENT AND PLAN:  1.  CAD with new lung mass and bronch.  Will do lexiscan myoview to clear for surgery, if high risk would discuss cardiac cath. As  pt does have chest pain, discussed with Dr. Debara Pickett DOD and now pt's cardiologist.    2. Lung mass see above.  3. ICM with decrease in EF will eval for ischemia - currently off BB, with lung issues, would like to resume if pulmonary agrees.  ASA on hold for bronch.  euvolemic today.   4.  Hyperlipidemia with CAD and goal LDL < 70 crestor 40 called in to  Pharmacy, will need lipid eval in future.   He will follow up with Dr. Debara Pickett after lexiscan.     Current medicines are reviewed with the patient today.  The patient Has no concerns regarding medicines.  The following changes have been made:  See above Labs/ tests ordered today include:see above  Disposition:   FU:  see above  Signed, Cecilie Kicks, NP  04/02/2016 11:07 AM    [address], [address] [place], [place] Phone: [phone]; Fax: [phone]

## 2016-04-03 ENCOUNTER — Telehealth (HOSPITAL_COMMUNITY): Payer: Self-pay

## 2016-04-03 LAB — QUANTIFERON IN TUBE
QFT TB AG MINUS NIL VALUE: 0 IU/mL
QUANTIFERON MITOGEN VALUE: >10 IU/mL
QUANTIFERON NIL VALUE: 0.02 [IU]/mL
QUANTIFERON TB AG VALUE: 0.02 [IU]/mL
QUANTIFERON TB GOLD: NEGATIVE

## 2016-04-03 LAB — QUANTIFERON TB GOLD ASSAY (BLOOD)

## 2016-04-03 NOTE — Telephone Encounter (Signed)
Encounter complete. 

## 2016-04-04 ENCOUNTER — Encounter (HOSPITAL_COMMUNITY): Payer: Medicare Other

## 2016-04-04 NOTE — Telephone Encounter (Signed)
I spoke with Baxter Flattery in Respiratory and this bronch has already been canceled per the pt's family member.  BQ - please advise when this could be rescheduled. Thanks!

## 2016-04-05 ENCOUNTER — Ambulatory Visit (HOSPITAL_COMMUNITY)
Admission: RE | Admit: 2016-04-05 | Discharge: 2016-04-05 | Disposition: A | Discharge status: Home / Self Care | Payer: Medicare Other | Source: Ambulatory Visit | Attending: Cardiovascular Disease | Admitting: Cardiovascular Disease

## 2016-04-05 DIAGNOSIS — I2510 Atherosclerotic heart disease of native coronary artery without angina pectoris: Secondary | ICD-10-CM | POA: Diagnosis not present

## 2016-04-05 DIAGNOSIS — J449 Chronic obstructive pulmonary disease, unspecified: Secondary | ICD-10-CM | POA: Diagnosis not present

## 2016-04-05 DIAGNOSIS — I739 Peripheral vascular disease, unspecified: Secondary | ICD-10-CM | POA: Diagnosis not present

## 2016-04-05 DIAGNOSIS — I119 Hypertensive heart disease without heart failure: Secondary | ICD-10-CM | POA: Insufficient documentation

## 2016-04-05 DIAGNOSIS — Z8249 Family history of ischemic heart disease and other diseases of the circulatory system: Secondary | ICD-10-CM | POA: Insufficient documentation

## 2016-04-05 DIAGNOSIS — R0602 Shortness of breath: Secondary | ICD-10-CM | POA: Insufficient documentation

## 2016-04-05 DIAGNOSIS — Z01818 Encounter for other preprocedural examination: Secondary | ICD-10-CM | POA: Diagnosis not present

## 2016-04-05 DIAGNOSIS — R079 Chest pain, unspecified: Secondary | ICD-10-CM | POA: Diagnosis not present

## 2016-04-05 DIAGNOSIS — R9439 Abnormal result of other cardiovascular function study: Secondary | ICD-10-CM | POA: Insufficient documentation

## 2016-04-05 DIAGNOSIS — I714 Abdominal aortic aneurysm, without rupture: Secondary | ICD-10-CM | POA: Diagnosis not present

## 2016-04-05 DIAGNOSIS — Z951 Presence of aortocoronary bypass graft: Secondary | ICD-10-CM | POA: Insufficient documentation

## 2016-04-05 DIAGNOSIS — R55 Syncope and collapse: Secondary | ICD-10-CM | POA: Diagnosis not present

## 2016-04-05 DIAGNOSIS — I255 Ischemic cardiomyopathy: Secondary | ICD-10-CM | POA: Diagnosis not present

## 2016-04-05 DIAGNOSIS — Z720 Tobacco use: Secondary | ICD-10-CM | POA: Insufficient documentation

## 2016-04-05 LAB — MYOCARDIAL PERFUSION IMAGING
CHL CUP NUCLEAR SDS: 3
CHL CUP NUCLEAR SRS: 5
CSEPPHR: 102 {beats}/min
LV dias vol: 155 mL (ref 62–150)
LV sys vol: 89 mL
Rest HR: 55 {beats}/min
SSS: 8
TID: 0.9

## 2016-04-05 MED ORDER — AMINOPHYLLINE 25 MG/ML IV SOLN
75.0000 mg | Freq: Once | INTRAVENOUS | Status: AC
  Administered 2016-04-05: 75 mg via INTRAVENOUS

## 2016-04-05 MED ORDER — TECHNETIUM TC 99M TETROFOSMIN IV KIT
10.1000 | PACK | Freq: Once | INTRAVENOUS | Status: AC | PRN
  Administered 2016-04-05: 10.1 via INTRAVENOUS
  Filled 2016-04-05: qty 11

## 2016-04-05 MED ORDER — REGADENOSON 0.4 MG/5ML IV SOLN
0.4000 mg | Freq: Once | INTRAVENOUS | Status: AC
  Administered 2016-04-05: 0.4 mg via INTRAVENOUS

## 2016-04-05 MED ORDER — TECHNETIUM TC 99M TETROFOSMIN IV KIT
1445.0000 | PACK | Freq: Once | INTRAVENOUS | Status: DC | PRN
  Filled 2016-04-05: qty 1445

## 2016-04-05 MED ORDER — TECHNETIUM TC 99M TETROFOSMIN IV KIT
29.4000 | PACK | Freq: Once | INTRAVENOUS | Status: AC | PRN
  Administered 2016-04-05: 29.4 via INTRAVENOUS
  Filled 2016-04-05: qty 30

## 2016-04-06 NOTE — Telephone Encounter (Signed)
lmomtcb x 1 for Respiratory to call back to schedule this appt.

## 2016-04-06 NOTE — Telephone Encounter (Signed)
Tuesday morning before 9, Wednesday morning no later than 8

## 2016-04-09 ENCOUNTER — Encounter (HOSPITAL_COMMUNITY): Payer: Medicare Other

## 2016-04-09 ENCOUNTER — Encounter (HOSPITAL_COMMUNITY): Admission: RE | Payer: Self-pay | Source: Ambulatory Visit

## 2016-04-09 ENCOUNTER — Ambulatory Visit (HOSPITAL_COMMUNITY): Admission: RE | Admit: 2016-04-09 | Payer: Medicare Other | Source: Ambulatory Visit | Admitting: Pulmonary Disease

## 2016-04-09 SURGERY — BRONCHOSCOPY, WITH FLUOROSCOPY
Anesthesia: Moderate Sedation | Laterality: Bilateral

## 2016-04-09 NOTE — Telephone Encounter (Signed)
That works!

## 2016-04-09 NOTE — Telephone Encounter (Signed)
[name] with Respiratory said can reschedule bronch for the patient the 30th or 31st but will have to be in the afternoon.  CB is [phone].

## 2016-04-09 NOTE — Telephone Encounter (Signed)
Called Tara in Respiratory. Pt has been scheduled for bronch on 04/12/16 at 8am. Pt's sister is aware of this information. Nothing further was needed.

## 2016-04-09 NOTE — Telephone Encounter (Signed)
The pt hasn't been contacted yet. I spoke with Baxter Flattery in Respiratory, they can do Thursday at [place] at [facility].  BQ - would this work for you? Thanks.

## 2016-04-09 NOTE — Telephone Encounter (Signed)
Spoke with Baxter Flattery in Respiratory. Neither days or times will work for their department. She states that Thursday and Friday are the best days this week.  BQ - please advise. Thanks.

## 2016-04-09 NOTE — Telephone Encounter (Signed)
Is he only available in the afternoons?  Mornings really best for me this week

## 2016-04-12 ENCOUNTER — Ambulatory Visit (HOSPITAL_COMMUNITY)
Admission: RE | Admit: 2016-04-12 | Discharge: 2016-04-12 | Disposition: A | Discharge status: Home / Self Care | Payer: Medicare Other | Source: Ambulatory Visit | Attending: Pulmonary Disease | Admitting: Pulmonary Disease

## 2016-04-12 ENCOUNTER — Encounter (HOSPITAL_COMMUNITY)
Admission: RE | Disposition: A | Discharge status: Home / Self Care | Payer: Self-pay | Source: Ambulatory Visit | Attending: Pulmonary Disease

## 2016-04-12 ENCOUNTER — Encounter (HOSPITAL_COMMUNITY): Payer: Self-pay | Admitting: Respiratory Therapy

## 2016-04-12 ENCOUNTER — Ambulatory Visit (HOSPITAL_COMMUNITY): Payer: Medicare Other

## 2016-04-12 ENCOUNTER — Observation Stay (HOSPITAL_COMMUNITY)
Admission: RE | Admit: 2016-04-12 | Discharge: 2016-04-12 | Disposition: A | Discharge status: Home / Self Care | Payer: Medicare Other | Source: Ambulatory Visit | Attending: Pulmonary Disease | Admitting: Pulmonary Disease

## 2016-04-12 DIAGNOSIS — C3412 Malignant neoplasm of upper lobe, left bronchus or lung: Secondary | ICD-10-CM | POA: Insufficient documentation

## 2016-04-12 DIAGNOSIS — I2510 Atherosclerotic heart disease of native coronary artery without angina pectoris: Secondary | ICD-10-CM | POA: Diagnosis not present

## 2016-04-12 DIAGNOSIS — E785 Hyperlipidemia, unspecified: Secondary | ICD-10-CM | POA: Diagnosis not present

## 2016-04-12 DIAGNOSIS — Z7982 Long term (current) use of aspirin: Secondary | ICD-10-CM | POA: Insufficient documentation

## 2016-04-12 DIAGNOSIS — F17210 Nicotine dependence, cigarettes, uncomplicated: Secondary | ICD-10-CM | POA: Insufficient documentation

## 2016-04-12 DIAGNOSIS — Z9889 Other specified postprocedural states: Secondary | ICD-10-CM

## 2016-04-12 DIAGNOSIS — R918 Other nonspecific abnormal finding of lung field: Secondary | ICD-10-CM | POA: Diagnosis not present

## 2016-04-12 HISTORY — PX: VIDEO BRONCHOSCOPY: SHX5072

## 2016-04-12 SURGERY — BRONCHOSCOPY, WITH FLUOROSCOPY
Anesthesia: Moderate Sedation

## 2016-04-12 SURGERY — BRONCHOSCOPY, WITH FLUOROSCOPY
Anesthesia: Moderate Sedation | Laterality: Bilateral

## 2016-04-12 MED ORDER — FENTANYL CITRATE (PF) 100 MCG/2ML IJ SOLN
INTRAMUSCULAR | Status: DC | PRN
  Administered 2016-04-12: 25 ug via INTRAVENOUS
  Administered 2016-04-12: 50 ug via INTRAVENOUS
  Administered 2016-04-12: 25 ug via INTRAVENOUS

## 2016-04-12 MED ORDER — MIDAZOLAM HCL 10 MG/2ML IJ SOLN
INTRAMUSCULAR | Status: DC | PRN
  Administered 2016-04-12: 2 mg via INTRAVENOUS
  Administered 2016-04-12 (×2): 1 mg via INTRAVENOUS

## 2016-04-12 MED ORDER — SODIUM CHLORIDE 0.9 % IV SOLN
Freq: Once | INTRAVENOUS | Status: AC
  Administered 2016-04-12: 08:00:00 via INTRAVENOUS

## 2016-04-12 MED ORDER — PHENYLEPHRINE HCL 0.25 % NA SOLN
NASAL | Status: DC | PRN
  Administered 2016-04-12: 2 via NASAL

## 2016-04-12 MED ORDER — MIDAZOLAM HCL 5 MG/ML IJ SOLN
INTRAMUSCULAR | Status: AC
  Filled 2016-04-12: qty 2

## 2016-04-12 MED ORDER — BUTAMBEN-TETRACAINE-BENZOCAINE 2-2-14 % EX AERO: 1.0000 | INHALATION_SPRAY | Freq: Once | CUTANEOUS | Status: DC

## 2016-04-12 MED ORDER — PHENYLEPHRINE HCL 0.25 % NA SOLN
1.0000 | Freq: Four times a day (QID) | NASAL | Status: DC | PRN
  Filled 2016-04-12: qty 15

## 2016-04-12 MED ORDER — LIDOCAINE HCL 2 % EX GEL
CUTANEOUS | Status: DC | PRN
  Administered 2016-04-12: 1

## 2016-04-12 MED ORDER — FENTANYL CITRATE (PF) 100 MCG/2ML IJ SOLN
INTRAMUSCULAR | Status: AC
  Filled 2016-04-12: qty 4

## 2016-04-12 MED ORDER — LIDOCAINE HCL (PF) 1 % IJ SOLN
INTRAMUSCULAR | Status: DC | PRN
  Administered 2016-04-12: 6 mL

## 2016-04-12 MED ORDER — LIDOCAINE HCL 2 % EX GEL: 1.0000 "application " | Freq: Once | CUTANEOUS | Status: DC

## 2016-04-12 NOTE — Discharge Instructions (Signed)
Flexible Bronchoscopy, Care After These instructions give you information on caring for yourself after your procedure. Your doctor may also give you more specific instructions. Call your doctor if you have any problems or questions after your procedure. Follow these instructions at home:  Do not eat or drink anything for 2 hours after your procedure. If you try to eat or drink before the medicine wears off, food or drink could go into your lungs. You could also burn yourself.  After 2 hours have passed and when you can cough and gag normally, you may eat soft food and drink liquids slowly.  The day after the test, you may eat your normal diet.  You may do your normal activities.  Keep all doctor visits. Get help right away if:  You get more and more short of breath.  You get light-headed.  You feel like you are going to pass out (faint).  You have chest pain.  You have new problems that worry you.  You cough up more than a little blood.  You cough up more blood than before.    Do not eat or drink anything until 10:30AM on 04/12/2016. This information is not intended to replace advice given to you by your health care provider. Make sure you discuss any questions you have with your health care provider. Document Released: 12/24/2008 Document Revised: 08/04/2015 Document Reviewed: 10/31/2012 Elsevier Interactive Patient Education  2017 [employer].

## 2016-04-12 NOTE — Op Note (Signed)
[facility] Cardiopulmonary Patient Name: [name] Date: 04/12/2016 MRN: [id number] Attending MD: Juanito Doom , MD Date of Birth: 1951-02-22 CSN: Finalized Age: 66 Admit Type: Outpatient Gender: Male Procedure:            Bronchoscopy Indications:          Left upper lobe mass Providers:            Nathaneil Canary B. Deklyn Trachtenberg, MD, Ashley Mariner RRT,RCP, Phillis Knack RRT, RCP Referring MD:          Medicines:            Midazolam 4 mg IV, Fentanyl 100 mcg IV, Lidocaine 2%                        applied to cords 6 mL, Lidocaine 1% subglottic space 6                        mL Complications:        No immediate complications Estimated Blood Loss: Estimated blood loss: none. Procedure:            Pre-Anesthesia Assessment:                       - A History and Physical has been performed. Patient                        meds and allergies have been reviewed. The risks and                        benefits of the procedure and the sedation options and                        risks were discussed with the patient. All questions                        were answered and informed consent was obtained.                        Patient identification and proposed procedure were                        verified prior to the procedure by the physician and                        the technician in the procedure room. Mental Status                        Examination: normal. Airway Examination: normal                        oropharyngeal airway. Respiratory Examination: clear to                        auscultation. CV Examination: normal. ASA Grade                        Assessment: II - A patient with mild systemic disease.  After reviewing the risks and benefits, the patient was                        deemed in satisfactory condition to undergo the                        procedure. The anesthesia plan was to use moderate           sedation / analgesia (conscious sedation). Immediately                        prior to administration of medications, the patient was                        re-assessed for adequacy to receive sedatives. The                        heart rate, respiratory rate, oxygen saturations, blood                        pressure, adequacy of pulmonary ventilation, and                        response to care were monitored throughout the                        procedure. The physical status of the patient was                        re-assessed after the procedure.                       After obtaining informed consent, the bronchoscope was                        passed under direct vision. Throughout the procedure,                        the patient's blood pressure, pulse, and oxygen                        saturations were monitored continuously. the [id number]                        [id number] scope was introduced through the right nostril                        and advanced to the tracheobronchial tree. The                        procedure was accomplished without difficulty. The                        patient tolerated the procedure well. The total                        duration of the procedure was 18 minutes. Scope In: 8:15:33 AM Scope Out: 8:37:24 AM Findings:      The nasopharynx/oropharynx appears normal. The larynx appears normal.       The vocal cords appear normal. The subglottic space is normal.  The       trachea is of normal caliber. The carina is sharp. The tracheobronchial       tree was examined to at least the first subsegmental level. Bronchial       mucosa and anatomy are normal; there are no endobronchial lesions, and       no secretions.      Bronchoalveolar lavage was performed in the LUL apical posterior       segments (B1 & B2) of the lung and sent for routine cytology and       bacterial, AFB and fungal analysis. 60 mL of fluid were instilled. 30 mL       were returned.  The return was blood-tinged. There were no mucoid plugs       in the return fluid.      Fluoroscopically guided transbronchial brushings of a mass were obtained       in the apical-posterior segment of the left upper lobe and sent for       routine cytology. Two samples were obtained.      Transbronchial biopsies of a mass were performed in the apical-posterior       segment of the left upper lobe using forceps and sent for histopathology       examination. The procedure was guided by fluoroscopy. Transbronchial       biopsy technique was selected because the sampling site was not visible       endoscopically. Nine biopsy passes were performed. Eight biopsy samples       were obtained. Impression:           - Left upper lobe mass                       - The examination was normal.                       - Bronchoalveolar lavage was performed.                       - Fluoroscopically guided transbronchial brushings were                        obtained.                       - Transbronchial lung biopsies were performed. Moderate Sedation:      Moderate (conscious) sedation was personally administered by the       endoscopist. The following parameters were monitored: oxygen saturation,       heart rate, blood pressure, and response to care. Total physician       intraservice time was 24 minutes. Recommendation:       - Await BAL, biopsy, brushing, culture and cytology                        results. Procedure Code(s):    --- Professional ---                       [phone], Bronchoscopy, rigid or flexible, including                        fluoroscopic guidance, when performed; with  transbronchial lung biopsy(s), single lobe                       31624, Bronchoscopy, rigid or flexible, including                        fluoroscopic guidance, when performed; with bronchial                        alveolar lavage                       [phone], Bronchoscopy, rigid or flexible,  including                        fluoroscopic guidance, when performed; with brushing or                        protected brushings                       [id number], Moderate sedation services provided by the same                        physician or other qualified health care professional                        performing the diagnostic or therapeutic service that                        the sedation supports, requiring the presence of an                        independent trained observer to assist in the                        monitoring of the patient's level of consciousness and                        physiological status; initial 15 minutes of                        intraservice time, patient age 98 years or older                       [phone], Moderate sedation services; each additional 15                        minutes intraservice time Diagnosis Code(s):    --- Professional ---                       R91.8, Other nonspecific abnormal finding of lung field CPT copyright 2016 American Medical Association. All rights reserved. The codes documented in this report are preliminary and upon coder review may  be revised to meet current compliance requirements. Norlene Campbell, MD Juanito Doom, MD 04/12/2016 8:44:09 AM This report has been signed electronically. Number of Addenda: 0

## 2016-04-12 NOTE — H&P (Signed)
[facility] PCCM  HPI: [name] was admitted recently and noted to have a left upper lobe mass.  He is here today for a bronchsocopy.  See my consult note for further details. He feels well today.  Past Medical History:  Diagnosis Date  . AAA (abdominal aortic aneurysm) ([place])   . Abdominal aortic aneurysm ([place])    AAA And bilateral common iliac artery aneurysms  . BPH (benign prostatic hyperplasia)   . CAD (coronary artery disease)    Dr Ron Parker @ [facility]   . Chronic mental illness 04/21/2011   Diagnosis unclear,  Family provides good care  . COPD (chronic obstructive pulmonary disease) ([place]) 04/21/2011   smoking  . Ejection fraction    EF 35-40%, echo, May, 2010  . Hyperlipidemia   . Hyperplastic colon polyp 04/21/2011  . Hypertension   . Preop cardiovascular exam    Cardiac clearance for major vascular surgery, May, 2013  . Pulmonary nodule    Chest CT May, 2013, 2 small pulmonary nodules, needs appropriate followup,  this CT was not ordered by the cardiology team  . PVD (peripheral vascular disease) ([place]) 04/21/2011  . Retention of urine   . Shortness of breath   . Tobacco abuse      Family History  Problem Relation Age of Onset  . Cancer Father     prostate  . Heart disease Father   . Diabetes Father   . Heart attack Father 42  . Arthritis Sister   . Hyperlipidemia Sister   . Hypertension Sister   . Anesthesia problems Neg Hx      Social History   Social History  . Marital status: Single    Spouse name: N/A  . Number of children: N/A  . Years of education: 14   Occupational History  . retired    Social History Main Topics  . Smoking status: Current Every Day Smoker    Packs/day: 1.00    Years: 50.00    Types: Cigarettes  . Smokeless tobacco: Never Used  . Alcohol use No  . Drug use: No  . Sexual activity: No   Other Topics Concern  . Not on file   Social History Narrative  . No narrative on file     Allergies  Allergen Reactions  . Ace Inhibitors Itching     '@encmedstart'$ @ Vitals:   04/12/16 0745 04/12/16 0750 04/12/16 0755 04/12/16 0800  BP: (!) 154/102 (!) 158/103 (!) 147/99 (!) 151/107  Pulse: (!) 58 (!) 59 (!) 57 60  Resp: '16 15 20 14  '$ Temp:      TempSrc:      SpO2: 100% 100% 100% 98%  Weight:      Height:       On exam Resting comfortably Respirations even, non-labored Ext perfused Pulses intact, RRR Belly soft, no masses Awake, alert, oriented, maew  CBC    Component Value Date/Time   WBC 5.8 03/29/2016 0904   RBC 4.35 03/29/2016 0904   HGB 12.9 (L) 03/29/2016 0904   HCT 40.2 03/29/2016 0904   PLT 210 03/29/2016 0904   MCV 92.4 03/29/2016 0904   MCH 29.7 03/29/2016 0904   MCHC 32.1 03/29/2016 0904   RDW 14.7 03/29/2016 0904   LYMPHSABS 1.2 03/28/2016 1526   MONOABS 0.5 03/28/2016 1526   EOSABS 0.0 03/28/2016 1526   BASOSABS 0.0 03/28/2016 1526   CT chest images reviewed again:    Impression/plan Left upper lung mass with tobacco abuse history: worrisome for malignancy, plan  bronchoscopy with fluoro.  Roselie Awkward, MD [place] PCCM Pager: [phone] Cell: [phone] After 3pm or if no response, call [phone]

## 2016-04-12 NOTE — Progress Notes (Signed)
Video bronchoscopy performed.  Intervention bronchial biopsy. Intervention bronchial washing. Intervention bronchial brushing.    No complications noted.  Will continue to monitor.

## 2016-04-13 ENCOUNTER — Encounter (HOSPITAL_COMMUNITY): Payer: Self-pay | Admitting: Pulmonary Disease

## 2016-04-13 ENCOUNTER — Encounter: Payer: Self-pay | Admitting: Pulmonary Disease

## 2016-04-13 ENCOUNTER — Telehealth: Payer: Self-pay | Admitting: Pulmonary Disease

## 2016-04-13 DIAGNOSIS — C3490 Malignant neoplasm of unspecified part of unspecified bronchus or lung: Secondary | ICD-10-CM | POA: Insufficient documentation

## 2016-04-13 LAB — ACID FAST SMEAR (AFB, MYCOBACTERIA)

## 2016-04-13 LAB — ACID FAST SMEAR (AFB): ACID FAST SMEAR - AFSCU2: NEGATIVE

## 2016-04-13 NOTE — Telephone Encounter (Signed)
I tried to called [name], Mr. Venus's sister to let her know that he has lung cancer based on yesterday's bronchoscopy.  Unfortunately she did not answer both numbers.  I have left a message to let her know she needs to call back, but I didn't say in the message that he has cancer.

## 2016-04-13 NOTE — Telephone Encounter (Signed)
Patient's sister, [name], returning Dr. Anastasia Pall call. Her CB is [phone].  She will stay by her phone.

## 2016-04-13 NOTE — Telephone Encounter (Signed)
Spoke with pt's sister, [name]. Advised her that BQ should be the one to give her these results. I did not release any of these results to [place].  BQ - please call the pt's sister when you can. Thanks.

## 2016-04-13 NOTE — Telephone Encounter (Signed)
Dr. Lake Bells has the afternoon off, pt's sister is awaiting a call to discuss results.   MW please advise if you would be willing to contact pt's sister to discuss results, or will this need to wait until Monday. Thanks.

## 2016-04-13 NOTE — Telephone Encounter (Signed)
There is no urgency here and there are a lot of issues related to his diagnosis and treatment options that I do not feel comfortable weighing in on since I have not seen this pt so get some times on Monday to give Dr Lake Bells windows when he can call and go over all the studies that are back by then

## 2016-04-14 LAB — CULTURE, RESPIRATORY W GRAM STAIN
Culture: NORMAL
Special Requests: NORMAL

## 2016-04-14 LAB — CULTURE, RESPIRATORY

## 2016-04-16 ENCOUNTER — Other Ambulatory Visit: Payer: Self-pay | Admitting: Pulmonary Disease

## 2016-04-16 DIAGNOSIS — C3492 Malignant neoplasm of unspecified part of left bronchus or lung: Secondary | ICD-10-CM

## 2016-04-16 NOTE — Telephone Encounter (Signed)
I called [name] and was able to reach her and let her know that her brother's biopsy results showed lung cancer.  I explained he will need more tests and an office visit with the oncology department after the next [facility] meeting.  She voiced understanding.

## 2016-04-16 NOTE — Telephone Encounter (Signed)
I reviewed referral. Please order PET scan and I will send this to Dr. Everrett Coombe office.  He did CABG on her in the past.

## 2016-04-16 NOTE — Telephone Encounter (Signed)
Pt sister [name] returning call.Hillery Hunter

## 2016-04-17 ENCOUNTER — Encounter: Payer: Medicare Other | Admitting: Internal Medicine

## 2016-04-17 ENCOUNTER — Institutional Professional Consult (permissible substitution) (INDEPENDENT_AMBULATORY_CARE_PROVIDER_SITE_OTHER): Payer: Medicare Other | Admitting: Cardiothoracic Surgery

## 2016-04-17 ENCOUNTER — Other Ambulatory Visit: Payer: Self-pay | Admitting: *Deleted

## 2016-04-17 ENCOUNTER — Encounter: Payer: Self-pay | Admitting: Cardiothoracic Surgery

## 2016-04-17 VITALS — BP 160/88 | HR 57 | Resp 16 | Ht 76.0 in | Wt 144.0 lb

## 2016-04-17 DIAGNOSIS — R918 Other nonspecific abnormal finding of lung field: Secondary | ICD-10-CM

## 2016-04-17 DIAGNOSIS — I255 Ischemic cardiomyopathy: Secondary | ICD-10-CM | POA: Diagnosis not present

## 2016-04-17 DIAGNOSIS — C3412 Malignant neoplasm of upper lobe, left bronchus or lung: Secondary | ICD-10-CM

## 2016-04-17 NOTE — Patient Instructions (Signed)
Lung Cancer Lung cancer occurs when abnormal cells in the lung grow out of control and form a mass (tumor). There are several types of lung cancer. The two most common types are:  Non-small cell. In this type of lung cancer, abnormal cells are larger and grow more slowly than those of small cell lung cancer.  Small cell. In this type of lung cancer, abnormal cells are smaller than those of non-small cell lung cancer. Small cell lung cancer gets worse faster than non-small cell lung cancer.  What are the causes? The leading cause of lung cancer is smoking tobacco. The second leading cause is radon exposure. What increases the risk?  Smoking tobacco.  Exposure to secondhand tobacco smoke.  Exposure to radon gas.  Exposure to asbestos.  Exposure to arsenic in drinking water.  Air pollution.  Family or personal history of lung cancer.  Lung radiation therapy.  Being older than 65 years. What are the signs or symptoms? In the early stages, symptoms may not be present. As the cancer progresses, symptoms may include:  A lasting cough, possibly with blood.  Fatigue.  Unexplained weight loss.  Shortness of breath.  Wheezing.  Chest pain.  Loss of appetite.  Symptoms of advanced lung cancer include:  Hoarseness.  Bone or joint pain.  Weakness.  Nail problems.  Face or arm swelling.  Paralysis of the face.  Drooping eyelids.  How is this diagnosed? Lung cancer can be identified with a physical exam and with tests such as:  A chest X-ray.  A CT scan.  Blood tests.  A biopsy.  After a diagnosis is made, you will have more tests to determine the stage of the cancer. The stages of non-small cell lung cancer are:  Stage 0, also called carcinoma in situ. At this stage, abnormal cells are found in the inner lining of your lung or lungs.  Stage I. At this stage, abnormal cells have grown into a tumor that is no larger than 5 cm across. The cancer has entered  the deeper lung tissue but has not yet entered the lymph nodes or other parts of the body.  Stage II. At this stage, the tumor is 7 cm across or smaller and has entered nearby lymph nodes. Or, the tumor is 5 cm across or smaller and has invaded surrounding tissue but is not found in nearby lymph nodes. There may be more than one tumor present.  Stage III. At this stage, the tumor may be any size. There may be more than one tumor in the lungs. The cancer cells have spread to the lymph nodes and possibly to other organs.  Stage IV. At this stage, there are tumors in both lungs and the cancer has spread to other areas of the body.  The stages of small cell lung cancer are:  Limited. At this stage, the cancer is found only on one side of the chest.  Extensive. At this stage, the cancer is in the lungs and in tissues on the other side of the chest. The cancer has spread to other organs or is found in the fluid between the layers of your lungs.  How is this treated? Depending on the type and stage of your lung cancer, you may be treated with:  Surgery. This is done to remove a tumor.  Radiation therapy. This treatment destroys cancer cells using X-rays or other types of radiation.  Chemotherapy. This treatment uses medicines to destroy cancer cells.  Targeted therapy. This treatment   aims to destroy only cancer cells instead of all cells as other therapies do.  You may also have a combination of treatments. Follow these instructions at home:  Do not use any tobacco products. This includes cigarettes, chewing tobacco, and electronic cigarettes. If you need help quitting, ask your health care provider.  Take medicines only as directed by your health care provider.  Eat a healthy diet. Work with a dietitian to make sure you are getting the nutrition you need.  Consider joining a support group or seeking counseling to help you cope with the stress of having lung cancer.  Let your cancer  specialist (oncologist) know if you are admitted to the hospital.  Keep all follow-up visits as directed by your health care provider. This is important. Contact a health care provider if:  You lose weight without trying.  You have a persistent cough and wheezing.  You feel short of breath.  You tire easily.  You experience bone or joint pain.  You have difficulty swallowing.  You feel hoarse or notice your voice changing.  Your pain medicine is not helping. Get help right away if:  You cough up blood.  You have new breathing problems.  You develop chest pain.  You develop swelling in: ? One or both ankles or legs. ? Your face, neck, or arms.  You are confused.  You experience paralysis in your face or a drooping eyelid. This information is not intended to replace advice given to you by your health care provider. Make sure you discuss any questions you have with your health care provider. Document Released: 06/04/2000 Document Revised: 08/04/2015 Document Reviewed: 07/02/2013 Elsevier Interactive Patient Education  2017 Elsevier Inc.  

## 2016-04-17 NOTE — Telephone Encounter (Signed)
Patient is seeing Dr. Servando Snare today.

## 2016-04-17 NOTE — Progress Notes (Signed)
[address]       [place] [address]             [phone]                    [name] [facility] Medical Record #[id number] Date of Birth: 08/23/1950  Referring: Juanito Doom, MD Primary Care: [name], MD  Chief Complaint:    Chief Complaint  Patient presents with  . Lung Cancer    LUL per BX..CT CHEST 03/28/16, CT A/P 03/29/16    History of Present Illness:    [name] 66 y.o. male is seen in the office  today for Recent diagnosis of squamous cell carcinoma of the left upper lobe . The patient is well-known to me having had coronary artery bypass grafting 5 done after a lateral wall myocardial infarction in 2010. The patient recently had a syncopal episode at home, and evaluation of this he was taken to the emergency room by EMS. Chest x-ray showed left upper lobe lung mass, confirmed on CT scan. Previous x-rays in 2010 2013 showed no evidence of lung mass. Unfortunately the patient continues to smoke at least one pack per day, has significant underlying pulmonary disease, full PFTs are pending. Prior to bronchoscopy echocardiogram and stress test were performed.  The patient does walk frequently, but fatigues and gets short of breath if he walks on any incline.   Current Activity/ Functional Status:  Patient is independent with mobility/ambulation, transfers, ADL's, IADL's.   Zubrod Score: At the time of surgery this patient's most appropriate activity status/level should be described as: '[]'$     0    Normal activity, no symptoms '[x]'$     1    Restricted in physical strenuous activity but ambulatory, able to do out light work '[]'$     2    Ambulatory and capable of self care, unable to do work activities, up and about               >50 % of waking hours                              '[]'$     3    Only limited self care, in bed greater than 50% of waking hours '[]'$     4    Completely disabled, no self care, confined to bed or chair '[]'$     5     Moribund   Past Medical History:  Diagnosis Date  . AAA (abdominal aortic aneurysm) ([place])   . Abdominal aortic aneurysm ([place])    AAA And bilateral common iliac artery aneurysms  . BPH (benign prostatic hyperplasia)   . CAD (coronary artery disease)    Dr Ron Parker @ [facility]   . Chronic mental illness 04/21/2011   Diagnosis unclear,  Family provides good care  . COPD (chronic obstructive pulmonary disease) ([place]) 04/21/2011   smoking  . Ejection fraction    EF 35-40%, echo, May, 2010  . Hyperlipidemia   . Hyperplastic colon polyp 04/21/2011  . Hypertension   . Preop cardiovascular exam    Cardiac clearance for major vascular surgery, May, 2013  . Pulmonary nodule    Chest CT May, 2013, 2 small pulmonary nodules, needs appropriate followup,  this CT was not ordered by the cardiology team  . PVD (peripheral vascular disease) ([place]) 04/21/2011  . Retention of urine   .  Shortness of breath   . Tobacco abuse     Past Surgical History:  Procedure Laterality Date  . ABDOMINAL AORTA STENT  07/2011  . ABDOMINAL AORTAGRAM N/A 07/26/2011   Procedure: ABDOMINAL Maxcine Ham;  Surgeon: Conrad Madison Park, MD;  Location: [facility] CATH LAB;  Service: Cardiovascular;  Laterality: N/A;  . ABDOMINAL AORTIC ANEURYSM REPAIR  07/2011  . CORONARY ARTERY BYPASS GRAFT  07/2008   CABG X5/notes 07/12/2010  . EMBOLIZATION Left 07/26/2011   Procedure: EMBOLIZATION;  Surgeon: Conrad McKeesport, MD;  Location: [facility] CATH LAB;  Service: Cardiovascular;  Laterality: Left;  . TRANSURETHRAL RESECTION OF PROSTATE  11/29/2011   Procedure: TRANSURETHRAL RESECTION OF THE PROSTATE WITH GYRUS INSTRUMENTS;  Surgeon: Malka So, MD;  Location: [facility] ORS;  Service: Urology;  Laterality: N/A;  Prostate Ultrasound, and Biopsy  . VIDEO BRONCHOSCOPY Bilateral 04/12/2016   Procedure: VIDEO BRONCHOSCOPY WITH FLUORO;  Surgeon: Juanito Doom, MD;  Location: [facility];  Service: Cardiopulmonary;  Laterality: Bilateral;    Family History  Problem Relation Age of  Onset  . Cancer Father     prostate  . Heart disease Father   . Diabetes Father   . Heart attack Father 64  . Arthritis Sister   . Hyperlipidemia Sister   . Hypertension Sister   . Anesthesia problems Neg Hx     Social History   Social History  . Marital status: Single    Spouse name: N/A  . Number of children: N/A  . Years of education: 19   Occupational History  . retired    Social History Main Topics  . Smoking status: Current Every Day Smoker    Packs/day: 1.00    Years: 50.00    Types: Cigarettes  . Smokeless tobacco: Never Used  . Alcohol use No  . Drug use: No  . Sexual activity: No   Other Topics Concern  . Not on file   Social History Narrative  . No narrative on file    History  Smoking Status  . Current Every Day Smoker  . Packs/day: 1.00  . Years: 50.00  . Types: Cigarettes  Smokeless Tobacco  . Never Used    History  Alcohol Use No     Allergies  Allergen Reactions  . Ace Inhibitors Itching    Current Outpatient Prescriptions  Medication Sig Dispense Refill  . aspirin EC 81 MG tablet Take 81 mg by mouth daily.    . rosuvastatin (CRESTOR) 40 MG tablet Take 1 tablet (40 mg total) by mouth daily. 30 tablet 3   No current facility-administered medications for this visit.       Review of Systems:     Cardiac Review of Systems: Y or N  Chest Pain [  y  ]  Resting SOB [ n  ] Exertional SOB  [ y ]  Orthopnea [ n ]   Pedal Edema [  n ]    Palpitations [ n ] Syncope  [ y ]   Presyncope Blue.Reese   ]  General Review of Systems: [Y] = yes [  ]=no Constitional: recent weight change [  ];  Wt loss over the last 3 months [   ] anorexia [  ]; fatigue [ y ]; nausea [  ]; night sweats [  ]; fever [  ]; or chills [  ];          Dental: poor dentition[ y ]; Last Dentist visit:   Eye : blurred vision [  ];  diplopia [   ]; vision changes [  ];  Amaurosis fugax[  ]; Resp: cough [ y ];  wheezing[y  ];  hemoptysis[ n ]; shortness of breath[ n ]; paroxysmal  nocturnal dyspnea[ y ]; dyspnea on exertion[  ]; or orthopnea[  ];  GI:  gallstones[  ], vomiting[n  ];  dysphagia[  ]; melena[  ];  hematochezia [  ]; heartburn[  ];   Hx of  Colonoscopy[  ]; GU: kidney stones [  ]; hematuria[  ];   dysuria [  ];  nocturia[  ];  history of     obstruction [  ]; urinary frequency [  ]             Skin: rash, swelling[  ];, hair loss[  ];  peripheral edema[  ];  or itching[  ]; Musculosketetal: myalgias[  ];  joint swelling[  ];  joint erythema[  ];  joint pain[  ];  back pain[  ];  Heme/Lymph: bruising[  ];  bleeding[  ];  anemia[  ];  Neuro: TIA[  n];  headaches[  ];  stroke[ n ];  vertigo[  ];  seizures[ n ];   paresthesias[  ];  difficulty walking[  ];  Psych:depression[  ]; anxiety[  ];  Endocrine: diabetes[  ];  thyroid dysfunction[  ];  Immunizations: Flu up to date [n  ]; Pneumococcal up to date Florencio.Farrier  ];  Other:  Physical Exam: BP (!) 160/88 (BP Location: Left Arm, Patient Position: Bed low/side rails up, Cuff Size: Normal)   Pulse (!) 57   Resp 16   Ht '6\' 4"'$  (1.93 m)   Wt 144 lb (65.3 kg)   SpO2 99% Comment: ON RA  BMI 17.53 kg/m   PHYSICAL EXAMINATION: General appearance: alert, cooperative and cachectic Head: Normocephalic, without obvious abnormality, atraumatic Neck: no adenopathy, no carotid bruit, no JVD, supple, symmetrical, trachea midline and thyroid not enlarged, symmetric, no tenderness/mass/nodules Lymph nodes: Cervical, supraclavicular, and axillary nodes normal. Resp: diminished breath sounds bibasilar Back: symmetric, no curvature. ROM normal. No CVA tenderness. Cardio: regular rate and rhythm, S1, S2 normal, no murmur, click, rub or gallop GI: soft, non-tender; bowel sounds normal; no masses,  no organomegaly Extremities: extremities normal, atraumatic, no cyanosis or edema and Homans sign is negative, no sign of DVT Neurologic: Grossly normal  Diagnostic Studies & Laboratory data:     Recent Radiology Findings:   Dg Chest  2 View  Result Date: 03/28/2016 CLINICAL DATA:  Syncope, chest pain for 1 day. History of COPD, hypertension, pulmonary nodule. EXAM: CHEST  2 VIEW COMPARISON:  Chest radiograph Jul 31, 2011 FINDINGS: LEFT upper lobe mass density with central air density. Increased lung volumes with chronic interstitial changes. No pleural effusion. Cardiomediastinal silhouette is normal. Status post median sternotomy for CABG. Mildly calcified aortic knob. No pneumothorax. Soft tissue planes and included osseous structures are nonsuspicious. Partially imaged abdominal aortic stent graft. IMPRESSION: New masslike density LEFT upper lobe for which CT chest with contrast is recommended. COPD. Electronically Signed   By: Elon Alas M.D.   On: 03/28/2016 15:28   Ct Head Wo Contrast  Result Date: 03/28/2016 CLINICAL DATA:  Known chest mass, evaluate for brain mass EXAM: CT HEAD WITHOUT CONTRAST TECHNIQUE: Contiguous axial images were obtained from the base of the skull through the vertex without intravenous contrast. COMPARISON:  CT chest 03/28/2016, CT head 07/28/2008 FINDINGS: Brain: No acute territorial infarction, intracranial hemorrhage or extra-axial fluid collection. No  focal mass, mass effect or midline shift. Dense and prominent basal ganglial calcifications as before. Ventricles stable in size. Mild atrophy. Vascular: No hyperdense vessels. Scattered calcifications within the carotid siphons. Mild dolichoectasia of the vertebral basilar system. Skull: Normal. Negative for fracture or focal lesion. Sinuses/Orbits: Mucosal thickening within the sphenoid ethmoid and maxillary sinuses with probable postsurgical changes of right maxillary sinus. No acute orbital abnormality. Other: None IMPRESSION: No CT evidence for acute intracranial abnormality. Electronically Signed   By: Donavan Foil M.D.   On: 03/28/2016 20:44   Ct Chest Wo Contrast  Result Date: 03/28/2016 CLINICAL DATA:  Left chest mass. EXAM: CT CHEST  WITHOUT CONTRAST TECHNIQUE: Multidetector CT imaging of the chest was performed following the standard protocol without IV contrast. COMPARISON:  Chest x-ray day 03/28/2016 FINDINGS: Cardiovascular: The heart size is normal. No pericardial effusion. Coronary artery calcification is noted. Ascending thoracic aorta measures 4 cm diameter. Atherosclerotic calcification is noted in the wall of the thoracic aorta. Mediastinum/Nodes: No mediastinal lymphadenopathy. No evidence for gross hilar lymphadenopathy although assessment is limited by the lack of intravenous contrast on today's study. The esophagus has normal imaging features. There is no axillary lymphadenopathy. Lungs/Pleura: 3.3 x 4.5 x 4.7 cm spiculated cavitary mass left upper lobe consistent with neoplasm. Moderate to severe emphysema noted. Upper Abdomen: Aortic endograft incompletely visualized. Musculoskeletal: Bone windows reveal no worrisome lytic or sclerotic osseous lesions. IMPRESSION: 4.7 cm spiculated cavitary mass left upper lobe consistent with neoplasm. Electronically Signed   By: Misty Stanley M.D.   On: 03/28/2016 17:18   Mr Jeri Cos YP Contrast  Result Date: 03/29/2016 CLINICAL DATA:  66 y/o  M; dizziness with fall.  Lung mass. EXAM: MRI HEAD WITHOUT AND WITH CONTRAST TECHNIQUE: Multiplanar, multiecho pulse sequences of the brain and surrounding structures were obtained without and with intravenous contrast. CONTRAST:  71m MULTIHANCE GADOBENATE DIMEGLUMINE 529 MG/ML IV SOLN COMPARISON:  03/28/2016 CT head. FINDINGS: Brain: No diffusion signal abnormality. No abnormal susceptibility hypointensity. Few foci of T2 FLAIR hyperintensity in periventricular white matter compatible with mild chronic microvascular ischemic changes. There is mild brain parenchymal volume loss. No abnormal enhancement of the brain. No focal mass effect, hydrocephalus, or extra-axial collection. Vascular: Increased T2 signal within the normally enhancing left upper  internal jugular vein and sigmoid sinus is likely due to slow flow. Vascular flow voids are otherwise unremarkable. Skull and upper cervical spine: Normal marrow signal. Sinuses/Orbits: Mild diffuse paranasal sinus mucosal thickening and partial opacification of left posterior ethmoid air cells. No abnormal signal of mastoid air cells. Other: None. IMPRESSION: 1. No acute intracranial abnormality identified. No abnormal enhancement of the brain. 2. Mild chronic microvascular ischemic changes of the brain. 3. Mild paranasal sinus disease. Electronically Signed   By: LKristine GarbeM.D.   On: 03/29/2016 23:56   Ct Abdomen Pelvis W Contrast  Result Date: 03/29/2016 CLINICAL DATA:  5 cm cavitary mass of the left upper lobe. Further assessment for evidence of metastatic disease. EXAM: CT ABDOMEN AND PELVIS WITH CONTRAST TECHNIQUE: Multidetector CT imaging of the abdomen and pelvis was performed using the standard protocol following bolus administration of intravenous contrast. CONTRAST:  1016mISOVUE-300 IOPAMIDOL (ISOVUE-300) INJECTION 61% COMPARISON:  CT of the chest on 03/28/2016. CTA of the abdomen and pelvis on 08/31/2011. FINDINGS: Lower chest: Emphysematous lung disease and bibasilar scarring present. Hepatobiliary: No focal liver abnormality is seen. No gallstones, gallbladder wall thickening, or biliary dilatation. Pancreas: Unremarkable. No pancreatic ductal dilatation or surrounding inflammatory changes. Spleen:  Normal in size without focal abnormality. Adrenals/Urinary Tract: Adrenal glands are unremarkable. Kidneys are normal, without renal calculi, focal lesion, or hydronephrosis. Bladder is unremarkable. Stomach/Bowel: No evidence of bowel obstruction, inflammation or lesion. No free air. Vascular/Lymphatic: Aortic endograft again present showing normal and stable patency and position. There is no evidence of endoleak with further contraction of the aortic sac surrounding the endograft. The  excluded sac now measures 2.3 x 2.9 cm (previously 3.4 x 3.6 cm). No enlarged lymph nodes identified. Musculoskeletal: No acute or significant osseous findings. IMPRESSION: 1. No evidence of metastatic disease in the abdomen or pelvis. 2. Further decrease in size of excluded abdominal aortic aneurysm sac surrounding a normally patent endograft. Electronically Signed   By: Aletta Edouard M.D.   On: 03/29/2016 15:04   Dg Chest Port 1 View  Result Date: 04/12/2016 CLINICAL DATA:  Bronchoscopy.  Biopsy . EXAM: PORTABLE CHEST 1 VIEW COMPARISON:  CT 03/28/2016 .  Chest x-ray 03/28/2016 . FINDINGS: Mediastinum and hilar structures are normal. Ill-defined cavitary density noted in the left upper lobe. Mild infiltrate left upper lobe. Prior CABG. Heart size normal. No pleural effusion or pneumothorax. No acute bony abnormality. IMPRESSION: 1. Ill-defined cavitary mass again noted in the left upper lobe. This is unchanged. 2. Mild left upper lobe pulmonary infiltrates .  No pneumothorax. Electronically Signed   By: Marcello Moores  Register   On: 04/12/2016 09:24   Path: Diagnosis Lung, transbronchial biopsy, Left Upper Lobe - INVASIVE SQUAMOUS CELL CARCINOMA. - SEE COMMENT. Microscopic Comment As sampled, the tumor appears poorly differentiated. Dr. Lyndon Code has seen this case in consultation with agreement. The findings are called to Dr. Lake Bells on 04/13/16. The tumor will be sent for PDL-1 [facility]) testing, as requested by Dr. Lake Bells. There is likely sufficient material for additional molecular studies which can be performed upon request. (RAH:gt, 04/13/16) Willeen Niece MD Pathologist, Electronic Signature (Case signed 04/13/2016)    I have independently reviewed the above radiologic studies.  Recent Lab Findings: Lab Results  Component Value Date   WBC 5.8 03/29/2016   HGB 12.9 (L) 03/29/2016   HCT 40.2 03/29/2016   PLT 210 03/29/2016   GLUCOSE 124 (H) 03/29/2016   CHOL 243 (H) 05/28/2014   TRIG 88.0  05/28/2014   HDL 69.40 05/28/2014   LDLDIRECT 145.6 10/14/2012   LDLCALC 156 (H) 05/28/2014   ALT 16 (L) 03/28/2016   AST 27 03/28/2016   NA 138 03/29/2016   K 4.1 03/29/2016   CL 106 03/29/2016   CREATININE 1.18 03/29/2016   BUN 18 03/29/2016   CO2 25 03/29/2016   TSH 0.61 05/28/2014   INR 0.98 07/26/2011   HGBA1C (H) 08/02/2008    6.4 (NOTE) The ADA recommends the following therapeutic goal for glycemic control related to Hgb A1c measurement: Goal of therapy: <6.5 Hgb A1c  Reference: American Diabetes Association: Clinical Practice Recommendations 2010, Diabetes Care, 2010, 33: (Suppl  1).   Previous CABG:  08/03/2008  OPERATIVE REPORT  PREOPERATIVE DIAGNOSES:  Recent lateral wall myocardial infarction with  three-vessel coronary occlusive disease.  POSTOPERATIVE DIAGNOSES:  Recent lateral wall myocardial infarction with  three-vessel coronary occlusive disease.  SURGICAL PROCEDURES:  Coronary artery bypass grafting x5 with left  internal mammary to the left anterior descending coronary artery,  reverse saphenous vein graft to the first diagonal coronary artery,  sequential reverse saphenous vein graft to the first obtuse marginal and  distal circumflex, reverse saphenous vein graft to the posterior  descending coronary artery with attempted  endovein harvest.  SURGEON:  Lanelle Bal, MD Study Highlights   04/06/2015  Nuclear stress EF: 43%.  The left ventricular ejection fraction is moderately decreased (30-44%).  There was no ST segment deviation noted during stress.  Defect 1: There is a medium defect of moderate severity.  Findings consistent with prior myocardial infarction with peri-infarct ischemia.  This is an intermediate risk study.   There is a scar in the basal inferior, inferolateral, mid and apical inferior walls with minimal ischemia in the mid inferolateral wall (SDS = 2).       Assessment / Plan:   #1 squamous cell carcinoma left upper  lobe- needs full staging including PET scan 4.7 cm #2 known significant LV dysfunction with ejection fraction of 3--44%, and evidence of pulmonary hypertension by echo #3 chronic obstructive pulmonary disease likely significant symptoms but will obtain full pulmonary function studies-  #4 known aneurysmal disease with mildly dilated ascending aorta, previous abdominal aneurysm repaired with stent graft 2013 #5 known coronary occlusive disease status post coronary artery bypass grafting times 07/29/2008  With the patient's significant smoking history, significant COPD, I doubt he will become an operative candidate but we will finish the staging workup and then decide on treatment plan after PFTs and PET scan are completed. This is been explained to the patient and assist her in detail   I  spent 40 minutes counseling the patient face to face and 50% or more the  time was spent in counseling and coordination of care. The total time spent in the appointment was 60 minutes.  Grace Isaac MD      [address] [place] [address] Office [phone]   Beeper [phone]  04/17/2016 2:24 PM

## 2016-04-23 ENCOUNTER — Encounter (HOSPITAL_COMMUNITY): Payer: Self-pay

## 2016-04-25 ENCOUNTER — Encounter (HOSPITAL_COMMUNITY): Payer: Medicare Other

## 2016-04-25 ENCOUNTER — Ambulatory Visit (HOSPITAL_COMMUNITY)
Admission: RE | Admit: 2016-04-25 | Discharge: 2016-04-25 | Disposition: A | Discharge status: Home / Self Care | Payer: Medicare Other | Source: Ambulatory Visit | Attending: Cardiothoracic Surgery | Admitting: Cardiothoracic Surgery

## 2016-04-25 DIAGNOSIS — F17210 Nicotine dependence, cigarettes, uncomplicated: Secondary | ICD-10-CM | POA: Diagnosis not present

## 2016-04-25 DIAGNOSIS — R918 Other nonspecific abnormal finding of lung field: Secondary | ICD-10-CM

## 2016-04-25 DIAGNOSIS — S2241XD Multiple fractures of ribs, right side, subsequent encounter for fracture with routine healing: Secondary | ICD-10-CM | POA: Diagnosis not present

## 2016-04-25 DIAGNOSIS — J850 Gangrene and necrosis of lung: Secondary | ICD-10-CM | POA: Insufficient documentation

## 2016-04-25 DIAGNOSIS — J449 Chronic obstructive pulmonary disease, unspecified: Secondary | ICD-10-CM | POA: Insufficient documentation

## 2016-04-25 DIAGNOSIS — I2510 Atherosclerotic heart disease of native coronary artery without angina pectoris: Secondary | ICD-10-CM | POA: Diagnosis not present

## 2016-04-25 DIAGNOSIS — I714 Abdominal aortic aneurysm, without rupture: Secondary | ICD-10-CM | POA: Diagnosis not present

## 2016-04-25 DIAGNOSIS — R911 Solitary pulmonary nodule: Secondary | ICD-10-CM | POA: Insufficient documentation

## 2016-04-25 DIAGNOSIS — X58XXXD Exposure to other specified factors, subsequent encounter: Secondary | ICD-10-CM | POA: Insufficient documentation

## 2016-04-25 DIAGNOSIS — R942 Abnormal results of pulmonary function studies: Secondary | ICD-10-CM | POA: Insufficient documentation

## 2016-04-25 DIAGNOSIS — I700 Atherosclerosis of aorta: Secondary | ICD-10-CM | POA: Diagnosis not present

## 2016-04-25 DIAGNOSIS — I708 Atherosclerosis of other arteries: Secondary | ICD-10-CM | POA: Insufficient documentation

## 2016-04-25 LAB — GLUCOSE, CAPILLARY: Glucose-Capillary: 97 mg/dL (ref 65–99)

## 2016-04-25 LAB — PULMONARY FUNCTION TEST
DL/VA % pred: 36 %
DL/VA: 1.8 ml/min/mmHg/L
DLCO unc % pred: 30 %
DLCO unc: 12.2 ml/min/mmHg
FEF 25-75 Post: 1.15 L/sec
FEF 25-75 Pre: 0.64 L/sec
FEF2575-%Change-Post: 78 %
FEF2575-%Pred-Post: 36 %
FEF2575-%Pred-Pre: 20 %
FEV1-%Change-Post: 16 %
FEV1-%Pred-Post: 68 %
FEV1-%Pred-Pre: 59 %
FEV1-Post: 2.54 L
FEV1-Pre: 2.18 L
FEV1FVC-%Change-Post: 7 %
FEV1FVC-%Pred-Pre: 60 %
FEV6-%Change-Post: 19 %
FEV6-%Pred-Post: 101 %
FEV6-%Pred-Pre: 84 %
FEV6-Post: 4.7 L
FEV6-Pre: 3.92 L
FEV6FVC-%Change-Post: 10 %
FEV6FVC-%Pred-Post: 97 %
FEV6FVC-%Pred-Pre: 87 %
FVC-%Change-Post: 8 %
FVC-%Pred-Post: 104 %
FVC-%Pred-Pre: 96 %
FVC-Post: 5.04 L
FVC-Pre: 4.67 L
Post FEV1/FVC ratio: 50 %
Post FEV6/FVC ratio: 93 %
Pre FEV1/FVC ratio: 47 %
Pre FEV6/FVC Ratio: 84 %
RV % pred: 211 %
RV: 5.81 L
TLC % pred: 128 %
TLC: 10.6 L

## 2016-04-25 MED ORDER — FLUDEOXYGLUCOSE F - 18 (FDG) INJECTION
7.9100 | Freq: Once | INTRAVENOUS | Status: AC | PRN
  Administered 2016-04-25: 7.91 via INTRAVENOUS

## 2016-04-25 MED ORDER — ALBUTEROL SULFATE (2.5 MG/3ML) 0.083% IN NEBU
2.5000 mg | INHALATION_SOLUTION | Freq: Once | RESPIRATORY_TRACT | Status: AC
  Administered 2016-04-25: 2.5 mg via RESPIRATORY_TRACT

## 2016-05-02 ENCOUNTER — Other Ambulatory Visit: Payer: Medicare Other

## 2016-05-02 ENCOUNTER — Encounter: Payer: Self-pay | Admitting: Internal Medicine

## 2016-05-02 ENCOUNTER — Other Ambulatory Visit (INDEPENDENT_AMBULATORY_CARE_PROVIDER_SITE_OTHER): Payer: Medicare Other

## 2016-05-02 ENCOUNTER — Ambulatory Visit (INDEPENDENT_AMBULATORY_CARE_PROVIDER_SITE_OTHER): Payer: Medicare Other | Admitting: Internal Medicine

## 2016-05-02 VITALS — BP 128/88 | HR 86 | Temp 97.6°F | Ht 75.0 in | Wt 138.0 lb

## 2016-05-02 DIAGNOSIS — Z23 Encounter for immunization: Secondary | ICD-10-CM | POA: Diagnosis not present

## 2016-05-02 DIAGNOSIS — R972 Elevated prostate specific antigen [PSA]: Secondary | ICD-10-CM

## 2016-05-02 DIAGNOSIS — Z1159 Encounter for screening for other viral diseases: Secondary | ICD-10-CM

## 2016-05-02 DIAGNOSIS — E785 Hyperlipidemia, unspecified: Secondary | ICD-10-CM

## 2016-05-02 DIAGNOSIS — I10 Essential (primary) hypertension: Secondary | ICD-10-CM

## 2016-05-02 DIAGNOSIS — I255 Ischemic cardiomyopathy: Secondary | ICD-10-CM

## 2016-05-02 LAB — CBC WITH DIFFERENTIAL/PLATELET
BASOS PCT: 0.4 % (ref 0.0–3.0)
Basophils Absolute: 0 10*3/uL (ref 0.0–0.1)
EOS ABS: 0.2 10*3/uL (ref 0.0–0.7)
Eosinophils Relative: 2.3 % (ref 0.0–5.0)
HCT: 38.9 % — ABNORMAL LOW (ref 39.0–52.0)
Hemoglobin: 13 g/dL (ref 13.0–17.0)
LYMPHS PCT: 24.2 % (ref 12.0–46.0)
Lymphs Abs: 1.7 10*3/uL (ref 0.7–4.0)
MCHC: 33.4 g/dL (ref 30.0–36.0)
MCV: 91.6 fl (ref 78.0–100.0)
MONO ABS: 0.7 10*3/uL (ref 0.1–1.0)
Monocytes Relative: 10.1 % (ref 3.0–12.0)
NEUTROS ABS: 4.5 10*3/uL (ref 1.4–7.7)
Neutrophils Relative %: 63 % (ref 43.0–77.0)
PLATELETS: 248 10*3/uL (ref 150.0–400.0)
RBC: 4.25 Mil/uL (ref 4.22–5.81)
RDW: 14.2 % (ref 11.5–15.5)
WBC: 7.2 10*3/uL (ref 4.0–10.5)

## 2016-05-02 LAB — HEPATIC FUNCTION PANEL
ALT: 14 U/L (ref 0–53)
AST: 20 U/L (ref 0–37)
Albumin: 4.1 g/dL (ref 3.5–5.2)
Alkaline Phosphatase: 97 U/L (ref 39–117)
BILIRUBIN TOTAL: 0.7 mg/dL (ref 0.2–1.2)
Bilirubin, Direct: 0.1 mg/dL (ref 0.0–0.3)
TOTAL PROTEIN: 7.2 g/dL (ref 6.0–8.3)

## 2016-05-02 LAB — BASIC METABOLIC PANEL
BUN: 17 mg/dL (ref 6–23)
CHLORIDE: 102 meq/L (ref 96–112)
CO2: 28 meq/L (ref 19–32)
CREATININE: 1.04 mg/dL (ref 0.40–1.50)
Calcium: 9.6 mg/dL (ref 8.4–10.5)
GFR: 91.33 mL/min (ref >60.00)
Glucose, Bld: 80 mg/dL (ref 70–99)
POTASSIUM: 4.1 meq/L (ref 3.5–5.1)
Sodium: 137 mEq/L (ref 135–145)

## 2016-05-02 LAB — LIPID PANEL
CHOL/HDL RATIO: 3
CHOLESTEROL: 204 mg/dL — AB (ref 0–200)
HDL: 63.1 mg/dL (ref >39.00)
LDL Cholesterol: 122 mg/dL — ABNORMAL HIGH (ref 0–99)
NonHDL: 140.49
TRIGLYCERIDES: 90 mg/dL (ref 0.0–149.0)
VLDL: 18 mg/dL (ref 0.0–40.0)

## 2016-05-02 LAB — PSA: PSA: 1.75 ng/mL (ref 0.10–4.00)

## 2016-05-02 LAB — TSH: TSH: 0.61 u[IU]/mL (ref 0.35–4.50)

## 2016-05-02 NOTE — Progress Notes (Signed)
Subjective:    Patient ID: [name], male    DOB: 1950-05-12, 66 y.o.   MRN: [id number]  HPI  Here for yearly f/u;  Overall doing ok;  Pt denies Chest pain, worsening SOB, DOE, wheezing, orthopnea, PND, worsening LE edema, palpitations, dizziness or syncope.  Pt denies neurological change such as new headache, facial or extremity weakness.  Pt denies polydipsia, polyuria, or low sugar symptoms. Pt states overall good compliance with treatment and medications, good tolerability, and has been trying to follow appropriate diet.  Pt denies worsening depressive symptoms, suicidal ideation or panic. No fever, night sweats, wt loss, loss of appetite, or other constitutional symptoms.  Pt states good ability with ADL's, has low fall risk, home safety reviewed and adequate, no other significant changes in hearing or vision.  Denies urinary symptoms such as dysuria, frequency, urgency, flank pain, hematuria or n/v, fever, chills. Past Medical History:  Diagnosis Date  . AAA (abdominal aortic aneurysm) ([place])   . Abdominal aortic aneurysm ([place])    AAA And bilateral common iliac artery aneurysms  . BPH (benign prostatic hyperplasia)   . CAD (coronary artery disease)    Dr Ron Parker @ [facility]   . Chronic mental illness 04/21/2011   Diagnosis unclear,  Family provides good care  . COPD (chronic obstructive pulmonary disease) ([place]) 04/21/2011   smoking  . Ejection fraction    EF 35-40%, echo, May, 2010  . Hyperlipidemia   . Hyperplastic colon polyp 04/21/2011  . Hypertension   . Preop cardiovascular exam    Cardiac clearance for major vascular surgery, May, 2013  . Pulmonary nodule    Chest CT May, 2013, 2 small pulmonary nodules, needs appropriate followup,  this CT was not ordered by the cardiology team  . PVD (peripheral vascular disease) ([place]) 04/21/2011  . Retention of urine   . Shortness of breath   . Tobacco abuse    Past Surgical History:  Procedure Laterality Date  . ABDOMINAL AORTA STENT   07/2011  . ABDOMINAL AORTAGRAM N/A 07/26/2011   Procedure: ABDOMINAL Maxcine Ham;  Surgeon: Conrad Carter, MD;  Location: [facility] CATH LAB;  Service: Cardiovascular;  Laterality: N/A;  . ABDOMINAL AORTIC ANEURYSM REPAIR  07/2011  . CORONARY ARTERY BYPASS GRAFT  07/2008   CABG X5/notes 07/12/2010  . EMBOLIZATION Left 07/26/2011   Procedure: EMBOLIZATION;  Surgeon: Conrad [place], MD;  Location[facility] CATH LAB;  Service: Cardiovascular;  Laterality: Left;  . TRANSURETHRAL RESECTION OF PROSTATE  11/29/2011   Procedure: TRANSURETHRAL RESECTION OF THE PROSTATE WITH GYRUS INSTRUMENTS;  Surgeon: Malka So, MD;  Location: [facility] ORS;  Service: Urology;  Laterality: N/A;  Prostate Ultrasound, and Biopsy  . VIDEO BRONCHOSCOPY Bilateral 04/12/2016   Procedure: VIDEO BRONCHOSCOPY WITH FLUORO;  Surgeon: Juanito Doom, MD;  Location: [facility];  Service: Cardiopulmonary;  Laterality: Bilateral;    reports that he has been smoking Cigarettes.  He has a 50.00 pack-year smoking history. He has never used smokeless tobacco. He reports that he does not drink alcohol or use drugs. family history includes Arthritis in his sister; Cancer in his father; Diabetes in his father; Heart attack (age of onset: 53) in his father; Heart disease in his father; Hyperlipidemia in his sister; Hypertension in his sister. Allergies  Allergen Reactions  . Ace Inhibitors Itching   Current Outpatient Prescriptions on File Prior to Visit  Medication Sig Dispense Refill  . aspirin EC 81 MG tablet Take 81 mg by mouth daily.    [name]  rosuvastatin (CRESTOR) 40 MG tablet Take 1 tablet (40 mg total) by mouth daily. 30 tablet 3   No current facility-administered medications on file prior to visit.     Review of Systems Constitutional: Negative for increased diaphoresis, or other activity, appetite or siginficant weight change other than noted HENT: Negative for worsening hearing loss, ear pain, facial swelling, mouth sores and neck stiffness.   Eyes:  Negative for other worsening pain, redness or visual disturbance.  Respiratory: Negative for choking or stridor Cardiovascular: Negative for other chest pain and palpitations.  Gastrointestinal: Negative for worsening diarrhea, blood in stool, or abdominal distention Genitourinary: Negative for hematuria, flank pain or change in urine volume.  Musculoskeletal: Negative for myalgias or other joint complaints.  Skin: Negative for other color change and wound or drainage.  Neurological: Negative for syncope and numbness. other than noted Hematological: Negative for adenopathy. or other swelling Psychiatric/Behavioral: Negative for hallucinations, SI, self-injury, decreased concentration or other worsening agitation.  All other system neg per pt    Objective:   Physical Exam BP 128/88   Pulse 86   Temp 97.6 F (36.4 C)   Ht '6\' 3"'$  (1.905 m)   Wt 138 lb (62.6 kg)   SpO2 98%   BMI 17.25 kg/m  VS noted, not ill appearing Constitutional: Pt is oriented to person, place, and time. Appears well-developed and well-nourished, in no significant distress Head: Normocephalic and atraumatic  Eyes: Conjunctivae and EOM are normal. Pupils are equal, round, and reactive to light Right Ear: External ear normal.  Left Ear: External ear normal Nose: Nose normal.  Mouth/Throat: Oropharynx is clear and moist  Neck: Normal range of motion. Neck supple. No JVD present. No tracheal deviation present or significant neck LA or mass Cardiovascular: Normal rate, regular rhythm, normal heart sounds and intact distal pulses.   Pulmonary/Chest: Effort normal and breath sounds without rales or wheezing  Abdominal: Soft. Bowel sounds are normal. NT. No HSM  Musculoskeletal: Normal range of motion. Exhibits no edema Lymphadenopathy: Has no cervical adenopathy.  Neurological: Pt is alert and oriented to person, place, and time. Pt has normal reflexes. No cranial nerve deficit. Motor grossly intact Skin: Skin is warm  and dry. No rash noted or new ulcers Psychiatric:  Has nervous mood and affect. Behavior is normal.  No other exam findings  Lab Results  Component Value Date   WBC 7.2 05/02/2016   HGB 13.0 05/02/2016   HCT 38.9 (L) 05/02/2016   PLT 248.0 05/02/2016   GLUCOSE 80 05/02/2016   CHOL 204 (H) 05/02/2016   TRIG 90.0 05/02/2016   HDL 63.10 05/02/2016   LDLDIRECT 145.6 10/14/2012   LDLCALC 122 (H) 05/02/2016   ALT 14 05/02/2016   AST 20 05/02/2016   NA 137 05/02/2016   K 4.1 05/02/2016   CL 102 05/02/2016   CREATININE 1.04 05/02/2016   BUN 17 05/02/2016   CO2 28 05/02/2016   TSH 0.61 05/02/2016   PSA 1.75 05/02/2016   INR 0.98 07/26/2011   HGBA1C (H) 08/02/2008    6.4 (NOTE) The ADA recommends the following therapeutic goal for glycemic control related to Hgb A1c measurement: Goal of therapy: <6.5 Hgb A1c  Reference: American Diabetes Association: Clinical Practice Recommendations 2010, Diabetes Care, 2010, 33: (Suppl  1).       Assessment & Plan:

## 2016-05-02 NOTE — Patient Instructions (Addendum)
You had the flu shot today, and the Pneumovax shot today  Please continue all other medications as before, and refills have been done if requested.  Please have the pharmacy call with any other refills you may need.  Please continue your efforts at being more active, low cholesterol diet, and weight control.  You are otherwise up to date with prevention measures today.  Please keep your appointments with your specialists as you may have planned  Please go to the LAB in the Basement (turn left off the elevator) for the tests to be done today  You will be contacted by phone if any changes need to be made immediately.  Otherwise, you will receive a letter about your results with an explanation, but please check with MyChart first.  Please remember to sign up for MyChart if you have not done so, as this will be important to you in the future with finding out test results, communicating by private email, and scheduling acute appointments online when needed.  If you have Medicare related insurance (such as traditoinal Medicare, [employer] or [employer], or similar), Please make an appointment at the Scheduling desk with Maudie Mercury, the [employer], for your Wellness Visit in this office, which is a benefit with your insurance.  Please return in 6 months, or sooner if needed

## 2016-05-03 ENCOUNTER — Encounter: Payer: Self-pay | Admitting: Cardiothoracic Surgery

## 2016-05-03 ENCOUNTER — Ambulatory Visit (INDEPENDENT_AMBULATORY_CARE_PROVIDER_SITE_OTHER): Payer: Medicare Other | Admitting: Cardiothoracic Surgery

## 2016-05-03 VITALS — BP 124/81 | HR 85 | Resp 20 | Ht 75.0 in | Wt 135.0 lb

## 2016-05-03 DIAGNOSIS — C3412 Malignant neoplasm of upper lobe, left bronchus or lung: Secondary | ICD-10-CM

## 2016-05-03 DIAGNOSIS — I255 Ischemic cardiomyopathy: Secondary | ICD-10-CM | POA: Diagnosis not present

## 2016-05-03 LAB — HEPATITIS C ANTIBODY: HCV AB: NEGATIVE

## 2016-05-03 NOTE — Progress Notes (Signed)
[address]       [place] [address]             [phone]                    Derry K Boeder [facility] Medical Record #[id number] Date of Birth: [birth year]  Referring: Juanito Doom, MD Primary Care: [name], MD  Chief Complaint:    Lung Mass - Lung Cancer Cancer Staging Lung cancer [facility]) Staging form: Lung, AJCC 8th Edition - Clinical stage from 05/03/2016: Stage IIB (cT2b, cN1, cM0) - Signed by Grace Isaac, MD on 05/03/2016    History of Present Illness:    [name] 66 y.o. male is seen in the office  today for Recent diagnosis of squamous cell carcinoma of the left upper lobe . The patient is well-known to me having had coronary artery bypass grafting 5 done after a lateral wall myocardial infarction in 2010. The patient recently had a syncopal episode at home, and evaluation of this he was taken to the emergency room by EMS. Chest x-ray showed left upper lobe lung mass, confirmed on CT scan. Previous x-rays in 2010 2013 showed no evidence of lung mass. Unfortunately the patient continues to smoke at least one pack per day, has significant underlying pulmonary disease, full PFTs have been done. Prior to bronchoscopy echocardiogram and stress test were performed.  The patient does walk frequently, but fatigues and gets short of breath if he walks on any incline.   Current Activity/ Functional Status:  Patient is independent with mobility/ambulation, transfers, ADL's, IADL's.   Zubrod Score: At the time of surgery this patient's most appropriate activity status/level should be described as: '[]'$     0    Normal activity, no symptoms '[x]'$     1    Restricted in physical strenuous activity but ambulatory, able to do out light work '[]'$     2    Ambulatory and capable of self care, unable to do work activities, up and about               >50 % of waking hours                              '[]'$     3    Only limited self care, in bed greater than  50% of waking hours '[]'$     4    Completely disabled, no self care, confined to bed or chair '[]'$     5    Moribund   Past Medical History:  Diagnosis Date  . AAA (abdominal aortic aneurysm) ([place])   . Abdominal aortic aneurysm ([place])    AAA And bilateral common iliac artery aneurysms  . BPH (benign prostatic hyperplasia)   . CAD (coronary artery disease)    Dr Ron Parker @ [facility]   . Chronic mental illness 04/21/2011   Diagnosis unclear,  Family provides good care  . COPD (chronic obstructive pulmonary disease) ([place]) 04/21/2011   smoking  . Ejection fraction    EF 35-40%, echo, May, 2010  . Hyperlipidemia   . Hyperplastic colon polyp 04/21/2011  . Hypertension   . Preop cardiovascular exam    Cardiac clearance for major vascular surgery, May, 2013  . Pulmonary nodule    Chest CT May, 2013, 2 small pulmonary nodules, needs appropriate followup,  this CT was not ordered by the cardiology  team  . PVD (peripheral vascular disease) ([place]) 04/21/2011  . Retention of urine   . Shortness of breath   . Tobacco abuse     Past Surgical History:  Procedure Laterality Date  . ABDOMINAL AORTA STENT  07/2011  . ABDOMINAL AORTAGRAM N/A 07/26/2011   Procedure: ABDOMINAL Maxcine Ham;  Surgeon: Conrad Storrs, MD;  Location: [facility] CATH LAB;  Service: Cardiovascular;  Laterality: N/A;  . ABDOMINAL AORTIC ANEURYSM REPAIR  07/2011  . CORONARY ARTERY BYPASS GRAFT  07/2008   CABG X5/notes 07/12/2010  . EMBOLIZATION Left 07/26/2011   Procedure: EMBOLIZATION;  Surgeon: Conrad Caledonia, MD;  Location: [facility] CATH LAB;  Service: Cardiovascular;  Laterality: Left;  . TRANSURETHRAL RESECTION OF PROSTATE  11/29/2011   Procedure: TRANSURETHRAL RESECTION OF THE PROSTATE WITH GYRUS INSTRUMENTS;  Surgeon: Malka So, MD;  Location: [facility] ORS;  Service: Urology;  Laterality: N/A;  Prostate Ultrasound, and Biopsy  . VIDEO BRONCHOSCOPY Bilateral 04/12/2016   Procedure: VIDEO BRONCHOSCOPY WITH FLUORO;  Surgeon: Juanito Doom, MD;  Location: [facility];  Service: Cardiopulmonary;  Laterality: Bilateral;    Family History  Problem Relation Age of Onset  . Cancer Father     prostate  . Heart disease Father   . Diabetes Father   . Heart attack Father 37  . Arthritis Sister   . Hyperlipidemia Sister   . Hypertension Sister   . Anesthesia problems Neg Hx     Social History   Social History  . Marital status: Single    Spouse name: N/A  . Number of children: N/A  . Years of education: 31   Occupational History  . retired    Social History Main Topics  . Smoking status: Current Every Day Smoker    Packs/day: 1.00    Years: 50.00    Types: Cigarettes  . Smokeless tobacco: Never Used  . Alcohol use No  . Drug use: No  . Sexual activity: No   Other Topics Concern  . Not on file   Social History Narrative  . No narrative on file    History  Smoking Status  . Current Every Day Smoker  . Packs/day: 1.00  . Years: 50.00  . Types: Cigarettes  Smokeless Tobacco  . Never Used    History  Alcohol Use No     Allergies  Allergen Reactions  . Ace Inhibitors Itching    Current Outpatient Prescriptions  Medication Sig Dispense Refill  . aspirin EC 81 MG tablet Take 81 mg by mouth daily.    . rosuvastatin (CRESTOR) 40 MG tablet Take 1 tablet (40 mg total) by mouth daily. 30 tablet 3   No current facility-administered medications for this visit.       Review of Systems:     Cardiac Review of Systems: Y or N  Chest Pain [  y  ]  Resting SOB [ n  ] Exertional SOB  [ y ]  Orthopnea [ n ]   Pedal Edema [  n ]    Palpitations [ n ] Syncope  [ y ]   Presyncope Blue.Reese   ]  General Review of Systems: [Y] = yes [  ]=no Constitional: recent weight change [  ];  Wt loss over the last 3 months [   ] anorexia [  ]; fatigue [ y ]; nausea [  ]; night sweats [  ]; fever [  ]; or chills [  ];  Dental: poor dentition[ y ]; Last Dentist visit:   Eye : blurred vision [  ]; diplopia [   ]; vision changes [  ];   Amaurosis fugax[  ]; Resp: cough [ y ];  wheezing[y  ];  hemoptysis[ n ]; shortness of breath[ n ]; paroxysmal nocturnal dyspnea[ y ]; dyspnea on exertion[  ]; or orthopnea[  ];  GI:  gallstones[  ], vomiting[n  ];  dysphagia[  ]; melena[  ];  hematochezia [  ]; heartburn[  ];   Hx of  Colonoscopy[  ]; GU: kidney stones [  ]; hematuria[  ];   dysuria [  ];  nocturia[  ];  history of     obstruction [  ]; urinary frequency [  ]             Skin: rash, swelling[  ];, hair loss[  ];  peripheral edema[  ];  or itching[  ]; Musculosketetal: myalgias[  ];  joint swelling[  ];  joint erythema[  ];  joint pain[  ];  back pain[  ];  Heme/Lymph: bruising[  ];  bleeding[  ];  anemia[  ];  Neuro: TIA[  n];  headaches[  ];  stroke[ n ];  vertigo[  ];  seizures[ n ];   paresthesias[  ];  difficulty walking[  ];  Psych:depression[  ]; anxiety[  ];  Endocrine: diabetes[  ];  thyroid dysfunction[  ];  Immunizations: Flu up to date [n  ]; Pneumococcal up to date Milo.Brash  ];  Other:  Physical Exam: BP 124/81   Pulse 85   Resp 20   Ht 6\' 3"  (1.905 m)   Wt 135 lb (61.2 kg)   SpO2 98% Comment: RA  BMI 16.87 kg/m   PHYSICAL EXAMINATION: General appearance: alert, cooperative and cachectic Head: Normocephalic, without obvious abnormality, atraumatic Neck: no adenopathy, no carotid bruit, no JVD, supple, symmetrical, trachea midline and thyroid not enlarged, symmetric, no tenderness/mass/nodules Lymph nodes: Cervical, supraclavicular, and axillary nodes normal. Resp: diminished breath sounds bibasilar Back: symmetric, no curvature. ROM normal. No CVA tenderness. Cardio: regular rate and rhythm, S1, S2 normal, no murmur, click, rub or gallop GI: soft, non-tender; bowel sounds normal; no masses,  no organomegaly Extremities: extremities normal, atraumatic, no cyanosis or edema and Homans sign is negative, no sign of DVT Neurologic: Grossly normal  Diagnostic Studies & Laboratory data:     Recent Radiology  Findings:  Nm Pet Image Initial (pi) Skull Base To Thigh  Result Date: 04/25/2016 CLINICAL DATA:  Initial treatment strategy for left upper lobe mass. EXAM: NUCLEAR MEDICINE PET SKULL BASE TO THIGH TECHNIQUE: 7.9 mCi F-18 FDG was injected intravenously. Full-ring PET imaging was performed from the skull base to thigh after the radiotracer. CT data was obtained and used for attenuation correction and anatomic localization. FASTING BLOOD GLUCOSE:  Value: 97 mg/dl COMPARISON:  Multiple exams, including 03/28/2016 and 03/29/2016 FINDINGS: NECK No hypermetabolic lymph nodes in the neck. Activity related to dental disease noted. Physiologic muscular activity along the neck. Physiologic glottic activity. CHEST Centrally necrotic 4.8 by 3.7 cm left upper lobe mass, maximum SUV 19.4, compatible with malignancy or granulomatous process. Small focus of hypermetabolic activity in the AP window/ left hilar region, maximum SUV 7.6, compatible with a small metastatic lymph node. Coronary, aortic arch, and branch vessel atherosclerotic vascular disease. Severe emphysema. Scarring or atelectasis in both lower lobes. Anteriorly in the left lower lobe there is a 5 mm pulmonary nodule which is  not measurably hypermetabolic but is below sensitive PET-CT size thresholds. Similarly the 3 mm right lower lobe nodule shown on prior CT chest is below sensitive PET-CT size thresholds. Both of these are stable from 2013 and considered benign. ABDOMEN/PELVIS No abnormal hypermetabolic activity within the liver, pancreas, adrenal glands, or spleen. No hypermetabolic lymph nodes in the abdomen or pelvis. Note is made of infrarenal abdominal aorta with aorta bi-iliac stent graft. Paucity of adipose tissue noted. Aortoiliac atherosclerotic vascular disease. SKELETON Hypermetabolic lesions of the distal right seventh, eighth, ninth, and tenth ribs favor healing fractures especially given the proximity of these lesions to the to each other. These  are along the anterolateral portions of the ribs. IMPRESSION: 1. Centrally necrotic 4.8 cm hypermetabolic left upper lobe mass, appearance favors malignancy such as squamous cell carcinoma. There is a small hypermetabolic AP window/left hilar region lymph node compatible with early nodal metastatic disease. No other metastatic lesions identified. Active granulomatous disease could have a similar appearance but is considered much less likely. The 5 mm pulmonary nodule in the left lower lobe and 3 mm right lower lobe pulmonary nodule are stable from 2013 and considered benign. 2. Healing distal right seventh, eighth, ninth, and tenth rib fractures are mildly hypermetabolic. Given the proximity of these fractures to each other, I am skeptical of bony metastatic disease in this vicinity. 3. Coronary, aortic arch, and branch vessel atherosclerotic vascular disease. Aortoiliac atherosclerotic vascular disease. Infrarenal abdominal aortic aneurysm with aorta bi-iliac stent graft. 4. Severe emphysema. Electronically Signed   By: Gaylyn Rong M.D.   On: 04/25/2016 16:32   Dg Chest Port 1 View  Result Date: 04/12/2016 CLINICAL DATA:  Bronchoscopy.  Biopsy . EXAM: PORTABLE CHEST 1 VIEW COMPARISON:  CT 03/28/2016 .  Chest x-ray 03/28/2016 . FINDINGS: Mediastinum and hilar structures are normal. Ill-defined cavitary density noted in the left upper lobe. Mild infiltrate left upper lobe. Prior CABG. Heart size normal. No pleural effusion or pneumothorax. No acute bony abnormality. IMPRESSION: 1. Ill-defined cavitary mass again noted in the left upper lobe. This is unchanged. 2. Mild left upper lobe pulmonary infiltrates .  No pneumothorax. Electronically Signed   By: Maisie Fus  Register   On: 04/12/2016 09:24   Dg C-arm Bronchoscopy  Result Date: 04/12/2016 C-ARM BRONCHOSCOPY: Fluoroscopy was utilized by the requesting physician.  No radiographic interpretation.    Dg Chest 2 View  Result Date: 03/28/2016 CLINICAL  DATA:  Syncope, chest pain for 1 day. History of COPD, hypertension, pulmonary nodule. EXAM: CHEST  2 VIEW COMPARISON:  Chest radiograph Jul 31, 2011 FINDINGS: LEFT upper lobe mass density with central air density. Increased lung volumes with chronic interstitial changes. No pleural effusion. Cardiomediastinal silhouette is normal. Status post median sternotomy for CABG. Mildly calcified aortic knob. No pneumothorax. Soft tissue planes and included osseous structures are nonsuspicious. Partially imaged abdominal aortic stent graft. IMPRESSION: New masslike density LEFT upper lobe for which CT chest with contrast is recommended. COPD. Electronically Signed   By: Awilda Metro M.D.   On: 03/28/2016 15:28   Ct Head Wo Contrast  Result Date: 03/28/2016 CLINICAL DATA:  Known chest mass, evaluate for brain mass EXAM: CT HEAD WITHOUT CONTRAST TECHNIQUE: Contiguous axial images were obtained from the base of the skull through the vertex without intravenous contrast. COMPARISON:  CT chest 03/28/2016, CT head 07/28/2008 FINDINGS: Brain: No acute territorial infarction, intracranial hemorrhage or extra-axial fluid collection. No focal mass, mass effect or midline shift. Dense and prominent basal ganglial calcifications as  before. Ventricles stable in size. Mild atrophy. Vascular: No hyperdense vessels. Scattered calcifications within the carotid siphons. Mild dolichoectasia of the vertebral basilar system. Skull: Normal. Negative for fracture or focal lesion. Sinuses/Orbits: Mucosal thickening within the sphenoid ethmoid and maxillary sinuses with probable postsurgical changes of right maxillary sinus. No acute orbital abnormality. Other: None IMPRESSION: No CT evidence for acute intracranial abnormality. Electronically Signed   By: Donavan Foil M.D.   On: 03/28/2016 20:44   Ct Chest Wo Contrast  Result Date: 03/28/2016 CLINICAL DATA:  Left chest mass. EXAM: CT CHEST WITHOUT CONTRAST TECHNIQUE: Multidetector CT  imaging of the chest was performed following the standard protocol without IV contrast. COMPARISON:  Chest x-ray day 03/28/2016 FINDINGS: Cardiovascular: The heart size is normal. No pericardial effusion. Coronary artery calcification is noted. Ascending thoracic aorta measures 4 cm diameter. Atherosclerotic calcification is noted in the wall of the thoracic aorta. Mediastinum/Nodes: No mediastinal lymphadenopathy. No evidence for gross hilar lymphadenopathy although assessment is limited by the lack of intravenous contrast on today's study. The esophagus has normal imaging features. There is no axillary lymphadenopathy. Lungs/Pleura: 3.3 x 4.5 x 4.7 cm spiculated cavitary mass left upper lobe consistent with neoplasm. Moderate to severe emphysema noted. Upper Abdomen: Aortic endograft incompletely visualized. Musculoskeletal: Bone windows reveal no worrisome lytic or sclerotic osseous lesions. IMPRESSION: 4.7 cm spiculated cavitary mass left upper lobe consistent with neoplasm. Electronically Signed   By: Misty Stanley M.D.   On: 03/28/2016 17:18   Mr Jeri Cos XT Contrast  Result Date: 03/29/2016 CLINICAL DATA:  66 y/o  M; dizziness with fall.  Lung mass. EXAM: MRI HEAD WITHOUT AND WITH CONTRAST TECHNIQUE: Multiplanar, multiecho pulse sequences of the brain and surrounding structures were obtained without and with intravenous contrast. CONTRAST:  63m MULTIHANCE GADOBENATE DIMEGLUMINE 529 MG/ML IV SOLN COMPARISON:  03/28/2016 CT head. FINDINGS: Brain: No diffusion signal abnormality. No abnormal susceptibility hypointensity. Few foci of T2 FLAIR hyperintensity in periventricular white matter compatible with mild chronic microvascular ischemic changes. There is mild brain parenchymal volume loss. No abnormal enhancement of the brain. No focal mass effect, hydrocephalus, or extra-axial collection. Vascular: Increased T2 signal within the normally enhancing left upper internal jugular vein and sigmoid sinus is  likely due to slow flow. Vascular flow voids are otherwise unremarkable. Skull and upper cervical spine: Normal marrow signal. Sinuses/Orbits: Mild diffuse paranasal sinus mucosal thickening and partial opacification of left posterior ethmoid air cells. No abnormal signal of mastoid air cells. Other: None. IMPRESSION: 1. No acute intracranial abnormality identified. No abnormal enhancement of the brain. 2. Mild chronic microvascular ischemic changes of the brain. 3. Mild paranasal sinus disease. Electronically Signed   By: LKristine GarbeM.D.   On: 03/29/2016 23:56   Ct Abdomen Pelvis W Contrast  Result Date: 03/29/2016 CLINICAL DATA:  5 cm cavitary mass of the left upper lobe. Further assessment for evidence of metastatic disease. EXAM: CT ABDOMEN AND PELVIS WITH CONTRAST TECHNIQUE: Multidetector CT imaging of the abdomen and pelvis was performed using the standard protocol following bolus administration of intravenous contrast. CONTRAST:  1031mISOVUE-300 IOPAMIDOL (ISOVUE-300) INJECTION 61% COMPARISON:  CT of the chest on 03/28/2016. CTA of the abdomen and pelvis on 08/31/2011. FINDINGS: Lower chest: Emphysematous lung disease and bibasilar scarring present. Hepatobiliary: No focal liver abnormality is seen. No gallstones, gallbladder wall thickening, or biliary dilatation. Pancreas: Unremarkable. No pancreatic ductal dilatation or surrounding inflammatory changes. Spleen: Normal in size without focal abnormality. Adrenals/Urinary Tract: Adrenal glands are unremarkable. Kidneys are  normal, without renal calculi, focal lesion, or hydronephrosis. Bladder is unremarkable. Stomach/Bowel: No evidence of bowel obstruction, inflammation or lesion. No free air. Vascular/Lymphatic: Aortic endograft again present showing normal and stable patency and position. There is no evidence of endoleak with further contraction of the aortic sac surrounding the endograft. The excluded sac now measures 2.3 x 2.9 cm  (previously 3.4 x 3.6 cm). No enlarged lymph nodes identified. Musculoskeletal: No acute or significant osseous findings. IMPRESSION: 1. No evidence of metastatic disease in the abdomen or pelvis. 2. Further decrease in size of excluded abdominal aortic aneurysm sac surrounding a normally patent endograft. Electronically Signed   By: Irish Lack M.D.   On: 03/29/2016 15:04   Dg Chest Port 1 View  Result Date: 04/12/2016 CLINICAL DATA:  Bronchoscopy.  Biopsy . EXAM: PORTABLE CHEST 1 VIEW COMPARISON:  CT 03/28/2016 .  Chest x-ray 03/28/2016 . FINDINGS: Mediastinum and hilar structures are normal. Ill-defined cavitary density noted in the left upper lobe. Mild infiltrate left upper lobe. Prior CABG. Heart size normal. No pleural effusion or pneumothorax. No acute bony abnormality. IMPRESSION: 1. Ill-defined cavitary mass again noted in the left upper lobe. This is unchanged. 2. Mild left upper lobe pulmonary infiltrates .  No pneumothorax. Electronically Signed   By: Maisie Fus  Register   On: 04/12/2016 09:24   Path: Diagnosis Lung, transbronchial biopsy, Left Upper Lobe - INVASIVE SQUAMOUS CELL CARCINOMA. - SEE COMMENT. Microscopic Comment As sampled, the tumor appears poorly differentiated. Dr. Colonel Bald has seen this case in consultation with agreement. The findings are called to Dr. Kendrick Fries on 04/13/16. The tumor will be sent for PDL-1 [facility]) testing, as requested by Dr. Kendrick Fries. There is likely sufficient material for additional molecular studies which can be performed upon request. (RAH:gt, 04/13/16) Zandra Abts MD Pathologist, Electronic Signature (Case signed 04/13/2016)    I have independently reviewed the above radiologic studies.  Recent Lab Findings: Lab Results  Component Value Date   WBC 7.2 05/02/2016   HGB 13.0 05/02/2016   HCT 38.9 (L) 05/02/2016   PLT 248.0 05/02/2016   GLUCOSE 80 05/02/2016   CHOL 204 (H) 05/02/2016   TRIG 90.0 05/02/2016   HDL 63.10 05/02/2016    LDLDIRECT 145.6 10/14/2012   LDLCALC 122 (H) 05/02/2016   ALT 14 05/02/2016   AST 20 05/02/2016   NA 137 05/02/2016   K 4.1 05/02/2016   CL 102 05/02/2016   CREATININE 1.04 05/02/2016   BUN 17 05/02/2016   CO2 28 05/02/2016   TSH 0.61 05/02/2016   INR 0.98 07/26/2011   HGBA1C (H) 08/02/2008    6.4 (NOTE) The ADA recommends the following therapeutic goal for glycemic control related to Hgb A1c measurement: Goal of therapy: <6.5 Hgb A1c  Reference: American Diabetes Association: Clinical Practice Recommendations 2010, Diabetes Care, 2010, 33: (Suppl  1).   Previous CABG:  08/03/2008  OPERATIVE REPORT  PREOPERATIVE DIAGNOSES:  Recent lateral wall myocardial infarction with  three-vessel coronary occlusive disease.  POSTOPERATIVE DIAGNOSES:  Recent lateral wall myocardial infarction with  three-vessel coronary occlusive disease.  SURGICAL PROCEDURES:  Coronary artery bypass grafting x5 with left  internal mammary to the left anterior descending coronary artery,  reverse saphenous vein graft to the first diagonal coronary artery,  sequential reverse saphenous vein graft to the first obtuse marginal and  distal circumflex, reverse saphenous vein graft to the posterior  descending coronary artery with attempted endovein harvest.  SURGEON:  Sheliah Plane, MD Study Highlights   04/06/2015  Nuclear stress  EF: 43%.  The left ventricular ejection fraction is moderately decreased (30-44%).  There was no ST segment deviation noted during stress.  Defect 1: There is a medium defect of moderate severity.  Findings consistent with prior myocardial infarction with peri-infarct ischemia.  This is an intermediate risk study.   There is a scar in the basal inferior, inferolateral, mid and apical inferior walls with minimal ischemia in the mid inferolateral wall (SDS = 2).     FEV2.18  59% DLCO12.2 30% Interpretation: The FEV1, FEV1/FVC ratio and FEF25-75% are reduced indicating  airway obstruction TLC, RV, FRC and RV/TLC ratio are all increased indicating overinflation and air trapping. Following administration of bronchodilators, there is a slight response. The reduced diffusing capacity indicates a severe loss of functional alveolar capillary surface. However, the diffusing capacity was not corrected for the patient's hemoglobin. Pulmonary Function Diagnosis: Moderately severe Obstructive Airways Disease -Emphysematous Component with slight response to bronchodilators   Assessment / Plan:   #1 squamous cell carcinoma left upper lobe-  #2 known significant LV dysfunction with ejection fraction of 30-44%, and evidence of pulmonary hypertension by echo #3 chronic obstructive pulmonary disease with severe diffusion capacity deficit at rest #4 known aneurysmal disease with mildly dilated ascending aorta, previous abdominal aneurysm repaired with stent graft 2013 #5 known coronary occlusive disease status post coronary artery bypass grafting times 07/29/2008  Cancer Staging Lung cancer [facility]) Staging form: Lung, AJCC 8th Edition - Clinical stage from 05/03/2016: Stage IIB (cT2b, cN1, cM0) - Signed by Grace Isaac, MD on 05/03/2016  With the patient's ischemic cardiomyopathy, evidence of pulmonary hypertension, depressed ejection fraction, severe diffusion capacity on PFTs 30% I think the patient's operative risk would be preventively high. I discussed this with he and his sister will plan referral to medical oncology and radiation oncology.    Grace Isaac MD      [address] [place] [address] Office [phone]   Beeper [phone]  05/03/2016 4:14 PM

## 2016-05-04 ENCOUNTER — Telehealth: Payer: Self-pay | Admitting: *Deleted

## 2016-05-04 NOTE — Telephone Encounter (Signed)
Oncology Nurse Navigator Documentation  Oncology Nurse Navigator Flowsheets 05/04/2016  Navigator Location [facility]  Referral date to RadOnc/MedOnc 05/03/2016  Navigator Encounter Type Telephone/I received referral on Mr. Fenlon yesterday.  I called today and gave an appt for [place] on 05/10/16 to see Rad Onc arrive at 1:45.    Telephone Outgoing Call  Treatment Phase Pre-Tx/Tx Discussion  Barriers/Navigation Needs Coordination of Care  Interventions Coordination of Care  Coordination of Care Appts  Acuity Level 1  Time Spent with Patient 15

## 2016-05-06 NOTE — Assessment & Plan Note (Signed)
stable overall by history and exam, recent data reviewed with pt, and pt to continue medical treatment as before,  to f/u any worsening symptoms or concerns, declines statin for now Lab Results  Component Value Date   LDLCALC 122 (H) 05/02/2016

## 2016-05-06 NOTE — Assessment & Plan Note (Signed)
stable overall by history and exam, recent data reviewed with pt, and pt to continue medical treatment as before,  to f/u any worsening symptoms or concerns BP Readings from Last 3 Encounters:  05/03/16 124/81  05/02/16 128/88  04/17/16 (!) 160/88

## 2016-05-06 NOTE — Assessment & Plan Note (Addendum)
Asympt, for f/u psa, consider urology f/u,  to f/u any worsening symptoms or concerns Lab Results  Component Value Date   PSA 1.75 05/02/2016   PSA 1.58 05/28/2014   PSA 1.11 10/14/2012

## 2016-05-08 ENCOUNTER — Telehealth: Payer: Self-pay | Admitting: *Deleted

## 2016-05-08 NOTE — Telephone Encounter (Signed)
Mailed [employer] packet to pt on 05/07/16.

## 2016-05-09 ENCOUNTER — Telehealth: Payer: Self-pay | Admitting: *Deleted

## 2016-05-09 NOTE — Telephone Encounter (Signed)
Called pt and confirmed 05/10/16 clinic appt w/ his sister [name].

## 2016-05-10 ENCOUNTER — Ambulatory Visit
Admission: RE | Admit: 2016-05-10 | Discharge: 2016-05-10 | Disposition: A | Discharge status: Home / Self Care | Payer: Medicare Other | Source: Ambulatory Visit | Attending: Radiation Oncology | Admitting: Radiation Oncology

## 2016-05-10 ENCOUNTER — Encounter: Payer: Self-pay | Admitting: *Deleted

## 2016-05-10 ENCOUNTER — Ambulatory Visit: Payer: Medicare Other | Attending: Radiation Oncology | Admitting: Physical Therapy

## 2016-05-10 VITALS — BP 113/73 | HR 89 | Temp 98.6°F | Resp 16

## 2016-05-10 DIAGNOSIS — C3412 Malignant neoplasm of upper lobe, left bronchus or lung: Secondary | ICD-10-CM | POA: Diagnosis not present

## 2016-05-10 DIAGNOSIS — R293 Abnormal posture: Secondary | ICD-10-CM | POA: Diagnosis not present

## 2016-05-10 DIAGNOSIS — F17210 Nicotine dependence, cigarettes, uncomplicated: Secondary | ICD-10-CM | POA: Diagnosis not present

## 2016-05-10 DIAGNOSIS — M6281 Muscle weakness (generalized): Secondary | ICD-10-CM | POA: Insufficient documentation

## 2016-05-10 DIAGNOSIS — R918 Other nonspecific abnormal finding of lung field: Secondary | ICD-10-CM

## 2016-05-10 LAB — FUNGUS CULTURE WITH STAIN

## 2016-05-10 LAB — FUNGAL ORGANISM REFLEX

## 2016-05-10 LAB — FUNGUS CULTURE RESULT

## 2016-05-10 NOTE — Progress Notes (Signed)
Oncology Nurse Navigator Documentation  Oncology Nurse Navigator Flowsheets 05/10/2016  Navigator Location [facility]  Navigator Encounter Type Clinic/MDC/I spoke with patient and sister today.  I gave and explained information on lung cancer, support and resources, and next steps.  Per cancer conference discussion, patient needed an appt with Dr. Julien Nordmann. I arranged and updated patient.   Abnormal Finding Date 03/28/2016  Confirmed Diagnosis Date 04/12/2016  Multidisiplinary Clinic Date 05/10/2016  Treatment Initiated Date 05/15/2016  Patient Visit Type MedOnc  Treatment Phase Pre-Tx/Tx Discussion  Barriers/Navigation Needs Education;Coordination of Care  Education Newly Diagnosed Cancer Education  Interventions Education;Coordination of Care  Coordination of Care Appts  Education Method Written;Verbal  Acuity Level 2  Acuity Level 2 Educational needs;Other  Time Spent with Patient 45

## 2016-05-10 NOTE — Progress Notes (Signed)
Radiation Oncology         [phone] ________________________________  Initial Outpatient Consultation  Name: [name] MRN: [id number]  Date: 05/10/2016  DOB: 16-Jun-1950  XB:[id number] Jenny Reichmann, MD  Grace Isaac, MD   REFERRING PHYSICIAN: Grace Isaac, MD  DIAGNOSIS: Stage IIB (cT2b, cN1/N2, cM0) squamous cell carcinoma of the left upper lobe of lung  HISTORY OF PRESENT ILLNESS::[name] is a 66 y.o. male who is seen in the multidisciplinary lung clinic today out of the courtesy of Dr. Ceasar Mons following a recent diagnosis of squamous cell carcinoma of the left upper lobe of lung. The patient recently had a syncopal episode at home, and for evaluation of this he was taken to the emergency room by EMS. Chest x-ray showed left upper lobe lung mass, confirmed on CT scan. Previous x-rays in 2010 and 2013 showed no evidence of lung mass. Unfortunately the patient has significant underlying pulmonary disease.  Patient underwent transbronchial biopsy which revealed invasive squamous cell carcinoma  The patient consulted with Dr. Servando Snare on 05/03/16 to discuss possible resection of the malignancy. Per Dr. Servando Snare, the patient is not a surgery candidate at this time.  On review of systems, the patient reports some fatigue and shortness of breath with exertion.   PREVIOUS RADIATION THERAPY: No  PAST MEDICAL HISTORY:  has a past medical history of AAA (abdominal aortic aneurysm) ([place]); Abdominal aortic aneurysm (HCC); BPH (benign prostatic hyperplasia); CAD (coronary artery disease); Chronic mental illness (04/21/2011); COPD (chronic obstructive pulmonary disease) ([place]) (04/21/2011); Ejection fraction; Hyperlipidemia; Hyperplastic colon polyp (04/21/2011); Hypertension; Preop cardiovascular exam; Pulmonary nodule; PVD (peripheral vascular disease) ([place]) (04/21/2011); Retention of urine; Shortness of breath; and Tobacco abuse.    PAST SURGICAL HISTORY: Past Surgical History:  Procedure  Laterality Date  . ABDOMINAL AORTA STENT  07/2011  . ABDOMINAL AORTAGRAM N/A 07/26/2011   Procedure: ABDOMINAL Maxcine Ham;  Surgeon: Conrad Sasser, MD;  [facility];  Service: Cardiovascular;  Laterality: N/A;  . ABDOMINAL AORTIC ANEURYSM REPAIR  07/2011  . CORONARY ARTERY BYPASS GRAFT  07/2008   CABG X5/notes 07/12/2010  . EMBOLIZATION Left 07/26/2011   Procedure: EMBOLIZATION;  Surgeon: Conrad [place]town, MD;  Location: [facility] CATH LAB;  Service: Cardiovascular;  Laterality: Left;  . TRANSURETHRAL RESECTION OF PROSTATE  11/29/2011   Procedure: TRANSURETHRAL RESECTION OF THE PROSTATE WITH GYRUS INSTRUMENTS;  Surgeon: Malka So, MD;  Location: [facility] ORS;  Service: Urology;  Laterality: N/A;  Prostate Ultrasound, and Biopsy  . VIDEO BRONCHOSCOPY Bilateral 04/12/2016   Procedure: VIDEO BRONCHOSCOPY WITH FLUORO;  Surgeon: Juanito Doom, MD;  Location: [facility];  Service: Cardiopulmonary;  Laterality: Bilateral;    FAMILY HISTORY: family history includes Arthritis in his sister; Cancer in his father; Diabetes in his father; Heart attack (age of onset: 65) in his father; Heart disease in his father; Hyperlipidemia in his sister; Hypertension in his sister.  SOCIAL HISTORY:  reports that he has been smoking Cigarettes.  He has a 50.00 pack-year smoking history. He has never used smokeless tobacco. He reports that he does not drink alcohol or use drugs.  ALLERGIES: Ace inhibitors  MEDICATIONS:  Current Outpatient Prescriptions  Medication Sig Dispense Refill  . aspirin EC 81 MG tablet Take 81 mg by mouth daily.    . rosuvastatin (CRESTOR) 40 MG tablet Take 1 tablet (40 mg total) by mouth daily. 30 tablet 3   No current facility-administered medications for this encounter.     REVIEW OF SYSTEMS:  A 15 point review of systems is documented in the electronic medical record. This was obtained by the nursing staff. However, I reviewed this with the patient to discuss relevant findings and make  appropriate changes.  Pertinent items are noted in HPI.   PHYSICAL EXAM:  temperature is 98.6 F (37 C). His blood pressure is 113/73 and his pulse is 89. His respiration is 16 and oxygen saturation is 98%.   General: Alert and oriented, in no acute distress. Accompanied by his sister on evaluation today HEENT: Head is normocephalic. Extraocular movements are intact. Oropharynx is clear. Neck: Neck is supple, no palpable cervical or supraclavicular lymphadenopathy. Heart: Regular in rate and rhythm with no murmurs, rubs, or gallops. Chest: Clear to auscultation bilaterally, with no rhonchi, wheezes, or rales. Abdomen: Soft, nontender, nondistended, with no rigidity or guarding. Extremities: No edema. Lymphatics: see Neck Exam Skin: No concerning lesions. Musculoskeletal: symmetric strength and muscle tone throughout. Neurologic: Cranial nerves II through XII are grossly intact. No obvious focalities.  Coordination is intact. Some speech impediment Psychiatric: Judgment and insight are intact. Affect is appropriate.   ECOG = 1   LABORATORY DATA:  Lab Results  Component Value Date   WBC 7.2 05/02/2016   HGB 13.0 05/02/2016   HCT 38.9 (L) 05/02/2016   MCV 91.6 05/02/2016   PLT 248.0 05/02/2016   NEUTROABS 4.5 05/02/2016   Lab Results  Component Value Date   NA 137 05/02/2016   K 4.1 05/02/2016   CL 102 05/02/2016   CO2 28 05/02/2016   GLUCOSE 80 05/02/2016   CREATININE 1.04 05/02/2016   CALCIUM 9.6 05/02/2016      RADIOGRAPHY: Nm Pet Image Initial (pi) Skull Base To Thigh  Result Date: 04/25/2016 CLINICAL DATA:  Initial treatment strategy for left upper lobe mass. EXAM: NUCLEAR MEDICINE PET SKULL BASE TO THIGH TECHNIQUE: 7.9 mCi F-18 FDG was injected intravenously. Full-ring PET imaging was performed from the skull base to thigh after the radiotracer. CT data was obtained and used for attenuation correction and anatomic localization. FASTING BLOOD GLUCOSE:  Value: 97 mg/dl  COMPARISON:  Multiple exams, including 03/28/2016 and 03/29/2016 FINDINGS: NECK No hypermetabolic lymph nodes in the neck. Activity related to dental disease noted. Physiologic muscular activity along the neck. Physiologic glottic activity. CHEST Centrally necrotic 4.8 by 3.7 cm left upper lobe mass, maximum SUV 19.4, compatible with malignancy or granulomatous process. Small focus of hypermetabolic activity in the AP window/ left hilar region, maximum SUV 7.6, compatible with a small metastatic lymph node. Coronary, aortic arch, and branch vessel atherosclerotic vascular disease. Severe emphysema. Scarring or atelectasis in both lower lobes. Anteriorly in the left lower lobe there is a 5 mm pulmonary nodule which is not measurably hypermetabolic but is below sensitive PET-CT size thresholds. Similarly the 3 mm right lower lobe nodule shown on prior CT chest is below sensitive PET-CT size thresholds. Both of these are stable from 2013 and considered benign. ABDOMEN/PELVIS No abnormal hypermetabolic activity within the liver, pancreas, adrenal glands, or spleen. No hypermetabolic lymph nodes in the abdomen or pelvis. Note is made of infrarenal abdominal aorta with aorta bi-iliac stent graft. Paucity of adipose tissue noted. Aortoiliac atherosclerotic vascular disease. SKELETON Hypermetabolic lesions of the distal right seventh, eighth, ninth, and tenth ribs favor healing fractures especially given the proximity of these lesions to the to each other. These are along the anterolateral portions of the ribs. IMPRESSION: 1. Centrally necrotic 4.8 cm hypermetabolic left upper lobe mass, appearance favors malignancy such  as squamous cell carcinoma. There is a small hypermetabolic AP window/left hilar region lymph node compatible with early nodal metastatic disease. No other metastatic lesions identified. Active granulomatous disease could have a similar appearance but is considered much less likely. The 5 mm pulmonary  nodule in the left lower lobe and 3 mm right lower lobe pulmonary nodule are stable from 2013 and considered benign. 2. Healing distal right seventh, eighth, ninth, and tenth rib fractures are mildly hypermetabolic. Given the proximity of these fractures to each other, I am skeptical of bony metastatic disease in this vicinity. 3. Coronary, aortic arch, and branch vessel atherosclerotic vascular disease. Aortoiliac atherosclerotic vascular disease. Infrarenal abdominal aortic aneurysm with aorta bi-iliac stent graft. 4. Severe emphysema. Electronically Signed   By: Van Clines M.D.   On: 04/25/2016 16:32   Dg Chest Port 1 View  Result Date: 04/12/2016 CLINICAL DATA:  Bronchoscopy.  Biopsy . EXAM: PORTABLE CHEST 1 VIEW COMPARISON:  CT 03/28/2016 .  Chest x-ray 03/28/2016 . FINDINGS: Mediastinum and hilar structures are normal. Ill-defined cavitary density noted in the left upper lobe. Mild infiltrate left upper lobe. Prior CABG. Heart size normal. No pleural effusion or pneumothorax. No acute bony abnormality. IMPRESSION: 1. Ill-defined cavitary mass again noted in the left upper lobe. This is unchanged. 2. Mild left upper lobe pulmonary infiltrates .  No pneumothorax. Electronically Signed   By: Marcello Moores  Register   On: 04/12/2016 09:24   Dg C-arm Bronchoscopy  Result Date: 04/12/2016 C-ARM BRONCHOSCOPY: Fluoroscopy was utilized by the requesting physician.  No radiographic interpretation.      IMPRESSION: This is a 66 y.o. man with locally advanced non small cell lung cancer. He presents with a large cavitary left upper lung mass and hilar versus mediastinal adenopathy. The patient would be a candidate for definitive course of treatment with approximately 6 weeks of radiation therapy along with radiosensitizing chemotherapy. The patient will meet with Dr. Julien Nordmann next week  PLAN: Today, I talked to the patient and family about the findings and work-up thus far.  We discussed the natural history of  lung cancer and general treatment, highlighting the role of radiotherapy in the management.  We discussed the available radiation techniques, and focused on the details of logistics and delivery.  We reviewed the anticipated acute and late sequelae associated with radiation in this setting.  The patient was encouraged to ask questions that I answered to the best of my ability.  The patient would like to proceed with radiation and will be scheduled for CT simulation. A consent form was discussed, signed, and placed in the patient's chart.  The patient is scheduled for CT Simulation and treatment planning on 05/15/16.     ------------------------------------------------  Blair Promise, PhD, MD  This document serves as a record of services personally performed by Gery Pray, MD. It was created on his behalf by Maryla Morrow, a trained medical scribe. The creation of this record is based on the scribe's personal observations and the provider's statements to them. This document has been checked and approved by the attending provider.

## 2016-05-10 NOTE — Therapy (Signed)
[place], [place], [address] Phone: [phone]   Fax:  [phone]  Physical Therapy Evaluation  Patient Details  Name: [name] MRN: [id number] Date of Birth: 08-11-1950 Referring Provider: Dr. Gery Pray  Encounter Date: 05/10/2016      PT End of Session - 05/10/16 1458    Visit Number 1   Number of Visits 1   PT Start Time 2440   PT Stop Time 1440   PT Time Calculation (min) 25 min   Activity Tolerance Patient tolerated treatment well   Behavior During Therapy [facility] for tasks assessed/performed      Past Medical History:  Diagnosis Date  . AAA (abdominal aortic aneurysm) ([place])   . Abdominal aortic aneurysm ([place])    AAA And bilateral common iliac artery aneurysms  . BPH (benign prostatic hyperplasia)   . CAD (coronary artery disease)    Dr Ron Parker @ [facility]   . Chronic mental illness 04/21/2011   Diagnosis unclear,  Family provides good care  . COPD (chronic obstructive pulmonary disease) ([place]) 04/21/2011   smoking  . Ejection fraction    EF 35-40%, echo, May, 2010  . Hyperlipidemia   . Hyperplastic colon polyp 04/21/2011  . Hypertension   . Preop cardiovascular exam    Cardiac clearance for major vascular surgery, May, 2013  . Pulmonary nodule    Chest CT May, 2013, 2 small pulmonary nodules, needs appropriate followup,  this CT was not ordered by the cardiology team  . PVD (peripheral vascular disease) ([place]) 04/21/2011  . Retention of urine   . Shortness of breath   . Tobacco abuse     Past Surgical History:  Procedure Laterality Date  . ABDOMINAL AORTA STENT  07/2011  . ABDOMINAL AORTAGRAM N/A 07/26/2011   Procedure: ABDOMINAL Maxcine Ham;  Surgeon: Conrad Westhampton, MD;  Location: [facility] CATH LAB;  Service: Cardiovascular;  Laterality: N/A;  . ABDOMINAL AORTIC ANEURYSM REPAIR  07/2011  . CORONARY ARTERY BYPASS GRAFT  07/2008   CABG X5/notes 07/12/2010  . EMBOLIZATION Left 07/26/2011   Procedure: EMBOLIZATION;  Surgeon: Conrad Chaffee, MD;  Location[facility] C[facility] LAB;  Service: Cardiovascular;  Laterality: Left;  . TRANSURETHRAL RESECTION OF PROSTATE  11/29/2011   Procedure: TRANSURETHRAL RESECTION OF THE PROSTATE WITH GYRUS INSTRUMENTS;  Surgeon: Malka So, MD;  Location: [facility];  Service: Urology;  Laterality: N/A;  Prostate Ultrasound, and Biopsy  . VIDEO BRONCHOSCOPY Bilateral 04/12/2016   Procedure: VIDEO BRONCHOSCOPY WITH FLUORO;  Surgeon: Juanito Doom, MD;  Location: [place];  Service: Cardiopulmonary;  Laterality: Bilateral;    There were no vitals filed for this visit.       Subjective Assessment - 05/10/16 1446    Subjective Seems to describe some occasional chest pain with coughing.   Patient is accompained by: Family member  sister   Pertinent History Pt. presented to Ed with syncopal episode.  Workup showed left lung mass.  Had CT, brain MR, bronchoscopy, and PET for a diagnosis of left upper lobe invasive squamous cell carcinoma.  Current smoker.  CAD s/p CABG x 5 2010 by Dr. Servando Snare; PVD.   Patient Stated Goals get info from all lung clinic providers   Currently in Pain? No/denies            [facility] PT Assessment - 05/10/16 0001      Assessment   Medical Diagnosis left upper lobe invasive squamous cell carcinoma   Referring Provider Dr. Jeneen Rinks  [place]   Onset Date/Surgical Date 03/28/16   Prior Therapy none     Precautions   Precautions Other (comment)   Precaution Comments cancer precautions; has some sort of mental illness, diagnosis unclear     Restrictions   Weight Bearing Restrictions No     Balance Screen   Has the patient fallen in the past 6 months Yes   How many times? 1  difficult to understand his description of what happened   Has the patient had a decrease in activity level because of a fear of falling?  No   Is the patient reluctant to leave their home because of a fear of falling?  No     Home Environment   Living  Environment Private residence   Living Arrangements Other relatives  sister   Type of [place] One level     Prior Function   Level of Independence Other (comment)  appears to need assistance; lives with sister   Leisure reports he does some walking, but it doesn't sound like this is purposefully for exercise; also, has been walking less lately     Cognition   Overall Cognitive Status Impaired/Different from baseline   Area of Impairment --  unclear, seems like some developmental disability perhaps     Observation/Other Assessments   Observations very tall, slender gentleman; somewhat difficult to understand in conversation     Functional Tests   Functional tests Sit to Stand     Sit to Stand   Comments 8 times in 30 seconds, below average for age  some mild-mod. dyspnea following     Posture/Postural Control   Posture/Postural Control Postural limitations   Postural Limitations Forward head;Rounded Shoulders  very significant forward head     ROM / Strength   AROM / PROM / Strength AROM     AROM   Overall AROM Comments standing trunk AROM WFL all directions except slight limitation in extension     Ambulation/Gait   Ambulation/Gait Yes   Ambulation/Gait Assistance 7: Independent     Balance   Balance Assessed Yes     Dynamic Standing Balance   Dynamic Standing - Comments reaches forward 13 inches in standing, a little below average for his age                           PT Education - 05/10/16 1458    Education provided Yes   Education Details posture, breathing, energy conservation, walking, Cure article on staying active, PT info   Person(s) Educated Patient;Other (comment)  sister   Methods Explanation;Handout   Comprehension Verbalized understanding               Lung Clinic Goals - 05/10/16 1503      Patient will be able to verbalize understanding of the benefit of exercise to decrease fatigue.   Status  Achieved     Patient will be able to verbalize the importance of posture.   Status Achieved     Patient will be able to demonstrate diaphragmatic breathing for improved lung function.   Status Achieved     Patient will be able to verbalize understanding of the role of physical therapy to prevent functional decline and who to contact if physical therapy is needed.   Status Achieved             Plan - 05/10/16 1459    Clinical Impression Statement Patient is  a very pleasant gentleman who has, by his history, a mental illness that is not named.  He is now diagnosed with left upper lobe invasive squamous cell carcinoma.  He is expected to have radiation treatment for this.  He has significant forward head posture and decreased performance on a couple of his assessments today.  Eval is low complex with main personal factor being some type of mental disability.   Rehab Potential Fair   PT Frequency One time visit   PT Treatment/Interventions Patient/family education   PT Next Visit Plan None at this time   PT Home Exercise Plan walking, breathing exercise   Consulted and Agree with Plan of Care Patient;Family member/caregiver      Patient will benefit from skilled therapeutic intervention in order to improve the following deficits and impairments:  Postural dysfunction, Decreased mobility, Cardiopulmonary status limiting activity  Visit Diagnosis: Abnormal posture - Plan: PT plan of care cert/re-cert  Muscle weakness (generalized) - Plan: PT plan of care cert/re-cert      G-Codes - 29/92/42 1503    Functional Assessment Tool Used (Outpatient Only) clinical judgement   Functional Limitation Changing and maintaining body position   Changing and Maintaining Body Position Current Status (A8341) At least 1 percent but less than 20 percent impaired, limited or restricted   Changing and Maintaining Body Position Goal Status (D6222) At least 1 percent but less than 20 percent impaired,  limited or restricted   Changing and Maintaining Body Position Discharge Status (L7989) At least 1 percent but less than 20 percent impaired, limited or restricted       Problem List Patient Active Problem List   Diagnosis Date Noted  . Lung cancer ([place]) 04/13/2016  . Syncope 03/28/2016  . Near syncope 03/28/2016  . Smoker 05/28/2014  . Abdominal aneurysm without mention of rupture 08/31/2011  . Aneurysm of iliac artery ([place]) 08/31/2011  . Preop cardiovascular exam   . Abdominal aortic aneurysm ([place])   . Lung mass   . Hypertension   . Hyperlipidemia   . CAD (coronary artery disease)   . Hx of CABG   . Ejection fraction   . Tobacco abuse   . Iliac artery aneurysm, bilateral ([place]) 06/22/2011  . Fatigue 04/23/2011  . Bladder neck obstruction 04/23/2011  . PSA elevation 04/23/2011  . Preventative health care 04/21/2011  . COPD (chronic obstructive pulmonary disease) ([place]) 04/21/2011  . Hyperplastic colon polyp 04/21/2011  . PVD (peripheral vascular disease) ([place]) 04/21/2011  . Gastritis and duodenitis 04/21/2011  . Chronic mental illness 04/21/2011  . CONSTIPATION 11/12/2008  . CORONARY ARTERY BYPASS GRAFT, HX OF 11/09/2008  . CARDIOMYOPATHY, ISCHEMIC 08/23/2008  . HEMOCCULT POSITIVE STOOL 08/23/2008    [place]IS[place],DONNA 05/10/2016, 3:06 PM  [place], [place], [address] Phone: [phone]   Fax:  [phone]  Name: [name] MRN: [id number] Date of Birth: 10/26/1950  Serafina Royals, PT 05/10/16 3:06 PM

## 2016-05-15 ENCOUNTER — Other Ambulatory Visit: Payer: Medicare Other

## 2016-05-15 ENCOUNTER — Ambulatory Visit
Admission: RE | Admit: 2016-05-15 | Discharge: 2016-05-15 | Disposition: A | Discharge status: Home / Self Care | Payer: Medicare Other | Source: Ambulatory Visit | Attending: Radiation Oncology | Admitting: Radiation Oncology

## 2016-05-15 ENCOUNTER — Encounter: Payer: Self-pay | Admitting: *Deleted

## 2016-05-15 ENCOUNTER — Ambulatory Visit: Payer: Medicare Other | Admitting: Internal Medicine

## 2016-05-15 DIAGNOSIS — Z510 Encounter for antineoplastic radiation therapy: Secondary | ICD-10-CM | POA: Insufficient documentation

## 2016-05-15 DIAGNOSIS — C3412 Malignant neoplasm of upper lobe, left bronchus or lung: Secondary | ICD-10-CM | POA: Diagnosis not present

## 2016-05-15 NOTE — Progress Notes (Signed)
  Radiation Oncology         ([phone]) [phone] ________________________________  Name: [name] MRN: [id number]  Date: 05/15/2016  DOB: Jun 02, 1950  SIMULATION AND TREATMENT PLANNING NOTE    ICD-9-CM ICD-10-CM   1. Malignant neoplasm of upper lobe of left lung (HCC) 162.3 C34.12     DIAGNOSIS:  Stage IIB (cT2b, cN1/N2, cM0) squamous cell carcinoma of the left upper lobe of lung  NARRATIVE:  The patient was brought to the [facility].  Identity was confirmed.  All relevant records and images related to the planned course of therapy were reviewed.  The patient freely provided informed written consent to proceed with treatment after reviewing the details related to the planned course of therapy. The consent form was witnessed and verified by the simulation staff.  Then, the patient was set-up in a stable reproducible  supine position for radiation therapy.  CT images were obtained.  Surface markings were placed.  The CT images were loaded into the planning software.  Then the target and avoidance structures were contoured.  Treatment planning then occurred.  The radiation prescription was entered and confirmed.  Then, I designed and supervised the construction of a total of 6 medically necessary complex treatment devices.  I have requested : 3D Simulation  I have requested a DVH of the following structures: heart, lungs, GTV, PTV, esophagus, spinal cord.  I have ordered:dose calc.  PLAN:  The patient will receive 60 Gy in 30 fractions with concurrent radiosensitizing chemotherapy.    Special Treatment Procedure Note: The patient will be receiving radiosensitizing chemotherapy. Given the potential of increased toxicities related to combined therapy and the necessity for close monitoring of the patient and blood work, this constitutes a special treatment procedure.  -----------------------------------  Blair Promise, PhD, MD  This document serves as a record of services  personally performed by Gery Pray, MD. It was created on his behalf by Darcus Austin, a trained medical scribe. The creation of this record is based on the scribe's personal observations and the provider's statements to them. This document has been checked and approved by the attending provider.

## 2016-05-15 NOTE — Progress Notes (Signed)
Oncology Nurse Navigator Documentation  Oncology Nurse Navigator Flowsheets 05/15/2016  Navigator Location [facility]  Navigator Encounter Type Other/[name] was a no show to see [name] today.  He did come to his Rad Onc appt.  I called Rad Onc to check on his status.  I left vm message with Dr. Clabe Seal nurse.   Treatment Phase Pre-Tx/Tx Discussion  Barriers/Navigation Needs Education  Interventions Education  Acuity Level 2  Time Spent with Patient 15

## 2016-05-21 DIAGNOSIS — Z510 Encounter for antineoplastic radiation therapy: Secondary | ICD-10-CM | POA: Diagnosis not present

## 2016-05-21 DIAGNOSIS — C3412 Malignant neoplasm of upper lobe, left bronchus or lung: Secondary | ICD-10-CM | POA: Diagnosis not present

## 2016-05-22 ENCOUNTER — Encounter: Payer: Self-pay | Admitting: Radiation Oncology

## 2016-05-22 ENCOUNTER — Ambulatory Visit
Admission: RE | Admit: 2016-05-22 | Discharge: 2016-05-22 | Disposition: A | Discharge status: Home / Self Care | Payer: Medicare Other | Source: Ambulatory Visit | Attending: Radiation Oncology | Admitting: Radiation Oncology

## 2016-05-22 ENCOUNTER — Inpatient Hospital Stay
Admission: RE | Admit: 2016-05-22 | Discharge: 2016-05-22 | Disposition: A | Discharge status: Home / Self Care | Payer: Self-pay | Source: Ambulatory Visit | Attending: Radiation Oncology | Admitting: Radiation Oncology

## 2016-05-22 VITALS — BP 130/83 | HR 75 | Temp 97.6°F | Ht 75.0 in | Wt 140.2 lb

## 2016-05-22 DIAGNOSIS — Z510 Encounter for antineoplastic radiation therapy: Secondary | ICD-10-CM | POA: Diagnosis not present

## 2016-05-22 DIAGNOSIS — C3412 Malignant neoplasm of upper lobe, left bronchus or lung: Secondary | ICD-10-CM

## 2016-05-22 MED ORDER — BIAFINE EX EMUL
Freq: Once | CUTANEOUS | Status: AC
  Administered 2016-05-22: 17:00:00 via TOPICAL

## 2016-05-22 NOTE — Progress Notes (Signed)
[name] has completed 1 fraction to his left lung.  He denies having pain.  He reports having a frequent productive cough with "dark brownish" sputum.  He reports seeing some blood in his sputum "a while ago once or twice."  He reports having occasional shortness of breath.  BP 130/83 (BP Location: Left Arm, Patient Position: Sitting)   Pulse 75   Temp 97.6 F (36.4 C) (Oral)   Ht '6\' 3"'$  (1.905 m)   Wt 140 lb 3.2 oz (63.6 kg)   SpO2 100%   BMI 17.52 kg/m    Wt Readings from Last 3 Encounters:  05/22/16 140 lb 3.2 oz (63.6 kg)  05/15/16 140 lb 6.4 oz (63.7 kg)  05/03/16 135 lb (61.2 kg)

## 2016-05-22 NOTE — Progress Notes (Signed)
Pt here for patient teaching.  Pt given Radiation and You booklet and biafine.  Reviewed areas of pertinence such as fatigue, skin changes and throat changes . Pt able to give teach back of applying biafine BID, to pat skin and use unscented/gentle soap,avoid applying anything to skin within 4 hours of treatment. Pt demonstrated understanding and verbalizes understanding of information given and will contact nursing with any questions or concerns.

## 2016-05-22 NOTE — Progress Notes (Signed)
  Radiation Oncology         ([phone] ________________________________  Name: [name] MRN: [id number]  Date: 05/22/2016  DOB: 1950-05-20  Weekly Radiation Therapy Management    ICD-9-CM ICD-10-CM   1. Malignant neoplasm of upper lobe of left lung (HCC) 162.3 C34.12      Current Dose: 2 Gy     Planned Dose:  60 Gy  Narrative . . . . . . . . The patient presents for routine under treatment assessment.     The patient has completed one fraction to the left lung. He denies having pain. He reports a frequent productive cough with "dark brownish" sputum. He reports scant hemoptysis. The patient reports occasional shortness of breath.                                    The patient is without complaint.                                 Set-up films were reviewed.                                 The chart was checked. Physical Findings. . .  height is '6\' 3"'$  (1.905 m) and weight is 140 lb 3.2 oz (63.6 kg). His oral temperature is 97.6 F (36.4 C). His blood pressure is 130/83 and his pulse is 75. His oxygen saturation is 100%.  Weight essentially stable.  No significant changes. Lungs clear to auscultation bilaterally. Heart regular in rate and rhythm. Impression . . . . . . . The patient is tolerating radiation. Plan . . . . . . . . . . . . Continue treatment as planned.  ________________________________   Blair Promise, PhD, MD  This document serves as a record of services personally performed by Gery Pray, MD. It was created on his behalf by Maryla Morrow, a trained medical scribe. The creation of this record is based on the scribe's personal observations and the provider's statements to them. This document has been checked and approved by the attending provider.

## 2016-05-23 ENCOUNTER — Ambulatory Visit
Admission: RE | Admit: 2016-05-23 | Discharge: 2016-05-23 | Disposition: A | Discharge status: Home / Self Care | Payer: Medicare Other | Source: Ambulatory Visit | Attending: Radiation Oncology | Admitting: Radiation Oncology

## 2016-05-23 DIAGNOSIS — Z510 Encounter for antineoplastic radiation therapy: Secondary | ICD-10-CM | POA: Diagnosis not present

## 2016-05-23 DIAGNOSIS — C3412 Malignant neoplasm of upper lobe, left bronchus or lung: Secondary | ICD-10-CM | POA: Diagnosis not present

## 2016-05-24 ENCOUNTER — Ambulatory Visit
Admission: RE | Admit: 2016-05-24 | Discharge: 2016-05-24 | Disposition: A | Discharge status: Home / Self Care | Payer: Medicare Other | Source: Ambulatory Visit | Attending: Radiation Oncology | Admitting: Radiation Oncology

## 2016-05-24 DIAGNOSIS — Z510 Encounter for antineoplastic radiation therapy: Secondary | ICD-10-CM | POA: Diagnosis not present

## 2016-05-24 DIAGNOSIS — C3412 Malignant neoplasm of upper lobe, left bronchus or lung: Secondary | ICD-10-CM | POA: Diagnosis not present

## 2016-05-25 ENCOUNTER — Ambulatory Visit
Admission: RE | Admit: 2016-05-25 | Discharge: 2016-05-25 | Disposition: A | Discharge status: Home / Self Care | Payer: Medicare Other | Source: Ambulatory Visit | Attending: Radiation Oncology | Admitting: Radiation Oncology

## 2016-05-25 DIAGNOSIS — Z510 Encounter for antineoplastic radiation therapy: Secondary | ICD-10-CM | POA: Diagnosis not present

## 2016-05-25 DIAGNOSIS — C3412 Malignant neoplasm of upper lobe, left bronchus or lung: Secondary | ICD-10-CM | POA: Diagnosis not present

## 2016-05-25 LAB — ACID FAST CULTURE WITH REFLEXED SENSITIVITIES (MYCOBACTERIA)

## 2016-05-25 LAB — ACID FAST CULTURE WITH REFLEXED SENSITIVITIES: ACID FAST CULTURE - AFSCU3: NEGATIVE

## 2016-05-28 ENCOUNTER — Ambulatory Visit
Admission: RE | Admit: 2016-05-28 | Discharge: 2016-05-28 | Disposition: A | Discharge status: Home / Self Care | Payer: Medicare Other | Source: Ambulatory Visit | Attending: Radiation Oncology | Admitting: Radiation Oncology

## 2016-05-28 DIAGNOSIS — Z510 Encounter for antineoplastic radiation therapy: Secondary | ICD-10-CM | POA: Diagnosis not present

## 2016-05-28 DIAGNOSIS — C3412 Malignant neoplasm of upper lobe, left bronchus or lung: Secondary | ICD-10-CM | POA: Diagnosis not present

## 2016-05-29 ENCOUNTER — Ambulatory Visit
Admission: RE | Admit: 2016-05-29 | Discharge: 2016-05-29 | Disposition: A | Discharge status: Home / Self Care | Payer: Medicare Other | Source: Ambulatory Visit | Attending: Radiation Oncology | Admitting: Radiation Oncology

## 2016-05-29 VITALS — BP 126/78 | HR 74 | Temp 97.9°F | Resp 20 | Wt 138.0 lb

## 2016-05-29 DIAGNOSIS — C3412 Malignant neoplasm of upper lobe, left bronchus or lung: Secondary | ICD-10-CM

## 2016-05-29 DIAGNOSIS — Z510 Encounter for antineoplastic radiation therapy: Secondary | ICD-10-CM | POA: Diagnosis not present

## 2016-05-29 NOTE — Progress Notes (Signed)
  Radiation Oncology         [phone]) [phone] ________________________________  Name: [name] MRN: [id number]  Date: 05/29/2016  DOB: May 08, 1950  Weekly Radiation Therapy Management    ICD-9-CM ICD-10-CM   1. Malignant neoplasm of upper lobe of left lung (HCC) 162.3 C34.12      Current Dose: 12 Gy     Planned Dose:  60 Gy  Narrative . . . . . . . . The patient presents for routine under treatment assessment.     The patient has completed six fractions to the left lung. He denies pain and states his energy level is stable. Pt endorses a productive cough that produces a yellow/brownish sputum. He states this is not new for him. Pt denies blood in sputum, SOB, difficulty swallowing, or appetite changes. He states he has biafine cream but has yet to use this.                                     The patient is without complaint.                                 Set-up films were reviewed.                                 The chart was checked. Physical Findings. . .  weight is 138 lb (62.6 kg). His oral temperature is 97.9 F (36.6 C). His blood pressure is 126/78 and his pulse is 74. His respiration is 20 and oxygen saturation is 96%.  Weight essentially stable.  No significant changes. Lungs clear to auscultation bilaterally. Heart regular in rate and rhythm.  Impression . . . . . . . The patient is tolerating radiation. Plan . . . . . . . . . . . . Continue treatment as planned.   ________________________________   Blair Promise, PhD, MD  This document serves as a record of services personally performed by Gery Pray, MD. It was created on his behalf by Maryla Morrow, a trained medical scribe. The creation of this record is based on the scribe's personal observations and the provider's statements to them. This document has been checked and approved by the attending provider.

## 2016-05-29 NOTE — Progress Notes (Addendum)
[name] completed 6th fraction to left lung.  Patient denies any pain.  Patient states energy level is the same.  Patient states he has a productive cough that has brownish sputum. Denies any blood in sputum.  Denies any shortness of breath.  Denies any issues with swallowing.   Patient denies any issues with appetite.  He says he has biafine cream but has not used as of yet.     Vitals:   05/29/16 1017  BP: 126/78  Pulse: 74  Resp: 20  Temp: 97.9 F (36.6 C)  TempSrc: Oral  SpO2: 96%  Weight: 138 lb (62.6 kg)    Wt Readings from Last 3 Encounters:  05/29/16 138 lb (62.6 kg)  05/22/16 140 lb 3.2 oz (63.6 kg)  05/15/16 140 lb 6.4 oz (63.7 kg)

## 2016-05-30 ENCOUNTER — Ambulatory Visit
Admission: RE | Admit: 2016-05-30 | Discharge: 2016-05-30 | Disposition: A | Discharge status: Home / Self Care | Payer: Medicare Other | Source: Ambulatory Visit | Attending: Radiation Oncology | Admitting: Radiation Oncology

## 2016-05-30 DIAGNOSIS — Z510 Encounter for antineoplastic radiation therapy: Secondary | ICD-10-CM | POA: Diagnosis not present

## 2016-05-30 DIAGNOSIS — C3412 Malignant neoplasm of upper lobe, left bronchus or lung: Secondary | ICD-10-CM | POA: Diagnosis not present

## 2016-05-31 ENCOUNTER — Ambulatory Visit
Admission: RE | Admit: 2016-05-31 | Discharge: 2016-05-31 | Disposition: A | Discharge status: Home / Self Care | Payer: Medicare Other | Source: Ambulatory Visit | Attending: Radiation Oncology | Admitting: Radiation Oncology

## 2016-05-31 DIAGNOSIS — C3412 Malignant neoplasm of upper lobe, left bronchus or lung: Secondary | ICD-10-CM | POA: Diagnosis not present

## 2016-05-31 DIAGNOSIS — Z510 Encounter for antineoplastic radiation therapy: Secondary | ICD-10-CM | POA: Diagnosis not present

## 2016-06-01 ENCOUNTER — Telehealth: Payer: Self-pay | Admitting: *Deleted

## 2016-06-01 ENCOUNTER — Ambulatory Visit
Admission: RE | Admit: 2016-06-01 | Discharge: 2016-06-01 | Disposition: A | Discharge status: Home / Self Care | Payer: Medicare Other | Source: Ambulatory Visit | Attending: Radiation Oncology | Admitting: Radiation Oncology

## 2016-06-01 DIAGNOSIS — Z510 Encounter for antineoplastic radiation therapy: Secondary | ICD-10-CM | POA: Diagnosis not present

## 2016-06-01 DIAGNOSIS — C3412 Malignant neoplasm of upper lobe, left bronchus or lung: Secondary | ICD-10-CM | POA: Diagnosis not present

## 2016-06-01 NOTE — Telephone Encounter (Signed)
Oncology Nurse Navigator Documentation  Oncology Nurse Navigator Flowsheets 06/01/2016  Navigator Location [facility]  Navigator Encounter Type Telephone/I called to follow up on how [name] was doing with treatment.  I spoke with his sister.  She states he is doing well.  I also asked about [name]'s smoking cessation.  She states he is still smoking.  I asked if I could send some information about smoking cessation with my business card so he can call to get some help.  She asked that I send that to her.  I will put this information in the mail today.  She also asked about CSW phone number.  I gave that to her.  I will also place Lauren's business card in the mail with the other information.   Telephone Outgoing Call  Treatment Phase Treatment  Barriers/Navigation Needs Education  Education Smoking cessation  Interventions Education  Acuity Level 2  Acuity Level 2 Educational needs  Time Spent with Patient 30

## 2016-06-04 ENCOUNTER — Ambulatory Visit
Admission: RE | Admit: 2016-06-04 | Discharge: 2016-06-04 | Disposition: A | Discharge status: Home / Self Care | Payer: Medicare Other | Source: Ambulatory Visit | Attending: Radiation Oncology | Admitting: Radiation Oncology

## 2016-06-04 DIAGNOSIS — Z510 Encounter for antineoplastic radiation therapy: Secondary | ICD-10-CM | POA: Diagnosis not present

## 2016-06-04 DIAGNOSIS — C3412 Malignant neoplasm of upper lobe, left bronchus or lung: Secondary | ICD-10-CM | POA: Diagnosis not present

## 2016-06-05 ENCOUNTER — Ambulatory Visit
Admission: RE | Admit: 2016-06-05 | Discharge: 2016-06-05 | Disposition: A | Discharge status: Home / Self Care | Payer: Medicare Other | Source: Ambulatory Visit | Attending: Radiation Oncology | Admitting: Radiation Oncology

## 2016-06-05 ENCOUNTER — Encounter: Payer: Self-pay | Admitting: Radiation Oncology

## 2016-06-05 VITALS — BP 101/78 | HR 78 | Temp 97.7°F | Ht 75.0 in | Wt 135.4 lb

## 2016-06-05 DIAGNOSIS — C3412 Malignant neoplasm of upper lobe, left bronchus or lung: Secondary | ICD-10-CM | POA: Diagnosis not present

## 2016-06-05 DIAGNOSIS — Z510 Encounter for antineoplastic radiation therapy: Secondary | ICD-10-CM | POA: Diagnosis not present

## 2016-06-05 NOTE — Progress Notes (Signed)
  Radiation Oncology         (336) [phone] ________________________________  Name: [name]: [id number]  Date: 06/05/2016  DOB: 26-Dec-1950  Weekly Radiation Therapy Management    ICD-9-CM ICD-10-CM   1. Malignant neoplasm of upper lobe of left lung (HCC) 162.3 C34.12      Current Dose: 20 Gy     Planned Dose:  60 Gy  Narrative . . . . . . . . The patient presents for routine under treatment assessment.     [name] has completed 10 fractions to his left lung.  He denies having pain.  He denies difficulty swallowing. He has occasional shortness of breath. He reports having a frequent cough with red/brown sputum.  He denies having fatigue. The nurse notes skin on his left chest and back is intact.  He has lost 3 lbs from last week.                                  Set-up films were reviewed.                                 The chart was checked. Physical Findings. . .  height is '6\' 3"'$  (1.905 m) and weight is 135 lb 6.4 oz (61.4 kg). His oral temperature is 97.7 F (36.5 C). His blood pressure is 101/78 and his pulse is 78. His oxygen saturation is 100%.  No significant changes. Lungs clear to auscultation bilaterally. Heart regular in rate and rhythm.  Impression . . . . . . . The patient is tolerating radiation. Plan . . . . . . . . . . . . Continue treatment as planned.   ________________________________   Blair Promise, PhD, MD  This document serves as a record of services personally performed by Gery Pray, MD. It was created on his behalf by Darcus Austin, a trained medical scribe. The creation of this record is based on the scribe's personal observations and the provider's statements to them. This document has been checked and approved by the attending provider.

## 2016-06-05 NOTE — Progress Notes (Signed)
[name] had completed 10 fractions to his left lung.  He denies having pain.  He reports that food sometimes feels like it is stuck in his chest.  He has occasional shortness of breath.  He reports having a frequent cough with red/brown sputum.  He denies having fatigue.  The skin on his left chest and back is intact.  He has lost 3 lbs from last week.  BP 101/78 (BP Location: Right Arm, Patient Position: Sitting)   Pulse 78   Temp 97.7 F (36.5 C) (Oral)   Ht '6\' 3"'$  (1.905 m)   Wt 135 lb 6.4 oz (61.4 kg)   SpO2 100%   BMI 16.92 kg/m    Wt Readings from Last 3 Encounters:  06/05/16 135 lb 6.4 oz (61.4 kg)  05/29/16 138 lb (62.6 kg)  05/22/16 140 lb 3.2 oz (63.6 kg)

## 2016-06-06 ENCOUNTER — Ambulatory Visit
Admission: RE | Admit: 2016-06-06 | Discharge: 2016-06-06 | Disposition: A | Discharge status: Home / Self Care | Payer: Medicare Other | Source: Ambulatory Visit | Attending: Radiation Oncology | Admitting: Radiation Oncology

## 2016-06-06 DIAGNOSIS — C3412 Malignant neoplasm of upper lobe, left bronchus or lung: Secondary | ICD-10-CM | POA: Diagnosis not present

## 2016-06-06 DIAGNOSIS — Z510 Encounter for antineoplastic radiation therapy: Secondary | ICD-10-CM | POA: Diagnosis not present

## 2016-06-07 ENCOUNTER — Ambulatory Visit
Admission: RE | Admit: 2016-06-07 | Discharge: 2016-06-07 | Disposition: A | Discharge status: Home / Self Care | Payer: Medicare Other | Source: Ambulatory Visit | Attending: Radiation Oncology | Admitting: Radiation Oncology

## 2016-06-07 DIAGNOSIS — C3412 Malignant neoplasm of upper lobe, left bronchus or lung: Secondary | ICD-10-CM | POA: Diagnosis not present

## 2016-06-07 DIAGNOSIS — Z510 Encounter for antineoplastic radiation therapy: Secondary | ICD-10-CM | POA: Diagnosis not present

## 2016-06-08 ENCOUNTER — Ambulatory Visit
Admission: RE | Admit: 2016-06-08 | Discharge: 2016-06-08 | Disposition: A | Discharge status: Home / Self Care | Payer: Medicare Other | Source: Ambulatory Visit | Attending: Radiation Oncology | Admitting: Radiation Oncology

## 2016-06-08 DIAGNOSIS — C3412 Malignant neoplasm of upper lobe, left bronchus or lung: Secondary | ICD-10-CM | POA: Diagnosis not present

## 2016-06-08 DIAGNOSIS — Z510 Encounter for antineoplastic radiation therapy: Secondary | ICD-10-CM | POA: Diagnosis not present

## 2016-06-11 ENCOUNTER — Ambulatory Visit
Admission: RE | Admit: 2016-06-11 | Discharge: 2016-06-11 | Disposition: A | Discharge status: Home / Self Care | Payer: Medicare Other | Source: Ambulatory Visit | Attending: Radiation Oncology | Admitting: Radiation Oncology

## 2016-06-11 DIAGNOSIS — Z510 Encounter for antineoplastic radiation therapy: Secondary | ICD-10-CM | POA: Diagnosis not present

## 2016-06-11 DIAGNOSIS — C3412 Malignant neoplasm of upper lobe, left bronchus or lung: Secondary | ICD-10-CM | POA: Diagnosis not present

## 2016-06-12 ENCOUNTER — Ambulatory Visit
Admission: RE | Admit: 2016-06-12 | Discharge: 2016-06-12 | Disposition: A | Discharge status: Home / Self Care | Payer: Medicare Other | Source: Ambulatory Visit | Attending: Radiation Oncology | Admitting: Radiation Oncology

## 2016-06-12 ENCOUNTER — Ambulatory Visit: Admission: RE | Admit: 2016-06-12 | Payer: Medicare Other | Source: Ambulatory Visit | Admitting: Radiation Oncology

## 2016-06-12 ENCOUNTER — Telehealth: Payer: Self-pay | Admitting: *Deleted

## 2016-06-12 DIAGNOSIS — Z510 Encounter for antineoplastic radiation therapy: Secondary | ICD-10-CM | POA: Diagnosis not present

## 2016-06-12 DIAGNOSIS — C3412 Malignant neoplasm of upper lobe, left bronchus or lung: Secondary | ICD-10-CM | POA: Diagnosis not present

## 2016-06-12 NOTE — Telephone Encounter (Signed)
Oncology Nurse Navigator Documentation  Oncology Nurse Navigator Flowsheets 06/12/2016  Navigator Location [facility]  Navigator Encounter Type Telephone/Dr. Sondra Come requested an appt with Dr. Julien Nordmann ASAP.  I called and spoke with his sister.  He will be seen tomorrow at 2:00.  She verbalized understanding of appt time and place.   Telephone Outgoing Call  Treatment Phase Treatment  Barriers/Navigation Needs Coordination of Care  Interventions Coordination of Care  Acuity Level 1  Time Spent with Patient 15

## 2016-06-13 ENCOUNTER — Ambulatory Visit (HOSPITAL_BASED_OUTPATIENT_CLINIC_OR_DEPARTMENT_OTHER): Payer: Medicare Other | Admitting: Internal Medicine

## 2016-06-13 ENCOUNTER — Encounter (INDEPENDENT_AMBULATORY_CARE_PROVIDER_SITE_OTHER): Payer: Self-pay

## 2016-06-13 ENCOUNTER — Encounter: Payer: Self-pay | Admitting: Internal Medicine

## 2016-06-13 ENCOUNTER — Other Ambulatory Visit (HOSPITAL_BASED_OUTPATIENT_CLINIC_OR_DEPARTMENT_OTHER): Payer: Medicare Other

## 2016-06-13 ENCOUNTER — Ambulatory Visit
Admission: RE | Admit: 2016-06-13 | Discharge: 2016-06-13 | Disposition: A | Discharge status: Home / Self Care | Payer: Medicare Other | Source: Ambulatory Visit | Attending: Radiation Oncology | Admitting: Radiation Oncology

## 2016-06-13 VITALS — BP 119/81 | HR 72 | Temp 98.4°F | Resp 19 | Wt 133.4 lb

## 2016-06-13 DIAGNOSIS — C3412 Malignant neoplasm of upper lobe, left bronchus or lung: Secondary | ICD-10-CM

## 2016-06-13 DIAGNOSIS — C3492 Malignant neoplasm of unspecified part of left bronchus or lung: Secondary | ICD-10-CM

## 2016-06-13 DIAGNOSIS — E785 Hyperlipidemia, unspecified: Secondary | ICD-10-CM

## 2016-06-13 DIAGNOSIS — Z7189 Other specified counseling: Secondary | ICD-10-CM

## 2016-06-13 DIAGNOSIS — Z5111 Encounter for antineoplastic chemotherapy: Secondary | ICD-10-CM

## 2016-06-13 DIAGNOSIS — Z510 Encounter for antineoplastic radiation therapy: Secondary | ICD-10-CM | POA: Diagnosis not present

## 2016-06-13 HISTORY — DX: Malignant neoplasm of unspecified part of left bronchus or lung: C34.92

## 2016-06-13 LAB — CBC WITH DIFFERENTIAL/PLATELET
BASO%: 0.3 % (ref 0.0–2.0)
Basophils Absolute: 0 10*3/uL (ref 0.0–0.1)
EOS%: 0.9 % (ref 0.0–7.0)
Eosinophils Absolute: 0.1 10*3/uL (ref 0.0–0.5)
HEMATOCRIT: 36.3 % — AB (ref 38.4–49.9)
HGB: 11.7 g/dL — ABNORMAL LOW (ref 13.0–17.1)
LYMPH%: 12.2 % — AB (ref 14.0–49.0)
MCH: 29.2 pg (ref 27.2–33.4)
MCHC: 32.2 g/dL (ref 32.0–36.0)
MCV: 90.5 fL (ref 79.3–98.0)
MONO#: 0.3 10*3/uL (ref 0.1–0.9)
MONO%: 4.1 % (ref 0.0–14.0)
NEUT#: 5.3 10*3/uL (ref 1.5–6.5)
NEUT%: 82.5 % — AB (ref 39.0–75.0)
Platelets: 196 10*3/uL (ref 140–400)
RBC: 4.01 10*6/uL — ABNORMAL LOW (ref 4.20–5.82)
RDW: 14.2 % (ref 11.0–14.6)
WBC: 6.4 10*3/uL (ref 4.0–10.3)
lymph#: 0.8 10*3/uL — ABNORMAL LOW (ref 0.9–3.3)
nRBC: 0 % (ref 0–0)

## 2016-06-13 LAB — COMPREHENSIVE METABOLIC PANEL
ALT: 12 U/L (ref 0–55)
AST: 15 U/L (ref 5–34)
Albumin: 2.9 g/dL — ABNORMAL LOW (ref 3.5–5.0)
Alkaline Phosphatase: 107 U/L (ref 40–150)
Anion Gap: 10 mEq/L (ref 3–11)
BUN: 13.8 mg/dL (ref 7.0–26.0)
CALCIUM: 9.3 mg/dL (ref 8.4–10.4)
CHLORIDE: 103 meq/L (ref 98–109)
CO2: 26 mEq/L (ref 22–29)
Creatinine: 1.1 mg/dL (ref 0.7–1.3)
EGFR: 77 mL/min/{1.73_m2} — AB (ref >90)
Glucose: 102 mg/dl (ref 70–140)
POTASSIUM: 4.3 meq/L (ref 3.5–5.1)
Sodium: 139 mEq/L (ref 136–145)
Total Bilirubin: 0.36 mg/dL (ref 0.20–1.20)
Total Protein: 7.3 g/dL (ref 6.4–8.3)

## 2016-06-13 MED ORDER — PROCHLORPERAZINE MALEATE 10 MG PO TABS
10.0000 mg | ORAL_TABLET | Freq: Four times a day (QID) | ORAL | 0 refills | Status: DC | PRN
Start: 2016-06-13 — End: 2016-06-26

## 2016-06-13 NOTE — Progress Notes (Signed)
START ON PATHWAY REGIMEN - Non-Small Cell Lung     Administer weekly:     Paclitaxel      Carboplatin   **Always confirm dose/schedule in your pharmacy ordering system**  Patient Characteristics: Stage III - Unresectable, PS = 0, 1 AJCC T Category: T2b Current Disease Status: No Distant Mets or Local Recurrence AJCC N Category: N2 AJCC M Category: M0 AJCC 8 Stage Grouping: IIIA Performance Status: PS = 0, 1 Intent of Therapy: Curative Intent, Discussed with Patient 

## 2016-06-13 NOTE — Patient Instructions (Signed)
Steps to Quit Smoking Smoking tobacco can be bad for your health. It can also affect almost every organ in your body. Smoking puts you and people around you at risk for many serious long-lasting (chronic) diseases. Quitting smoking is hard, but it is one of the best things that you can do for your health. It is never too late to quit. What are the benefits of quitting smoking? When you quit smoking, you lower your risk for getting serious diseases and conditions. They can include:  Lung cancer or lung disease.  Heart disease.  Stroke.  Heart attack.  Not being able to have children (infertility).  Weak bones (osteoporosis) and broken bones (fractures). If you have coughing, wheezing, and shortness of breath, those symptoms may get better when you quit. You may also get sick less often. If you are pregnant, quitting smoking can help to lower your chances of having a baby of low birth weight. What can I do to help me quit smoking? Talk with your doctor about what can help you quit smoking. Some things you can do (strategies) include:  Quitting smoking totally, instead of slowly cutting back how much you smoke over a period of time.  Going to in-person counseling. You are more likely to quit if you go to many counseling sessions.  Using resources and support systems, such as:  Online chats with a counselor.  Phone quitlines.  Printed self-help materials.  Support groups or group counseling.  Text messaging programs.  Mobile phone apps or applications.  Taking medicines. Some of these medicines may have nicotine in them. If you are pregnant or breastfeeding, do not take any medicines to quit smoking unless your doctor says it is okay. Talk with your doctor about counseling or other things that can help you. Talk with your doctor about using more than one strategy at the same time, such as taking medicines while you are also going to in-person counseling. This can help make quitting  easier. What things can I do to make it easier to quit? Quitting smoking might feel very hard at first, but there is a lot that you can do to make it easier. Take these steps:  Talk to your family and friends. Ask them to support and encourage you.  Call phone quitlines, reach out to support groups, or work with a counselor.  Ask people who smoke to not smoke around you.  Avoid places that make you want (trigger) to smoke, such as:  Bars.  Parties.  Smoke-break areas at work.  Spend time with people who do not smoke.  Lower the stress in your life. Stress can make you want to smoke. Try these things to help your stress:  Getting regular exercise.  Deep-breathing exercises.  Yoga.  Meditating.  Doing a body scan. To do this, close your eyes, focus on one area of your body at a time from head to toe, and notice which parts of your body are tense. Try to relax the muscles in those areas.  Download or buy apps on your mobile phone or tablet that can help you stick to your quit plan. There are many free apps, such as QuitGuide from the CDC (Centers for Disease Control and Prevention). You can find more support from smokefree.gov and other websites. This information is not intended to replace advice given to you by your health care provider. Make sure you discuss any questions you have with your health care provider. Document Released: 12/23/2008 Document Revised: 10/25/2015 Document   Reviewed: 07/13/2014 Elsevier Interactive Patient Education  2017 Elsevier Inc.  

## 2016-06-13 NOTE — Progress Notes (Signed)
[facility] Telephone:(336) [phone]   Fax:(336) [phone]  CONSULT NOTE  REFERRING PHYSICIAN: Dr. Gery Pray  REASON FOR CONSULTATION:  66 years old African-American male diagnosed with lung cancer.  HPI [name] is a 66 y.o. male was past medical history significant for heart disease status post CABG, abdominal aortic aneurysm, benign prostatic hypertrophy, COPD, hypertension, dyslipidemia as well as peripheral vascular disease and long history of smoking. The patient was seen at the emergency Department complaining of chest pain as well as syncopal episode. Chest x-ray was performed on 03/28/2016 and it showed new masslike density in the left upper lobe. This was followed by CT scan of the chest without contrast and it showed 4.7 cm spiculated cavitary mass in the left upper lobe consistent with neoplasm. CT of the abdomen and pelvis on 03/29/2016 showed no evidence of metastatic disease in the abdomen or pelvis. MRI of the brain also on 03/29/2016 showed no evidence of metastatic disease to the brain. The patient was seen by Dr. Lake Bells and had a bronchoscopy on 04/12/2016 and the final pathology ([id number]) was consistent with invasive squamous cell carcinoma. . PD L 1 expression was 98%. A PET scan on 04/25/2016 showed central necrotic 4.8 cm hypermetabolic left upper lobe mass consistent with squamous cell carcinoma in addition to a small hypermetabolic AP window/left hilar region lymph node compatible with early nodal metastatic disease. No other metastatic lesions identified. The patient was seen by Dr. Sondra Come and he was started on radiotherapy and he kindly referred him to me today for evaluation and consideration of concurrent chemotherapy. When seen today the patient is feeling fine and he denied having any significant complaints except for cough productive of yellowish sputum. He has no chest pain, shortness of breath or hemoptysis. He denied having any  significant weight loss or night sweats. He has no nausea, vomiting, diarrhea or constipation. He denied having any headache or visual changes. Family history significant for mother who died during childbirth, father and brother had prostate cancer. The patient is single and has no children. He was accompanied today by his sister [name]. He is currently on disability. He has a history of smoking 1 pack per day for around 50 years and unfortunately he continues to smoke. He has no history of alcohol or drug abuse.  HPI  Past Medical History:  Diagnosis Date  . AAA (abdominal aortic aneurysm) ([place])   . Abdominal aortic aneurysm ([place])    AAA And bilateral common iliac artery aneurysms  . BPH (benign prostatic hyperplasia)   . CAD (coronary artery disease)    Dr Ron Parker @ [facility]   . Chronic mental illness 04/21/2011   Diagnosis unclear,  Family provides good care  . COPD (chronic obstructive pulmonary disease) ([place]) 04/21/2011   smoking  . Ejection fraction    EF 35-40%, echo, May, 2010  . Hyperlipidemia   . Hyperplastic colon polyp 04/21/2011  . Hypertension   . Preop cardiovascular exam    Cardiac clearance for major vascular surgery, May, 2013  . Pulmonary nodule    Chest CT May, 2013, 2 small pulmonary nodules, needs appropriate followup,  this CT was not ordered by the cardiology team  . PVD (peripheral vascular disease) ([place]) 04/21/2011  . Retention of urine   . Shortness of breath   . Squamous cell lung cancer, left ([place]) 06/13/2016  . Tobacco abuse     Past Surgical History:  Procedure Laterality Date  . ABDOMINAL AORTA STENT  07/2011  . ABDOMINAL AORTAGRAM N/A 07/26/2011   Procedure: ABDOMINAL Maxcine Ham;  Surgeon: Conrad Mendon, MD;  Location: [facility] CATH LAB;  Service: Cardiovascular;  Laterality: N/A;  . ABDOMINAL AORTIC ANEURYSM REPAIR  07/2011  . CORONARY ARTERY BYPASS GRAFT  07/2008   CABG X5/notes 07/12/2010  . EMBOLIZATION Left 07/26/2011   Procedure: EMBOLIZATION;  Surgeon: Conrad Hornitos, MD;  Location[facility] CATH LAB;  Service: Cardiovascular;  Laterality: Left;  . TRANSURETHRAL RESECTION OF PROSTATE  11/29/2011   Procedure: TRANSURETHRAL RESECTION OF THE PROSTATE WITH GYRUS INSTRUMENTS;  Surgeon: Malka So, MD;  Location: [facility] ORS;  Service: Urology;  Laterality: N/A;  Prostate Ultrasound, and Biopsy  . VIDEO BRONCHOSCOPY Bilateral 04/12/2016   Procedure: VIDEO BRONCHOSCOPY WITH FLUORO;  Surgeon: Juanito Doom, MD;  Location: [facility];  Service: Cardiopulmonary;  Laterality: Bilateral;    Family History  Problem Relation Age of Onset  . Cancer Father     prostate  . Heart disease Father   . Diabetes Father   . Heart attack Father 80  . Arthritis Sister   . Hyperlipidemia Sister   . Hypertension Sister   . Anesthesia problems Neg Hx     Social History Social History  Substance Use Topics  . Smoking status: Current Every Day Smoker    Packs/day: 1.00    Years: 50.00    Types: Cigarettes  . Smokeless tobacco: Never Used  . Alcohol use No    Allergies  Allergen Reactions  . Ace Inhibitors Itching    Current Outpatient Prescriptions  Medication Sig Dispense Refill  . aspirin EC 81 MG tablet Take 81 mg by mouth daily.    [name] emollient (BIAFINE) cream Apply topically as needed.    . rosuvastatin (CRESTOR) 40 MG tablet Take 1 tablet (40 mg total) by mouth daily. 30 tablet 3   No current facility-administered medications for this visit.     Review of Systems  Constitutional: negative Eyes: negative Ears, nose, mouth, throat, and face: negative Respiratory: positive for cough and sputum Cardiovascular: negative Gastrointestinal: negative Genitourinary:negative Integument/breast: negative Hematologic/lymphatic: negative Musculoskeletal:negative Neurological: negative Behavioral/Psych: negative Endocrine: negative Allergic/Immunologic: negative  Physical Exam  DUK:GURKY, healthy, no distress and well developed SKIN: skin color, texture,  turgor are normal, no rashes or significant lesions HEAD: Normocephalic, No masses, lesions, tenderness or abnormalities EYES: normal, PERRLA, Conjunctiva are pink and non-injected EARS: External ears normal, Canals clear OROPHARYNX:no exudate, no erythema and lips, buccal mucosa, and tongue normal  NECK: supple, no adenopathy, no JVD LYMPH:  no palpable lymphadenopathy, no hepatosplenomegaly LUNGS: clear to auscultation , and palpation HEART: regular rate & rhythm, no murmurs and no gallops ABDOMEN:abdomen soft, non-tender, normal bowel sounds and no masses or organomegaly BACK: Back symmetric, no curvature., No CVA tenderness EXTREMITIES:no joint deformities, effusion, or inflammation, no edema, no skin discoloration  NEURO: alert & oriented x 3 with fluent speech, no focal motor/sensory deficits  PERFORMANCE STATUS: ECOG 1  LABORATORY DATA: Lab Results  Component Value Date   WBC 6.4 06/13/2016   HGB 11.7 (L) 06/13/2016   HCT 36.3 (L) 06/13/2016   MCV 90.5 06/13/2016   PLT 196 06/13/2016      Chemistry      Component Value Date/Time   NA 139 06/13/2016 1340   K 4.3 06/13/2016 1340   CL 102 05/02/2016 1608   CO2 26 06/13/2016 1340   BUN 13.8 06/13/2016 1340   CREATININE 1.1 06/13/2016 1340      Component Value  Date/Time   CALCIUM 9.3 06/13/2016 1340   ALKPHOS 107 06/13/2016 1340   AST 15 06/13/2016 1340   ALT 12 06/13/2016 1340   BILITOT 0.36 06/13/2016 1340       RADIOGRAPHIC STUDIES: No results found.  ASSESSMENT:This is a very pleasant 66 years old African-American male with recently diagnosed as stage IIIa (T2b, N2, M0) non-small cell lung cancer, squamous cell carcinoma presented with left upper lobe lung mass in addition to AP window lymphadenopathy diagnosed in February 2018.  PLAN: I had a lengthy discussion with the patient and his sister today about his current disease stage, prognosis and treatment options. The patient has already started his  palliative therapy for at least 2 weeks. I'm seeing him today for the first time. I discussed with the patient and his sister recommendation regarding his treatment including continuation with single modality treatment with radiotherapy versus concurrent chemotherapy with radiation for the next 3 weeks. The patient and his sister are interested in proceeding with the concurrent chemotherapy. I recommended for the patient a course of concurrent chemoradiation with weekly carboplatin for AUC of 2 and paclitaxel 45 MG/M2. I discussed with the patient adverse effect of the chemotherapy including but not limited to alopecia, myelosuppression, nausea and vomiting, peripheral neuropathy, liver or renal dysfunction. I will arrange for the patient to have a chemotherapy education class before starting the first dose of his chemotherapy. He is expected to start the first dose of his chemotherapy next week. I will see him back for follow-up visit in 2 weeks for evaluation and management of any adverse effect of his treatment. I will call his pharmacy with prescription for Compazine 10 mg by mouth every 6 hours as needed for nausea. For the dyslipidemia, the patient will continue his current treatment with Crestor. He was advised to call immediately if he has any concerning symptoms in the interval. The patient voices understanding of current disease status and treatment options and is in agreement with the current care plan. All questions were answered. The patient knows to call the clinic with any problems, questions or concerns. We can certainly see the patient much sooner if necessary.  Thank you so much for allowing me to participate in the care of [name]. I will continue to follow up the patient with you and assist in his care.  I spent 55 minutes counseling the patient face to face. The total time spent in the appointment was 80 minutes.  Disclaimer: This note was dictated with voice recognition  software. Similar sounding words can inadvertently be transcribed and may not be corrected upon review.   Kanita Delage K. June 13, 2016, 2:33 PM

## 2016-06-14 ENCOUNTER — Ambulatory Visit: Payer: Medicare Other

## 2016-06-15 ENCOUNTER — Ambulatory Visit
Admission: RE | Admit: 2016-06-15 | Discharge: 2016-06-15 | Disposition: A | Discharge status: Home / Self Care | Payer: Medicare Other | Source: Ambulatory Visit | Attending: Radiation Oncology | Admitting: Radiation Oncology

## 2016-06-15 ENCOUNTER — Encounter: Payer: Self-pay | Admitting: *Deleted

## 2016-06-15 ENCOUNTER — Telehealth: Payer: Self-pay | Admitting: Internal Medicine

## 2016-06-15 DIAGNOSIS — Z510 Encounter for antineoplastic radiation therapy: Secondary | ICD-10-CM | POA: Diagnosis not present

## 2016-06-15 DIAGNOSIS — C3412 Malignant neoplasm of upper lobe, left bronchus or lung: Secondary | ICD-10-CM | POA: Diagnosis not present

## 2016-06-15 NOTE — Telephone Encounter (Signed)
Scheduled appts per 06/13/2016 los . Spoke to patients sister - she will pick up news schedule when they come in on 4/9

## 2016-06-18 ENCOUNTER — Other Ambulatory Visit: Payer: Medicare Other

## 2016-06-18 ENCOUNTER — Ambulatory Visit
Admission: RE | Admit: 2016-06-18 | Discharge: 2016-06-18 | Disposition: A | Discharge status: Home / Self Care | Payer: Medicare Other | Source: Ambulatory Visit | Attending: Radiation Oncology | Admitting: Radiation Oncology

## 2016-06-18 DIAGNOSIS — C3412 Malignant neoplasm of upper lobe, left bronchus or lung: Secondary | ICD-10-CM | POA: Diagnosis not present

## 2016-06-18 DIAGNOSIS — Z510 Encounter for antineoplastic radiation therapy: Secondary | ICD-10-CM | POA: Diagnosis not present

## 2016-06-19 ENCOUNTER — Ambulatory Visit: Payer: Medicare Other

## 2016-06-19 ENCOUNTER — Ambulatory Visit: Admission: RE | Admit: 2016-06-19 | Payer: Medicare Other | Source: Ambulatory Visit | Admitting: Radiation Oncology

## 2016-06-19 ENCOUNTER — Ambulatory Visit
Admission: RE | Admit: 2016-06-19 | Discharge: 2016-06-19 | Disposition: A | Discharge status: Home / Self Care | Payer: Medicare Other | Source: Ambulatory Visit | Attending: Radiation Oncology | Admitting: Radiation Oncology

## 2016-06-19 ENCOUNTER — Other Ambulatory Visit: Payer: Medicare Other

## 2016-06-19 DIAGNOSIS — C3412 Malignant neoplasm of upper lobe, left bronchus or lung: Secondary | ICD-10-CM | POA: Diagnosis not present

## 2016-06-19 DIAGNOSIS — Z510 Encounter for antineoplastic radiation therapy: Secondary | ICD-10-CM | POA: Diagnosis not present

## 2016-06-20 ENCOUNTER — Encounter: Payer: Self-pay | Admitting: Oncology

## 2016-06-20 ENCOUNTER — Ambulatory Visit: Payer: Medicare Other

## 2016-06-20 ENCOUNTER — Ambulatory Visit
Admission: RE | Admit: 2016-06-20 | Discharge: 2016-06-20 | Disposition: A | Discharge status: Home / Self Care | Payer: Medicare Other | Source: Ambulatory Visit | Attending: Radiation Oncology | Admitting: Radiation Oncology

## 2016-06-20 ENCOUNTER — Other Ambulatory Visit: Payer: Medicare Other

## 2016-06-20 ENCOUNTER — Telehealth: Payer: Self-pay | Admitting: Internal Medicine

## 2016-06-20 DIAGNOSIS — Z510 Encounter for antineoplastic radiation therapy: Secondary | ICD-10-CM | POA: Diagnosis not present

## 2016-06-20 DIAGNOSIS — C3412 Malignant neoplasm of upper lobe, left bronchus or lung: Secondary | ICD-10-CM | POA: Diagnosis not present

## 2016-06-20 NOTE — Telephone Encounter (Signed)
Talked Sister [name] - aware of appts changed and chemo to start 4/17 per MM.

## 2016-06-21 ENCOUNTER — Ambulatory Visit: Payer: Medicare Other

## 2016-06-21 ENCOUNTER — Ambulatory Visit
Admission: RE | Admit: 2016-06-21 | Discharge: 2016-06-21 | Disposition: A | Discharge status: Home / Self Care | Payer: Medicare Other | Source: Ambulatory Visit | Attending: Radiation Oncology | Admitting: Radiation Oncology

## 2016-06-21 ENCOUNTER — Other Ambulatory Visit: Payer: Medicare Other

## 2016-06-21 DIAGNOSIS — Z510 Encounter for antineoplastic radiation therapy: Secondary | ICD-10-CM | POA: Diagnosis not present

## 2016-06-21 DIAGNOSIS — C3412 Malignant neoplasm of upper lobe, left bronchus or lung: Secondary | ICD-10-CM | POA: Diagnosis not present

## 2016-06-22 ENCOUNTER — Ambulatory Visit
Admission: RE | Admit: 2016-06-22 | Discharge: 2016-06-22 | Disposition: A | Discharge status: Home / Self Care | Payer: Medicare Other | Source: Ambulatory Visit | Attending: Radiation Oncology | Admitting: Radiation Oncology

## 2016-06-22 DIAGNOSIS — C3412 Malignant neoplasm of upper lobe, left bronchus or lung: Secondary | ICD-10-CM | POA: Diagnosis not present

## 2016-06-22 DIAGNOSIS — Z510 Encounter for antineoplastic radiation therapy: Secondary | ICD-10-CM | POA: Diagnosis not present

## 2016-06-25 ENCOUNTER — Telehealth: Payer: Self-pay | Admitting: Internal Medicine

## 2016-06-25 ENCOUNTER — Encounter: Payer: Self-pay | Admitting: *Deleted

## 2016-06-25 ENCOUNTER — Ambulatory Visit
Admission: RE | Admit: 2016-06-25 | Discharge: 2016-06-25 | Disposition: A | Discharge status: Home / Self Care | Payer: Medicare Other | Source: Ambulatory Visit | Attending: Radiation Oncology | Admitting: Radiation Oncology

## 2016-06-25 DIAGNOSIS — Z510 Encounter for antineoplastic radiation therapy: Secondary | ICD-10-CM | POA: Diagnosis not present

## 2016-06-25 DIAGNOSIS — C3412 Malignant neoplasm of upper lobe, left bronchus or lung: Secondary | ICD-10-CM | POA: Diagnosis not present

## 2016-06-25 NOTE — Progress Notes (Signed)
Oncology Nurse Navigator Documentation  Oncology Nurse Navigator Flowsheets 06/25/2016  Navigator Location [facility]  Navigator Encounter Type Other/I updated Dr. Julien Nordmann on [name] schedule.  He requested me to have chemo on 07/10/16 cancelled.  I notified scheduling to cancel.   Treatment Phase Treatment  Barriers/Navigation Needs Coordination of Care  Interventions Coordination of Care  Coordination of Care Appts  Acuity Level 2

## 2016-06-25 NOTE — Telephone Encounter (Signed)
Cancelled appt per schedule message from [facility]. Patients sister [name] aware of cancelled appts.

## 2016-06-26 ENCOUNTER — Ambulatory Visit
Admission: RE | Admit: 2016-06-26 | Discharge: 2016-06-26 | Disposition: A | Discharge status: Home / Self Care | Payer: Medicare Other | Source: Ambulatory Visit | Attending: Radiation Oncology | Admitting: Radiation Oncology

## 2016-06-26 ENCOUNTER — Ambulatory Visit (HOSPITAL_BASED_OUTPATIENT_CLINIC_OR_DEPARTMENT_OTHER): Payer: Medicare Other

## 2016-06-26 ENCOUNTER — Other Ambulatory Visit (HOSPITAL_BASED_OUTPATIENT_CLINIC_OR_DEPARTMENT_OTHER): Payer: Medicare Other

## 2016-06-26 ENCOUNTER — Other Ambulatory Visit: Payer: Self-pay | Admitting: *Deleted

## 2016-06-26 VITALS — BP 152/98 | HR 65 | Temp 99.0°F | Resp 18

## 2016-06-26 DIAGNOSIS — Z5111 Encounter for antineoplastic chemotherapy: Secondary | ICD-10-CM | POA: Diagnosis not present

## 2016-06-26 DIAGNOSIS — C3412 Malignant neoplasm of upper lobe, left bronchus or lung: Secondary | ICD-10-CM | POA: Diagnosis not present

## 2016-06-26 DIAGNOSIS — Z510 Encounter for antineoplastic radiation therapy: Secondary | ICD-10-CM | POA: Diagnosis not present

## 2016-06-26 DIAGNOSIS — R918 Other nonspecific abnormal finding of lung field: Secondary | ICD-10-CM

## 2016-06-26 LAB — COMPREHENSIVE METABOLIC PANEL
ALBUMIN: 2.9 g/dL — AB (ref 3.5–5.0)
ALT: 13 U/L (ref 0–55)
ANION GAP: 12 meq/L — AB (ref 3–11)
AST: 18 U/L (ref 5–34)
Alkaline Phosphatase: 97 U/L (ref 40–150)
BILIRUBIN TOTAL: 0.48 mg/dL (ref 0.20–1.20)
BUN: 10.8 mg/dL (ref 7.0–26.0)
CALCIUM: 9.2 mg/dL (ref 8.4–10.4)
CO2: 27 mEq/L (ref 22–29)
CREATININE: 1 mg/dL (ref 0.7–1.3)
Chloride: 107 mEq/L (ref 98–109)
EGFR: >90 mL/min/{1.73_m2} (ref >90)
Glucose: 80 mg/dl (ref 70–140)
Potassium: 3.2 mEq/L — ABNORMAL LOW (ref 3.5–5.1)
Sodium: 145 mEq/L (ref 136–145)
TOTAL PROTEIN: 7.2 g/dL (ref 6.4–8.3)

## 2016-06-26 LAB — CBC WITH DIFFERENTIAL/PLATELET
BASO%: 1.1 % (ref 0.0–2.0)
Basophils Absolute: 0.1 10*3/uL (ref 0.0–0.1)
EOS%: 1.8 % (ref 0.0–7.0)
Eosinophils Absolute: 0.1 10*3/uL (ref 0.0–0.5)
HEMATOCRIT: 36.7 % — AB (ref 38.4–49.9)
HEMOGLOBIN: 12.1 g/dL — AB (ref 13.0–17.1)
LYMPH#: 0.4 10*3/uL — AB (ref 0.9–3.3)
LYMPH%: 7.3 % — ABNORMAL LOW (ref 14.0–49.0)
MCH: 29.7 pg (ref 27.2–33.4)
MCHC: 32.9 g/dL (ref 32.0–36.0)
MCV: 90.5 fL (ref 79.3–98.0)
MONO#: 0.4 10*3/uL (ref 0.1–0.9)
MONO%: 8.3 % (ref 0.0–14.0)
NEUT%: 81.5 % — AB (ref 39.0–75.0)
NEUTROS ABS: 4.1 10*3/uL (ref 1.5–6.5)
PLATELETS: 280 10*3/uL (ref 140–400)
RBC: 4.05 10*6/uL — ABNORMAL LOW (ref 4.20–5.82)
RDW: 15.4 % — ABNORMAL HIGH (ref 11.0–14.6)
WBC: 5.1 10*3/uL (ref 4.0–10.3)

## 2016-06-26 MED ORDER — SODIUM CHLORIDE 0.9 % IV SOLN
160.0000 mg | Freq: Once | INTRAVENOUS | Status: AC
  Administered 2016-06-26: 160 mg via INTRAVENOUS
  Filled 2016-06-26: qty 16

## 2016-06-26 MED ORDER — SODIUM CHLORIDE 0.9 % IV SOLN
Freq: Once | INTRAVENOUS | Status: AC
  Administered 2016-06-26: 12:00:00 via INTRAVENOUS

## 2016-06-26 MED ORDER — PALONOSETRON HCL INJECTION 0.25 MG/5ML
0.2500 mg | Freq: Once | INTRAVENOUS | Status: AC
  Administered 2016-06-26: 0.25 mg via INTRAVENOUS

## 2016-06-26 MED ORDER — SODIUM CHLORIDE 0.9 % IV SOLN
20.0000 mg | Freq: Once | INTRAVENOUS | Status: AC
  Administered 2016-06-26: 20 mg via INTRAVENOUS
  Filled 2016-06-26: qty 2

## 2016-06-26 MED ORDER — DIPHENHYDRAMINE HCL 50 MG/ML IJ SOLN
50.0000 mg | Freq: Once | INTRAMUSCULAR | Status: AC
Start: 2016-06-26 — End: 2016-06-26
  Administered 2016-06-26: 50 mg via INTRAVENOUS

## 2016-06-26 MED ORDER — FAMOTIDINE IN NACL 20-0.9 MG/50ML-% IV SOLN
20.0000 mg | Freq: Once | INTRAVENOUS | Status: AC
  Administered 2016-06-26: 20 mg via INTRAVENOUS

## 2016-06-26 MED ORDER — PROCHLORPERAZINE MALEATE 10 MG PO TABS: 10.0000 mg | ORAL_TABLET | Freq: Four times a day (QID) | ORAL | 0 refills | Status: DC | PRN

## 2016-06-26 MED ORDER — PACLITAXEL CHEMO INJECTION 300 MG/50ML
45.0000 mg/m2 | Freq: Once | INTRAVENOUS | Status: AC
  Administered 2016-06-26: 78 mg via INTRAVENOUS
  Filled 2016-06-26: qty 13

## 2016-06-26 NOTE — Progress Notes (Signed)
Pt tolerated treatment well. Verbally Reviewed and printed discharge instructions with patient and patient's sister, and both educated to report to ER with any s/s of high blood pressure. Pt and patient's sister verbalizes understanding.  Pt stable at discharge.

## 2016-06-26 NOTE — Patient Instructions (Signed)
[facility] Discharge Instructions for Patients Receiving Chemotherapy  Today you received the following chemotherapy agents: Taxol and Carboplatin   To help prevent nausea and vomiting after your treatment, we encourage you to take your nausea medication as directed.    If you develop nausea and vomiting that is not controlled by your nausea medication, call the clinic.   BELOW ARE SYMPTOMS THAT SHOULD BE REPORTED IMMEDIATELY:  *FEVER GREATER THAN 100.5 F  *CHILLS WITH OR WITHOUT FEVER  NAUSEA AND VOMITING THAT IS NOT CONTROLLED WITH YOUR NAUSEA MEDICATION  *UNUSUAL SHORTNESS OF BREATH  *UNUSUAL BRUISING OR BLEEDING  TENDERNESS IN MOUTH AND THROAT WITH OR WITHOUT PRESENCE OF ULCERS  *URINARY PROBLEMS  *BOWEL PROBLEMS  UNUSUAL RASH Items with * indicate a potential emergency and should be followed up as soon as possible.  Feel free to call the clinic you have any questions or concerns. The clinic phone number is [phone].  Please show the [facility] at check-in to the Emergency Department and triage nurse.   Paclitaxel injection What is this medicine? PACLITAXEL (PAK li TAX el) is a chemotherapy drug. It targets fast dividing cells, like cancer cells, and causes these cells to die. This medicine is used to treat ovarian cancer, breast cancer, and other cancers. This medicine may be used for other purposes; ask your health care provider or pharmacist if you have questions. COMMON BRAND NAME(S): Onxol, Taxol What should I tell my health care provider before I take this medicine? They need to know if you have any of these conditions: -blood disorders -irregular heartbeat -infection (especially a virus infection such as chickenpox, cold sores, or herpes) -liver disease -previous or ongoing radiation therapy -an unusual or allergic reaction to paclitaxel, alcohol, polyoxyethylated castor oil, other chemotherapy agents, other medicines, foods, dyes,  or preservatives -pregnant or trying to get pregnant -breast-feeding How should I use this medicine? This drug is given as an infusion into a vein. It is administered in a hospital or clinic by a specially trained health care professional. Talk to your pediatrician regarding the use of this medicine in children. Special care may be needed. Overdosage: If you think you have taken too much of this medicine contact a poison control center or emergency room at once. NOTE: This medicine is only for you. Do not share this medicine with others. What if I miss a dose? It is important not to miss your dose. Call your doctor or health care professional if you are unable to keep an appointment. What may interact with this medicine? Do not take this medicine with any of the following medications: -disulfiram -metronidazole This medicine may also interact with the following medications: -cyclosporine -diazepam -ketoconazole -medicines to increase blood counts like filgrastim, pegfilgrastim, sargramostim -other chemotherapy drugs like cisplatin, doxorubicin, epirubicin, etoposide, teniposide, vincristine -quinidine -testosterone -vaccines -verapamil Talk to your doctor or health care professional before taking any of these medicines: -acetaminophen -aspirin -ibuprofen -ketoprofen -naproxen This list may not describe all possible interactions. Give your health care provider a list of all the medicines, herbs, non-prescription drugs, or dietary supplements you use. Also tell them if you smoke, drink alcohol, or use illegal drugs. Some items may interact with your medicine. What should I watch for while using this medicine? Your condition will be monitored carefully while you are receiving this medicine. You will need important blood work done while you are taking this medicine. This medicine can cause serious allergic reactions. To reduce your risk you  will need to take other medicine(s) before  treatment with this medicine. If you experience allergic reactions like skin rash, itching or hives, swelling of the face, lips, or tongue, tell your doctor or health care professional right away. In some cases, you may be given additional medicines to help with side effects. Follow all directions for their use. This drug may make you feel generally unwell. This is not uncommon, as chemotherapy can affect healthy cells as well as cancer cells. Report any side effects. Continue your course of treatment even though you feel ill unless your doctor tells you to stop. Call your doctor or health care professional for advice if you get a fever, chills or sore throat, or other symptoms of a cold or flu. Do not treat yourself. This drug decreases your body's ability to fight infections. Try to avoid being around people who are sick. This medicine may increase your risk to bruise or bleed. Call your doctor or health care professional if you notice any unusual bleeding. Be careful brushing and flossing your teeth or using a toothpick because you may get an infection or bleed more easily. If you have any dental work done, tell your dentist you are receiving this medicine. Avoid taking products that contain aspirin, acetaminophen, ibuprofen, naproxen, or ketoprofen unless instructed by your doctor. These medicines may hide a fever. Do not become pregnant while taking this medicine. Women should inform their doctor if they wish to become pregnant or think they might be pregnant. There is a potential for serious side effects to an unborn child. Talk to your health care professional or pharmacist for more information. Do not breast-feed an infant while taking this medicine. Men are advised not to father a child while receiving this medicine. This product may contain alcohol. Ask your pharmacist or healthcare provider if this medicine contains alcohol. Be sure to tell all healthcare providers you are taking this medicine.  Certain medicines, like metronidazole and disulfiram, can cause an unpleasant reaction when taken with alcohol. The reaction includes flushing, headache, nausea, vomiting, sweating, and increased thirst. The reaction can last from 30 minutes to several hours. What side effects may I notice from receiving this medicine? Side effects that you should report to your doctor or health care professional as soon as possible: -allergic reactions like skin rash, itching or hives, swelling of the face, lips, or tongue -low blood counts - This drug may decrease the number of white blood cells, red blood cells and platelets. You may be at increased risk for infections and bleeding. -signs of infection - fever or chills, cough, sore throat, pain or difficulty passing urine -signs of decreased platelets or bleeding - bruising, pinpoint red spots on the skin, black, tarry stools, nosebleeds -signs of decreased red blood cells - unusually weak or tired, fainting spells, lightheadedness -breathing problems -chest pain -high or low blood pressure -mouth sores -nausea and vomiting -pain, swelling, redness or irritation at the injection site -pain, tingling, numbness in the hands or feet -slow or irregular heartbeat -swelling of the ankle, feet, hands Side effects that usually do not require medical attention (report to your doctor or health care professional if they continue or are bothersome): -bone pain -complete hair loss including hair on your head, underarms, pubic hair, eyebrows, and eyelashes -changes in the color of fingernails -diarrhea -loosening of the fingernails -loss of appetite -muscle or joint pain -red flush to skin -sweating This list may not describe all possible side effects. Call your doctor for  medical advice about side effects. You may report side effects to FDA at 1-800-FDA-1088. Where should I keep my medicine? This drug is given in a hospital or clinic and will not be stored at  home. NOTE: This sheet is a summary. It may not cover all possible information. If you have questions about this medicine, talk to your doctor, pharmacist, or health care provider.  2018 Elsevier/Gold Standard (2014-12-28 19:58:00)    Carboplatin injection What is this medicine? CARBOPLATIN (KAR boe pla tin) is a chemotherapy drug. It targets fast dividing cells, like cancer cells, and causes these cells to die. This medicine is used to treat ovarian cancer and many other cancers. This medicine may be used for other purposes; ask your health care provider or pharmacist if you have questions. COMMON BRAND NAME(S): Paraplatin What should I tell my health care provider before I take this medicine? They need to know if you have any of these conditions: -blood disorders -hearing problems -kidney disease -recent or ongoing radiation therapy -an unusual or allergic reaction to carboplatin, cisplatin, other chemotherapy, other medicines, foods, dyes, or preservatives -pregnant or trying to get pregnant -breast-feeding How should I use this medicine? This drug is usually given as an infusion into a vein. It is administered in a hospital or clinic by a specially trained health care professional. Talk to your pediatrician regarding the use of this medicine in children. Special care may be needed. Overdosage: If you think you have taken too much of this medicine contact a poison control center or emergency room at once. NOTE: This medicine is only for you. Do not share this medicine with others. What if I miss a dose? It is important not to miss a dose. Call your doctor or health care professional if you are unable to keep an appointment. What may interact with this medicine? -medicines for seizures -medicines to increase blood counts like filgrastim, pegfilgrastim, sargramostim -some antibiotics like amikacin, gentamicin, neomycin, streptomycin, tobramycin -vaccines Talk to your doctor or  health care professional before taking any of these medicines: -acetaminophen -aspirin -ibuprofen -ketoprofen -naproxen This list may not describe all possible interactions. Give your health care provider a list of all the medicines, herbs, non-prescription drugs, or dietary supplements you use. Also tell them if you smoke, drink alcohol, or use illegal drugs. Some items may interact with your medicine. What should I watch for while using this medicine? Your condition will be monitored carefully while you are receiving this medicine. You will need important blood work done while you are taking this medicine. This drug may make you feel generally unwell. This is not uncommon, as chemotherapy can affect healthy cells as well as cancer cells. Report any side effects. Continue your course of treatment even though you feel ill unless your doctor tells you to stop. In some cases, you may be given additional medicines to help with side effects. Follow all directions for their use. Call your doctor or health care professional for advice if you get a fever, chills or sore throat, or other symptoms of a cold or flu. Do not treat yourself. This drug decreases your body's ability to fight infections. Try to avoid being around people who are sick. This medicine may increase your risk to bruise or bleed. Call your doctor or health care professional if you notice any unusual bleeding. Be careful brushing and flossing your teeth or using a toothpick because you may get an infection or bleed more easily. If you have any dental work  done, tell your dentist you are receiving this medicine. Avoid taking products that contain aspirin, acetaminophen, ibuprofen, naproxen, or ketoprofen unless instructed by your doctor. These medicines may hide a fever. Do not become pregnant while taking this medicine. Women should inform their doctor if they wish to become pregnant or think they might be pregnant. There is a potential for  serious side effects to an unborn child. Talk to your health care professional or pharmacist for more information. Do not breast-feed an infant while taking this medicine. What side effects may I notice from receiving this medicine? Side effects that you should report to your doctor or health care professional as soon as possible: -allergic reactions like skin rash, itching or hives, swelling of the face, lips, or tongue -signs of infection - fever or chills, cough, sore throat, pain or difficulty passing urine -signs of decreased platelets or bleeding - bruising, pinpoint red spots on the skin, black, tarry stools, nosebleeds -signs of decreased red blood cells - unusually weak or tired, fainting spells, lightheadedness -breathing problems -changes in hearing -changes in vision -chest pain -high blood pressure -low blood counts - This drug may decrease the number of white blood cells, red blood cells and platelets. You may be at increased risk for infections and bleeding. -nausea and vomiting -pain, swelling, redness or irritation at the injection site -pain, tingling, numbness in the hands or feet -problems with balance, talking, walking -trouble passing urine or change in the amount of urine Side effects that usually do not require medical attention (report to your doctor or health care professional if they continue or are bothersome): -hair loss -loss of appetite -metallic taste in the mouth or changes in taste This list may not describe all possible side effects. Call your doctor for medical advice about side effects. You may report side effects to FDA at 1-800-FDA-1088. Where should I keep my medicine? This drug is given in a hospital or clinic and will not be stored at home. NOTE: This sheet is a summary. It may not cover all possible information. If you have questions about this medicine, talk to your doctor, pharmacist, or health care provider.  2018 Elsevier/Gold Standard  (2007-06-03 14:38:05)   Palonosetron Injection What is this medicine? PALONOSETRON (pal oh NOE se tron) is used to prevent nausea and vomiting caused by chemotherapy. It also helps prevent delayed nausea and vomiting that may occur a few days after your treatment. This medicine may be used for other purposes; ask your health care provider or pharmacist if you have questions. COMMON BRAND NAME(S): Aloxi What should I tell my health care provider before I take this medicine? They need to know if you have any of these conditions: -an unusual or allergic reaction to palonosetron, dolasetron, granisetron, ondansetron, other medicines, foods, dyes, or preservatives -pregnant or trying to get pregnant -breast-feeding How should I use this medicine? This medicine is for infusion into a vein. It is given by a health care professional in a hospital or clinic setting. Talk to your pediatrician regarding the use of this medicine in children. While this drug may be prescribed for children as young as 1 month for selected conditions, precautions do apply. Overdosage: If you think you have taken too much of this medicine contact a poison control center or emergency room at once. NOTE: This medicine is only for you. Do not share this medicine with others. What if I miss a dose? This does not apply. What may interact with this medicine? -certain  medicines for depression, anxiety, or psychotic disturbances -fentanyl -linezolid -MAOIs like Carbex, Eldepryl, Marplan, Nardil, and Parnate -methylene blue (injected into a vein) -tramadol This list may not describe all possible interactions. Give your health care provider a list of all the medicines, herbs, non-prescription drugs, or dietary supplements you use. Also tell them if you smoke, drink alcohol, or use illegal drugs. Some items may interact with your medicine. What should I watch for while using this medicine? Your condition will be monitored  carefully while you are receiving this medicine. What side effects may I notice from receiving this medicine? Side effects that you should report to your doctor or health care professional as soon as possible: -allergic reactions like skin rash, itching or hives, swelling of the face, lips, or tongue -breathing problems -confusion -dizziness -fast, irregular heartbeat -fever and chills -loss of balance or coordination -seizures -sweating -swelling of the hands and feet -tremors -unusually weak or tired Side effects that usually do not require medical attention (report to your doctor or health care professional if they continue or are bothersome): -constipation or diarrhea -headache This list may not describe all possible side effects. Call your doctor for medical advice about side effects. You may report side effects to FDA at 1-800-FDA-1088. Where should I keep my medicine? This drug is given in a hospital or clinic and will not be stored at home. NOTE: This sheet is a summary. It may not cover all possible information. If you have questions about this medicine, talk to your doctor, pharmacist, or health care provider.  2018 Elsevier/Gold Standard (2013-01-02 10:38:36)
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
## 2016-06-27 ENCOUNTER — Ambulatory Visit: Payer: Medicare Other

## 2016-06-27 ENCOUNTER — Other Ambulatory Visit: Payer: Medicare Other

## 2016-06-27 ENCOUNTER — Ambulatory Visit
Admission: RE | Admit: 2016-06-27 | Discharge: 2016-06-27 | Disposition: A | Discharge status: Home / Self Care | Payer: Medicare Other | Source: Ambulatory Visit | Attending: Radiation Oncology | Admitting: Radiation Oncology

## 2016-06-27 DIAGNOSIS — C3412 Malignant neoplasm of upper lobe, left bronchus or lung: Secondary | ICD-10-CM | POA: Diagnosis not present

## 2016-06-27 DIAGNOSIS — Z510 Encounter for antineoplastic radiation therapy: Secondary | ICD-10-CM | POA: Diagnosis not present

## 2016-06-28 ENCOUNTER — Ambulatory Visit: Payer: Medicare Other

## 2016-06-29 ENCOUNTER — Ambulatory Visit
Admission: RE | Admit: 2016-06-29 | Discharge: 2016-06-29 | Disposition: A | Discharge status: Home / Self Care | Payer: Medicare Other | Source: Ambulatory Visit | Attending: Radiation Oncology | Admitting: Radiation Oncology

## 2016-06-29 ENCOUNTER — Other Ambulatory Visit: Payer: Self-pay | Admitting: Internal Medicine

## 2016-06-29 DIAGNOSIS — C3412 Malignant neoplasm of upper lobe, left bronchus or lung: Secondary | ICD-10-CM | POA: Diagnosis not present

## 2016-06-29 DIAGNOSIS — Z510 Encounter for antineoplastic radiation therapy: Secondary | ICD-10-CM | POA: Diagnosis not present

## 2016-07-02 ENCOUNTER — Ambulatory Visit: Payer: Medicare Other

## 2016-07-02 ENCOUNTER — Other Ambulatory Visit: Payer: Self-pay | Admitting: Internal Medicine

## 2016-07-02 ENCOUNTER — Ambulatory Visit (HOSPITAL_BASED_OUTPATIENT_CLINIC_OR_DEPARTMENT_OTHER): Payer: Medicare Other | Admitting: Oncology

## 2016-07-02 ENCOUNTER — Other Ambulatory Visit (HOSPITAL_BASED_OUTPATIENT_CLINIC_OR_DEPARTMENT_OTHER): Payer: Medicare Other

## 2016-07-02 ENCOUNTER — Encounter: Payer: Self-pay | Admitting: Oncology

## 2016-07-02 ENCOUNTER — Telehealth: Payer: Self-pay | Admitting: Medical Oncology

## 2016-07-02 VITALS — BP 141/89 | HR 61 | Temp 97.7°F | Resp 17 | Ht 75.0 in | Wt 139.1 lb

## 2016-07-02 DIAGNOSIS — K209 Esophagitis, unspecified without bleeding: Secondary | ICD-10-CM

## 2016-07-02 DIAGNOSIS — C3412 Malignant neoplasm of upper lobe, left bronchus or lung: Secondary | ICD-10-CM | POA: Diagnosis not present

## 2016-07-02 DIAGNOSIS — C3492 Malignant neoplasm of unspecified part of left bronchus or lung: Secondary | ICD-10-CM

## 2016-07-02 DIAGNOSIS — Z5111 Encounter for antineoplastic chemotherapy: Secondary | ICD-10-CM

## 2016-07-02 DIAGNOSIS — I255 Ischemic cardiomyopathy: Secondary | ICD-10-CM

## 2016-07-02 LAB — COMPREHENSIVE METABOLIC PANEL
ALT: 18 U/L (ref 0–55)
ANION GAP: 11 meq/L (ref 3–11)
AST: 19 U/L (ref 5–34)
Albumin: 3.3 g/dL — ABNORMAL LOW (ref 3.5–5.0)
Alkaline Phosphatase: 90 U/L (ref 40–150)
BUN: 19.1 mg/dL (ref 7.0–26.0)
CHLORIDE: 103 meq/L (ref 98–109)
CO2: 26 meq/L (ref 22–29)
Calcium: 9.6 mg/dL (ref 8.4–10.4)
Creatinine: 1 mg/dL (ref 0.7–1.3)
Glucose: 103 mg/dl (ref 70–140)
POTASSIUM: 4.1 meq/L (ref 3.5–5.1)
Sodium: 140 mEq/L (ref 136–145)
Total Bilirubin: 0.27 mg/dL (ref 0.20–1.20)
Total Protein: 7.7 g/dL (ref 6.4–8.3)

## 2016-07-02 LAB — CBC WITH DIFFERENTIAL/PLATELET
BASO%: 0.2 % (ref 0.0–2.0)
BASOS ABS: 0 10*3/uL (ref 0.0–0.1)
EOS%: 4.2 % (ref 0.0–7.0)
Eosinophils Absolute: 0.2 10*3/uL (ref 0.0–0.5)
HCT: 36.6 % — ABNORMAL LOW (ref 38.4–49.9)
HGB: 11.8 g/dL — ABNORMAL LOW (ref 13.0–17.1)
LYMPH#: 0.6 10*3/uL — AB (ref 0.9–3.3)
LYMPH%: 13.7 % — AB (ref 14.0–49.0)
MCH: 29.4 pg (ref 27.2–33.4)
MCHC: 32.2 g/dL (ref 32.0–36.0)
MCV: 91.3 fL (ref 79.3–98.0)
MONO#: 0.3 10*3/uL (ref 0.1–0.9)
MONO%: 8.5 % (ref 0.0–14.0)
NEUT#: 2.9 10*3/uL (ref 1.5–6.5)
NEUT%: 73.4 % (ref 39.0–75.0)
PLATELETS: 239 10*3/uL (ref 140–400)
RBC: 4.01 10*6/uL — AB (ref 4.20–5.82)
RDW: 15.4 % — AB (ref 11.0–14.6)
WBC: 4 10*3/uL (ref 4.0–10.3)

## 2016-07-02 MED ORDER — SUCRALFATE 1 GM/10ML PO SUSP: 1.0000 g | Freq: Three times a day (TID) | ORAL | 0 refills | Status: DC

## 2016-07-02 NOTE — Telephone Encounter (Signed)
Pt seen by Altamese Dilling today for chemo f/u.

## 2016-07-02 NOTE — Progress Notes (Signed)
No images are attached to the encounter. No scans are attached to the encounter. No scans are attached to the encounter. [place] SHARED VISIT PROGRESS NOTE  Adrian Cower, MD [address] [place] [address]  DIAGNOSIS: This is a 66 year old African-American male with stage IIIa (T2b, N2, M0) non-small cell lung cancer, squamous cell carcinoma presented with left upper lobe lung mass in addition to AP window lymphadenopathy diagnosed in February 2018.  PRIOR THERAPY: None  CURRENT THERAPY: Concurrent chemoradiation therapy. He received radiation for approximately 2-3 weeks prior to starting his chemotherapy. He is receiving weekly carboplatin for an AUC of 2 and paclitaxel 45 mg meter squared. First cycle was given on 06/26/2016. Status post one cycle of chemotherapy.  INTERVAL HISTORY: [name] 66 y.o. male returns for a follow-up prior to his next cycle of chemotherapy. He is feeling well overall. He reports some difficulty swallowing due to radiation. He indicates that he was told that he might receive medication for this, but he has not received it as of yet. He continues to eat well and he is gaining weight. He tolerated his first cycle chemotherapy well last week. His fevers and chills. Denies cough, hemoptysis, shortness of breath, chest pain. Denies abdominal pain, nausea, and vomiting. No neuropathy. He is due to complete radiation early next week. The patient is ready to proceed with his second cycle of chemotherapy scheduled for tomorrow.  MEDICAL HISTORY: Past Medical History:  Diagnosis Date  . AAA (abdominal aortic aneurysm) ([place])   . Abdominal aortic aneurysm ([place])    AAA And bilateral common iliac artery aneurysms  . BPH (benign prostatic hyperplasia)   . CAD (coronary artery disease)    Dr Ron Parker @ [facility]   . Chronic mental illness 04/21/2011   Diagnosis unclear,  Family provides good care  . COPD (chronic obstructive pulmonary disease) ([place])  04/21/2011   smoking  . Ejection fraction    EF 35-40%, echo, May, 2010  . Hyperlipidemia   . Hyperplastic colon polyp 04/21/2011  . Hypertension   . Preop cardiovascular exam    Cardiac clearance for major vascular surgery, May, 2013  . Pulmonary nodule    Chest CT May, 2013, 2 small pulmonary nodules, needs appropriate followup,  this CT was not ordered by the cardiology team  . PVD (peripheral vascular disease) ([place]) 04/21/2011  . Retention of urine   . Shortness of breath   . Squamous cell lung cancer, left ([place]) 06/13/2016  . Tobacco abuse     ALLERGIES:  is allergic to ace inhibitors.  MEDICATIONS:  Current Outpatient Prescriptions  Medication Sig Dispense Refill  . aspirin EC 81 MG tablet Take 81 mg by mouth daily.    [name] emollient (BIAFINE) cream Apply topically as needed.    . prochlorperazine (COMPAZINE) 10 MG tablet Take 1 tablet (10 mg total) by mouth every 6 (six) hours as needed for nausea or vomiting. 30 tablet 0  . rosuvastatin (CRESTOR) 40 MG tablet Take 1 tablet (40 mg total) by mouth daily. 90 tablet 1  . sucralfate (CARAFATE) 1 GM/10ML suspension Take 10 mLs (1 g total) by mouth 4 (four) times daily -  with meals and at bedtime. 420 mL 0   No current facility-administered medications for this visit.     SURGICAL HISTORY:  Past Surgical History:  Procedure Laterality Date  . ABDOMINAL AORTA STENT  07/2011  . ABDOMINAL AORTAGRAM N/A 07/26/2011   Procedure: ABDOMINAL AORTAGRAM;  Surgeon:  Conrad Butte, MD;  Location: [facility] CATH LAB;  Service: Cardiovascular;  Laterality: N/A;  . ABDOMINAL AORTIC ANEURYSM REPAIR  07/2011  . CORONARY ARTERY BYPASS GRAFT  07/2008   CABG X5/notes 07/12/2010  . EMBOLIZATION Left 07/26/2011   Procedure: EMBOLIZATION;  Surgeon: Conrad [place], MD;  Location: [facility] CATH LAB;  Service: Cardiovascular;  Laterality: Left;  . TRANSURETHRAL RESECTION OF PROSTATE  11/29/2011   Procedure: TRANSURETHRAL RESECTION OF THE PROSTATE WITH GYRUS INSTRUMENTS;  Surgeon:  Malka So, MD;  Location: [facility];  Service: Urology;  Laterality: N/A;  Prostate Ultrasound, and Biopsy  . VIDEO BRONCHOSCOPY Bilateral 04/12/2016   Procedure: VIDEO BRONCHOSCOPY WITH FLUORO;  Surgeon: Juanito Doom, MD;  Location: [facility] [facility];  Service: Cardiopulmonary;  Laterality: Bilateral;    REVIEW OF SYSTEMS:  Review of Systems  Constitutional: Negative.   HENT: Negative.   Eyes: Negative.   Respiratory: Negative.   Cardiovascular: Negative.   Gastrointestinal: Negative for abdominal pain, blood in stool, constipation, diarrhea, nausea and vomiting.       Has a sensation of food getting stuck in his esophagus.  Genitourinary: Negative.   Musculoskeletal: Negative.   Skin: Negative.   Neurological: Negative.   Endo/Heme/Allergies: Negative.   Psychiatric/Behavioral: Negative.      PHYSICAL EXAMINATION: Physical Exam  Constitutional: He is oriented to person, place, and time and well-developed, well-nourished, and in no distress. No distress.  HENT:  Head: Normocephalic and atraumatic.  Mouth/Throat: Oropharynx is clear and moist. No oropharyngeal exudate.  Eyes: Pupils are equal, round, and reactive to light. No scleral icterus.  Neck: Normal range of motion. Neck supple. No thyromegaly present.  Cardiovascular: Normal rate and regular rhythm.  Exam reveals no gallop and no friction rub.   No murmur heard. Pulmonary/Chest: Effort normal and breath sounds normal. No respiratory distress. He has no wheezes. He has no rales.  Abdominal: Soft. Bowel sounds are normal. He exhibits no distension and no mass. There is no tenderness.  Musculoskeletal: Normal range of motion. He exhibits no edema.  Lymphadenopathy:    He has no cervical adenopathy.  Neurological: He is alert and oriented to person, place, and time.  Skin: Skin is warm and dry. No rash noted. He is not diaphoretic.  Psychiatric: Mood, memory, affect and judgment normal.  Vitals reviewed.   ECOG PERFORMANCE  STATUS: 1 - Symptomatic but completely ambulatory  Blood pressure (!) 141/89, pulse 61, temperature 97.7 F (36.5 C), temperature source Oral, resp. rate 17, height '6\' 3"'$  (1.905 m), weight 139 lb 1.6 oz (63.1 kg), SpO2 100 %.  LABORATORY DATA: Lab Results  Component Value Date   WBC 4.0 07/02/2016   HGB 11.8 (L) 07/02/2016   HCT 36.6 (L) 07/02/2016   MCV 91.3 07/02/2016   PLT 239 07/02/2016      Chemistry      Component Value Date/Time   NA 140 07/02/2016 1001   K 4.1 07/02/2016 1001   CL 102 05/02/2016 1608   CO2 26 07/02/2016 1001   BUN 19.1 07/02/2016 1001   CREATININE 1.0 07/02/2016 1001      Component Value Date/Time   CALCIUM 9.6 07/02/2016 1001   ALKPHOS 90 07/02/2016 1001   AST 19 07/02/2016 1001   ALT 18 07/02/2016 1001   BILITOT 0.27 07/02/2016 1001       RADIOGRAPHIC STUDIES:  No results found.   ASSESSMENT/PLAN:  No problem-specific Assessment & Plan notes found for this encounter.  This is a 66 year old African-American male  with stage IIIa (T2b, N2, M0) non-small cell lung cancer, squamous cell carcinoma presented with left upper lobe lung mass in addition to the AP window lymphadenopathy diagnosed in February 2018. He is currently receiving concurrent chemoradiation with weekly carboplatin for AUC of 2 and paclitaxel 45 mg meter squared. He tolerated his first cycle chemotherapy well. Labs have been reviewed and are adequate for treatment. Recommend that he proceed with cycle 2 of his chemotherapy as scheduled on 07/03/2016.  The patient started radiation therapy several weeks prior to starting chemotherapy. He is due to complete his radiation early next week. Discussed with Dr. Julien Nordmann and will plan to proceed with the chemotherapy for a total of 3 cycles.  For his radiation esophagitis I prescribed Carafate 4 times a day.  The patient will have a follow-up visit in 2 weeks with Dr. Julien Nordmann.  All questions were answered. The patient knows to call  the clinic with any problems, questions or concerns. We can certainly see the patient much sooner if necessary.   Mikey Bussing, DNP, AGPCNP-BC, AOCNP 07/02/16

## 2016-07-03 ENCOUNTER — Ambulatory Visit: Payer: Medicare Other | Admitting: Nurse Practitioner

## 2016-07-03 ENCOUNTER — Ambulatory Visit
Admission: RE | Admit: 2016-07-03 | Discharge: 2016-07-03 | Disposition: A | Discharge status: Home / Self Care | Payer: Medicare Other | Source: Ambulatory Visit | Attending: Radiation Oncology | Admitting: Radiation Oncology

## 2016-07-03 ENCOUNTER — Ambulatory Visit (HOSPITAL_BASED_OUTPATIENT_CLINIC_OR_DEPARTMENT_OTHER): Payer: Medicare Other

## 2016-07-03 ENCOUNTER — Other Ambulatory Visit: Payer: Medicare Other

## 2016-07-03 ENCOUNTER — Ambulatory Visit: Payer: Medicare Other

## 2016-07-03 VITALS — BP 146/91 | HR 57 | Resp 16

## 2016-07-03 DIAGNOSIS — C3412 Malignant neoplasm of upper lobe, left bronchus or lung: Secondary | ICD-10-CM | POA: Diagnosis not present

## 2016-07-03 DIAGNOSIS — Z510 Encounter for antineoplastic radiation therapy: Secondary | ICD-10-CM | POA: Diagnosis not present

## 2016-07-03 DIAGNOSIS — Z5111 Encounter for antineoplastic chemotherapy: Secondary | ICD-10-CM | POA: Diagnosis present

## 2016-07-03 MED ORDER — SODIUM CHLORIDE 0.9 % IV SOLN
Freq: Once | INTRAVENOUS | Status: AC
  Administered 2016-07-03: 14:00:00 via INTRAVENOUS

## 2016-07-03 MED ORDER — DIPHENHYDRAMINE HCL 50 MG/ML IJ SOLN
50.0000 mg | Freq: Once | INTRAMUSCULAR | Status: AC
Start: 2016-07-03 — End: 2016-07-03
  Administered 2016-07-03: 50 mg via INTRAVENOUS

## 2016-07-03 MED ORDER — SODIUM CHLORIDE 0.9 % IV SOLN
20.0000 mg | Freq: Once | INTRAVENOUS | Status: AC
  Administered 2016-07-03: 20 mg via INTRAVENOUS
  Filled 2016-07-03: qty 2

## 2016-07-03 MED ORDER — FAMOTIDINE IN NACL 20-0.9 MG/50ML-% IV SOLN
20.0000 mg | Freq: Once | INTRAVENOUS | Status: AC
  Administered 2016-07-03: 20 mg via INTRAVENOUS

## 2016-07-03 MED ORDER — PALONOSETRON HCL INJECTION 0.25 MG/5ML
0.2500 mg | Freq: Once | INTRAVENOUS | Status: AC
  Administered 2016-07-03: 0.25 mg via INTRAVENOUS

## 2016-07-03 MED ORDER — SODIUM CHLORIDE 0.9 % IV SOLN
160.0000 mg | Freq: Once | INTRAVENOUS | Status: AC
  Administered 2016-07-03: 160 mg via INTRAVENOUS
  Filled 2016-07-03: qty 16

## 2016-07-03 MED ORDER — PACLITAXEL CHEMO INJECTION 300 MG/50ML
45.0000 mg/m2 | Freq: Once | INTRAVENOUS | Status: AC
  Administered 2016-07-03: 78 mg via INTRAVENOUS
  Filled 2016-07-03: qty 13

## 2016-07-03 NOTE — Patient Instructions (Signed)
[facility] Discharge Instructions for Patients Receiving Chemotherapy  Today you received the following chemotherapy agents Taxol and Carboplatin. To help prevent nausea and vomiting after your treatment, we encourage you to take your nausea medication as directed.  If you develop nausea and vomiting that is not controlled by your nausea medication, call the clinic.   BELOW ARE SYMPTOMS THAT SHOULD BE REPORTED IMMEDIATELY:  *FEVER GREATER THAN 100.5 F  *CHILLS WITH OR WITHOUT FEVER  NAUSEA AND VOMITING THAT IS NOT CONTROLLED WITH YOUR NAUSEA MEDICATION  *UNUSUAL SHORTNESS OF BREATH  *UNUSUAL BRUISING OR BLEEDING  TENDERNESS IN MOUTH AND THROAT WITH OR WITHOUT PRESENCE OF ULCERS  *URINARY PROBLEMS  *BOWEL PROBLEMS  UNUSUAL RASH Items with * indicate a potential emergency and should be followed up as soon as possible.  Feel free to call the clinic you have any questions or concerns. The clinic phone number is [phone].  Please show the CHEMO ALERT CARD at check-in to the Emergency Department and triage nurse.    

## 2016-07-04 ENCOUNTER — Ambulatory Visit: Payer: Medicare Other

## 2016-07-04 ENCOUNTER — Ambulatory Visit
Admission: RE | Admit: 2016-07-04 | Discharge: 2016-07-04 | Disposition: A | Discharge status: Home / Self Care | Payer: Medicare Other | Source: Ambulatory Visit | Attending: Radiation Oncology | Admitting: Radiation Oncology

## 2016-07-04 ENCOUNTER — Other Ambulatory Visit: Payer: Medicare Other

## 2016-07-04 ENCOUNTER — Ambulatory Visit: Payer: Medicare Other | Admitting: Internal Medicine

## 2016-07-04 DIAGNOSIS — Z510 Encounter for antineoplastic radiation therapy: Secondary | ICD-10-CM | POA: Diagnosis not present

## 2016-07-04 DIAGNOSIS — C3492 Malignant neoplasm of unspecified part of left bronchus or lung: Secondary | ICD-10-CM

## 2016-07-04 DIAGNOSIS — C3412 Malignant neoplasm of upper lobe, left bronchus or lung: Secondary | ICD-10-CM | POA: Diagnosis not present

## 2016-07-04 MED ORDER — BIAFINE EX EMUL
Freq: Once | CUTANEOUS | Status: AC
  Administered 2016-07-04: 11:00:00 via TOPICAL

## 2016-07-05 ENCOUNTER — Ambulatory Visit
Admission: RE | Admit: 2016-07-05 | Discharge: 2016-07-05 | Disposition: A | Discharge status: Home / Self Care | Payer: Medicare Other | Source: Ambulatory Visit | Attending: Radiation Oncology | Admitting: Radiation Oncology

## 2016-07-05 ENCOUNTER — Encounter: Payer: Self-pay | Admitting: Radiation Oncology

## 2016-07-05 DIAGNOSIS — C3412 Malignant neoplasm of upper lobe, left bronchus or lung: Secondary | ICD-10-CM | POA: Diagnosis not present

## 2016-07-05 DIAGNOSIS — Z510 Encounter for antineoplastic radiation therapy: Secondary | ICD-10-CM | POA: Diagnosis not present

## 2016-07-09 ENCOUNTER — Telehealth: Payer: Self-pay | Admitting: Internal Medicine

## 2016-07-09 NOTE — Progress Notes (Signed)
  Radiation Oncology         [phone] ________________________________  Name: [name] MRN: [id number]  Date: 07/05/2016  DOB: [birth year]  End of Treatment Note  Diagnosis: Stage IIIa (T2b, N2, M0) non-small cell lung cancer, squamous cell carcinoma, of the left upper lung  Indication for treatment:  Curative with concurrent chemotherapy  Radiation treatment dates:  05/22/16 - 07/05/16  Site/dose:  Left Upper Lung: 60 Gy in 30 fractions  Beams/energy:   3D // Karen Kitchens [employer]  Narrative: The patient tolerated radiation treatment relatively well. The patient denied chest pain, SOB, or cough. The skin of his left upper chest was notable for hyperpigmenation. During treatment, he developed a sensation of food getting stuck in his chest and he was provided with Carafate. He had fatigue and reported occasional scant hemoptysis.  Plan: The patient has completed radiation treatment. The patient will return to radiation oncology clinic for routine followup in one month. I advised them to call or return sooner if they have any questions or concerns related to their recovery or treatment.  -----------------------------------  Blair Promise, PhD, MD  This document serves as a record of services personally performed by Gery Pray, MD. It was created on his behalf by Darcus Austin, a trained medical scribe. The creation of this record is based on the scribe's personal observations and the provider's statements to them. This document has been checked and approved by the attending provider.

## 2016-07-09 NOTE — Telephone Encounter (Signed)
Left message for patient re appointments 5/1 and 5//8.

## 2016-07-10 ENCOUNTER — Ambulatory Visit: Payer: Medicare Other

## 2016-07-10 ENCOUNTER — Other Ambulatory Visit: Payer: Medicare Other

## 2016-07-11 ENCOUNTER — Ambulatory Visit: Payer: Medicare Other

## 2016-07-11 ENCOUNTER — Other Ambulatory Visit: Payer: Medicare Other

## 2016-07-17 ENCOUNTER — Ambulatory Visit: Payer: Medicare Other

## 2016-07-17 ENCOUNTER — Other Ambulatory Visit: Payer: Medicare Other

## 2016-07-17 ENCOUNTER — Other Ambulatory Visit (HOSPITAL_BASED_OUTPATIENT_CLINIC_OR_DEPARTMENT_OTHER): Payer: Medicare Other

## 2016-07-17 ENCOUNTER — Telehealth: Payer: Self-pay | Admitting: Internal Medicine

## 2016-07-17 ENCOUNTER — Ambulatory Visit (HOSPITAL_BASED_OUTPATIENT_CLINIC_OR_DEPARTMENT_OTHER): Payer: Medicare Other | Admitting: Oncology

## 2016-07-17 VITALS — BP 145/86 | HR 61 | Temp 98.2°F | Resp 19 | Ht 75.0 in | Wt 139.0 lb

## 2016-07-17 DIAGNOSIS — C3412 Malignant neoplasm of upper lobe, left bronchus or lung: Secondary | ICD-10-CM | POA: Diagnosis not present

## 2016-07-17 DIAGNOSIS — I255 Ischemic cardiomyopathy: Secondary | ICD-10-CM | POA: Diagnosis not present

## 2016-07-17 DIAGNOSIS — J449 Chronic obstructive pulmonary disease, unspecified: Secondary | ICD-10-CM | POA: Diagnosis not present

## 2016-07-17 DIAGNOSIS — C3492 Malignant neoplasm of unspecified part of left bronchus or lung: Secondary | ICD-10-CM

## 2016-07-17 LAB — COMPREHENSIVE METABOLIC PANEL
ALT: 14 U/L (ref 0–55)
ANION GAP: 8 meq/L (ref 3–11)
AST: 18 U/L (ref 5–34)
Albumin: 3.4 g/dL — ABNORMAL LOW (ref 3.5–5.0)
Alkaline Phosphatase: 80 U/L (ref 40–150)
BILIRUBIN TOTAL: 0.36 mg/dL (ref 0.20–1.20)
BUN: 16.4 mg/dL (ref 7.0–26.0)
CALCIUM: 9.2 mg/dL (ref 8.4–10.4)
CHLORIDE: 109 meq/L (ref 98–109)
CO2: 25 meq/L (ref 22–29)
CREATININE: 1 mg/dL (ref 0.7–1.3)
EGFR: 88 mL/min/{1.73_m2} — AB (ref >90)
Glucose: 92 mg/dl (ref 70–140)
Potassium: 4.1 mEq/L (ref 3.5–5.1)
Sodium: 142 mEq/L (ref 136–145)
Total Protein: 7.2 g/dL (ref 6.4–8.3)

## 2016-07-17 LAB — CBC WITH DIFFERENTIAL/PLATELET
BASO%: 0.7 % (ref 0.0–2.0)
Basophils Absolute: 0 10*3/uL (ref 0.0–0.1)
EOS%: 3.1 % (ref 0.0–7.0)
Eosinophils Absolute: 0.1 10*3/uL (ref 0.0–0.5)
HCT: 35.1 % — ABNORMAL LOW (ref 38.4–49.9)
HGB: 11.6 g/dL — ABNORMAL LOW (ref 13.0–17.1)
LYMPH%: 9.6 % — AB (ref 14.0–49.0)
MCH: 30.6 pg (ref 27.2–33.4)
MCHC: 33.1 g/dL (ref 32.0–36.0)
MCV: 92.5 fL (ref 79.3–98.0)
MONO#: 0.6 10*3/uL (ref 0.1–0.9)
MONO%: 14.8 % — AB (ref 0.0–14.0)
NEUT%: 71.8 % (ref 39.0–75.0)
NEUTROS ABS: 2.9 10*3/uL (ref 1.5–6.5)
Platelets: 207 10*3/uL (ref 140–400)
RBC: 3.79 10*6/uL — AB (ref 4.20–5.82)
RDW: 18.2 % — ABNORMAL HIGH (ref 11.0–14.6)
WBC: 4 10*3/uL (ref 4.0–10.3)
lymph#: 0.4 10*3/uL — ABNORMAL LOW (ref 0.9–3.3)

## 2016-07-17 NOTE — Telephone Encounter (Signed)
Appointments scheduled per 07/17/16 los. Patient was given a copy of the AVS report and appointment schedule per 07/17/16 los.

## 2016-07-17 NOTE — Progress Notes (Signed)
No images are attached to the encounter. No scans are attached to the encounter. No scans are attached to the encounter. [place] SHARED VISIT PROGRESS NOTE  Adrian Borg, MD [place] [address]  DIAGNOSIS: Stage IIIa (T2b, N2, M0) non-small cell lung cancer, squamous cell carcinoma presented with left upper lobe lung mass in addition to AP window lymphadenopathy diagnosed in February 2018.  PRIOR THERAPY: None  CURRENT THERAPY: Concurrent chemoradiation therapy. He received radiation for approximately 2-3 weeks prior to starting his chemotherapy. He completed his radiation on 07/05/2016. He received 2 doses of weekly carboplatin for an AUC of 2 and paclitaxel 45 mg meter squared which was last given on 07/03/2016.  INTERVAL HISTORY: [name] 66 y.o. male returns routine follow-up of his lung cancer. He has now completed his radiation and chemotherapy. The patient was given Carafate at his last visit due to difficulty swallowing. He reports that his difficulty swallowing has resolved. He is eating well and his weight remained stable. He denies fevers and chills. Denies cough, hemoptysis, shortness of breath, dyspnea on exertion, and chest pain. He denies nausea, vomiting, constipation, and diarrhea. Denies neuropathy. The patient is here to discuss further plan related to his lung cancer.   MEDICAL HISTORY: Past Medical History:  Diagnosis Date  . AAA (abdominal aortic aneurysm) ([place])   . Abdominal aortic aneurysm ([place])    AAA And bilateral common iliac artery aneurysms  . BPH (benign prostatic hyperplasia)   . CAD (coronary artery disease)    Dr Ron Parker @ [facility]   . Chronic mental illness 04/21/2011   Diagnosis unclear,  Family provides good care  . COPD (chronic obstructive pulmonary disease) ([place]) 04/21/2011   smoking  . Ejection fraction    EF 35-40%, echo, May, 2010  . Hyperlipidemia   . Hyperplastic colon polyp 04/21/2011  . Hypertension    . Preop cardiovascular exam    Cardiac clearance for major vascular surgery, May, 2013  . Pulmonary nodule    Chest CT May, 2013, 2 small pulmonary nodules, needs appropriate followup,  this CT was not ordered by the cardiology team  . PVD (peripheral vascular disease) ([place]) 04/21/2011  . Retention of urine   . Shortness of breath   . Squamous cell lung cancer, left ( [place]) 06/13/2016  . Tobacco abuse     ALLERGIES:  is allergic to ace inhibitors.  MEDICATIONS:  Current Outpatient Prescriptions  Medication Sig Dispense Refill  . aspirin EC 81 MG tablet Take 81 mg by mouth daily.    [name] emollient (BIAFINE) cream Apply topically as needed.    . prochlorperazine (COMPAZINE) 10 MG tablet Take 1 tablet (10 mg total) by mouth every 6 (six) hours as needed for nausea or vomiting. 30 tablet 0  . rosuvastatin (CRESTOR) 40 MG tablet TAKE 1 TABLET BY MOUTH EVERY DAY 90 tablet 0  . sucralfate (CARAFATE) 1 GM/10ML suspension Take 10 mLs (1 g total) by mouth 4 (four) times daily -  with meals and at bedtime. 420 mL 0   No current facility-administered medications for this visit.     SURGICAL HISTORY:  Past Surgical History:  Procedure Laterality Date  . ABDOMINAL AORTA STENT  07/2011  . ABDOMINAL AORTAGRAM N/A 07/26/2011   Procedure: ABDOMINAL Maxcine Ham;  Surgeon: Conrad Hanford, MD;  Location: [facility] CATH LAB;  Service: Cardiovascular;  Laterality: N/A;  . ABDOMINAL AORTIC ANEURYSM REPAIR  07/2011  . CORONARY ARTERY BYPASS GRAFT  07/2008   CABG X5/notes 07/12/2010  . EMBOLIZATION Left 07/26/2011   Procedure: EMBOLIZATION;  Surgeon: Conrad [place], MD;  Location: [facility] CATH LAB;  Service: Cardiovascular;  Laterality: Left;  . TRANSURETHRAL RESECTION OF PROSTATE  11/29/2011   Procedure: TRANSURETHRAL RESECTION OF THE PROSTATE WITH GYRUS INSTRUMENTS;  Surgeon: Malka So, MD;  Location: [facility];  Service: Urology;  Laterality: N/A;  Prostate Ultrasound, and Biopsy  . VIDEO BRONCHOSCOPY Bilateral 04/12/2016    Procedure: VIDEO BRONCHOSCOPY WITH FLUORO;  Surgeon: Juanito Doom, MD;  Location: [facility];  Service: Cardiopulmonary;  Laterality: Bilateral;    REVIEW OF SYSTEMS:  Review of Systems  Constitutional: Negative.   HENT: Negative.   Eyes: Negative.   Respiratory: Negative.   Cardiovascular: Negative.   Gastrointestinal: Negative.   Genitourinary: Negative.   Musculoskeletal: Negative.   Skin: Negative.   Neurological: Negative.   Endo/Heme/Allergies: Negative.   Psychiatric/Behavioral: Negative.      PHYSICAL EXAMINATION: Physical Exam  Constitutional: He is oriented to person, place, and time and well-developed, well-nourished, and in no distress. No distress.  HENT:  Head: Normocephalic.  Mouth/Throat: Oropharynx is clear and moist. No oropharyngeal exudate.  Eyes: Conjunctivae and EOM are normal. Pupils are equal, round, and reactive to light. No scleral icterus.  Neck: Normal range of motion. Neck supple. No tracheal deviation present. No thyromegaly present.  Cardiovascular: Normal rate, regular rhythm and intact distal pulses.  Exam reveals no friction rub.   No murmur heard. Pulmonary/Chest: Effort normal and breath sounds normal. No respiratory distress. He has no wheezes. He has no rales.  Abdominal: Soft. Bowel sounds are normal. He exhibits no distension and no mass. There is no tenderness.  Musculoskeletal: Normal range of motion. He exhibits no edema.  Lymphadenopathy:    He has no cervical adenopathy.  Neurological: He is alert and oriented to person, place, and time. He displays normal reflexes. Gait normal.  Skin: Skin is warm and dry. No rash noted. He is not diaphoretic. No erythema.  Psychiatric: Mood, memory, affect and judgment normal.  Vitals reviewed.   ECOG PERFORMANCE STATUS: 1 - Symptomatic but completely ambulatory  Blood pressure (!) 145/86, pulse 61, temperature 98.2 F (36.8 C), temperature source Oral, resp. rate 19, height '6\' 3"'$  (1.905  m), weight 139 lb (63 kg), SpO2 99 %.  LABORATORY DATA: Lab Results  Component Value Date   WBC 4.0 07/17/2016   HGB 11.6 (L) 07/17/2016   HCT 35.1 (L) 07/17/2016   MCV 92.5 07/17/2016   PLT 207 07/17/2016      Chemistry      Component Value Date/Time   NA 140 07/02/2016 1001   K 4.1 07/02/2016 1001   CL 102 05/02/2016 1608   CO2 26 07/02/2016 1001   BUN 19.1 07/02/2016 1001   CREATININE 1.0 07/02/2016 1001      Component Value Date/Time   CALCIUM 9.6 07/02/2016 1001   ALKPHOS 90 07/02/2016 1001   AST 19 07/02/2016 1001   ALT 18 07/02/2016 1001   BILITOT 0.27 07/02/2016 1001       RADIOGRAPHIC STUDIES:  No results found.   ASSESSMENT/PLAN:  No problem-specific Assessment & Plan notes found for this encounter.  This is a 66 year old African-American male with stage IIIa (T2b, N2, M0) non-small cell lung cancer, squamous cell presented with left upper lobe lung mass in addition to the AP window lymphadenopathy diagnosed in February 2018. The patient is status post chemoradiation therapy. He received only 2 doses of  chemotherapy since he started several weeks after the radiation started. He tolerated his chemotherapy well. The patient developed dysphagia due to his radiation therapy. He was started on Carafate and his symptoms have resolved.  The patient will have a CT of the chest performed in approximately 3 weeks. Once we have the results of that test, we will consider starting him on immunotherapy.  I have ordered a CT of the chest to be performed in approximate 3 weeks with a follow-up visit several days after that CT scan to discuss the results.  All questions were answered. The patient knows to call the clinic with any problems, questions or concerns. We can certainly see the patient much sooner if necessary.  Plan reviewed with Dr. Julien Nordmann.   Mikey Bussing, DNP, AGPCNP-BC, AOCNP 07/17/16

## 2016-08-08 ENCOUNTER — Ambulatory Visit (HOSPITAL_COMMUNITY)
Admission: RE | Admit: 2016-08-08 | Discharge: 2016-08-08 | Disposition: A | Discharge status: Home / Self Care | Payer: Medicare Other | Source: Ambulatory Visit | Attending: Oncology | Admitting: Oncology

## 2016-08-08 ENCOUNTER — Encounter (HOSPITAL_COMMUNITY): Payer: Self-pay

## 2016-08-08 DIAGNOSIS — C3412 Malignant neoplasm of upper lobe, left bronchus or lung: Secondary | ICD-10-CM | POA: Insufficient documentation

## 2016-08-08 DIAGNOSIS — R590 Localized enlarged lymph nodes: Secondary | ICD-10-CM | POA: Diagnosis not present

## 2016-08-08 DIAGNOSIS — I700 Atherosclerosis of aorta: Secondary | ICD-10-CM | POA: Insufficient documentation

## 2016-08-08 DIAGNOSIS — R918 Other nonspecific abnormal finding of lung field: Secondary | ICD-10-CM | POA: Diagnosis not present

## 2016-08-08 MED ORDER — IOPAMIDOL (ISOVUE-300) INJECTION 61%
75.0000 mL | Freq: Once | INTRAVENOUS | Status: AC | PRN
  Administered 2016-08-08: 75 mL via INTRAVENOUS

## 2016-08-08 MED ORDER — IOPAMIDOL (ISOVUE-300) INJECTION 61%
INTRAVENOUS | Status: AC
  Filled 2016-08-08: qty 75

## 2016-08-13 ENCOUNTER — Ambulatory Visit
Admission: RE | Admit: 2016-08-13 | Discharge: 2016-08-13 | Disposition: A | Discharge status: Home / Self Care | Payer: Medicare Other | Source: Ambulatory Visit | Attending: Radiation Oncology | Admitting: Radiation Oncology

## 2016-08-13 ENCOUNTER — Ambulatory Visit: Payer: Medicare Other | Admitting: Internal Medicine

## 2016-08-13 ENCOUNTER — Ambulatory Visit (HOSPITAL_BASED_OUTPATIENT_CLINIC_OR_DEPARTMENT_OTHER): Payer: Medicare Other | Admitting: Internal Medicine

## 2016-08-13 ENCOUNTER — Encounter: Payer: Self-pay | Admitting: Internal Medicine

## 2016-08-13 ENCOUNTER — Telehealth: Payer: Self-pay | Admitting: Internal Medicine

## 2016-08-13 VITALS — BP 117/83 | HR 76 | Temp 98.2°F | Resp 20 | Ht 75.0 in | Wt 136.3 lb

## 2016-08-13 VITALS — BP 127/89 | HR 74 | Temp 98.1°F | Ht 75.0 in | Wt 136.2 lb

## 2016-08-13 DIAGNOSIS — C3412 Malignant neoplasm of upper lobe, left bronchus or lung: Secondary | ICD-10-CM

## 2016-08-13 DIAGNOSIS — R5382 Chronic fatigue, unspecified: Secondary | ICD-10-CM | POA: Diagnosis not present

## 2016-08-13 DIAGNOSIS — I255 Ischemic cardiomyopathy: Secondary | ICD-10-CM

## 2016-08-13 DIAGNOSIS — Z510 Encounter for antineoplastic radiation therapy: Secondary | ICD-10-CM | POA: Diagnosis not present

## 2016-08-13 DIAGNOSIS — C3492 Malignant neoplasm of unspecified part of left bronchus or lung: Secondary | ICD-10-CM

## 2016-08-13 NOTE — Telephone Encounter (Signed)
Labs scheduled every 2 weeks starting 08/22/16, per 08/13/16 los. Chemotherapy [facility])  Every 2 weeks to start on 08/22/16, per 08/13/16 los. Follow up visit scheduled for 09/05/16 and then every 2 weeks with Chemo, per 08/13/16 los. Patient was given a copy of the AVS report and appointment schedule, per 08/13/16 los.

## 2016-08-13 NOTE — Progress Notes (Signed)
[name] Telephone:([phone]) [phone]   Fax:(336) [phone]  OFFICE PROGRESS NOTE  Adrian Borg, MD [place] [address]  DIAGNOSIS: Stage IIIa (T2b, N2, M0) non-small cell lung cancer, squamous cell carcinoma presented with left upper lobe lung mass in addition to AP window lymphadenopathy diagnosed in February 2018.  PRIOR THERAPY: Concurrent chemoradiation therapy. He received radiation for approximately 2-3 weeks prior to starting his chemotherapy. He completed his radiation on 07/05/2016. He received 2 doses of weekly carboplatin for an AUC of 2 and paclitaxel 45 mg meter squared which was last given on 07/03/2016.  CURRENT THERAPY:  consolidation treatment with immunotherapy with Imfinzi (Durvalumab) 10 MG/KG every 2 weeks. First dose 08/22/2016.  INTERVAL HISTORY: [name] 66 y.o. male returns to the clinic today for returns to the clinic today for follow-up visit accompanied by his sister. The patient is feeling fine today with no specific complaints. He tolerated the previous course of concurrent chemoradiation fairly well. He denied having any chest pain, shortness of breath, cough or hemoptysis. He has no fever or chills. He has no nausea, vomiting, diarrhea or constipation. She missed a few appointments of his chemotherapy. He had repeat CT scan of the chest performed recently and he is here for evaluation and discussion of his scan results.  MEDICAL HISTORY: Past Medical History:  Diagnosis Date  . AAA (abdominal aortic aneurysm) ([name])   . Abdominal aortic aneurysm ([place])    AAA And bilateral common iliac artery aneurysms  . BPH (benign prostatic hyperplasia)   . CAD (coronary artery disease)    Dr Ron Parker @ [facility]   . Chronic mental illness 04/21/2011   Diagnosis unclear,  Family provides good care  . COPD (chronic obstructive pulmonary disease) ([name]) 04/21/2011   smoking  . Ejection fraction    EF 35-40%, echo, May, 2010  .  Hyperlipidemia   . Hyperplastic colon polyp 04/21/2011  . Hypertension   . Preop cardiovascular exam    Cardiac clearance for major vascular surgery, May, 2013  . Pulmonary nodule    Chest CT May, 2013, 2 small pulmonary nodules, needs appropriate followup,  this CT was not ordered by the cardiology team  . PVD (peripheral vascular disease) ([place]) 04/21/2011  . Retention of urine   . Shortness of breath   . Squamous cell lung cancer, left ([name]) 06/13/2016  . Tobacco abuse     ALLERGIES:  is allergic to ace inhibitors.  MEDICATIONS:  Current Outpatient Prescriptions  Medication Sig Dispense Refill  . aspirin EC 81 MG tablet Take 81 mg by mouth daily.    [name] emollient (BIAFINE) cream Apply topically as needed.    . prochlorperazine (COMPAZINE) 10 MG tablet Take 1 tablet (10 mg total) by mouth every 6 (six) hours as needed for nausea or vomiting. 30 tablet 0  . rosuvastatin (CRESTOR) 40 MG tablet TAKE 1 TABLET BY MOUTH EVERY DAY 90 tablet 0  . sucralfate (CARAFATE) 1 GM/10ML suspension Take 10 mLs (1 g total) by mouth 4 (four) times daily -  with meals and at bedtime. 420 mL 0   No current facility-administered medications for this visit.     SURGICAL HISTORY:  Past Surgical History:  Procedure Laterality Date  . ABDOMINAL AORTA STENT  07/2011  . ABDOMINAL AORTAGRAM N/A 07/26/2011   Procedure: ABDOMINAL Maxcine Ham;  Surgeon: Conrad Riverton, MD;  Location: [facility] CATH LAB;  Service: Cardiovascular;  Laterality: N/A;  .  ABDOMINAL AORTIC ANEURYSM REPAIR  07/2011  . CORONARY ARTERY BYPASS GRAFT  07/2008   CABG X5/notes 07/12/2010  . EMBOLIZATION Left 07/26/2011   Procedure: EMBOLIZATION;  Surgeon: Conrad Big Lake, MD;  Location: [facility] CATH LAB;  Service: Cardiovascular;  Laterality: Left;  . TRANSURETHRAL RESECTION OF PROSTATE  11/29/2011   Procedure: TRANSURETHRAL RESECTION OF THE PROSTATE WITH GYRUS INSTRUMENTS;  Surgeon: Adrian So, MD;  Location: [facility] ORS;  Service: Urology;  Laterality: N/A;  Prostate  Ultrasound, and Biopsy  . VIDEO BRONCHOSCOPY Bilateral 04/12/2016   Procedure: VIDEO BRONCHOSCOPY WITH FLUORO;  Surgeon: Adrian Doom, MD;  Location: [facility];  Service: Cardiopulmonary;  Laterality: Bilateral;    REVIEW OF SYSTEMS:  Constitutional: positive for fatigue and weight loss Eyes: negative Ears, nose, mouth, throat, and face: negative Respiratory: negative Cardiovascular: negative Gastrointestinal: negative Genitourinary:negative Integument/breast: negative Hematologic/lymphatic: negative Musculoskeletal:negative Neurological: negative Behavioral/Psych: negative Endocrine: negative Allergic/Immunologic: negative   PHYSICAL EXAMINATION: General appearance: alert, cooperative, fatigued and no distress Head: Normocephalic, without obvious abnormality, atraumatic Neck: no adenopathy, no JVD, supple, symmetrical, trachea midline and thyroid not enlarged, symmetric, no tenderness/mass/nodules Lymph nodes: Cervical, supraclavicular, and axillary nodes normal. Resp: clear to auscultation bilaterally Back: symmetric, no curvature. ROM normal. No CVA tenderness. Cardio: regular rate and rhythm, S1, S2 normal, no murmur, click, rub or gallop GI: soft, non-tender; bowel sounds normal; no masses,  no organomegaly Extremities: extremities normal, atraumatic, no cyanosis or edema Neurologic: Alert and oriented X 3, normal strength and tone. Normal symmetric reflexes. Normal coordination and gait  ECOG PERFORMANCE STATUS: 1 - Symptomatic but completely ambulatory  Blood pressure 117/83, pulse 76, temperature 98.2 F (36.8 C), temperature source Oral, resp. rate 20, height 6\' 3"  (1.905 m), weight 136 lb 4.8 oz (61.8 kg), SpO2 96 %.  LABORATORY DATA: Lab Results  Component Value Date   WBC 4.0 07/17/2016   HGB 11.6 (L) 07/17/2016   HCT 35.1 (L) 07/17/2016   MCV 92.5 07/17/2016   PLT 207 07/17/2016      Chemistry      Component Value Date/Time   NA 142 07/17/2016 1105     K 4.1 07/17/2016 1105   CL 102 05/02/2016 1608   CO2 25 07/17/2016 1105   BUN 16.4 07/17/2016 1105   CREATININE 1.0 07/17/2016 1105      Component Value Date/Time   CALCIUM 9.2 07/17/2016 1105   ALKPHOS 80 07/17/2016 1105   AST 18 07/17/2016 1105   ALT 14 07/17/2016 1105   BILITOT 0.36 07/17/2016 1105       RADIOGRAPHIC STUDIES: Ct Chest W Contrast  Result Date: 08/08/2016 CLINICAL DATA:  Status post chemotherapy and radiation therapy for lung cancer. Non-small-cell diagnosed 2/18. EXAM: CT CHEST WITH CONTRAST TECHNIQUE: Multidetector CT imaging of the chest was performed during intravenous contrast administration. CONTRAST:  67mL ISOVUE-300 IOPAMIDOL (ISOVUE-300) INJECTION 61% COMPARISON:  04/25/2016 PET.  03/28/2016 chest CT. FINDINGS: Cardiovascular: Aortic and branch vessel atherosclerosis. Normal heart size, without pericardial effusion. Median sternotomy for CABG. No central pulmonary embolism, on this non-dedicated study. Mediastinum/Nodes: No supraclavicular adenopathy. Borderline AP window adenopathy at 10 mm on image 67/series 2. Not well evaluated on prior noncontrast exams. No hilar adenopathy. Lungs/Pleura: Bullous type emphysema. Lower lobe predominant bronchial wall thickening. Right lower lobe scarring. Spiculated cavitary left upper lobe lung mass measures 3.1 x 3.2 cm on image 36/series 5. Compare 3.7 x 4.8 cm on the prior. More cephalad left apical heterogeneous consolidation is most likely radiation induced, and is new. 5  mm left lower lobe pulmonary nodule is unchanged on image 100/series 5. Upper Abdomen: Motion degradation in the upper abdomen. Reflux of contrast into the hepatic veins suggests elevated right heart pressures. Normal imaged portions of the spleen, stomach, pancreas, left kidney. Interpolar right renal low-density lesion is likely a cyst. Adrenal glands not well evaluated. Incompletely imaged abdominal aortic stent graft. Musculoskeletal: No acute osseous  abnormality. IMPRESSION: 1. Decreased size of left upper lobe cavitary lung mass. 2. Borderline AP window adenopathy is not well evaluated on prior exam and indeterminate. Recommend attention on follow-up. 3.  Aortic atherosclerosis. 4. Motion degraded evaluation of the upper abdomen. Electronically Signed   By: Abigail Miyamoto M.D.   On: 08/08/2016 11:26    ASSESSMENT AND PLAN: This is a very pleasant 66 years old African-American male with unresectable a stage IIIa non-small cell lung cancer status post a course of concurrent chemoradiation with weekly carboplatin and paclitaxel. He tolerated his treatment well. He has repeat CT scan of the chest performed recently. I percent and independently reviewed the scan and discuss the results with the patient and his sister. His CT scan showed improvement of his disease. I discussed with the patient his treatment options including close observation versus consideration of consolidation treatment with immunotherapy with Imfinzi (Durvalumab) 10 MG/KG every 2 weeks. I discussed with the patient his sister the adverse effect of this treatment including but not limited to immune mediated pneumonitis, skin rash, diarrhea, or inflammation of the kidney, liver, thyroid or other endocrine dysfunction. The patient would like to proceed with the consolidation treatment with immunotherapy. He is expected to start the first cycle of this treatment on 08/22/2016. I will see him back for follow-up visit in 3 weeks for evaluation with the start of cycle #2. He was advised to call immediately if he has any concerning symptoms in the interval. The patient voices understanding of current disease status and treatment options and is in agreement with the current care plan.  All questions were answered. The patient knows to call the clinic with any problems, questions or concerns. We can certainly see the patient much sooner if necessary.   Disclaimer: This note was dictated  with voice recognition software. Similar sounding words can inadvertently be transcribed and may not be corrected upon review.

## 2016-08-13 NOTE — Progress Notes (Signed)
Radiation Oncology         [phone] ________________________________  Name: [name] MRN: [id number]  Date: 08/13/2016  DOB: 03-Sep-1950  Follow-Up Visit Note  CC: [name], MD  Grace Isaac, MD    ICD-9-CM ICD-10-CM   1. Malignant neoplasm of upper lobe of left lung (HCC) 162.3 C34.12   2. Squamous cell lung cancer, left (HCC) 162.9 C34.92     Diagnosis:  Stage IIIa (T2b, N2, M0) non-small cell lung cancer, squamous cell carcinoma, of the left upper lung  Interval Since Last Radiation: 5  weeks   05/22/16 - 07/05/16: Left Upper Lung: 60 Gy in 30 fractions  Narrative:  The patient returns today for routine follow-up. CT of the chest on 07/29/16 showed a decrease in size of the left upper lobe cavitary lung mass. The patient saw Dr. Julien Nordmann earlier this morning. The plan is to proceed with consolidation treatment with immunotherapy. The first cycle of this treatment is on 08/22/16.  The patient denies pain, difficulty swallowing, a cough, or hemoptysis. He reports SOB, especially when he needs to bend over, and fatigue.  ALLERGIES:  is allergic to ace inhibitors.  Meds: Current Outpatient Prescriptions  Medication Sig Dispense Refill  . aspirin EC 81 MG tablet Take 81 mg by mouth daily.    . rosuvastatin (CRESTOR) 40 MG tablet TAKE 1 TABLET BY MOUTH EVERY DAY 90 tablet 0  . prochlorperazine (COMPAZINE) 10 MG tablet Take 1 tablet (10 mg total) by mouth every 6 (six) hours as needed for nausea or vomiting. (Patient not taking: Reported on 08/13/2016) 30 tablet 0  . sucralfate (CARAFATE) 1 GM/10ML suspension Take 10 mLs (1 g total) by mouth 4 (four) times daily -  with meals and at bedtime. (Patient not taking: Reported on 08/13/2016) 420 mL 0   No current facility-administered medications for this encounter.     Physical Findings: The patient is in no acute distress. Patient is alert and oriented.  height is 6\' 3"  (1.905 m) and weight is 136 lb 3.2 oz (61.8 kg). His  oral temperature is 98.1 F (36.7 C). His blood pressure is 127/89 and his pulse is 74. His oxygen saturation is 98%.   Lungs are clear to auscultation bilaterally. Heart has regular rate and rhythm.  Lab Findings: Lab Results  Component Value Date   WBC 4.0 07/17/2016   HGB 11.6 (L) 07/17/2016   HCT 35.1 (L) 07/17/2016   MCV 92.5 07/17/2016   PLT 207 07/17/2016    Radiographic Findings: Ct Chest W Contrast  Result Date: 08/08/2016 CLINICAL DATA:  Status post chemotherapy and radiation therapy for lung cancer. Non-small-cell diagnosed 2/18. EXAM: CT CHEST WITH CONTRAST TECHNIQUE: Multidetector CT imaging of the chest was performed during intravenous contrast administration. CONTRAST:  22mL ISOVUE-300 IOPAMIDOL (ISOVUE-300) INJECTION 61% COMPARISON:  04/25/2016 PET.  03/28/2016 chest CT. FINDINGS: Cardiovascular: Aortic and branch vessel atherosclerosis. Normal heart size, without pericardial effusion. Median sternotomy for CABG. No central pulmonary embolism, on this non-dedicated study. Mediastinum/Nodes: No supraclavicular adenopathy. Borderline AP window adenopathy at 10 mm on image 67/series 2. Not well evaluated on prior noncontrast exams. No hilar adenopathy. Lungs/Pleura: Bullous type emphysema. Lower lobe predominant bronchial wall thickening. Right lower lobe scarring. Spiculated cavitary left upper lobe lung mass measures 3.1 x 3.2 cm on image 36/series 5. Compare 3.7 x 4.8 cm on the prior. More cephalad left apical heterogeneous consolidation is most likely radiation induced, and is new. 5 mm left lower lobe pulmonary  nodule is unchanged on image 100/series 5. Upper Abdomen: Motion degradation in the upper abdomen. Reflux of contrast into the hepatic veins suggests elevated right heart pressures. Normal imaged portions of the spleen, stomach, pancreas, left kidney. Interpolar right renal low-density lesion is likely a cyst. Adrenal glands not well evaluated. Incompletely imaged abdominal  aortic stent graft. Musculoskeletal: No acute osseous abnormality. IMPRESSION: 1. Decreased size of left upper lobe cavitary lung mass. 2. Borderline AP window adenopathy is not well evaluated on prior exam and indeterminate. Recommend attention on follow-up. 3.  Aortic atherosclerosis. 4. Motion degraded evaluation of the upper abdomen. Electronically Signed   By: Abigail Miyamoto M.D.   On: 08/08/2016 11:26    Impression:  The patient is recovering from the effects of chemoradiation. CT of the chest on 08/08/16 showed a decrease in size of the left upper lobe cavitary lung mass.  Plan: The patient is scheduled to start consolidation treatment with immunotherapy (Imfinzi (Durvalumab) 10 MG/KG) every 2 weeks on 08/22/2016 under the care of Dr. Julien Nordmann. The patient will continue treatments and follow up with medical oncology and he will see me on an as needed basis. It was a pleasure treating this patient. He knows to call and present to the clinic if he has any questions or concerns about radiation. ____________________________________ -----------------------------------  Blair Promise, PhD, MD  This document serves as a record of services personally performed by Gery Pray, MD. It was created on his behalf by Darcus Austin, a trained medical scribe. The creation of this record is based on the scribe's personal observations and the provider's statements to them. This document has been checked and approved by the attending provider.

## 2016-08-13 NOTE — Progress Notes (Signed)
Eilan is here for follow up. He denies having any pain or trouble swallowing.  He reports having shortness of breath especially when he bends over to pick something up.  He denies having a cough or hemoptysis.  He reports having fatigue.  He saw Dr. Julien Nordmann today and is going to start having immunotherapy every 2 weeks.    BP 127/89 (BP Location: Left Arm, Patient Position: Sitting)   Pulse 74   Temp 98.1 F (36.7 C) (Oral)   Ht 6\' 3"  (1.905 m)   Wt 136 lb 3.2 oz (61.8 kg)   SpO2 98%   BMI 17.02 kg/m    Wt Readings from Last 3 Encounters:  08/13/16 136 lb 3.2 oz (61.8 kg)  08/13/16 136 lb 4.8 oz (61.8 kg)  07/17/16 139 lb (63 kg)

## 2016-08-13 NOTE — Progress Notes (Signed)
DISCONTINUE ON PATHWAY REGIMEN - Non-Small Cell Lung     Administer weekly:     Paclitaxel      Carboplatin   **Always confirm dose/schedule in your pharmacy ordering system**    REASON: Continuation Of Treatment PRIOR TREATMENT: [id number]: Carboplatin AUC=2 + Paclitaxel 45 mg/m2 Weekly During Radiation TREATMENT RESPONSE: Partial Response (PR)  START ON PATHWAY REGIMEN - Non-Small Cell Lung     A cycle is every 14 days:     Durvalumab   **Always confirm dose/schedule in your pharmacy ordering system**    Patient Characteristics: Stage III - Unresectable, PS = 0, 1 AJCC T Category: T2b Current Disease Status: No Distant Mets or Local Recurrence AJCC N Category: N2 AJCC M Category: M0 AJCC 8 Stage Grouping: IIIA Performance Status: PS = 0, 1  Intent of Therapy: Curative Intent, Discussed with Patient

## 2016-08-22 ENCOUNTER — Other Ambulatory Visit (HOSPITAL_BASED_OUTPATIENT_CLINIC_OR_DEPARTMENT_OTHER): Payer: Medicare Other

## 2016-08-22 ENCOUNTER — Ambulatory Visit (HOSPITAL_BASED_OUTPATIENT_CLINIC_OR_DEPARTMENT_OTHER): Payer: Medicare Other

## 2016-08-22 VITALS — BP 147/90 | HR 64 | Temp 98.4°F | Resp 18

## 2016-08-22 DIAGNOSIS — R5382 Chronic fatigue, unspecified: Secondary | ICD-10-CM

## 2016-08-22 DIAGNOSIS — C3492 Malignant neoplasm of unspecified part of left bronchus or lung: Secondary | ICD-10-CM

## 2016-08-22 DIAGNOSIS — E785 Hyperlipidemia, unspecified: Secondary | ICD-10-CM

## 2016-08-22 DIAGNOSIS — Z5112 Encounter for antineoplastic immunotherapy: Secondary | ICD-10-CM

## 2016-08-22 DIAGNOSIS — C3412 Malignant neoplasm of upper lobe, left bronchus or lung: Secondary | ICD-10-CM

## 2016-08-22 LAB — CBC WITH DIFFERENTIAL/PLATELET
BASO%: 0.4 % (ref 0.0–2.0)
BASOS ABS: 0 10*3/uL (ref 0.0–0.1)
EOS ABS: 0.2 10*3/uL (ref 0.0–0.5)
EOS%: 3.3 % (ref 0.0–7.0)
HCT: 36 % — ABNORMAL LOW (ref 38.4–49.9)
HEMOGLOBIN: 11.3 g/dL — AB (ref 13.0–17.1)
LYMPH#: 0.9 10*3/uL (ref 0.9–3.3)
LYMPH%: 15.8 % (ref 14.0–49.0)
MCH: 29.2 pg (ref 27.2–33.4)
MCHC: 31.4 g/dL — AB (ref 32.0–36.0)
MCV: 93 fL (ref 79.3–98.0)
MONO#: 0.4 10*3/uL (ref 0.1–0.9)
MONO%: 8.1 % (ref 0.0–14.0)
NEUT#: 4 10*3/uL (ref 1.5–6.5)
NEUT%: 72.4 % (ref 39.0–75.0)
PLATELETS: 237 10*3/uL (ref 140–400)
RBC: 3.87 10*6/uL — ABNORMAL LOW (ref 4.20–5.82)
RDW: 16.5 % — AB (ref 11.0–14.6)
WBC: 5.5 10*3/uL (ref 4.0–10.3)

## 2016-08-22 LAB — COMPREHENSIVE METABOLIC PANEL
ALBUMIN: 3.1 g/dL — AB (ref 3.5–5.0)
ALT: 14 U/L (ref 0–55)
AST: 19 U/L (ref 5–34)
Alkaline Phosphatase: 100 U/L (ref 40–150)
Anion Gap: 11 mEq/L (ref 3–11)
BUN: 17.3 mg/dL (ref 7.0–26.0)
CALCIUM: 9.6 mg/dL (ref 8.4–10.4)
CO2: 23 mEq/L (ref 22–29)
CREATININE: 1.2 mg/dL (ref 0.7–1.3)
Chloride: 108 mEq/L (ref 98–109)
EGFR: 75 mL/min/{1.73_m2} — ABNORMAL LOW (ref >90)
Glucose: 92 mg/dl (ref 70–140)
POTASSIUM: 4 meq/L (ref 3.5–5.1)
Sodium: 142 mEq/L (ref 136–145)
Total Bilirubin: 0.31 mg/dL (ref 0.20–1.20)
Total Protein: 7.5 g/dL (ref 6.4–8.3)

## 2016-08-22 LAB — TSH: TSH: 0.669 m[IU]/L (ref 0.320–4.118)

## 2016-08-22 MED ORDER — SODIUM CHLORIDE 0.9 % IV SOLN
Freq: Once | INTRAVENOUS | Status: AC
  Administered 2016-08-22: 16:00:00 via INTRAVENOUS

## 2016-08-22 MED ORDER — DURVALUMAB 500 MG/10ML IV SOLN
10.0000 mg/kg | Freq: Once | INTRAVENOUS | Status: AC
  Administered 2016-08-22: 620 mg via INTRAVENOUS
  Filled 2016-08-22: qty 10

## 2016-08-22 NOTE — Patient Instructions (Signed)
Durvalumab injection  What is this medicine?  DURVALUMAB (dur VAL ue mab) is a monoclonal antibody. It is used to treat urothelial cancer.  This medicine may be used for other purposes; ask your health care provider or pharmacist if you have questions.  COMMON BRAND NAME(S): IMFINZI  What should I tell my health care provider before I take this medicine?  They need to know if you have any of these conditions:  -diabetes  -immune system problems  -infection  -inflammatory bowel disease  -kidney disease  -liver disease  -lung or breathing disease  -lupus  -organ transplant  -stomach or intestine problems  -thyroid disease  -an unusual or allergic reaction to durvalumab, other medicines, foods, dyes, or preservatives  -pregnant or trying to get pregnant  -breast-feeding  How should I use this medicine?  This medicine is for infusion into a vein. It is given by a health care professional in a hospital or clinic setting.  A special MedGuide will be given to you before each treatment. Be sure to read this information carefully each time.  Talk to your pediatrician regarding the use of this medicine in children. Special care may be needed.  Overdosage: If you think you have taken too much of this medicine contact a poison control center or emergency room at once.  NOTE: This medicine is only for you. Do not share this medicine with others.  What if I miss a dose?  It is important not to miss your dose. Call your doctor or health care professional if you are unable to keep an appointment.  What may interact with this medicine?  Interactions have not been studied.  This list may not describe all possible interactions. Give your health care provider a list of all the medicines, herbs, non-prescription drugs, or dietary supplements you use. Also tell them if you smoke, drink alcohol, or use illegal drugs. Some items may interact with your medicine.  What should I watch for while using this medicine?  This drug may make you  feel generally unwell. Continue your course of treatment even though you feel ill unless your doctor tells you to stop.  You may need blood work done while you are taking this medicine.  Do not become pregnant while taking this medicine or for 3 months after stopping it. Women should inform their doctor if they wish to become pregnant or think they might be pregnant. There is a potential for serious side effects to an unborn child. Talk to your health care professional or pharmacist for more information. Do not breast-feed an infant while taking this medicine or for 3 months after stopping it.  What side effects may I notice from receiving this medicine?  Side effects that you should report to your doctor or health care professional as soon as possible:  -allergic reactions like skin rash, itching or hives, swelling of the face, lips, or tongue  -black, tarry stools  -bloody or watery diarrhea  -breathing problems  -change in emotions or moods  -change in sex drive  -changes in vision  -chest pain or chest tightness  -chills  -confusion  -cough  -facial flushing  -fever  -headache  -signs and symptoms of high blood sugar such as dizziness; dry mouth; dry skin; fruity breath; nausea; stomach pain; increased hunger or thirst; increased urination  -signs and symptoms of liver injury like dark yellow or brown urine; general ill feeling or flu-like symptoms; light-colored stools; loss of appetite; nausea; right upper belly pain;   unusually weak or tired; yellowing of the eyes or skin  -stomach pain  -trouble passing urine or change in the amount of urine  -weight gain or weight loss  Side effects that usually do not require medical attention (report these to your doctor or health care professional if they continue or are bothersome):  -bone pain  -constipation  -loss of appetite  -muscle pain  -nausea  -swelling of the ankles, feet, hands  -tiredness  This list may not describe all possible side effects. Call your doctor  for medical advice about side effects. You may report side effects to FDA at 1-800-FDA-1088.  Where should I keep my medicine?  This drug is given in a hospital or clinic and will not be stored at home.  NOTE: This sheet is a summary. It may not cover all possible information. If you have questions about this medicine, talk to your doctor, pharmacist, or health care provider.  © 2018 [employer] (2015-09-30 15:50:36)

## 2016-08-27 ENCOUNTER — Telehealth: Payer: Self-pay | Admitting: Medical Oncology

## 2016-08-27 NOTE — Telephone Encounter (Signed)
Pt doing well after his treatment last week.

## 2016-09-05 ENCOUNTER — Ambulatory Visit (HOSPITAL_BASED_OUTPATIENT_CLINIC_OR_DEPARTMENT_OTHER): Payer: Medicare Other | Admitting: Internal Medicine

## 2016-09-05 ENCOUNTER — Encounter: Payer: Self-pay | Admitting: Internal Medicine

## 2016-09-05 ENCOUNTER — Telehealth: Payer: Self-pay | Admitting: Internal Medicine

## 2016-09-05 ENCOUNTER — Telehealth: Payer: Self-pay | Admitting: *Deleted

## 2016-09-05 ENCOUNTER — Other Ambulatory Visit (HOSPITAL_BASED_OUTPATIENT_CLINIC_OR_DEPARTMENT_OTHER): Payer: Medicare Other

## 2016-09-05 ENCOUNTER — Ambulatory Visit: Payer: Medicare Other

## 2016-09-05 VITALS — BP 135/86 | HR 65 | Temp 97.6°F | Resp 17 | Ht 75.0 in | Wt 136.2 lb

## 2016-09-05 DIAGNOSIS — R21 Rash and other nonspecific skin eruption: Secondary | ICD-10-CM

## 2016-09-05 DIAGNOSIS — Z5112 Encounter for antineoplastic immunotherapy: Secondary | ICD-10-CM | POA: Insufficient documentation

## 2016-09-05 DIAGNOSIS — R5382 Chronic fatigue, unspecified: Secondary | ICD-10-CM

## 2016-09-05 DIAGNOSIS — C3492 Malignant neoplasm of unspecified part of left bronchus or lung: Secondary | ICD-10-CM

## 2016-09-05 DIAGNOSIS — C3412 Malignant neoplasm of upper lobe, left bronchus or lung: Secondary | ICD-10-CM

## 2016-09-05 DIAGNOSIS — Z79899 Other long term (current) drug therapy: Secondary | ICD-10-CM | POA: Diagnosis not present

## 2016-09-05 DIAGNOSIS — L270 Generalized skin eruption due to drugs and medicaments taken internally: Secondary | ICD-10-CM

## 2016-09-05 HISTORY — DX: Generalized skin eruption due to drugs and medicaments taken internally: L27.0

## 2016-09-05 LAB — CBC WITH DIFFERENTIAL/PLATELET
BASO%: 0.8 % (ref 0.0–2.0)
Basophils Absolute: 0 10*3/uL (ref 0.0–0.1)
EOS%: 7.8 % — ABNORMAL HIGH (ref 0.0–7.0)
Eosinophils Absolute: 0.4 10*3/uL (ref 0.0–0.5)
HEMATOCRIT: 39.5 % (ref 38.4–49.9)
HEMOGLOBIN: 12.8 g/dL — AB (ref 13.0–17.1)
LYMPH#: 0.7 10*3/uL — AB (ref 0.9–3.3)
LYMPH%: 12.1 % — ABNORMAL LOW (ref 14.0–49.0)
MCH: 29.9 pg (ref 27.2–33.4)
MCHC: 32.5 g/dL (ref 32.0–36.0)
MCV: 91.9 fL (ref 79.3–98.0)
MONO#: 0.6 10*3/uL (ref 0.1–0.9)
MONO%: 11.3 % (ref 0.0–14.0)
NEUT#: 3.9 10*3/uL (ref 1.5–6.5)
NEUT%: 68 % (ref 39.0–75.0)
PLATELETS: 222 10*3/uL (ref 140–400)
RBC: 4.3 10*6/uL (ref 4.20–5.82)
RDW: 17.7 % — AB (ref 11.0–14.6)
WBC: 5.7 10*3/uL (ref 4.0–10.3)

## 2016-09-05 LAB — COMPREHENSIVE METABOLIC PANEL
ALBUMIN: 3.3 g/dL — AB (ref 3.5–5.0)
ALK PHOS: 104 U/L (ref 40–150)
ALT: 12 U/L (ref 0–55)
ANION GAP: 14 meq/L — AB (ref 3–11)
AST: 19 U/L (ref 5–34)
BILIRUBIN TOTAL: 0.32 mg/dL (ref 0.20–1.20)
BUN: 15.6 mg/dL (ref 7.0–26.0)
CALCIUM: 9.9 mg/dL (ref 8.4–10.4)
CO2: 23 mEq/L (ref 22–29)
CREATININE: 1.2 mg/dL (ref 0.7–1.3)
Chloride: 105 mEq/L (ref 98–109)
EGFR: 75 mL/min/{1.73_m2} — ABNORMAL LOW (ref >90)
Glucose: 87 mg/dl (ref 70–140)
Potassium: 4 mEq/L (ref 3.5–5.1)
Sodium: 142 mEq/L (ref 136–145)
TOTAL PROTEIN: 7.9 g/dL (ref 6.4–8.3)

## 2016-09-05 LAB — TSH: TSH: 0.87 m[IU]/L (ref 0.320–4.118)

## 2016-09-05 MED ORDER — METHYLPREDNISOLONE 4 MG PO TBPK: ORAL_TABLET | ORAL | 0 refills | Status: DC

## 2016-09-05 NOTE — Telephone Encounter (Signed)
Gave patient avs report and appointments for July and August. Per 6/27 los cx treatment today and patient to f/u for lab/fu/tx 2 weeks. Added additional q2w appointments 7/11 thru end of August.

## 2016-09-05 NOTE — Patient Instructions (Signed)
Steps to Quit Smoking Smoking tobacco can be bad for your health. It can also affect almost every organ in your body. Smoking puts you and people around you at risk for many serious long-lasting (chronic) diseases. Quitting smoking is hard, but it is one of the best things that you can do for your health. It is never too late to quit. What are the benefits of quitting smoking? When you quit smoking, you lower your risk for getting serious diseases and conditions. They can include:  Lung cancer or lung disease.  Heart disease.  Stroke.  Heart attack.  Not being able to have children (infertility).  Weak bones (osteoporosis) and broken bones (fractures).  If you have coughing, wheezing, and shortness of breath, those symptoms may get better when you quit. You may also get sick less often. If you are pregnant, quitting smoking can help to lower your chances of having a baby of low birth weight. What can I do to help me quit smoking? Talk with your doctor about what can help you quit smoking. Some things you can do (strategies) include:  Quitting smoking totally, instead of slowly cutting back how much you smoke over a period of time.  Going to in-person counseling. You are more likely to quit if you go to many counseling sessions.  Using resources and support systems, such as: ? Online chats with a counselor. ? Phone quitlines. ? Printed self-help materials. ? Support groups or group counseling. ? Text messaging programs. ? Mobile phone apps or applications.  Taking medicines. Some of these medicines may have nicotine in them. If you are pregnant or breastfeeding, do not take any medicines to quit smoking unless your doctor says it is okay. Talk with your doctor about counseling or other things that can help you.  Talk with your doctor about using more than one strategy at the same time, such as taking medicines while you are also going to in-person counseling. This can help make  quitting easier. What things can I do to make it easier to quit? Quitting smoking might feel very hard at first, but there is a lot that you can do to make it easier. Take these steps:  Talk to your family and friends. Ask them to support and encourage you.  Call phone quitlines, reach out to support groups, or work with a counselor.  Ask people who smoke to not smoke around you.  Avoid places that make you want (trigger) to smoke, such as: ? Bars. ? Parties. ? Smoke-break areas at work.  Spend time with people who do not smoke.  Lower the stress in your life. Stress can make you want to smoke. Try these things to help your stress: ? Getting regular exercise. ? Deep-breathing exercises. ? Yoga. ? Meditating. ? Doing a body scan. To do this, close your eyes, focus on one area of your body at a time from head to toe, and notice which parts of your body are tense. Try to relax the muscles in those areas.  Download or buy apps on your mobile phone or tablet that can help you stick to your quit plan. There are many free apps, such as QuitGuide from the CDC (Centers for Disease Control and Prevention). You can find more support from smokefree.gov and other websites.  This information is not intended to replace advice given to you by your health care provider. Make sure you discuss any questions you have with your health care provider. Document Released: 12/23/2008 Document   Revised: 10/25/2015 Document Reviewed: 07/13/2014 Elsevier Interactive Patient Education  2018 Elsevier Inc.  

## 2016-09-05 NOTE — Telephone Encounter (Signed)
Oncology Nurse Navigator Documentation  Oncology Nurse Navigator Flowsheets 09/05/2016  Navigator Location [facility]  Navigator Encounter Type Clinic/MDC/I spoke with patient today at clinic.  He has a new rash that has developed.  Dr. Julien Nordmann saw and states could be a side effect of treatment.  He will not received treatment today and will be starting on steroids.  I help explained the reason for no treatment today.   Mr. Haugan transportation will not be here until 26.  I called his sister and updated her on no treatment today and that he will be starting on steroids.  She states she will come and get him in around 45 minutes.  She also will get his steroids.  I updated Mr. Yadav.    Patient Visit Type MedOnc  Treatment Phase Treatment  Barriers/Navigation Needs Coordination of Care  Interventions Coordination of Care  Coordination of Care Other  Acuity Level 2  Acuity Level 2 Other  Time Spent with Patient 30

## 2016-09-05 NOTE — Progress Notes (Signed)
[place] [phone]   [phone]  OFFICE PROGRESS NOTE  [name], MD [place] [address]  DIAGNOSIS: Stage IIIa (T2b, N2, M0) non-small cell lung cancer, squamous cell carcinoma presented with left upper lobe lung mass in addition to AP window lymphadenopathy diagnosed in February 2018.  PRIOR THERAPY: Concurrent chemoradiation therapy. He received radiation for approximately 2-3 weeks prior to starting his chemotherapy. He completed his radiation on 07/05/2016. He received 2 doses of weekly carboplatin for an AUC of 2 and paclitaxel 45 mg meter squared which was last given on 07/03/2016.  CURRENT THERAPY:  consolidation treatment with immunotherapy with Imfinzi (Durvalumab) 10 MG/KG every 2 weeks. First dose 08/22/2016. Status post one cycle.  INTERVAL HISTORY: [name] 66 y.o. male returns to the clinic today for follow-up visit. The patient is currently undergoing treatment with immunotherapy with Imfinzi (Durvalumab) status post 1 cycle. He tolerated the first cycle well except for skin rash in the upper and lower extremities as well as back and chest. The rash developed after his treatment. He denied having any diarrhea. He denied having any nausea, vomiting or constipation. He denied having any fever or chills. He has no chest pain, shortness breath, cough or hemoptysis. He denied having any recent weight loss or night sweats. He is here today for evaluation before starting cycle #2.   MEDICAL HISTORY: Past Medical History:  Diagnosis Date  . AAA (abdominal aortic aneurysm) ([place])   . Abdominal aortic aneurysm ([place])    AAA And bilateral common iliac artery aneurysms  . BPH (benign prostatic hyperplasia)   . CAD (coronary artery disease)    Dr Ron Parker @ [facility]   . Chronic mental illness 04/21/2011   Diagnosis unclear,  Family provides good care  . COPD (chronic obstructive pulmonary disease) ([place]) 04/21/2011     smoking  . Ejection fraction    EF 35-40%, echo, May, 2010  . Hyperlipidemia   . Hyperplastic colon polyp 04/21/2011  . Hypertension   . Preop cardiovascular exam    Cardiac clearance for major vascular surgery, May, 2013  . Pulmonary nodule    Chest CT May, 2013, 2 small pulmonary nodules, needs appropriate followup,  this CT was not ordered by the cardiology team  . PVD (peripheral vascular disease) ([place]) 04/21/2011  . Retention of urine   . Shortness of breath   . Squamous cell lung cancer, left ([place]) 06/13/2016  . Tobacco abuse     ALLERGIES:  is allergic to ace inhibitors.  MEDICATIONS:  Current Outpatient Prescriptions  Medication Sig Dispense Refill  . aspirin EC 81 MG tablet Take 81 mg by mouth daily.    . rosuvastatin (CRESTOR) 40 MG tablet TAKE 1 TABLET BY MOUTH EVERY DAY 90 tablet 0  . prochlorperazine (COMPAZINE) 10 MG tablet Take 1 tablet (10 mg total) by mouth every 6 (six) hours as needed for nausea or vomiting. (Patient not taking: Reported on 08/13/2016) 30 tablet 0  . sucralfate (CARAFATE) 1 GM/10ML suspension Take 10 mLs (1 g total) by mouth 4 (four) times daily -  with meals and at bedtime. (Patient not taking: Reported on 08/13/2016) 420 mL 0   No current facility-administered medications for this visit.     SURGICAL HISTORY:  Past Surgical History:  Procedure Laterality Date  . ABDOMINAL AORTA STENT  07/2011  . ABDOMINAL AORTAGRAM N/A 07/26/2011   Procedure: ABDOMINAL Maxcine Ham;  Surgeon: Conrad [address] [place],  MD;  Location: [facility] CATH LAB;  Service: Cardiovascular;  Laterality: N/A;  . ABDOMINAL AORTIC ANEURYSM REPAIR  07/2011  . CORONARY ARTERY BYPASS GRAFT  07/2008   CABG X5/notes 07/12/2010  . EMBOLIZATION Left 07/26/2011   Procedure: EMBOLIZATION;  Surgeon: Conrad Akhiok, MD;  Location: [facility] CATH LAB;  Service: Cardiovascular;  Laterality: Left;  . TRANSURETHRAL RESECTION OF PROSTATE  11/29/2011   Procedure: TRANSURETHRAL RESECTION OF THE PROSTATE WITH GYRUS INSTRUMENTS;   Surgeon: Malka So, MD;  Location: [facility] ORS;  Service: Urology;  Laterality: N/A;  Prostate Ultrasound, and Biopsy  . VIDEO BRONCHOSCOPY Bilateral 04/12/2016   Procedure: VIDEO BRONCHOSCOPY WITH FLUORO;  Surgeon: Juanito Doom, MD;  Location: [facility];  Service: Cardiopulmonary;  Laterality: Bilateral;    REVIEW OF SYSTEMS:  Constitutional: positive for fatigue Eyes: negative Ears, nose, mouth, throat, and face: negative Respiratory: negative Cardiovascular: negative Gastrointestinal: negative Genitourinary:negative Integument/breast: positive for rash and skin lesion(s) Hematologic/lymphatic: negative Musculoskeletal:negative Neurological: negative Behavioral/Psych: negative Endocrine: negative Allergic/Immunologic: negative   PHYSICAL EXAMINATION: General appearance: alert, cooperative, fatigued and no distress Head: Normocephalic, without obvious abnormality, atraumatic Neck: no adenopathy, no JVD, supple, symmetrical, trachea midline and thyroid not enlarged, symmetric, no tenderness/mass/nodules Lymph nodes: Cervical, supraclavicular, and axillary nodes normal. Resp: clear to auscultation bilaterally Back: symmetric, no curvature. ROM normal. No CVA tenderness. Cardio: regular rate and rhythm, S1, S2 normal, no murmur, click, rub or gallop GI: soft, non-tender; bowel sounds normal; no masses,  no organomegaly Extremities: extremities normal, atraumatic, no cyanosis or edema Neurologic: Alert and oriented X 3, normal strength and tone. Normal symmetric reflexes. Normal coordination and gait        ECOG PERFORMANCE STATUS: 1 - Symptomatic but completely ambulatory  Blood pressure 135/86, pulse 65, temperature 97.6 F (36.4 C), temperature source Oral, resp. rate 17, height 6\' 3"  (1.905 m), weight 136 lb 3.2 oz (61.8 kg), SpO2 99 %.  LABORATORY DATA: Lab Results  Component Value Date   WBC 5.7 09/05/2016   HGB 12.8 (L) 09/05/2016   HCT 39.5 09/05/2016   MCV  91.9 09/05/2016   PLT 222 09/05/2016      Chemistry      Component Value Date/Time   NA 142 09/05/2016 0817   K 4.0 09/05/2016 0817   CL 102 05/02/2016 1608   CO2 23 09/05/2016 0817   BUN 15.6 09/05/2016 0817   CREATININE 1.2 09/05/2016 0817      Component Value Date/Time   CALCIUM 9.9 09/05/2016 0817   ALKPHOS 104 09/05/2016 0817   AST 19 09/05/2016 0817   ALT 12 09/05/2016 0817   BILITOT 0.32 09/05/2016 0817       RADIOGRAPHIC STUDIES: Ct Chest W Contrast  Result Date: 08/08/2016 CLINICAL DATA:  Status post chemotherapy and radiation therapy for lung cancer. Non-small-cell diagnosed 2/18. EXAM: CT CHEST WITH CONTRAST TECHNIQUE: Multidetector CT imaging of the chest was performed during intravenous contrast administration. CONTRAST:  52mL ISOVUE-300 IOPAMIDOL (ISOVUE-300) INJECTION 61% COMPARISON:  04/25/2016 PET.  03/28/2016 chest CT. FINDINGS: Cardiovascular: Aortic and branch vessel atherosclerosis. Normal heart size, without pericardial effusion. Median sternotomy for CABG. No central pulmonary embolism, on this non-dedicated study. Mediastinum/Nodes: No supraclavicular adenopathy. Borderline AP window adenopathy at 10 mm on image 67/series 2. Not well evaluated on prior noncontrast exams. No hilar adenopathy. Lungs/Pleura: Bullous type emphysema. Lower lobe predominant bronchial wall thickening. Right lower lobe scarring. Spiculated cavitary left upper lobe lung mass measures 3.1 x 3.2 cm on image 36/series 5. Compare 3.7 x  4.8 cm on the prior. More cephalad left apical heterogeneous consolidation is most likely radiation induced, and is new. 5 mm left lower lobe pulmonary nodule is unchanged on image 100/series 5. Upper Abdomen: Motion degradation in the upper abdomen. Reflux of contrast into the hepatic veins suggests elevated right heart pressures. Normal imaged portions of the spleen, stomach, pancreas, left kidney. Interpolar right renal low-density lesion is likely a cyst.  Adrenal glands not well evaluated. Incompletely imaged abdominal aortic stent graft. Musculoskeletal: No acute osseous abnormality. IMPRESSION: 1. Decreased size of left upper lobe cavitary lung mass. 2. Borderline AP window adenopathy is not well evaluated on prior exam and indeterminate. Recommend attention on follow-up. 3.  Aortic atherosclerosis. 4. Motion degraded evaluation of the upper abdomen. Electronically Signed   By: Abigail Miyamoto M.D.   On: 08/08/2016 11:26    ASSESSMENT AND PLAN:  This is a very pleasant 66 years old African-American male with unresectable a stage IIIa non-small cell lung cancer, status post a course of concurrent chemoradiation with weekly carboplatin and paclitaxel and tolerated the treatment well. His scan showed no evidence for disease progression and the patient was started on consolidation treatment with immunotherapy with Imfinzi (Durvalumab) status post 1 cycle. He tolerated the first cycle well except for development of grade 2 skin rash involving the upper and lower extremities as well as abdomen and back. This rash is likely immunotherapy mediated. I recommended for the patient to hold his treatment with Imfinzi (Durvalumab) for now. I will see him back for follow-up visit in 2 weeks for reevaluation before resuming his treatment. For the rash, I will start the patient on Medrol Dosepak and if no improvement he will be treated with the higher dose of his steroids. He was advised to call immediately if he has any concerning symptoms in the interval. The patient voices understanding of current disease status and treatment options and is in agreement with the current care plan. All questions were answered. The patient knows to call the clinic with any problems, questions or concerns. We can certainly see the patient much sooner if necessary.   Disclaimer: This note was dictated with voice recognition software. Similar sounding words can inadvertently be transcribed  and may not be corrected upon review.

## 2016-09-10 ENCOUNTER — Ambulatory Visit: Payer: Medicare Other | Admitting: Internal Medicine

## 2016-09-19 ENCOUNTER — Ambulatory Visit (HOSPITAL_BASED_OUTPATIENT_CLINIC_OR_DEPARTMENT_OTHER): Payer: Medicare Other | Admitting: Nurse Practitioner

## 2016-09-19 ENCOUNTER — Other Ambulatory Visit (HOSPITAL_BASED_OUTPATIENT_CLINIC_OR_DEPARTMENT_OTHER): Payer: Medicare Other

## 2016-09-19 ENCOUNTER — Ambulatory Visit (HOSPITAL_BASED_OUTPATIENT_CLINIC_OR_DEPARTMENT_OTHER): Payer: Medicare Other

## 2016-09-19 ENCOUNTER — Encounter: Payer: Self-pay | Admitting: Nurse Practitioner

## 2016-09-19 VITALS — BP 138/89 | HR 60 | Temp 98.5°F | Resp 18 | Ht 75.0 in | Wt 135.6 lb

## 2016-09-19 DIAGNOSIS — Z5112 Encounter for antineoplastic immunotherapy: Secondary | ICD-10-CM

## 2016-09-19 DIAGNOSIS — C3412 Malignant neoplasm of upper lobe, left bronchus or lung: Secondary | ICD-10-CM

## 2016-09-19 DIAGNOSIS — C3492 Malignant neoplasm of unspecified part of left bronchus or lung: Secondary | ICD-10-CM

## 2016-09-19 DIAGNOSIS — R21 Rash and other nonspecific skin eruption: Secondary | ICD-10-CM | POA: Diagnosis not present

## 2016-09-19 DIAGNOSIS — L270 Generalized skin eruption due to drugs and medicaments taken internally: Secondary | ICD-10-CM

## 2016-09-19 LAB — CBC WITH DIFFERENTIAL/PLATELET
BASO%: 0.8 % (ref 0.0–2.0)
Basophils Absolute: 0.1 10*3/uL (ref 0.0–0.1)
EOS%: 3.6 % (ref 0.0–7.0)
Eosinophils Absolute: 0.2 10*3/uL (ref 0.0–0.5)
HEMATOCRIT: 37 % — AB (ref 38.4–49.9)
HEMOGLOBIN: 11.9 g/dL — AB (ref 13.0–17.1)
LYMPH#: 0.6 10*3/uL — AB (ref 0.9–3.3)
LYMPH%: 9.3 % — ABNORMAL LOW (ref 14.0–49.0)
MCH: 29.7 pg (ref 27.2–33.4)
MCHC: 32.1 g/dL (ref 32.0–36.0)
MCV: 92.4 fL (ref 79.3–98.0)
MONO#: 0.7 10*3/uL (ref 0.1–0.9)
MONO%: 10.6 % (ref 0.0–14.0)
NEUT#: 4.7 10*3/uL (ref 1.5–6.5)
NEUT%: 75.7 % — AB (ref 39.0–75.0)
Platelets: 293 10*3/uL (ref 140–400)
RBC: 4 10*6/uL — ABNORMAL LOW (ref 4.20–5.82)
RDW: 17.2 % — ABNORMAL HIGH (ref 11.0–14.6)
WBC: 6.2 10*3/uL (ref 4.0–10.3)

## 2016-09-19 LAB — COMPREHENSIVE METABOLIC PANEL
ALBUMIN: 2.9 g/dL — AB (ref 3.5–5.0)
ALK PHOS: 100 U/L (ref 40–150)
ALT: 10 U/L (ref 0–55)
AST: 13 U/L (ref 5–34)
Anion Gap: 11 mEq/L (ref 3–11)
BILIRUBIN TOTAL: 0.27 mg/dL (ref 0.20–1.20)
BUN: 17 mg/dL (ref 7.0–26.0)
CALCIUM: 9.3 mg/dL (ref 8.4–10.4)
CHLORIDE: 104 meq/L (ref 98–109)
CO2: 26 mEq/L (ref 22–29)
CREATININE: 1 mg/dL (ref 0.7–1.3)
EGFR: 85 mL/min/{1.73_m2} — ABNORMAL LOW (ref >90)
Glucose: 82 mg/dl (ref 70–140)
Potassium: 3.8 mEq/L (ref 3.5–5.1)
Sodium: 142 mEq/L (ref 136–145)
Total Protein: 7.3 g/dL (ref 6.4–8.3)

## 2016-09-19 MED ORDER — SODIUM CHLORIDE 0.9 % IV SOLN
10.0000 mg/kg | Freq: Once | INTRAVENOUS | Status: AC
  Administered 2016-09-19: 620 mg via INTRAVENOUS
  Filled 2016-09-19: qty 2.4

## 2016-09-19 MED ORDER — SODIUM CHLORIDE 0.9 % IV SOLN
Freq: Once | INTRAVENOUS | Status: AC
  Administered 2016-09-19: 12:00:00 via INTRAVENOUS

## 2016-09-19 NOTE — Patient Instructions (Signed)
[facility] Discharge Instructions for Patients Receiving Chemotherapy  Today you received the following chemotherapy agents imfinzi   To help prevent nausea and vomiting after your treatment, we encourage you to take your nausea medication as directed  If you develop nausea and vomiting that is not controlled by your nausea medication, call the clinic.   BELOW ARE SYMPTOMS THAT SHOULD BE REPORTED IMMEDIATELY:  *FEVER GREATER THAN 100.5 F  *CHILLS WITH OR WITHOUT FEVER  NAUSEA AND VOMITING THAT IS NOT CONTROLLED WITH YOUR NAUSEA MEDICATION  *UNUSUAL SHORTNESS OF BREATH  *UNUSUAL BRUISING OR BLEEDING  TENDERNESS IN MOUTH AND THROAT WITH OR WITHOUT PRESENCE OF ULCERS  *URINARY PROBLEMS  *BOWEL PROBLEMS  UNUSUAL RASH Items with * indicate a potential emergency and should be followed up as soon as possible.  Feel free to call the clinic you have any questions or concerns. The clinic phone number is [phone].

## 2016-09-19 NOTE — Progress Notes (Addendum)
[name] OFFICE PROGRESS NOTE   DIAGNOSIS:Stage IIIa (T2b, N2, M0) non-small cell lung cancer, squamous cell carcinoma presented with left upper lobe lung mass in addition to AP window lymphadenopathy diagnosed in February 2018.  PRIOR THERAPY:Concurrent chemoradiation therapy. He received radiation for approximately 2-3 weeks prior to starting his chemotherapy. He completed his radiation on 07/05/2016. He received 2 doses of weekly carboplatin for an AUC of 2 and paclitaxel 45 mg meter squared which was last given on 07/03/2016.  CURRENT THERAPY: Consolidation treatment with immunotherapy with Imfinzi (Durvalumab) 10 MG/KG every 2 weeks. First dose 08/22/2016. Status post one cycle.  INTERVAL HISTORY:   [name] returns as scheduled. He completed cycle 1 Durvalumab 08/22/2016. Cycle 2 was held 09/05/2016 due to a skin rash, likely immune mediated. He was started on a Medrol Dosepak. He reports the rash has persisted. He notes less pruritus. He denies nausea. No mouth sores. No diarrhea. He denies shortness of breath.  Objective:  Vital signs in last 24 hours:  Blood pressure 138/89, pulse 60, temperature 98.5 F (36.9 C), temperature source Oral, resp. rate 18, height 6\' 3"  (1.905 m), weight 135 lb 9.6 oz (61.5 kg), SpO2 95 %.    HEENT: No thrush or ulcers. Resp: Distant breath sounds. Cardio: Regular rate and rhythm. GI: No hepatomegaly. Vascular: No leg edema. Extensive bilateral lower extremity varicosities.  Skin: Multiple discrete erythematous lesions scattered over the trunk and extremities.    Lab Results:  Lab Results  Component Value Date   WBC 6.2 09/19/2016   HGB 11.9 (L) 09/19/2016   HCT 37.0 (L) 09/19/2016   MCV 92.4 09/19/2016   PLT 293 09/19/2016   NEUTROABS 4.7 09/19/2016    Imaging:  No results found.  Medications: I have reviewed the patient's current medications.  Assessment/Plan: 1. Unresectable stage IIIa non-small cell  lung cancer, status post a course of concurrent chemoradiation with weekly carboplatin and paclitaxel; follow-up scan showed no evidence for disease progression and the patient was started on consolidation treatment with immunotherapy with Imfinzi (Durvalumab) 08/22/2016. Cycle 2 held 09/05/2016 due to a skin rash felt to likely be immunotherapy mediated. 2. Skin rash, likely immunotherapy mediated. Status post Medrol Dosepak 09/05/2016.   Disposition: [name] appears stable. He completed 1 cycle of Durvalumab 08/22/2016. Cycle 2 was held 09/05/2016 due to a rash felt to likely be immunotherapy mediated. He completed a Medrol Dosepak.  Dr. Julien Nordmann recommends proceeding with cycle 2 Durvalumab today as scheduled. [name] understands to contact the office if the rash worsens following treatment.  He will return as scheduled in 2 weeks.  Patient seen with Dr. Julien Nordmann.    Ned Card ANP/GNP-BC   09/19/2016  11:08 AM   ADDENDUM: Hematology/Oncology Attending: I had a face to face encounter with the patient. I recommended his care plan. This is a very pleasant 66 years old African-American male with unresectable a stage IIIa non-small cell lung cancer status post concurrent chemoradiation and he is currently undergoing consolidation treatment with immunotherapy with Imfinzi (Durvalumab). He is status post 1 cycle and the second cycle was held 2 weeks ago secondary to a skin rash and the patient was treated with Medrol Dosepak. He has improvement on the skin rash but still has residual areas on the abdomen and upper extremities. I recommended for the patient to proceed with cycle #2 today as scheduled. We'll continue to monitor him closely and he was strongly advised to call immediately if he develops any further skin  rash. He would come back for follow-up visit in 2 weeks for reevaluation. Disclaimer: This note was dictated with voice recognition software. Similar sounding words can  inadvertently be transcribed and may be missed upon review. Eilleen Kempf., MD 09/19/16

## 2016-09-24 ENCOUNTER — Telehealth: Payer: Self-pay | Admitting: Medical Oncology

## 2016-09-24 NOTE — Telephone Encounter (Signed)
[name] reports pt has a rash all over.  Pt reports "it is more blistery". He completed prednisone dose pak.

## 2016-09-24 NOTE — Telephone Encounter (Signed)
He needs to see symptom management or first available APP soon.

## 2016-09-25 ENCOUNTER — Telehealth: Payer: Self-pay | Admitting: Internal Medicine

## 2016-09-25 NOTE — Telephone Encounter (Signed)
Scheduled appt per 7/17 sch message - unable to come in today - scheduled for 7/18 - patient sister is aware of appt date and time.

## 2016-09-26 ENCOUNTER — Encounter: Payer: Self-pay | Admitting: Oncology

## 2016-09-26 ENCOUNTER — Ambulatory Visit (HOSPITAL_BASED_OUTPATIENT_CLINIC_OR_DEPARTMENT_OTHER): Payer: Medicare Other | Admitting: Oncology

## 2016-09-26 ENCOUNTER — Telehealth: Payer: Self-pay | Admitting: Internal Medicine

## 2016-09-26 VITALS — BP 110/80 | HR 83 | Temp 97.8°F | Resp 18 | Ht 75.0 in | Wt 131.3 lb

## 2016-09-26 DIAGNOSIS — R21 Rash and other nonspecific skin eruption: Secondary | ICD-10-CM

## 2016-09-26 DIAGNOSIS — C3412 Malignant neoplasm of upper lobe, left bronchus or lung: Secondary | ICD-10-CM

## 2016-09-26 DIAGNOSIS — L270 Generalized skin eruption due to drugs and medicaments taken internally: Secondary | ICD-10-CM

## 2016-09-26 DIAGNOSIS — C3492 Malignant neoplasm of unspecified part of left bronchus or lung: Secondary | ICD-10-CM

## 2016-09-26 MED ORDER — PREDNISONE 20 MG PO TABS: ORAL_TABLET | ORAL | 0 refills | Status: DC

## 2016-09-26 NOTE — Telephone Encounter (Signed)
Cancelled appts per 7/18 los - Return lab and RV with [facility] in 1 month. Cancel all appts on 7/25 & 8/8.[name] Gave patient AVS and calender per los.

## 2016-09-26 NOTE — Progress Notes (Signed)
[place] Telephone:([phone]) [phone]   Fax:([phone]) [phone]  OFFICE PROGRESS NOTE  [name], MD [place] [address]  DIAGNOSIS: Stage IIIa (T2b, N2, M0) non-small cell lung cancer, squamous cell carcinoma presented with left upper lobe lung mass in addition to AP window lymphadenopathy diagnosed in February 2018.  PRIOR THERAPY: Concurrent chemoradiation therapy. He received radiation for approximately 2-3 weeks prior to starting his chemotherapy. He completed his radiation on 07/05/2016. He received 2 doses of weekly carboplatin for an AUC of 2 and paclitaxel 45 mg meter squared which was last given on 07/03/2016.  CURRENT THERAPY:  consolidation treatment with immunotherapy with Imfinzi (Durvalumab) 10 MG/KG every 2 weeks. First dose 08/22/2016. Status post two cycles.  INTERVAL HISTORY: [name] 66 y.o. male returns to the clinic today for follow-up visit with his family member. The patient is currently undergoing treatment with immunotherapy with Imfinzi (Durvalumab) status post 2 cycles. He developed a skin rash in the upper and lower extremities as well as back and chest following his first cycle of immunotherapy and was treated with a Medrol dose pack with improvement. He then received cycle 2 on 09/19/2016 and developed recurrence of his rash following the second cycle. He reports some itching. He denied having any diarrhea. He denied having any nausea, vomiting or constipation. He denied having any fever or chills. He has no chest pain, shortness breath, cough or hemoptysis. He denied having any recent weight loss or night sweats. He is here today for evaluation of his rash.   MEDICAL HISTORY: Past Medical History:  Diagnosis Date  . AAA (abdominal aortic aneurysm) ([place])   . Abdominal aortic aneurysm ([place])    AAA And bilateral common iliac artery aneurysms  . BPH (benign prostatic hyperplasia)   . CAD (coronary artery disease)      Dr Ron Parker @ [facility]   . Chronic mental illness 04/21/2011   Diagnosis unclear,  Family provides good care  . COPD (chronic obstructive pulmonary disease) ([place]) 04/21/2011   smoking  . Drug-induced skin rash 09/05/2016  . Ejection fraction    EF 35-40%, echo, May, 2010  . Hyperlipidemia   . Hyperplastic colon polyp 04/21/2011  . Hypertension   . Preop cardiovascular exam    Cardiac clearance for major vascular surgery, May, 2013  . Pulmonary nodule    Chest CT May, 2013, 2 small pulmonary nodules, needs appropriate followup,  this CT was not ordered by the cardiology team  . PVD (peripheral vascular disease) ([place]) 04/21/2011  . Retention of urine   . Shortness of breath   . Squamous cell lung cancer, left ([place]) 06/13/2016  . Tobacco abuse     ALLERGIES:  is allergic to ace inhibitors.  MEDICATIONS:  Current Outpatient Prescriptions  Medication Sig Dispense Refill  . aspirin EC 81 MG tablet Take 81 mg by mouth daily.    . methylPREDNISolone (MEDROL DOSEPAK) 4 MG TBPK tablet Use as instructed 21 tablet 0  . predniSONE (DELTASONE) 20 MG tablet Take 3 tabs daily X 2 weeks, then 2 tabs daily X 1 week, then 1 tab daily X 1 week, then 1/2 tab daily X 1 week 67 tablet 0  . prochlorperazine (COMPAZINE) 10 MG tablet Take 1 tablet (10 mg total) by mouth every 6 (six) hours as needed for nausea or vomiting. (Patient not taking: Reported on 08/13/2016) 30 tablet 0  . rosuvastatin (CRESTOR) 40 MG tablet TAKE 1 TABLET  BY MOUTH EVERY DAY 90 tablet 0  . sucralfate (CARAFATE) 1 GM/10ML suspension Take 10 mLs (1 g total) by mouth 4 (four) times daily -  with meals and at bedtime. (Patient not taking: Reported on 08/13/2016) 420 mL 0   No current facility-administered medications for this visit.     SURGICAL HISTORY:  Past Surgical History:  Procedure Laterality Date  . ABDOMINAL AORTA STENT  07/2011  . ABDOMINAL AORTAGRAM N/A 07/26/2011   Procedure: ABDOMINAL Maxcine Ham;  Surgeon: Conrad [place], MD;  Location:  [facility] CATH LAB;  Service: Cardiovascular;  Laterality: N/A;  . ABDOMINAL AORTIC ANEURYSM REPAIR  07/2011  . CORONARY ARTERY BYPASS GRAFT  07/2008   CABG X5/notes 07/12/2010  . EMBOLIZATION Left 07/26/2011   Procedure: EMBOLIZATION;  Surgeon: Conrad [place], MD;  Location: [facility] CATH LAB;  Service: Cardiovascular;  Laterality: Left;  . TRANSURETHRAL RESECTION OF PROSTATE  11/29/2011   Procedure: TRANSURETHRAL RESECTION OF THE PROSTATE WITH GYRUS INSTRUMENTS;  Surgeon: Malka So, MD;  Location: [facility] ORS;  Service: Urology;  Laterality: N/A;  Prostate Ultrasound, and Biopsy  . VIDEO BRONCHOSCOPY Bilateral 04/12/2016   Procedure: VIDEO BRONCHOSCOPY WITH FLUORO;  Surgeon: Juanito Doom, MD;  Location: [facility];  Service: Cardiopulmonary;  Laterality: Bilateral;    REVIEW OF SYSTEMS:  Constitutional: positive for fatigue Eyes: negative Ears, nose, mouth, throat, and face: negative Respiratory: negative Cardiovascular: negative Gastrointestinal: negative Genitourinary:negative Integument/breast: positive for rash and skin lesion(s) Hematologic/lymphatic: negative Musculoskeletal:negative Neurological: negative Behavioral/Psych: negative Endocrine: negative Allergic/Immunologic: negative   PHYSICAL EXAMINATION: General appearance: alert, cooperative, fatigued and no distress Head: Normocephalic, without obvious abnormality, atraumatic Neck: no adenopathy, no JVD, supple, symmetrical, trachea midline and thyroid not enlarged, symmetric, no tenderness/mass/nodules Lymph nodes: Cervical, supraclavicular, and axillary nodes normal. Resp: clear to auscultation bilaterally Back: symmetric, no curvature. ROM normal. No CVA tenderness. Cardio: regular rate and rhythm, S1, S2 normal, no murmur, click, rub or gallop GI: soft, non-tender; bowel sounds normal; no masses,  no organomegaly Extremities: extremities normal, atraumatic, no cyanosis or edema Neurologic: Alert and oriented X 3, normal strength  and tone. Normal symmetric reflexes. Normal coordination and gait  Skin: Plaque-like lesions to right and left flank, arms, and legs.   ECOG PERFORMANCE STATUS: 1 - Symptomatic but completely ambulatory  Blood pressure 110/80, pulse 83, temperature 97.8 F (36.6 C), temperature source Oral, resp. rate 18, height 6\' 3"  (1.905 m), weight 131 lb 4.8 oz (59.6 kg), SpO2 100 %.  LABORATORY DATA: Lab Results  Component Value Date   WBC 6.2 09/19/2016   HGB 11.9 (L) 09/19/2016   HCT 37.0 (L) 09/19/2016   MCV 92.4 09/19/2016   PLT 293 09/19/2016      Chemistry      Component Value Date/Time   NA 142 09/19/2016 0932   K 3.8 09/19/2016 0932   CL 102 05/02/2016 1608   CO2 26 09/19/2016 0932   BUN 17.0 09/19/2016 0932   CREATININE 1.0 09/19/2016 0932      Component Value Date/Time   CALCIUM 9.3 09/19/2016 0932   ALKPHOS 100 09/19/2016 0932   AST 13 09/19/2016 0932   ALT 10 09/19/2016 0932   BILITOT 0.27 09/19/2016 0932       RADIOGRAPHIC STUDIES: No results found.  ASSESSMENT AND PLAN:  This is a very pleasant 66 years old African-American male with unresectable a stage IIIa non-small cell lung cancer, status post a course of concurrent chemoradiation with weekly carboplatin and paclitaxel and tolerated the treatment well.  His scan showed no evidence for disease progression and the patient was started on consolidation treatment with immunotherapy with Imfinzi (Durvalumab) status post 2 cycles. He tolerated the treatment well with the exception on development of rash. This rash is likely immunotherapy mediated.  Patient seen with Dr. Julien Nordmann. Recommended for the patient to hold his treatment with Imfinzi (Durvalumab) for now. For the rash, will proceed with a higher dose of steroids than he received previously. He was given a prescription for Prednisone 60 mg X 2 weeks, then 40 mg X 1 week, then 20 mg X 1 week, and then 10 mg X 1 week. He will be seen back in 1 month for  re-evaluation and consideration of further treatment of his lung cancer.   He was advised to call immediately if he has any concerning symptoms in the interval. The patient voices understanding of current disease status and treatment options and is in agreement with the current care plan. All questions were answered. The patient knows to call the clinic with any problems, questions or concerns. We can certainly see the patient much sooner if necessary.  Mikey Bussing, DNP, AGPCNP-BC, AOCNP  ADDENDUM: Hematology/Oncology Attending: I had a face to face encounter with the patient. I recommended his care plan. This is a very pleasant 66 years old African-American male with unresectable a stage IIIa non-small cell lung cancer who completed a course of concurrent chemoradiation with weekly carboplatin and paclitaxel with no evidence for disease progression. The patient was started on consolidation immunotherapy with Imfinzi (Durvalumab) status post 2 cycles. Unfortunately he developed significant skin rash on the upper extremities as well as chest and back with macular patches and plaques. He was treated in the past with Medrol Dosepak with improvement but his rash relapsed after the second cycle of the treatment. I had a lengthy discussion with the patient and his sister about his current condition and treatment options. I recommended for the patient to discontinue his current treatment with Imfinzi (Durvalumab). I will start him on a tapered high-dose of prednisone. I would see him back for follow-up visit in 4 weeks for reevaluation and readjustment of his prednisone taper. The patient was advised to call immediately if he has any concerning symptoms in the interval.  Disclaimer: This note was dictated with voice recognition software. Similar sounding words can inadvertently be transcribed and may be missed upon review. Eilleen Kempf., MD 09/27/16

## 2016-10-03 ENCOUNTER — Other Ambulatory Visit: Payer: Medicare Other

## 2016-10-03 ENCOUNTER — Ambulatory Visit: Payer: Medicare Other | Admitting: Internal Medicine

## 2016-10-03 ENCOUNTER — Ambulatory Visit: Payer: Medicare Other

## 2016-10-17 ENCOUNTER — Other Ambulatory Visit: Payer: Medicare Other

## 2016-10-17 ENCOUNTER — Ambulatory Visit: Payer: Medicare Other

## 2016-10-17 ENCOUNTER — Ambulatory Visit: Payer: Medicare Other | Admitting: Internal Medicine

## 2016-10-31 ENCOUNTER — Ambulatory Visit: Payer: Medicare Other

## 2016-10-31 ENCOUNTER — Ambulatory Visit (HOSPITAL_BASED_OUTPATIENT_CLINIC_OR_DEPARTMENT_OTHER): Payer: Medicare Other | Admitting: Internal Medicine

## 2016-10-31 ENCOUNTER — Encounter: Payer: Self-pay | Admitting: Internal Medicine

## 2016-10-31 ENCOUNTER — Ambulatory Visit (INDEPENDENT_AMBULATORY_CARE_PROVIDER_SITE_OTHER): Payer: Medicare Other | Admitting: Internal Medicine

## 2016-10-31 ENCOUNTER — Telehealth: Payer: Self-pay | Admitting: Internal Medicine

## 2016-10-31 ENCOUNTER — Other Ambulatory Visit (HOSPITAL_BASED_OUTPATIENT_CLINIC_OR_DEPARTMENT_OTHER): Payer: Medicare Other

## 2016-10-31 VITALS — BP 128/84 | HR 72 | Temp 98.4°F | Ht 75.0 in | Wt 134.0 lb

## 2016-10-31 VITALS — BP 163/97 | HR 66 | Temp 97.8°F | Resp 18 | Ht 75.0 in | Wt 132.6 lb

## 2016-10-31 DIAGNOSIS — C3412 Malignant neoplasm of upper lobe, left bronchus or lung: Secondary | ICD-10-CM | POA: Diagnosis not present

## 2016-10-31 DIAGNOSIS — I10 Essential (primary) hypertension: Secondary | ICD-10-CM | POA: Diagnosis not present

## 2016-10-31 DIAGNOSIS — Z23 Encounter for immunization: Secondary | ICD-10-CM | POA: Diagnosis not present

## 2016-10-31 DIAGNOSIS — L270 Generalized skin eruption due to drugs and medicaments taken internally: Secondary | ICD-10-CM

## 2016-10-31 DIAGNOSIS — J449 Chronic obstructive pulmonary disease, unspecified: Secondary | ICD-10-CM

## 2016-10-31 DIAGNOSIS — R972 Elevated prostate specific antigen [PSA]: Secondary | ICD-10-CM | POA: Diagnosis not present

## 2016-10-31 DIAGNOSIS — Z79899 Other long term (current) drug therapy: Secondary | ICD-10-CM

## 2016-10-31 DIAGNOSIS — C3492 Malignant neoplasm of unspecified part of left bronchus or lung: Secondary | ICD-10-CM

## 2016-10-31 DIAGNOSIS — R5382 Chronic fatigue, unspecified: Secondary | ICD-10-CM

## 2016-10-31 DIAGNOSIS — I255 Ischemic cardiomyopathy: Secondary | ICD-10-CM

## 2016-10-31 DIAGNOSIS — E785 Hyperlipidemia, unspecified: Secondary | ICD-10-CM

## 2016-10-31 LAB — COMPREHENSIVE METABOLIC PANEL
ALT: 11 U/L (ref 0–55)
ANION GAP: 9 meq/L (ref 3–11)
AST: 20 U/L (ref 5–34)
Albumin: 3.4 g/dL — ABNORMAL LOW (ref 3.5–5.0)
Alkaline Phosphatase: 89 U/L (ref 40–150)
BILIRUBIN TOTAL: 0.43 mg/dL (ref 0.20–1.20)
BUN: 15.5 mg/dL (ref 7.0–26.0)
CO2: 24 meq/L (ref 22–29)
CREATININE: 1.3 mg/dL (ref 0.7–1.3)
Calcium: 9.8 mg/dL (ref 8.4–10.4)
Chloride: 108 mEq/L (ref 98–109)
EGFR: 64 mL/min/{1.73_m2} — ABNORMAL LOW (ref >90)
GLUCOSE: 83 mg/dL (ref 70–140)
Potassium: 4.4 mEq/L (ref 3.5–5.1)
Sodium: 141 mEq/L (ref 136–145)
TOTAL PROTEIN: 7.4 g/dL (ref 6.4–8.3)

## 2016-10-31 LAB — CBC WITH DIFFERENTIAL/PLATELET
BASO%: 0.2 % (ref 0.0–2.0)
BASOS ABS: 0 10*3/uL (ref 0.0–0.1)
EOS ABS: 0.1 10*3/uL (ref 0.0–0.5)
EOS%: 2.6 % (ref 0.0–7.0)
HEMATOCRIT: 40.4 % (ref 38.4–49.9)
HEMOGLOBIN: 12.9 g/dL — AB (ref 13.0–17.1)
LYMPH#: 0.7 10*3/uL — AB (ref 0.9–3.3)
LYMPH%: 14.1 % (ref 14.0–49.0)
MCH: 29.5 pg (ref 27.2–33.4)
MCHC: 31.9 g/dL — AB (ref 32.0–36.0)
MCV: 92.2 fL (ref 79.3–98.0)
MONO#: 0.5 10*3/uL (ref 0.1–0.9)
MONO%: 9 % (ref 0.0–14.0)
NEUT#: 3.8 10*3/uL (ref 1.5–6.5)
NEUT%: 74.1 % (ref 39.0–75.0)
Platelets: 146 10*3/uL (ref 140–400)
RBC: 4.38 10*6/uL (ref 4.20–5.82)
RDW: 16.6 % — AB (ref 11.0–14.6)
WBC: 5.1 10*3/uL (ref 4.0–10.3)

## 2016-10-31 LAB — TSH: TSH: 0.714 m(IU)/L (ref 0.320–4.118)

## 2016-10-31 NOTE — Progress Notes (Signed)
Subjective:    Patient ID: [name], male    DOB: [birth year], 66 y.o.   MRN: [id number]  HPI  Here to f/u; overall doing ok,  Pt denies chest pain, increasing sob or doe, wheezing, orthopnea, PND, increased LE swelling, palpitations, dizziness or syncope.  Pt denies new neurological symptoms such as new headache, or facial or extremity weakness or numbness.  Pt denies polydipsia, polyuria, or low sugar episode.  Pt states overall good compliance with meds, mostly trying to follow appropriate diet, with wt overall stable Wt Readings from Last 3 Encounters:  10/31/16 134 lb (60.8 kg)  09/26/16 131 lb 4.8 oz (59.6 kg)  09/19/16 135 lb 9.6 oz (61.5 kg)   BP Readings from Last 3 Encounters:  10/31/16 128/84  09/26/16 110/80  09/19/16 138/89  Pt continues with CMT for sq cell lung cancer per oncology.  Admits to missing his statin and asa "some" days in the last few months, but sister who lives with him now supervise the medications.  Still smoking 1 ppd, not ready to quit Past Medical History:  Diagnosis Date  . AAA (abdominal aortic aneurysm) ([place])   . Abdominal aortic aneurysm ([place])    AAA And bilateral common iliac artery aneurysms  . BPH (benign prostatic hyperplasia)   . CAD (coronary artery disease)    Dr Ron Parker @ [facility]   . Chronic mental illness 04/21/2011   Diagnosis unclear,  Family provides good care  . COPD (chronic obstructive pulmonary disease) ([place]) 04/21/2011   smoking  . Drug-induced skin rash 09/05/2016  . Ejection fraction    EF 35-40%, echo, May, 2010  . Hyperlipidemia   . Hyperplastic colon polyp 04/21/2011  . Hypertension   . Preop cardiovascular exam    Cardiac clearance for major vascular surgery, May, 2013  . Pulmonary nodule    Chest CT May, 2013, 2 small pulmonary nodules, needs appropriate followup,  this CT was not ordered by the cardiology team  . PVD (peripheral vascular disease) ([place]) 04/21/2011  . Retention of urine   . Shortness of breath   .  Squamous cell lung cancer, left ([place]) 06/13/2016  . Tobacco abuse    Past Surgical History:  Procedure Laterality Date  . ABDOMINAL AORTA STENT  07/2011  . ABDOMINAL AORTAGRAM N/A 07/26/2011   Procedure: ABDOMINAL Maxcine Ham;  Surgeon: Conrad [place], MD;  [facility] C[facility] LAB;  Service: Cardiovascular;  Laterality: N/A;  . ABDOMINAL AORTIC ANEURYSM REPAIR  07/2011  . CORONARY ARTERY BYPASS GRAFT  07/2008   CABG X5/notes 07/12/2010  . EMBOLIZATION Left 07/26/2011   Procedure: EMBOLIZATION;  Surgeon: Conrad [place], MD;  Location: [facility] C[facility] LAB;  Service: Cardiovascular;  Laterality: Left;  . TRANSURETHRAL RESECTION OF PROSTATE  11/29/2011   Procedure: TRANSURETHRAL RESECTION OF THE PROSTATE WITH GYRUS INSTRUMENTS;  Surgeon: Malka So, MD;  Location: [facility];  Service: Urology;  Laterality: N/A;  Prostate Ultrasound, and Biopsy  . VIDEO BRONCHOSCOPY Bilateral 04/12/2016   Procedure: VIDEO BRONCHOSCOPY WITH FLUORO;  Surgeon: Juanito Doom, MD;  Location: [place];  Service: Cardiopulmonary;  Laterality: Bilateral;    reports that he has been smoking Cigarettes.  He has a 50.00 pack-year smoking history. He has never used smokeless tobacco. He reports that he does not drink alcohol or use drugs. family history includes Arthritis in his sister; Cancer in his father; Diabetes in his father; Heart attack (age of onset: 61) in his father; Heart disease in his father; Hyperlipidemia in  his sister; Hypertension in his sister. Allergies  Allergen Reactions  . Ace Inhibitors Itching   Current Outpatient Prescriptions on File Prior to Visit  Medication Sig Dispense Refill  . aspirin EC 81 MG tablet Take 81 mg by mouth daily.    . prochlorperazine (COMPAZINE) 10 MG tablet Take 1 tablet (10 mg total) by mouth every 6 (six) hours as needed for nausea or vomiting. 30 tablet 0  . rosuvastatin (CRESTOR) 40 MG tablet TAKE 1 TABLET BY MOUTH EVERY DAY 90 tablet 0  . sucralfate (CARAFATE) 1 GM/10ML suspension Take  10 mLs (1 g total) by mouth 4 (four) times daily -  with meals and at bedtime. 420 mL 0   No current facility-administered medications on file prior to visit.    Review of Systems  Constitutional: Negative for other unusual diaphoresis or sweats HENT: Negative for ear discharge or swelling Eyes: Negative for other worsening visual disturbances Respiratory: Negative for stridor or other swelling  Gastrointestinal: Negative for worsening distension or other blood Genitourinary: Negative for retention or other urinary change Musculoskeletal: Negative for other MSK pain or swelling Skin: Negative for color change or other new lesions Neurological: Negative for worsening tremors and other numbness  Psychiatric/Behavioral: Negative for worsening agitation or other fatigue All other system neg per pt    Objective:   Physical Exam BP 128/84   Pulse 72   Temp 98.4 F (36.9 C)   Ht 6\' 3"  (1.905 m)   Wt 134 lb (60.8 kg)   SpO2 97%   BMI 16.75 kg/m  VS noted,  Constitutional: Pt appears in NAD HENT: Head: NCAT.  Right Ear: External ear normal.  Left Ear: External ear normal.  Eyes: . Pupils are equal, round, and reactive to light. Conjunctivae and EOM are normal Nose: without d/c or deformity Neck: Neck supple. Gross normal ROM Cardiovascular: Normal rate and regular rhythm.   Pulmonary/Chest: Effort normal and breath sounds without rales or wheezing.  Abd:  Soft, NT, ND, + BS, no organomegaly Neurological: Pt is alert. At baseline orientation, motor grossly intact Skin: Skin is warm. No rashes, other new lesions, no LE edema Psychiatric: Pt behavior is normal without agitation  No other exam findings    Assessment & Plan:

## 2016-10-31 NOTE — Assessment & Plan Note (Signed)
Lab Results  Component Value Date   LDLCALC 122 (H) 05/02/2016  encouraged pt compliance with statin and diet,  to f/u any worsening symptoms or concerns

## 2016-10-31 NOTE — Assessment & Plan Note (Signed)
Encouraged med compliance, o/w stable overall by history and exam, recent data reviewed with pt, and pt to continue medical treatment as before,  to f/u any worsening symptoms or concerns BP Readings from Last 3 Encounters:  10/31/16 (!) 163/97  10/31/16 128/84  09/26/16 110/80

## 2016-10-31 NOTE — Telephone Encounter (Signed)
Scheduled appt per 8/22 los - Gave patient AVS and calender per los. Central radiology to contact patient with ct.

## 2016-10-31 NOTE — Assessment & Plan Note (Signed)
Asympt, for psa f/u today

## 2016-10-31 NOTE — Patient Instructions (Addendum)
You had the flu shot today - high dose  Please continue all other medications as before, and refills have been done if requested.  Please have the pharmacy call with any other refills you may need.  Please continue your efforts at being more active, low cholesterol diet, and weight control.  Please keep your appointments with your specialists as you may have planned  No further lab work is needed today  Please return in 6 months, or sooner if needed

## 2016-10-31 NOTE — Assessment & Plan Note (Signed)
stable overall by history and exam, and pt to continue medical treatment as before,  to f/u any worsening symptoms or concerns 

## 2016-10-31 NOTE — Progress Notes (Signed)
[facility] [phone]   [phone]  OFFICE PROGRESS NOTE  [name], MD [place] [address]  DIAGNOSIS: Stage IIIa (T2b, N2, M0) non-small cell lung cancer, squamous cell carcinoma presented with left upper lobe lung mass in addition to AP window lymphadenopathy diagnosed in February 2018.  PRIOR THERAPY: Concurrent chemoradiation therapy. He received radiation for approximately 2-3 weeks prior to starting his chemotherapy. He completed his radiation on 07/05/2016. He received 2 doses of weekly carboplatin for an AUC of 2 and paclitaxel 45 mg meter squared which was last given on 07/03/2016.  CURRENT THERAPY:  consolidation treatment with immunotherapy with Imfinzi (Durvalumab) 10 MG/KG every 2 weeks. First dose 08/22/2016. Status post 2 cycles.  INTERVAL HISTORY: [name] 66 y.o. male returns to the clinic today for follow-up visit. The patient is feeling fine today with no specific complaints. He continues to have prominent of the skin rash mainly in the lower extremities. He was treated with a tapered dose of prednisone over the last few weeks. He denied having any chest pain, shortness breath except with exertion and has mild cough with no hemoptysis. He has no fever or chills. He denied having any nausea, vomiting, diarrhea or constipation. His treatment was Imfinzi [employer] [facility] [employer]) has been on hold for several weeks now. He is here for evaluation and recommendation regarding his condition.   MEDICAL HISTORY: Past Medical History:  Diagnosis Date  . AAA (abdominal aortic aneurysm) ([place])   . Abdominal aortic aneurysm ([place])    AAA And bilateral common iliac artery aneurysms  . BPH (benign prostatic hyperplasia)   . CAD (coronary artery disease)    Dr Ron Parker @ [facility]   . Chronic mental illness 04/21/2011   Diagnosis unclear,  Family provides good care  . COPD (chronic obstructive pulmonary disease) ([place]) 04/21/2011     smoking  . Drug-induced skin rash 09/05/2016  . Ejection fraction    EF 35-40%, echo, May, 2010  . Hyperlipidemia   . Hyperplastic colon polyp 04/21/2011  . Hypertension   . Preop cardiovascular exam    Cardiac clearance for major vascular surgery, May, 2013  . Pulmonary nodule    Chest CT May, 2013, 2 small pulmonary nodules, needs appropriate followup,  this CT was not ordered by the cardiology team  . PVD (peripheral vascular disease) ([place]) 04/21/2011  . Retention of urine   . Shortness of breath   . Squamous cell lung cancer, left ([place]) 06/13/2016  . Tobacco abuse     ALLERGIES:  is allergic to ace inhibitors.  MEDICATIONS:  Current Outpatient Prescriptions  Medication Sig Dispense Refill  . aspirin EC 81 MG tablet Take 81 mg by mouth daily.    . prochlorperazine (COMPAZINE) 10 MG tablet Take 1 tablet (10 mg total) by mouth every 6 (six) hours as needed for nausea or vomiting. 30 tablet 0  . rosuvastatin (CRESTOR) 40 MG tablet TAKE 1 TABLET BY MOUTH EVERY DAY 90 tablet 0  . sucralfate (CARAFATE) 1 GM/10ML suspension Take 10 mLs (1 g total) by mouth 4 (four) times daily -  with meals and at bedtime. 420 mL 0   No current facility-administered medications for this visit.     SURGICAL HISTORY:  Past Surgical History:  Procedure Laterality Date  . ABDOMINAL AORTA STENT  07/2011  . ABDOMINAL AORTAGRAM N/A 07/26/2011   Procedure: ABDOMINAL Maxcine Ham;  Surgeon: Conrad , MD;  Location: [facility] CATH  LAB;  Service: Cardiovascular;  Laterality: N/A;  . ABDOMINAL AORTIC ANEURYSM REPAIR  07/2011  . CORONARY ARTERY BYPASS GRAFT  07/2008   CABG X5/notes 07/12/2010  . EMBOLIZATION Left 07/26/2011   Procedure: EMBOLIZATION;  Surgeon: Conrad Chesterhill, MD;  Location: [facility] CATH LAB;  Service: Cardiovascular;  Laterality: Left;  . TRANSURETHRAL RESECTION OF PROSTATE  11/29/2011   Procedure: TRANSURETHRAL RESECTION OF THE PROSTATE WITH GYRUS INSTRUMENTS;  Surgeon: Malka So, MD;  Location: [facility] ORS;   Service: Urology;  Laterality: N/A;  Prostate Ultrasound, and Biopsy  . VIDEO BRONCHOSCOPY Bilateral 04/12/2016   Procedure: VIDEO BRONCHOSCOPY WITH FLUORO;  Surgeon: Juanito Doom, MD;  Location: [facility];  Service: Cardiopulmonary;  Laterality: Bilateral;    REVIEW OF SYSTEMS:  Constitutional: positive for fatigue Eyes: negative Ears, nose, mouth, throat, and face: negative Respiratory: positive for cough and dyspnea on exertion Cardiovascular: negative Gastrointestinal: negative Genitourinary:negative Integument/breast: positive for rash and skin lesion(s) Hematologic/lymphatic: negative Musculoskeletal:negative Neurological: negative Behavioral/Psych: negative Endocrine: negative Allergic/Immunologic: negative   PHYSICAL EXAMINATION: General appearance: alert, cooperative, fatigued and no distress Head: Normocephalic, without obvious abnormality, atraumatic Neck: no adenopathy, no JVD, supple, symmetrical, trachea midline and thyroid not enlarged, symmetric, no tenderness/mass/nodules Lymph nodes: Cervical, supraclavicular, and axillary nodes normal. Resp: clear to auscultation bilaterally Back: symmetric, no curvature. ROM normal. No CVA tenderness. Cardio: regular rate and rhythm, S1, S2 normal, no murmur, click, rub or gallop GI: soft, non-tender; bowel sounds normal; no masses,  no organomegaly Extremities: extremities normal, atraumatic, no cyanosis or edema Neurologic: Alert and oriented X 3, normal strength and tone. Normal symmetric reflexes. Normal coordination and gait    ECOG PERFORMANCE STATUS: 1 - Symptomatic but completely ambulatory  Blood pressure (!) 163/97, pulse 66, temperature 97.8 F (36.6 C), temperature source Oral, resp. rate 18, height 6\' 3"  (1.905 m), weight 132 lb 9.6 oz (60.1 kg), SpO2 99 %.  LABORATORY DATA: Lab Results  Component Value Date   WBC 5.1 10/31/2016   HGB 12.9 (L) 10/31/2016   HCT 40.4 10/31/2016   MCV 92.2 10/31/2016    PLT 146 10/31/2016      Chemistry      Component Value Date/Time   NA 141 10/31/2016 1240   K 4.4 10/31/2016 1240   CL 102 05/02/2016 1608   CO2 24 10/31/2016 1240   BUN 15.5 10/31/2016 1240   CREATININE 1.3 10/31/2016 1240      Component Value Date/Time   CALCIUM 9.8 10/31/2016 1240   ALKPHOS 89 10/31/2016 1240   AST 20 10/31/2016 1240   ALT 11 10/31/2016 1240   BILITOT 0.43 10/31/2016 1240       RADIOGRAPHIC STUDIES: No results found.  ASSESSMENT AND PLAN:  This is a very pleasant 66 years old African-American male with unresectable a stage IIIa non-small cell lung cancer, status post a course of concurrent chemoradiation with weekly carboplatin and paclitaxel and tolerated the treatment well. His scan showed no evidence for disease progression and the patient was started on consolidation treatment with immunotherapy with Imfinzi (Durvalumab) status post 2 cycles. The patient tolerated the previous 2 cycles of his treatment well except for the significant skin rash on the upper and lower extremities as well as abdomen. His treatment was discontinued and the patient was started on a tapering dose of prednisone. His rash is better but continues to have permanent of the previous rash on the lower extremities. I recommended for the patient to see a dermatologist for evaluation and recommendation regarding  his condition. I also recommended for the patient to discontinue his current treatment with Imfinzi (Durvalumab) at this point. I will see him back for follow-up visit in 2 months for reevaluation with repeat CT scan of the chest for restaging of his disease. For hypertension, I strongly encouraged the patient to see his primary care physician for evaluation and management of his hypertension. It doesn't look like the patient is taking his blood pressure medication as prescribed. He was advised to call immediately if he has any concerning symptoms in the interval. The patient voices  understanding of current disease status and treatment options and is in agreement with the current care plan. All questions were answered. The patient knows to call the clinic with any problems, questions or concerns. We can certainly see the patient much sooner if necessary.   Disclaimer: This note was dictated with voice recognition software. Similar sounding words can inadvertently be transcribed and may not be corrected upon review.

## 2016-11-01 ENCOUNTER — Telehealth: Payer: Self-pay | Admitting: Medical Oncology

## 2016-11-01 NOTE — Telephone Encounter (Signed)
Wynetta Emery with appt for dermatology dr Elvera Lennox oct 17th 2 pm.

## 2016-11-14 ENCOUNTER — Ambulatory Visit: Payer: Medicare Other

## 2016-11-14 ENCOUNTER — Other Ambulatory Visit: Payer: Medicare Other

## 2016-11-14 ENCOUNTER — Ambulatory Visit: Payer: Medicare Other | Admitting: Internal Medicine

## 2016-11-27 ENCOUNTER — Other Ambulatory Visit: Payer: Medicare Other

## 2016-11-27 ENCOUNTER — Ambulatory Visit: Payer: Medicare Other | Admitting: Internal Medicine

## 2016-11-28 ENCOUNTER — Ambulatory Visit: Payer: Medicare Other

## 2016-12-28 ENCOUNTER — Other Ambulatory Visit: Payer: Medicare Other

## 2016-12-31 ENCOUNTER — Ambulatory Visit: Payer: Medicare Other | Admitting: Internal Medicine

## 2017-01-16 ENCOUNTER — Other Ambulatory Visit (HOSPITAL_BASED_OUTPATIENT_CLINIC_OR_DEPARTMENT_OTHER): Payer: Medicare Other

## 2017-01-16 DIAGNOSIS — Z79899 Other long term (current) drug therapy: Secondary | ICD-10-CM

## 2017-01-16 DIAGNOSIS — C3412 Malignant neoplasm of upper lobe, left bronchus or lung: Secondary | ICD-10-CM | POA: Diagnosis present

## 2017-01-16 DIAGNOSIS — L270 Generalized skin eruption due to drugs and medicaments taken internally: Secondary | ICD-10-CM | POA: Diagnosis not present

## 2017-01-16 DIAGNOSIS — C3492 Malignant neoplasm of unspecified part of left bronchus or lung: Secondary | ICD-10-CM

## 2017-01-16 DIAGNOSIS — R5382 Chronic fatigue, unspecified: Secondary | ICD-10-CM

## 2017-01-16 LAB — CBC WITH DIFFERENTIAL/PLATELET
BASO%: 0.4 % (ref 0.0–2.0)
Basophils Absolute: 0 10*3/uL (ref 0.0–0.1)
EOS%: 4.9 % (ref 0.0–7.0)
Eosinophils Absolute: 0.3 10*3/uL (ref 0.0–0.5)
HEMATOCRIT: 41 % (ref 38.4–49.9)
HEMOGLOBIN: 13.1 g/dL (ref 13.0–17.1)
LYMPH#: 1 10*3/uL (ref 0.9–3.3)
LYMPH%: 18 % (ref 14.0–49.0)
MCH: 29.2 pg (ref 27.2–33.4)
MCHC: 32 g/dL (ref 32.0–36.0)
MCV: 91.5 fL (ref 79.3–98.0)
MONO#: 0.5 10*3/uL (ref 0.1–0.9)
MONO%: 9.6 % (ref 0.0–14.0)
NEUT%: 67.1 % (ref 39.0–75.0)
NEUTROS ABS: 3.6 10*3/uL (ref 1.5–6.5)
PLATELETS: 202 10*3/uL (ref 140–400)
RBC: 4.48 10*6/uL (ref 4.20–5.82)
RDW: 15.3 % — AB (ref 11.0–14.6)
WBC: 5.3 10*3/uL (ref 4.0–10.3)

## 2017-01-16 LAB — COMPREHENSIVE METABOLIC PANEL
ALBUMIN: 3.6 g/dL (ref 3.5–5.0)
ALK PHOS: 82 U/L (ref 40–150)
ALT: 13 U/L (ref 0–55)
ANION GAP: 10 meq/L (ref 3–11)
AST: 20 U/L (ref 5–34)
BILIRUBIN TOTAL: 0.25 mg/dL (ref 0.20–1.20)
BUN: 30.6 mg/dL — ABNORMAL HIGH (ref 7.0–26.0)
CO2: 24 mEq/L (ref 22–29)
Calcium: 9.6 mg/dL (ref 8.4–10.4)
Chloride: 108 mEq/L (ref 98–109)
Creatinine: 1.2 mg/dL (ref 0.7–1.3)
GLUCOSE: 77 mg/dL (ref 70–140)
POTASSIUM: 4 meq/L (ref 3.5–5.1)
SODIUM: 142 meq/L (ref 136–145)
TOTAL PROTEIN: 7.8 g/dL (ref 6.4–8.3)

## 2017-01-16 LAB — TSH: TSH: 0.96 m(IU)/L (ref 0.320–4.118)

## 2017-01-21 ENCOUNTER — Encounter (HOSPITAL_COMMUNITY): Payer: Self-pay

## 2017-01-21 ENCOUNTER — Ambulatory Visit (HOSPITAL_COMMUNITY)
Admission: RE | Admit: 2017-01-21 | Discharge: 2017-01-21 | Disposition: A | Discharge status: Home / Self Care | Payer: Medicare Other | Source: Ambulatory Visit | Attending: Internal Medicine | Admitting: Internal Medicine

## 2017-01-21 DIAGNOSIS — J439 Emphysema, unspecified: Secondary | ICD-10-CM | POA: Insufficient documentation

## 2017-01-21 DIAGNOSIS — R918 Other nonspecific abnormal finding of lung field: Secondary | ICD-10-CM | POA: Insufficient documentation

## 2017-01-21 DIAGNOSIS — I10 Essential (primary) hypertension: Secondary | ICD-10-CM | POA: Insufficient documentation

## 2017-01-21 DIAGNOSIS — L270 Generalized skin eruption due to drugs and medicaments taken internally: Secondary | ICD-10-CM | POA: Diagnosis not present

## 2017-01-21 DIAGNOSIS — I700 Atherosclerosis of aorta: Secondary | ICD-10-CM | POA: Insufficient documentation

## 2017-01-21 DIAGNOSIS — C3492 Malignant neoplasm of unspecified part of left bronchus or lung: Secondary | ICD-10-CM | POA: Diagnosis not present

## 2017-01-21 DIAGNOSIS — C3412 Malignant neoplasm of upper lobe, left bronchus or lung: Secondary | ICD-10-CM | POA: Diagnosis not present

## 2017-01-21 MED ORDER — IOPAMIDOL (ISOVUE-300) INJECTION 61%
INTRAVENOUS | Status: AC
  Filled 2017-01-21: qty 75

## 2017-01-21 MED ORDER — IOPAMIDOL (ISOVUE-300) INJECTION 61%
75.0000 mL | Freq: Once | INTRAVENOUS | Status: AC | PRN
  Administered 2017-01-21: 75 mL via INTRAVENOUS

## 2017-01-23 ENCOUNTER — Telehealth: Payer: Self-pay | Admitting: Internal Medicine

## 2017-01-23 NOTE — Telephone Encounter (Signed)
[name] called in to check on patient appointment to make sure he had transportation.

## 2017-01-24 ENCOUNTER — Encounter: Payer: Self-pay | Admitting: Medical Oncology

## 2017-01-24 ENCOUNTER — Ambulatory Visit (HOSPITAL_BASED_OUTPATIENT_CLINIC_OR_DEPARTMENT_OTHER): Payer: Medicare Other

## 2017-01-24 ENCOUNTER — Ambulatory Visit (HOSPITAL_BASED_OUTPATIENT_CLINIC_OR_DEPARTMENT_OTHER): Payer: Medicare Other | Admitting: Internal Medicine

## 2017-01-24 ENCOUNTER — Other Ambulatory Visit: Payer: Self-pay | Admitting: Medical Oncology

## 2017-01-24 ENCOUNTER — Encounter: Payer: Self-pay | Admitting: Internal Medicine

## 2017-01-24 VITALS — BP 144/101 | HR 73 | Temp 97.6°F | Resp 18 | Ht 75.0 in | Wt 139.2 lb

## 2017-01-24 DIAGNOSIS — C3492 Malignant neoplasm of unspecified part of left bronchus or lung: Secondary | ICD-10-CM

## 2017-01-24 DIAGNOSIS — I255 Ischemic cardiomyopathy: Secondary | ICD-10-CM | POA: Diagnosis not present

## 2017-01-24 DIAGNOSIS — C3412 Malignant neoplasm of upper lobe, left bronchus or lung: Secondary | ICD-10-CM

## 2017-01-24 LAB — RESEARCH LABS

## 2017-01-24 NOTE — Progress Notes (Signed)
DISCONTINUE ON PATHWAY REGIMEN - Non-Small Cell Lung     A cycle is every 14 days:     Durvalumab   **Always confirm dose/schedule in your pharmacy ordering system**    REASON: Toxicities / Adverse Event PRIOR TREATMENT: [id number]: Durvalumab 10 mg/kg q14 Days x up to 12 Months TREATMENT RESPONSE: Progressive Disease (PD)  START ON PATHWAY REGIMEN - Non-Small Cell Lung     A cycle is every 21 days:     Paclitaxel      Carboplatin   **Always confirm dose/schedule in your pharmacy ordering system**    Patient Characteristics: Stage IV Metastatic, Squamous, PS = 0, 1, First Line, PD-L1 Expression Positive 1-49% (TPS) / Negative / Not Tested / Not a Candidate for Immunotherapy/Awaiting Test Results AJCC T Category: T2b Current Disease Status: Distant Metastases AJCC N Category: N2 AJCC M Category: M0 AJCC 8 Stage Grouping: IIIA Histology: Squamous Cell Line of therapy: First Line PD-L1 Expression Status: Did Not Order Test Performance Status: PS = 0, 1 Would you be surprised if this patient died  in the next year<= I would NOT be surprised if this patient died in the next year Intent of Therapy: Non-Curative / Palliative Intent, Discussed with Patient

## 2017-01-24 NOTE — Progress Notes (Signed)
[name] [phone]   Fax:(336) [phone]  OFFICE PROGRESS NOTE  Adrian Borg, MD [place] [address]  DIAGNOSIS: Stage IIIa (T2b, N2, M0) non-small cell lung cancer, squamous cell carcinoma presented with left upper lobe lung mass in addition to AP window lymphadenopathy diagnosed in February 2018.  PRIOR THERAPY:  1) Concurrent chemoradiation therapy. He received radiation for approximately 2-3 weeks prior to starting his chemotherapy. He completed his radiation on 07/05/2016. He received 2 doses of weekly carboplatin for an AUC of 2 and paclitaxel 45 mg meter squared which was last given on 07/03/2016. 2) consolidation treatment with immunotherapy with Imfinzi (Durvalumab) 10 MG/KG every 2 weeks. First dose 08/22/2016. Status post 2 cycles.  Last dose was given September 19, 2016 discontinued secondary to intolerance and significant skin rash.  CURRENT THERAPY:  Systemic chemotherapy with carboplatin for AC of 5 and paclitaxel 175 mg/M2 every 3 weeks.  First dose February 06, 2017.  INTERVAL HISTORY: [name] 66 y.o. male returns to the clinic today for follow-up visit accompanied by his sister.  The patient is feeling fine today with no specific complaints.  He denied having any chest pain, shortness breath, cough or hemoptysis.  He denied having any fever or chills.  He has no nausea, vomiting, diarrhea or constipation.  He has no significant weight loss or night sweats.  He has been on observation for the last few months after he developed significant skin rash secondary to treatment with immunotherapy.  The patient had repeat CT scan of the chest performed recently and he is here for evaluation and discussion of his discuss results.   MEDICAL HISTORY: Past Medical History:  Diagnosis Date  . AAA (abdominal aortic aneurysm) ([place])   . Abdominal aortic aneurysm ([place])    AAA And bilateral common iliac artery aneurysms  . BPH  (benign prostatic hyperplasia)   . CAD (coronary artery disease)    Dr Ron Parker @ [facility]   . Chronic mental illness 04/21/2011   Diagnosis unclear,  Family provides good care  . COPD (chronic obstructive pulmonary disease) ([place]) 04/21/2011   smoking  . Drug-induced skin rash 09/05/2016  . Ejection fraction    EF 35-40%, echo, May, 2010  . Hyperlipidemia   . Hyperplastic colon polyp 04/21/2011  . Hypertension   . Preop cardiovascular exam    Cardiac clearance for major vascular surgery, May, 2013  . Pulmonary nodule    Chest CT May, 2013, 2 small pulmonary nodules, needs appropriate followup,  this CT was not ordered by the cardiology team  . PVD (peripheral vascular disease) ([place]) 04/21/2011  . Retention of urine   . Shortness of breath   . Squamous cell lung cancer, left ([place]) 06/13/2016  . Tobacco abuse     ALLERGIES:  is allergic to ace inhibitors.  MEDICATIONS:  Current Outpatient Medications  Medication Sig Dispense Refill  . aspirin EC 81 MG tablet Take 81 mg by mouth daily.    . rosuvastatin (CRESTOR) 40 MG tablet TAKE 1 TABLET BY MOUTH EVERY DAY 90 tablet 0  . prochlorperazine (COMPAZINE) 10 MG tablet Take 1 tablet (10 mg total) by mouth every 6 (six) hours as needed for nausea or vomiting. (Patient not taking: Reported on 01/24/2017) 30 tablet 0  . sucralfate (CARAFATE) 1 GM/10ML suspension Take 10 mLs (1 g total) by mouth 4 (four) times daily -  with meals and at bedtime. (Patient not taking:  Reported on 01/24/2017) 420 mL 0   No current facility-administered medications for this visit.     SURGICAL HISTORY:  Past Surgical History:  Procedure Laterality Date  . ABDOMINAL AORTA STENT  07/2011  . ABDOMINAL AORTAGRAM N/A 07/26/2011   Procedure: ABDOMINAL Maxcine Ham;  Surgeon: Conrad Boise, MD;  Location: [facility] CATH LAB;  Service: Cardiovascular;  Laterality: N/A;  . ABDOMINAL AORTIC ANEURYSM REPAIR  07/2011  . CORONARY ARTERY BYPASS GRAFT  07/2008   CABG X5/notes 07/12/2010  .  EMBOLIZATION Left 07/26/2011   Procedure: EMBOLIZATION;  Surgeon: Conrad Gasquet, MD;  Location: [facility] CATH LAB;  Service: Cardiovascular;  Laterality: Left;  . TRANSURETHRAL RESECTION OF PROSTATE  11/29/2011   Procedure: TRANSURETHRAL RESECTION OF THE PROSTATE WITH GYRUS INSTRUMENTS;  Surgeon: Malka So, MD;  Location: [facility] ORS;  Service: Urology;  Laterality: N/A;  Prostate Ultrasound, and Biopsy  . VIDEO BRONCHOSCOPY Bilateral 04/12/2016   Procedure: VIDEO BRONCHOSCOPY WITH FLUORO;  Surgeon: Juanito Doom, MD;  Location: [facility];  Service: Cardiopulmonary;  Laterality: Bilateral;    REVIEW OF SYSTEMS:  Constitutional: positive for fatigue Eyes: negative Ears, nose, mouth, throat, and face: negative Respiratory: negative Cardiovascular: negative Gastrointestinal: negative Genitourinary:negative Integument/breast: negative Hematologic/lymphatic: negative Musculoskeletal:negative Neurological: negative Behavioral/Psych: negative Endocrine: negative Allergic/Immunologic: negative   PHYSICAL EXAMINATION: General appearance: alert, cooperative and no distress Head: Normocephalic, without obvious abnormality, atraumatic Neck: no adenopathy, no JVD, supple, symmetrical, trachea midline and thyroid not enlarged, symmetric, no tenderness/mass/nodules Lymph nodes: Cervical, supraclavicular, and axillary nodes normal. Resp: clear to auscultation bilaterally Back: symmetric, no curvature. ROM normal. No CVA tenderness. Cardio: regular rate and rhythm, S1, S2 normal, no murmur, click, rub or gallop GI: soft, non-tender; bowel sounds normal; no masses,  no organomegaly Extremities: extremities normal, atraumatic, no cyanosis or edema Neurologic: Alert and oriented X 3, normal strength and tone. Normal symmetric reflexes. Normal coordination and gait    ECOG PERFORMANCE STATUS: 1 - Symptomatic but completely ambulatory  Blood pressure (!) 144/101, pulse 73, temperature 97.6 F (36.4 C),  temperature source Oral, resp. rate 18, height 6\' 3"  (1.905 m), weight 139 lb 3.2 oz (63.1 kg), SpO2 100 %.  LABORATORY DATA: Lab Results  Component Value Date   WBC 5.3 01/16/2017   HGB 13.1 01/16/2017   HCT 41.0 01/16/2017   MCV 91.5 01/16/2017   PLT 202 01/16/2017      Chemistry      Component Value Date/Time   NA 142 01/16/2017 1154   K 4.0 01/16/2017 1154   CL 102 05/02/2016 1608   CO2 24 01/16/2017 1154   BUN 30.6 (H) 01/16/2017 1154   CREATININE 1.2 01/16/2017 1154      Component Value Date/Time   CALCIUM 9.6 01/16/2017 1154   ALKPHOS 82 01/16/2017 1154   AST 20 01/16/2017 1154   ALT 13 01/16/2017 1154   BILITOT 0.25 01/16/2017 1154       RADIOGRAPHIC STUDIES: Ct Chest W Contrast  Result Date: 01/21/2017 CLINICAL DATA:  Stage IIIA squamous cell left upper lobe lung carcinoma diagnosed February 2018 status post concurrent chemo radiation therapy. Radiation completed 07/05/2016. EXAM: CT CHEST WITH CONTRAST TECHNIQUE: Multidetector CT imaging of the chest was performed during intravenous contrast administration. CONTRAST:  39mL ISOVUE-300 IOPAMIDOL (ISOVUE-300) INJECTION 61% COMPARISON:  08/08/2016 chest CT. FINDINGS: Cardiovascular: Normal heart size. No significant pericardial fluid/thickening. Three-vessel coronary atherosclerosis status post CABG. Atherosclerotic nonaneurysmal thoracic aorta. Normal caliber pulmonary arteries. No central pulmonary emboli. Mediastinum/Nodes: No discrete thyroid nodules. Unremarkable esophagus.  No pathologically enlarged axillary, mediastinal or hilar lymph nodes. AP window 0.7 cm node (series 2/ image 50) is decreased from 1.0 cm. Lungs/Pleura: No pneumothorax. No pleural effusion. Severe centrilobular and paraseptal emphysema with mild diffuse bronchial wall thickening. Cavitary apical left upper lobe 3.0 x 1.6 cm lung mass (series 7/ image 24), previously 3.2 x 3.1 cm, decreased. New solid 2.0 x 1.8 cm pulmonary nodule in the medial  apical left upper lobe at the medial margin of the primary cavitary left upper lobe lung mass (series 7/ image 31). New 0.5 cm peripheral left upper lobe pulmonary nodule (series 7/image 35). Anterior left lower lobe 0.5 cm solid pulmonary nodule (series 7/image 84) is stable. No significant pulmonary nodules in the right lung. Upper abdomen: Partially visualized stent graft in the upper abdominal aorta. Simple 1.2 cm upper right renal cyst. Musculoskeletal: No aggressive appearing focal osseous lesions. Intact sternotomy wires. IMPRESSION: 1. Although the primary apical left upper lobe cavitary lung mass is decreased in size, there is a new 2.0 cm solid pulmonary nodule at the medial/central margin of this primary lung mass. Additional 0.5 cm new left upper lobe pulmonary nodule. These findings are suspicious for new metastatic disease in the left upper lobe. 2. No residual pathologically enlarged thoracic lymph nodes. AP window lymph node has decreased in size. Aortic Atherosclerosis (ICD10-I70.0) and Emphysema (ICD10-J43.9). Electronically Signed   By: Ilona Sorrel M.D.   On: 01/21/2017 14:32    ASSESSMENT AND PLAN:  This is a very pleasant 66 years old African-American male with unresectable a stage IIIa non-small cell lung cancer, status post a course of concurrent chemoradiation with weekly carboplatin and paclitaxel and tolerated the treatment well. His scan showed no evidence for disease progression and the patient was started on consolidation treatment with immunotherapy with Imfinzi (Durvalumab) status post 2 cycles.  This treatment was discontinued secondary to a significant skin rash treated with steroids with recurrence after the patient was resumed again. He has been on observation for the last few months. The patient is feeling fine. Repeat CT scan of the chest showed improvement of the primary apical left upper lobe cavitary mass but there was development of new 2.0 cm solid pulmonary nodules  at the medial central margin of the primary mass in addition to another new 0.5 cm left upper lobe pulmonary nodule.  These findings are suspicious for metastatic disease. I personally and independently reviewed the scan images and discussed the results with the patient and his sister today. I discussed with the patient and his treatment options including palliative care versus consideration of treatment with palliative systemic chemotherapy with carboplatin for AC of 5 and paclitaxel 175 mg/M2 every 3 weeks. I discussed with the patient and his sister the adverse effect of this treatment including but not limited to alopecia, myelosuppression, nausea and vomiting, peripheral neuropathy, liver or renal dysfunction. The patient would like to proceed with the treatment as planned and he is expected to start the first dose of this treatment on February 06, 2017. He will come back for follow-up visit 1 week after the first cycle for evaluation and management of any adverse effect of his treatment. The patient was advised to call immediately if he has any concerning symptoms in the interval. The patient voices understanding of current disease status and treatment options and is in agreement with the current care plan. All questions were answered. The patient knows to call the clinic with any problems, questions or concerns. We can certainly  see the patient much sooner if necessary.   Disclaimer: This note was dictated with voice recognition software. Similar sounding words can inadvertently be transcribed and may not be corrected upon review.

## 2017-01-24 NOTE — Patient Instructions (Signed)
Steps to Quit Smoking Smoking tobacco can be bad for your health. It can also affect almost every organ in your body. Smoking puts you and people around you at risk for many serious long-lasting (chronic) diseases. Quitting smoking is hard, but it is one of the best things that you can do for your health. It is never too late to quit. What are the benefits of quitting smoking? When you quit smoking, you lower your risk for getting serious diseases and conditions. They can include:  Lung cancer or lung disease.  Heart disease.  Stroke.  Heart attack.  Not being able to have children (infertility).  Weak bones (osteoporosis) and broken bones (fractures).  If you have coughing, wheezing, and shortness of breath, those symptoms may get better when you quit. You may also get sick less often. If you are pregnant, quitting smoking can help to lower your chances of having a baby of low birth weight. What can I do to help me quit smoking? Talk with your doctor about what can help you quit smoking. Some things you can do (strategies) include:  Quitting smoking totally, instead of slowly cutting back how much you smoke over a period of time.  Going to in-person counseling. You are more likely to quit if you go to many counseling sessions.  Using resources and support systems, such as: ? Online chats with a counselor. ? Phone quitlines. ? Printed self-help materials. ? Support groups or group counseling. ? Text messaging programs. ? Mobile phone apps or applications.  Taking medicines. Some of these medicines may have nicotine in them. If you are pregnant or breastfeeding, do not take any medicines to quit smoking unless your doctor says it is okay. Talk with your doctor about counseling or other things that can help you.  Talk with your doctor about using more than one strategy at the same time, such as taking medicines while you are also going to in-person counseling. This can help make  quitting easier. What things can I do to make it easier to quit? Quitting smoking might feel very hard at first, but there is a lot that you can do to make it easier. Take these steps:  Talk to your family and friends. Ask them to support and encourage you.  Call phone quitlines, reach out to support groups, or work with a counselor.  Ask people who smoke to not smoke around you.  Avoid places that make you want (trigger) to smoke, such as: ? Bars. ? Parties. ? Smoke-break areas at work.  Spend time with people who do not smoke.  Lower the stress in your life. Stress can make you want to smoke. Try these things to help your stress: ? Getting regular exercise. ? Deep-breathing exercises. ? Yoga. ? Meditating. ? Doing a body scan. To do this, close your eyes, focus on one area of your body at a time from head to toe, and notice which parts of your body are tense. Try to relax the muscles in those areas.  Download or buy apps on your mobile phone or tablet that can help you stick to your quit plan. There are many free apps, such as QuitGuide from the CDC (Centers for Disease Control and Prevention). You can find more support from smokefree.gov and other websites.  This information is not intended to replace advice given to you by your health care provider. Make sure you discuss any questions you have with your health care provider. Document Released: 12/23/2008 Document   Revised: 10/25/2015 Document Reviewed: 07/13/2014 Elsevier Interactive Patient Education  2018 Elsevier Inc.  

## 2017-02-04 ENCOUNTER — Telehealth: Payer: Self-pay | Admitting: Medical Oncology

## 2017-02-04 NOTE — Telephone Encounter (Signed)
asking when is first chemo. Note sent to schedulers.

## 2017-02-06 ENCOUNTER — Other Ambulatory Visit: Payer: Medicare Other

## 2017-02-06 ENCOUNTER — Telehealth: Payer: Self-pay | Admitting: Medical Oncology

## 2017-02-06 ENCOUNTER — Ambulatory Visit: Payer: Medicare Other

## 2017-02-06 NOTE — Telephone Encounter (Signed)
Message to JAARS X to call [name] re appts.

## 2017-02-07 ENCOUNTER — Telehealth: Payer: Self-pay | Admitting: Internal Medicine

## 2017-02-07 NOTE — Telephone Encounter (Signed)
Per Message from [place] - called [name] and left message with next appt time and date.

## 2017-02-12 ENCOUNTER — Telehealth: Payer: Self-pay | Admitting: Internal Medicine

## 2017-02-12 NOTE — Telephone Encounter (Signed)
Spoke to patients sister regarding upcoming December through January appointments

## 2017-02-13 ENCOUNTER — Other Ambulatory Visit: Payer: Medicare Other

## 2017-02-13 ENCOUNTER — Ambulatory Visit: Payer: Medicare Other | Admitting: Nurse Practitioner

## 2017-02-18 ENCOUNTER — Ambulatory Visit: Payer: Medicare Other

## 2017-02-18 ENCOUNTER — Other Ambulatory Visit: Payer: Medicare Other

## 2017-02-18 ENCOUNTER — Ambulatory Visit: Payer: Medicare Other | Admitting: Oncology

## 2017-02-20 ENCOUNTER — Other Ambulatory Visit: Payer: Medicare Other

## 2017-02-20 ENCOUNTER — Other Ambulatory Visit: Payer: Self-pay | Admitting: Internal Medicine

## 2017-02-27 ENCOUNTER — Ambulatory Visit: Payer: Medicare Other | Admitting: Oncology

## 2017-02-27 ENCOUNTER — Other Ambulatory Visit: Payer: Medicare Other

## 2017-02-27 ENCOUNTER — Telehealth: Payer: Self-pay | Admitting: Oncology

## 2017-02-27 ENCOUNTER — Ambulatory Visit: Payer: Medicare Other

## 2017-02-27 ENCOUNTER — Other Ambulatory Visit (HOSPITAL_BASED_OUTPATIENT_CLINIC_OR_DEPARTMENT_OTHER): Payer: Medicare Other

## 2017-02-27 ENCOUNTER — Ambulatory Visit (HOSPITAL_BASED_OUTPATIENT_CLINIC_OR_DEPARTMENT_OTHER): Payer: Medicare Other | Admitting: Oncology

## 2017-02-27 VITALS — BP 125/83 | HR 76 | Temp 97.7°F | Resp 18 | Ht 75.0 in | Wt 134.8 lb

## 2017-02-27 DIAGNOSIS — C3412 Malignant neoplasm of upper lobe, left bronchus or lung: Secondary | ICD-10-CM

## 2017-02-27 DIAGNOSIS — Z79899 Other long term (current) drug therapy: Secondary | ICD-10-CM | POA: Diagnosis not present

## 2017-02-27 DIAGNOSIS — C3492 Malignant neoplasm of unspecified part of left bronchus or lung: Secondary | ICD-10-CM

## 2017-02-27 DIAGNOSIS — I255 Ischemic cardiomyopathy: Secondary | ICD-10-CM

## 2017-02-27 DIAGNOSIS — Z5111 Encounter for antineoplastic chemotherapy: Secondary | ICD-10-CM

## 2017-02-27 DIAGNOSIS — R5382 Chronic fatigue, unspecified: Secondary | ICD-10-CM

## 2017-02-27 LAB — COMPREHENSIVE METABOLIC PANEL
ALBUMIN: 3.8 g/dL (ref 3.5–5.0)
ALK PHOS: 104 U/L (ref 40–150)
ALT: 10 U/L (ref 0–55)
AST: 18 U/L (ref 5–34)
Anion Gap: 12 mEq/L — ABNORMAL HIGH (ref 3–11)
BUN: 20.9 mg/dL (ref 7.0–26.0)
CALCIUM: 9.8 mg/dL (ref 8.4–10.4)
CO2: 25 mEq/L (ref 22–29)
CREATININE: 1.5 mg/dL — AB (ref 0.7–1.3)
Chloride: 103 mEq/L (ref 98–109)
EGFR: 55 mL/min/{1.73_m2} — ABNORMAL LOW (ref >60)
GLUCOSE: 92 mg/dL (ref 70–140)
Potassium: 4.6 mEq/L (ref 3.5–5.1)
SODIUM: 139 meq/L (ref 136–145)
Total Bilirubin: 0.44 mg/dL (ref 0.20–1.20)
Total Protein: 7.9 g/dL (ref 6.4–8.3)

## 2017-02-27 LAB — CBC WITH DIFFERENTIAL/PLATELET
BASO%: 0.7 % (ref 0.0–2.0)
Basophils Absolute: 0 10*3/uL (ref 0.0–0.1)
EOS%: 2.5 % (ref 0.0–7.0)
Eosinophils Absolute: 0.2 10*3/uL (ref 0.0–0.5)
HEMATOCRIT: 40.2 % (ref 38.4–49.9)
HEMOGLOBIN: 13.2 g/dL (ref 13.0–17.1)
LYMPH#: 0.7 10*3/uL — AB (ref 0.9–3.3)
LYMPH%: 11.2 % — ABNORMAL LOW (ref 14.0–49.0)
MCH: 29.6 pg (ref 27.2–33.4)
MCHC: 32.7 g/dL (ref 32.0–36.0)
MCV: 90.5 fL (ref 79.3–98.0)
MONO#: 0.6 10*3/uL (ref 0.1–0.9)
MONO%: 10.2 % (ref 0.0–14.0)
NEUT%: 75.4 % — ABNORMAL HIGH (ref 39.0–75.0)
NEUTROS ABS: 4.8 10*3/uL (ref 1.5–6.5)
Platelets: 203 10*3/uL (ref 140–400)
RBC: 4.44 10*6/uL (ref 4.20–5.82)
RDW: 15.3 % — AB (ref 11.0–14.6)
WBC: 6.3 10*3/uL (ref 4.0–10.3)

## 2017-02-27 LAB — TSH: TSH: 1.21 m[IU]/L (ref 0.320–4.118)

## 2017-02-27 NOTE — Telephone Encounter (Signed)
Gave avs and calendar for January  °

## 2017-02-27 NOTE — Progress Notes (Signed)
[facility] OFFICE PROGRESS NOTE  [name], MD [place] [address]  DIAGNOSIS: Stage IIIa (T2b, N2, M0) non-small cell lung cancer, squamous cell carcinoma presented with left upper lobe lung mass in addition to AP window lymphadenopathy diagnosed in February 2018.  PRIOR THERAPY: 1) Concurrent chemoradiation therapy. He received radiation for approximately 2-3 weeks prior to starting his chemotherapy. He completed his radiation on 07/05/2016. He received 2 doses of weekly carboplatin for an AUC of 2 and paclitaxel 45 mg meter squared which was last given on 07/03/2016. 2) consolidation treatment with immunotherapy with Imfinzi (Durvalumab) 10 MG/KG every 2 weeks. First dose 08/22/2016. Status post 2 cycles.  Last dose was given September 19, 2016 discontinued secondary to intolerance and significant skin rash.  CURRENT THERAPY: Systemic chemotherapy with carboplatin for AC of 5 and paclitaxel 175 mg/M2 every 3 weeks.  First dose 02/28/2017.  INTERVAL HISTORY: [name] 66 y.o. male returns for routine follow-up visit accompanied by his sister.  The patient is feeling fine today with no specific complaints.  He denies fevers and chills.  Denies chest pain, shortness of breath, cough, hemoptysis.  Denies nausea, vomiting, constipation, diarrhea.  He has had no significant weight loss or night sweats.  The patient is here for evaluation prior to cycle #1 of his chemotherapy.  MEDICAL HISTORY: Past Medical History:  Diagnosis Date  . AAA (abdominal aortic aneurysm) ([place])   . Abdominal aortic aneurysm ([place])    AAA And bilateral common iliac artery aneurysms  . BPH (benign prostatic hyperplasia)   . CAD (coronary artery disease)    Dr Ron Parker @ [facility]   . Chronic mental illness 04/21/2011   Diagnosis unclear,  Family provides good care  . COPD (chronic obstructive pulmonary disease) ([place]) 04/21/2011   smoking  . Drug-induced skin rash 09/05/2016  .  Ejection fraction    EF 35-40%, echo, May, 2010  . Hyperlipidemia   . Hyperplastic colon polyp 04/21/2011  . Hypertension   . Preop cardiovascular exam    Cardiac clearance for major vascular surgery, May, 2013  . Pulmonary nodule    Chest CT May, 2013, 2 small pulmonary nodules, needs appropriate followup,  this CT was not ordered by the cardiology team  . PVD (peripheral vascular disease) ([place]) 04/21/2011  . Retention of urine   . Shortness of breath   . Squamous cell lung cancer, left ([place]) 06/13/2016  . Tobacco abuse     ALLERGIES:  is allergic to ace inhibitors.  MEDICATIONS:  Current Outpatient Medications  Medication Sig Dispense Refill  . aspirin EC 81 MG tablet Take 81 mg by mouth daily.    . prochlorperazine (COMPAZINE) 10 MG tablet Take 1 tablet (10 mg total) by mouth every 6 (six) hours as needed for nausea or vomiting. (Patient not taking: Reported on 01/24/2017) 30 tablet 0  . rosuvastatin (CRESTOR) 40 MG tablet TAKE 1 TABLET BY MOUTH EVERY DAY 90 tablet 0  . sucralfate (CARAFATE) 1 GM/10ML suspension Take 10 mLs (1 g total) by mouth 4 (four) times daily -  with meals and at bedtime. (Patient not taking: Reported on 01/24/2017) 420 mL 0   No current facility-administered medications for this visit.     SURGICAL HISTORY:  Past Surgical History:  Procedure Laterality Date  . ABDOMINAL AORTA STENT  07/2011  . ABDOMINAL AORTAGRAM N/A 07/26/2011   Procedure: ABDOMINAL Maxcine Ham;  Surgeon: Conrad Winslow, MD;  Location: [facility] CATH LAB;  Service: Cardiovascular;  Laterality: N/A;  . ABDOMINAL AORTIC ANEURYSM REPAIR  07/2011  . CORONARY ARTERY BYPASS GRAFT  07/2008   CABG X5/notes 07/12/2010  . EMBOLIZATION Left 07/26/2011   Procedure: EMBOLIZATION;  Surgeon: Conrad McLeansville, MD;  Location: [facility] CATH LAB;  Service: Cardiovascular;  Laterality: Left;  . TRANSURETHRAL RESECTION OF PROSTATE  11/29/2011   Procedure: TRANSURETHRAL RESECTION OF THE PROSTATE WITH GYRUS INSTRUMENTS;  Surgeon: Malka So, MD;  Location: [facility] ORS;  Service: Urology;  Laterality: N/A;  Prostate Ultrasound, and Biopsy  . VIDEO BRONCHOSCOPY Bilateral 04/12/2016   Procedure: VIDEO BRONCHOSCOPY WITH FLUORO;  Surgeon: Juanito Doom, MD;  Location: [facility];  Service: Cardiopulmonary;  Laterality: Bilateral;    REVIEW OF SYSTEMS:   Review of Systems  Constitutional: Negative for appetite change, chills, fatigue, fever and unexpected weight change.  HENT:   Negative for mouth sores, nosebleeds, sore throat and trouble swallowing.   Eyes: Negative for eye problems and icterus.  Respiratory: Negative for cough, hemoptysis, shortness of breath and wheezing.   Cardiovascular: Negative for chest pain and leg swelling.  Gastrointestinal: Negative for abdominal pain, constipation, diarrhea, nausea and vomiting.  Genitourinary: Negative for bladder incontinence, difficulty urinating, dysuria, frequency and hematuria.   Musculoskeletal: Negative for back pain, gait problem, neck pain and neck stiffness.  Skin: Negative for itching and rash.  Neurological: Negative for dizziness, extremity weakness, gait problem, headaches, light-headedness and seizures.  Hematological: Negative for adenopathy. Does not bruise/bleed easily.  Psychiatric/Behavioral: Negative for confusion, depression and sleep disturbance. The patient is not nervous/anxious.     PHYSICAL EXAMINATION:  Blood pressure 125/83, pulse 76, temperature 97.7 F (36.5 C), temperature source Oral, resp. rate 18, height 6\' 3"  (1.905 m), weight 134 lb 12.8 oz (61.1 kg), SpO2 99 %.  ECOG PERFORMANCE STATUS: 1 - Symptomatic but completely ambulatory  Physical Exam  Constitutional: Oriented to person, place, and time and well-developed, well-nourished, and in no distress. No distress.  HENT:  Head: Normocephalic and atraumatic.  Mouth/Throat: Oropharynx is clear and moist. No oropharyngeal exudate.  Eyes: Conjunctivae are normal. Right eye exhibits no  discharge. Left eye exhibits no discharge. No scleral icterus.  Neck: Normal range of motion. Neck supple.  Cardiovascular: Normal rate, regular rhythm, normal heart sounds and intact distal pulses.   Pulmonary/Chest: Effort normal and breath sounds normal. No respiratory distress. No wheezes. No rales.  Abdominal: Soft. Bowel sounds are normal. Exhibits no distension and no mass. There is no tenderness.  Musculoskeletal: Normal range of motion. Exhibits no edema.  Lymphadenopathy:    No cervical adenopathy.  Neurological: Alert and oriented to person, place, and time. Exhibits normal muscle tone. Gait normal. Coordination normal.  Skin: Skin is warm and dry. No rash noted. Not diaphoretic. No erythema. No pallor.  Psychiatric: Mood, memory and judgment normal.  Vitals reviewed.  LABORATORY DATA: Lab Results  Component Value Date   WBC 6.3 02/27/2017   HGB 13.2 02/27/2017   HCT 40.2 02/27/2017   MCV 90.5 02/27/2017   PLT 203 02/27/2017      Chemistry      Component Value Date/Time   NA 139 02/27/2017 1418   K 4.6 02/27/2017 1418   CL 102 05/02/2016 1608   CO2 25 02/27/2017 1418   BUN 20.9 02/27/2017 1418   CREATININE 1.5 (H) 02/27/2017 1418      Component Value Date/Time   CALCIUM 9.8 02/27/2017 1418   ALKPHOS 104 02/27/2017 1418   AST 18 02/27/2017 1418  ALT 10 02/27/2017 1418   BILITOT 0.44 02/27/2017 1418       RADIOGRAPHIC STUDIES:  No results found.   ASSESSMENT/PLAN:  Squamous cell lung cancer, left ([place]) This is a very pleasant 66 year old African-American male with unresectable a stage IIIa non-small cell lung cancer, status post a course of concurrent chemoradiation with weekly carboplatin and paclitaxel and tolerated the treatment well. His scan showed no evidence for disease progression and the patient was started on consolidation treatment with immunotherapy with Imfinzi (Durvalumab) status post 2 cycles.  This treatment was discontinued secondary to a  significant skin rash treated with steroids with recurrence after the patient was resumed again. He has been on observation for the last few months. Repeat CT scan of the chest showed improvement of the primary apical left upper lobe cavitary mass but there was development of new 2.0 cm solid pulmonary nodules at the medial central margin of the primary mass in addition to another new 0.5 cm left upper lobe pulmonary nodule.  These findings are suspicious for metastatic disease. The patient is here to begin palliative systemic chemotherapy with carboplatin for AUC of 5 and paclitaxel 175 mg/M2 every 3 weeks. I have reviewed with the patient and his sister the adverse effect of this treatment including but not limited to alopecia, myelosuppression, nausea and vomiting, peripheral neuropathy, liver or renal dysfunction. Recommend that the patient proceed with cycle 1 of his chemotherapy as scheduled on 02/28/2017.  He will have weekly labs while on chemotherapy.  Return visit will be in 3 weeks for evaluation prior to cycle #2.  The patient was advised to call immediately if he has any concerning symptoms in the interval. The patient voices understanding of current disease status and treatment options and is in agreement with the current care plan. All questions were answered. The patient knows to call the clinic with any problems, questions or concerns. We can certainly see the patient much sooner if necessary.  No orders of the defined types were placed in this encounter.  Mikey Bussing, DNP, AGPCNP-BC, AOCNP 02/28/17

## 2017-02-28 ENCOUNTER — Other Ambulatory Visit: Payer: Medicare Other

## 2017-02-28 ENCOUNTER — Ambulatory Visit (HOSPITAL_BASED_OUTPATIENT_CLINIC_OR_DEPARTMENT_OTHER): Payer: Medicare Other

## 2017-02-28 ENCOUNTER — Encounter: Payer: Self-pay | Admitting: Oncology

## 2017-02-28 VITALS — BP 127/83 | HR 64 | Temp 98.1°F | Resp 18

## 2017-02-28 DIAGNOSIS — Z5189 Encounter for other specified aftercare: Secondary | ICD-10-CM | POA: Diagnosis not present

## 2017-02-28 DIAGNOSIS — C3412 Malignant neoplasm of upper lobe, left bronchus or lung: Secondary | ICD-10-CM | POA: Diagnosis not present

## 2017-02-28 DIAGNOSIS — Z5111 Encounter for antineoplastic chemotherapy: Secondary | ICD-10-CM | POA: Diagnosis present

## 2017-02-28 DIAGNOSIS — C3492 Malignant neoplasm of unspecified part of left bronchus or lung: Secondary | ICD-10-CM

## 2017-02-28 MED ORDER — SODIUM CHLORIDE 0.9 % IV SOLN
175.0000 mg/m2 | Freq: Once | INTRAVENOUS | Status: AC
  Administered 2017-02-28: 318 mg via INTRAVENOUS
  Filled 2017-02-28: qty 53

## 2017-02-28 MED ORDER — SODIUM CHLORIDE 0.9 % IV SOLN
20.0000 mg | Freq: Once | INTRAVENOUS | Status: AC
  Administered 2017-02-28: 20 mg via INTRAVENOUS
  Filled 2017-02-28: qty 2

## 2017-02-28 MED ORDER — DIPHENHYDRAMINE HCL 50 MG/ML IJ SOLN
50.0000 mg | Freq: Once | INTRAMUSCULAR | Status: AC
  Administered 2017-02-28: 50 mg via INTRAVENOUS

## 2017-02-28 MED ORDER — FAMOTIDINE IN NACL 20-0.9 MG/50ML-% IV SOLN
20.0000 mg | Freq: Once | INTRAVENOUS | Status: AC
  Administered 2017-02-28: 20 mg via INTRAVENOUS

## 2017-02-28 MED ORDER — SODIUM CHLORIDE 0.9 % IV SOLN
Freq: Once | INTRAVENOUS | Status: AC
  Administered 2017-02-28: 13:00:00 via INTRAVENOUS

## 2017-02-28 MED ORDER — DIPHENHYDRAMINE HCL 50 MG/ML IJ SOLN
INTRAMUSCULAR | Status: AC
  Filled 2017-02-28: qty 1

## 2017-02-28 MED ORDER — PEGFILGRASTIM 6 MG/0.6ML ~~LOC~~ PSKT
6.0000 mg | PREFILLED_SYRINGE | Freq: Once | SUBCUTANEOUS | Status: AC
  Administered 2017-02-28: 6 mg via SUBCUTANEOUS

## 2017-02-28 MED ORDER — PEGFILGRASTIM 6 MG/0.6ML ~~LOC~~ PSKT
PREFILLED_SYRINGE | SUBCUTANEOUS | Status: AC
  Filled 2017-02-28: qty 0.6

## 2017-02-28 MED ORDER — PALONOSETRON HCL INJECTION 0.25 MG/5ML
INTRAVENOUS | Status: AC
  Filled 2017-02-28: qty 5

## 2017-02-28 MED ORDER — SODIUM CHLORIDE 0.9 % IV SOLN
330.0000 mg | Freq: Once | INTRAVENOUS | Status: AC
  Administered 2017-02-28: 330 mg via INTRAVENOUS
  Filled 2017-02-28: qty 33

## 2017-02-28 MED ORDER — PALONOSETRON HCL INJECTION 0.25 MG/5ML
0.2500 mg | Freq: Once | INTRAVENOUS | Status: AC
  Administered 2017-02-28: 0.25 mg via INTRAVENOUS

## 2017-02-28 MED ORDER — FAMOTIDINE IN NACL 20-0.9 MG/50ML-% IV SOLN
INTRAVENOUS | Status: AC
  Filled 2017-02-28: qty 50

## 2017-02-28 NOTE — Assessment & Plan Note (Signed)
This is a very pleasant 66 year old African-American male with unresectable a stage IIIa non-small cell lung cancer, status post a course of concurrent chemoradiation with weekly carboplatin and paclitaxel and tolerated the treatment well. His scan showed no evidence for disease progression and the patient was started on consolidation treatment with immunotherapy with Imfinzi (Durvalumab) status post 2 cycles.  This treatment was discontinued secondary to a significant skin rash treated with steroids with recurrence after the patient was resumed again. He has been on observation for the last few months. Repeat CT scan of the chest showed improvement of the primary apical left upper lobe cavitary mass but there was development of new 2.0 cm solid pulmonary nodules at the medial central margin of the primary mass in addition to another new 0.5 cm left upper lobe pulmonary nodule.  These findings are suspicious for metastatic disease. The patient is here to begin palliative systemic chemotherapy with carboplatin for AUC of 5 and paclitaxel 175 mg/M2 every 3 weeks. I have reviewed with the patient and his sister the adverse effect of this treatment including but not limited to alopecia, myelosuppression, nausea and vomiting, peripheral neuropathy, liver or renal dysfunction. Recommend that the patient proceed with cycle 1 of his chemotherapy as scheduled on 02/28/2017.  He will have weekly labs while on chemotherapy.  Return visit will be in 3 weeks for evaluation prior to cycle #2.  The patient was advised to call immediately if he has any concerning symptoms in the interval. The patient voices understanding of current disease status and treatment options and is in agreement with the current care plan. All questions were answered. The patient knows to call the clinic with any problems, questions or concerns. We can certainly see the patient much sooner if necessary.

## 2017-02-28 NOTE — Patient Instructions (Addendum)
[facility] Discharge Instructions for Patients Receiving Chemotherapy  Today you received the following chemotherapy agents  Taxol and Carboplatin.   To help prevent nausea and vomiting after your treatment, we encourage you to take your nausea medication as directed.   If you develop nausea and vomiting that is not controlled by your nausea medication, call the clinic.   BELOW ARE SYMPTOMS THAT SHOULD BE REPORTED IMMEDIATELY:  *FEVER GREATER THAN 100.5 F  *CHILLS WITH OR WITHOUT FEVER  NAUSEA AND VOMITING THAT IS NOT CONTROLLED WITH YOUR NAUSEA MEDICATION  *UNUSUAL SHORTNESS OF BREATH  *UNUSUAL BRUISING OR BLEEDING  TENDERNESS IN MOUTH AND THROAT WITH OR WITHOUT PRESENCE OF ULCERS  *URINARY PROBLEMS  *BOWEL PROBLEMS  UNUSUAL RASH Items with * indicate a potential emergency and should be followed up as soon as possible.  Feel free to call the clinic should you have any questions or concerns. The clinic phone number is [phone].  Please show the [facility] at check-in to the Emergency Department and triage nurse. Paclitaxel injection What is this medicine? PACLITAXEL (PAK li TAX el) is a chemotherapy drug. It targets fast dividing cells, like cancer cells, and causes these cells to die. This medicine is used to treat ovarian cancer, breast cancer, and other cancers. This medicine may be used for other purposes; ask your health care provider or pharmacist if you have questions. COMMON BRAND NAME(S): Onxol, Taxol What should I tell my health care provider before I take this medicine? They need to know if you have any of these conditions: -blood disorders -irregular heartbeat -infection (especially a virus infection such as chickenpox, cold sores, or herpes) -liver disease -previous or ongoing radiation therapy -an unusual or allergic reaction to paclitaxel, alcohol, polyoxyethylated castor oil, other chemotherapy agents, other medicines, foods,  dyes, or preservatives -pregnant or trying to get pregnant -breast-feeding How should I use this medicine? This drug is given as an infusion into a vein. It is administered in a hospital or clinic by a specially trained health care professional. Talk to your pediatrician regarding the use of this medicine in children. Special care may be needed. Overdosage: If you think you have taken too much of this medicine contact a poison control center or emergency room at once. NOTE: This medicine is only for you. Do not share this medicine with others. What if I miss a dose? It is important not to miss your dose. Call your doctor or health care professional if you are unable to keep an appointment. What may interact with this medicine? Do not take this medicine with any of the following medications: -disulfiram -metronidazole This medicine may also interact with the following medications: -cyclosporine -diazepam -ketoconazole -medicines to increase blood counts like filgrastim, pegfilgrastim, sargramostim -other chemotherapy drugs like cisplatin, doxorubicin, epirubicin, etoposide, teniposide, vincristine -quinidine -testosterone -vaccines -verapamil Talk to your doctor or health care professional before taking any of these medicines: -acetaminophen -aspirin -ibuprofen -ketoprofen -naproxen This list may not describe all possible interactions. Give your health care provider a list of all the medicines, herbs, non-prescription drugs, or dietary supplements you use. Also tell them if you smoke, drink alcohol, or use illegal drugs. Some items may interact with your medicine. What should I watch for while using this medicine? Your condition will be monitored carefully while you are receiving this medicine. You will need important blood work done while you are taking this medicine. This medicine can cause serious allergic reactions. To reduce your risk you will  need to take other medicine(s)  before treatment with this medicine. If you experience allergic reactions like skin rash, itching or hives, swelling of the face, lips, or tongue, tell your doctor or health care professional right away. In some cases, you may be given additional medicines to help with side effects. Follow all directions for their use. This drug may make you feel generally unwell. This is not uncommon, as chemotherapy can affect healthy cells as well as cancer cells. Report any side effects. Continue your course of treatment even though you feel ill unless your doctor tells you to stop. Call your doctor or health care professional for advice if you get a fever, chills or sore throat, or other symptoms of a cold or flu. Do not treat yourself. This drug decreases your body's ability to fight infections. Try to avoid being around people who are sick. This medicine may increase your risk to bruise or bleed. Call your doctor or health care professional if you notice any unusual bleeding. Be careful brushing and flossing your teeth or using a toothpick because you may get an infection or bleed more easily. If you have any dental work done, tell your dentist you are receiving this medicine. Avoid taking products that contain aspirin, acetaminophen, ibuprofen, naproxen, or ketoprofen unless instructed by your doctor. These medicines may hide a fever. Do not become pregnant while taking this medicine. Women should inform their doctor if they wish to become pregnant or think they might be pregnant. There is a potential for serious side effects to an unborn child. Talk to your health care professional or pharmacist for more information. Do not breast-feed an infant while taking this medicine. Men are advised not to father a child while receiving this medicine. This product may contain alcohol. Ask your pharmacist or healthcare provider if this medicine contains alcohol. Be sure to tell all healthcare providers you are taking this  medicine. Certain medicines, like metronidazole and disulfiram, can cause an unpleasant reaction when taken with alcohol. The reaction includes flushing, headache, nausea, vomiting, sweating, and increased thirst. The reaction can last from 30 minutes to several hours. What side effects may I notice from receiving this medicine? Side effects that you should report to your doctor or health care professional as soon as possible: -allergic reactions like skin rash, itching or hives, swelling of the face, lips, or tongue -low blood counts - This drug may decrease the number of white blood cells, red blood cells and platelets. You may be at increased risk for infections and bleeding. -signs of infection - fever or chills, cough, sore throat, pain or difficulty passing urine -signs of decreased platelets or bleeding - bruising, pinpoint red spots on the skin, black, tarry stools, nosebleeds -signs of decreased red blood cells - unusually weak or tired, fainting spells, lightheadedness -breathing problems -chest pain -high or low blood pressure -mouth sores -nausea and vomiting -pain, swelling, redness or irritation at the injection site -pain, tingling, numbness in the hands or feet -slow or irregular heartbeat -swelling of the ankle, feet, hands Side effects that usually do not require medical attention (report to your doctor or health care professional if they continue or are bothersome): -bone pain -complete hair loss including hair on your head, underarms, pubic hair, eyebrows, and eyelashes -changes in the color of fingernails -diarrhea -loosening of the fingernails -loss of appetite -muscle or joint pain -red flush to skin -sweating This list may not describe all possible side effects. Call your doctor for medical  advice about side effects. You may report side effects to FDA at 1-800-FDA-1088. Where should I keep my medicine? This drug is given in a hospital or clinic and will not be  stored at home. NOTE: This sheet is a summary. It may not cover all possible information. If you have questions about this medicine, talk to your doctor, pharmacist, or health care provider.  2018 [employer] Standard (2014-12-28 19:58:00)  Carboplatin injection What is this medicine? CARBOPLATIN (KAR boe pla tin) is a chemotherapy drug. It targets fast dividing cells, like cancer cells, and causes these cells to die. This medicine is used to treat ovarian cancer and many other cancers. This medicine may be used for other purposes; ask your health care provider or pharmacist if you have questions. COMMON BRAND NAME(S): Paraplatin What should I tell my health care provider before I take this medicine? They need to know if you have any of these conditions: -blood disorders -hearing problems -kidney disease -recent or ongoing radiation therapy -an unusual or allergic reaction to carboplatin, cisplatin, other chemotherapy, other medicines, foods, dyes, or preservatives -pregnant or trying to get pregnant -breast-feeding How should I use this medicine? This drug is usually given as an infusion into a vein. It is administered in a hospital or clinic by a specially trained health care professional. Talk to your pediatrician regarding the use of this medicine in children. Special care may be needed. Overdosage: If you think you have taken too much of this medicine contact a poison control center or emergency room at once. NOTE: This medicine is only for you. Do not share this medicine with others. What if I miss a dose? It is important not to miss a dose. Call your doctor or health care professional if you are unable to keep an appointment. What may interact with this medicine? -medicines for seizures -medicines to increase blood counts like filgrastim, pegfilgrastim, sargramostim -some antibiotics like amikacin, gentamicin, neomycin, streptomycin, tobramycin -vaccines Talk to your doctor or  health care professional before taking any of these medicines: -acetaminophen -aspirin -ibuprofen -ketoprofen -naproxen This list may not describe all possible interactions. Give your health care provider a list of all the medicines, herbs, non-prescription drugs, or dietary supplements you use. Also tell them if you smoke, drink alcohol, or use illegal drugs. Some items may interact with your medicine. What should I watch for while using this medicine? Your condition will be monitored carefully while you are receiving this medicine. You will need important blood work done while you are taking this medicine. This drug may make you feel generally unwell. This is not uncommon, as chemotherapy can affect healthy cells as well as cancer cells. Report any side effects. Continue your course of treatment even though you feel ill unless your doctor tells you to stop. In some cases, you may be given additional medicines to help with side effects. Follow all directions for their use. Call your doctor or health care professional for advice if you get a fever, chills or sore throat, or other symptoms of a cold or flu. Do not treat yourself. This drug decreases your body's ability to fight infections. Try to avoid being around people who are sick. This medicine may increase your risk to bruise or bleed. Call your doctor or health care professional if you notice any unusual bleeding. Be careful brushing and flossing your teeth or using a toothpick because you may get an infection or bleed more easily. If you have any dental work done, tell your  dentist you are receiving this medicine. Avoid taking products that contain aspirin, acetaminophen, ibuprofen, naproxen, or ketoprofen unless instructed by your doctor. These medicines may hide a fever. Do not become pregnant while taking this medicine. Women should inform their doctor if they wish to become pregnant or think they might be pregnant. There is a potential for  serious side effects to an unborn child. Talk to your health care professional or pharmacist for more information. Do not breast-feed an infant while taking this medicine. What side effects may I notice from receiving this medicine? Side effects that you should report to your doctor or health care professional as soon as possible: -allergic reactions like skin rash, itching or hives, swelling of the face, lips, or tongue -signs of infection - fever or chills, cough, sore throat, pain or difficulty passing urine -signs of decreased platelets or bleeding - bruising, pinpoint red spots on the skin, black, tarry stools, nosebleeds -signs of decreased red blood cells - unusually weak or tired, fainting spells, lightheadedness -breathing problems -changes in hearing -changes in vision -chest pain -high blood pressure -low blood counts - This drug may decrease the number of white blood cells, red blood cells and platelets. You may be at increased risk for infections and bleeding. -nausea and vomiting -pain, swelling, redness or irritation at the injection site -pain, tingling, numbness in the hands or feet -problems with balance, talking, walking -trouble passing urine or change in the amount of urine Side effects that usually do not require medical attention (report to your doctor or health care professional if they continue or are bothersome): -hair loss -loss of appetite -metallic taste in the mouth or changes in taste This list may not describe all possible side effects. Call your doctor for medical advice about side effects. You may report side effects to FDA at 1-800-FDA-1088. Where should I keep my medicine? This drug is given in a hospital or clinic and will not be stored at home. NOTE: This sheet is a summary. It may not cover all possible information. If you have questions about this medicine, talk to your doctor, pharmacist, or health care provider.  2018 [employer]vier/Gold Standard  (2007-06-03 14:38:05)     Pegfilgrastim injection What is this medicine? PEGFILGRASTIM (PEG fil gra stim) is a long-acting granulocyte colony-stimulating factor that stimulates the growth of neutrophils, a type of white blood cell important in the body's fight against infection. It is used to reduce the incidence of fever and infection in patients with certain types of cancer who are receiving chemotherapy that affects the bone marrow, and to increase survival after being exposed to high doses of radiation. This medicine may be used for other purposes; ask your health care provider or pharmacist if you have questions. COMMON BRAND NAME(S): Neulasta What should I tell my health care provider before I take this medicine? They need to know if you have any of these conditions: -kidney disease -latex allergy -ongoing radiation therapy -sickle cell disease -skin reactions to acrylic adhesives (On-Body Injector only) -an unusual or allergic reaction to pegfilgrastim, filgrastim, other medicines, foods, dyes, or preservatives -pregnant or trying to get pregnant -breast-feeding How should I use this medicine? This medicine is for injection under the skin. If you get this medicine at home, you will be taught how to prepare and give the pre-filled syringe or how to use the On-body Injector. Refer to the patient Instructions for Use for detailed instructions. Use exactly as directed. Tell your healthcare provider immediately if you  suspect that the On-body Injector may not have performed as intended or if you suspect the use of the On-body Injector resulted in a missed or partial dose. It is important that you put your used needles and syringes in a special sharps container. Do not put them in a trash can. If you do not have a sharps container, call your pharmacist or healthcare provider to get one. Talk to your pediatrician regarding the use of this medicine in children. While this drug may be prescribed  for selected conditions, precautions do apply. Overdosage: If you think you have taken too much of this medicine contact a poison control center or emergency room at once. NOTE: This medicine is only for you. Do not share this medicine with others. What if I miss a dose? It is important not to miss your dose. Call your doctor or health care professional if you miss your dose. If you miss a dose due to an On-body Injector failure or leakage, a new dose should be administered as soon as possible using a single prefilled syringe for manual use. What may interact with this medicine? Interactions have not been studied. Give your health care provider a list of all the medicines, herbs, non-prescription drugs, or dietary supplements you use. Also tell them if you smoke, drink alcohol, or use illegal drugs. Some items may interact with your medicine. This list may not describe all possible interactions. Give your health care provider a list of all the medicines, herbs, non-prescription drugs, or dietary supplements you use. Also tell them if you smoke, drink alcohol, or use illegal drugs. Some items may interact with your medicine. What should I watch for while using this medicine? You may need blood work done while you are taking this medicine. If you are going to need a MRI, CT scan, or other procedure, tell your doctor that you are using this medicine (On-Body Injector only). What side effects may I notice from receiving this medicine? Side effects that you should report to your doctor or health care professional as soon as possible: -allergic reactions like skin rash, itching or hives, swelling of the face, lips, or tongue -dizziness -fever -pain, redness, or irritation at site where injected -pinpoint red spots on the skin -red or dark-brown urine -shortness of breath or breathing problems -stomach or side pain, or pain at the shoulder -swelling -tiredness -trouble passing urine or change in the  amount of urine Side effects that usually do not require medical attention (report to your doctor or health care professional if they continue or are bothersome): -bone pain -muscle pain This list may not describe all possible side effects. Call your doctor for medical advice about side effects. You may report side effects to FDA at 1-800-FDA-1088. Where should I keep my medicine? Keep out of the reach of children. Store pre-filled syringes in a refrigerator between 2 and 8 degrees C (36 and 46 degrees F). Do not freeze. Keep in carton to protect from light. Throw away this medicine if it is left out of the refrigerator for more than 48 hours. Throw away any unused medicine after the expiration date. NOTE: This sheet is a summary. It may not cover all possible information. If you have questions about this medicine, talk to your doctor, pharmacist, or health care provider.  2018 [employer] Standard (2016-02-23 12:58:03)

## 2017-03-01 ENCOUNTER — Ambulatory Visit: Payer: Medicare Other | Admitting: Internal Medicine

## 2017-03-01 ENCOUNTER — Other Ambulatory Visit: Payer: Medicare Other

## 2017-03-06 ENCOUNTER — Other Ambulatory Visit (HOSPITAL_BASED_OUTPATIENT_CLINIC_OR_DEPARTMENT_OTHER): Payer: Medicare Other

## 2017-03-06 ENCOUNTER — Other Ambulatory Visit: Payer: Medicare Other

## 2017-03-06 DIAGNOSIS — C3412 Malignant neoplasm of upper lobe, left bronchus or lung: Secondary | ICD-10-CM

## 2017-03-06 DIAGNOSIS — C3492 Malignant neoplasm of unspecified part of left bronchus or lung: Secondary | ICD-10-CM

## 2017-03-06 LAB — CBC WITH DIFFERENTIAL/PLATELET
BASO%: 0.2 % (ref 0.0–2.0)
BASOS ABS: 0 10*3/uL (ref 0.0–0.1)
EOS ABS: 0.8 10*3/uL — AB (ref 0.0–0.5)
EOS%: 4.2 % (ref 0.0–7.0)
HCT: 41.8 % (ref 38.4–49.9)
HGB: 13.4 g/dL (ref 13.0–17.1)
LYMPH%: 6 % — AB (ref 14.0–49.0)
MCH: 29.5 pg (ref 27.2–33.4)
MCHC: 32.1 g/dL (ref 32.0–36.0)
MCV: 92.1 fL (ref 79.3–98.0)
MONO#: 1.2 10*3/uL — AB (ref 0.1–0.9)
MONO%: 6.5 % (ref 0.0–14.0)
NEUT#: 16 10*3/uL — ABNORMAL HIGH (ref 1.5–6.5)
NEUT%: 83.1 % — AB (ref 39.0–75.0)
PLATELETS: 166 10*3/uL (ref 140–400)
RBC: 4.54 10*6/uL (ref 4.20–5.82)
RDW: 14.9 % — ABNORMAL HIGH (ref 11.0–14.6)
WBC: 19.2 10*3/uL — AB (ref 4.0–10.3)
lymph#: 1.2 10*3/uL (ref 0.9–3.3)

## 2017-03-06 LAB — COMPREHENSIVE METABOLIC PANEL
ALK PHOS: 138 U/L (ref 40–150)
ALT: 13 U/L (ref 0–55)
ANION GAP: 9 meq/L (ref 3–11)
AST: 18 U/L (ref 5–34)
Albumin: 3.7 g/dL (ref 3.5–5.0)
BUN: 24.5 mg/dL (ref 7.0–26.0)
CHLORIDE: 103 meq/L (ref 98–109)
CO2: 27 mEq/L (ref 22–29)
CREATININE: 1.6 mg/dL — AB (ref 0.7–1.3)
Calcium: 9.9 mg/dL (ref 8.4–10.4)
EGFR: 52 mL/min/{1.73_m2} — AB (ref >60)
Glucose: 98 mg/dl (ref 70–140)
POTASSIUM: 4.2 meq/L (ref 3.5–5.1)
SODIUM: 139 meq/L (ref 136–145)
TOTAL PROTEIN: 7.8 g/dL (ref 6.4–8.3)
Total Bilirubin: 0.44 mg/dL (ref 0.20–1.20)

## 2017-03-07 ENCOUNTER — Telehealth: Payer: Self-pay | Admitting: Medical Oncology

## 2017-03-07 NOTE — Telephone Encounter (Signed)
Per [name] pt is doing well after chemo.

## 2017-03-08 ENCOUNTER — Ambulatory Visit: Payer: Medicare Other | Admitting: Oncology

## 2017-03-11 ENCOUNTER — Ambulatory Visit: Payer: Medicare Other

## 2017-03-11 ENCOUNTER — Other Ambulatory Visit: Payer: Medicare Other

## 2017-03-11 ENCOUNTER — Ambulatory Visit: Payer: Medicare Other | Admitting: Nurse Practitioner

## 2017-03-13 ENCOUNTER — Other Ambulatory Visit (HOSPITAL_BASED_OUTPATIENT_CLINIC_OR_DEPARTMENT_OTHER): Payer: Medicare Other

## 2017-03-13 ENCOUNTER — Other Ambulatory Visit: Payer: Medicare Other

## 2017-03-13 DIAGNOSIS — C3412 Malignant neoplasm of upper lobe, left bronchus or lung: Secondary | ICD-10-CM

## 2017-03-13 DIAGNOSIS — C3492 Malignant neoplasm of unspecified part of left bronchus or lung: Secondary | ICD-10-CM

## 2017-03-13 LAB — CBC WITH DIFFERENTIAL/PLATELET
BASO%: 0.6 % (ref 0.0–2.0)
BASOS ABS: 0.1 10*3/uL (ref 0.0–0.1)
EOS ABS: 0.1 10*3/uL (ref 0.0–0.5)
EOS%: 0.6 % (ref 0.0–7.0)
HCT: 38.1 % — ABNORMAL LOW (ref 38.4–49.9)
HGB: 12.5 g/dL — ABNORMAL LOW (ref 13.0–17.1)
LYMPH%: 5.1 % — ABNORMAL LOW (ref 14.0–49.0)
MCH: 29.9 pg (ref 27.2–33.4)
MCHC: 32.7 g/dL (ref 32.0–36.0)
MCV: 91.5 fL (ref 79.3–98.0)
MONO#: 0.6 10*3/uL (ref 0.1–0.9)
MONO%: 5 % (ref 0.0–14.0)
NEUT%: 88.7 % — ABNORMAL HIGH (ref 39.0–75.0)
NEUTROS ABS: 11.4 10*3/uL — AB (ref 1.5–6.5)
Platelets: 218 10*3/uL (ref 140–400)
RBC: 4.16 10*6/uL — ABNORMAL LOW (ref 4.20–5.82)
RDW: 15.7 % — ABNORMAL HIGH (ref 11.0–14.6)
WBC: 12.8 10*3/uL — AB (ref 4.0–10.3)
lymph#: 0.7 10*3/uL — ABNORMAL LOW (ref 0.9–3.3)

## 2017-03-13 LAB — COMPREHENSIVE METABOLIC PANEL
ALT: 14 U/L (ref 0–55)
ANION GAP: 9 meq/L (ref 3–11)
AST: 16 U/L (ref 5–34)
Albumin: 3.4 g/dL — ABNORMAL LOW (ref 3.5–5.0)
Alkaline Phosphatase: 118 U/L (ref 40–150)
BILIRUBIN TOTAL: 0.36 mg/dL (ref 0.20–1.20)
BUN: 18.6 mg/dL (ref 7.0–26.0)
CHLORIDE: 104 meq/L (ref 98–109)
CO2: 26 meq/L (ref 22–29)
CREATININE: 1.2 mg/dL (ref 0.7–1.3)
Calcium: 8.9 mg/dL (ref 8.4–10.4)
EGFR: >60 mL/min/{1.73_m2} (ref >60)
Glucose: 78 mg/dl (ref 70–140)
Potassium: 3.9 mEq/L (ref 3.5–5.1)
Sodium: 139 mEq/L (ref 136–145)
TOTAL PROTEIN: 7.4 g/dL (ref 6.4–8.3)

## 2017-03-17 ENCOUNTER — Encounter (HOSPITAL_COMMUNITY): Payer: Self-pay

## 2017-03-17 ENCOUNTER — Emergency Department (HOSPITAL_COMMUNITY): Payer: Medicare Other

## 2017-03-17 ENCOUNTER — Emergency Department (HOSPITAL_COMMUNITY)
Admission: EM | Admit: 2017-03-17 | Discharge: 2017-03-17 | Disposition: A | Discharge status: Home / Self Care | Payer: Medicare Other | Attending: Emergency Medicine | Admitting: Emergency Medicine

## 2017-03-17 DIAGNOSIS — R112 Nausea with vomiting, unspecified: Secondary | ICD-10-CM | POA: Diagnosis not present

## 2017-03-17 DIAGNOSIS — R109 Unspecified abdominal pain: Secondary | ICD-10-CM | POA: Diagnosis not present

## 2017-03-17 DIAGNOSIS — R1084 Generalized abdominal pain: Secondary | ICD-10-CM | POA: Diagnosis not present

## 2017-03-17 LAB — COMPREHENSIVE METABOLIC PANEL
ALT: 15 U/L — ABNORMAL LOW (ref 17–63)
AST: 21 U/L (ref 15–41)
Albumin: 3.6 g/dL (ref 3.5–5.0)
Alkaline Phosphatase: 97 U/L (ref 38–126)
Anion gap: 9 (ref 5–15)
BUN: 21 mg/dL — ABNORMAL HIGH (ref 6–20)
CO2: 27 mmol/L (ref 22–32)
Calcium: 9.6 mg/dL (ref 8.9–10.3)
Chloride: 103 mmol/L (ref 101–111)
Creatinine, Ser: 1.11 mg/dL (ref 0.61–1.24)
GFR calc Af Amer: >60 mL/min (ref >60)
GFR calc non Af Amer: >60 mL/min (ref >60)
Glucose, Bld: 119 mg/dL — ABNORMAL HIGH (ref 65–99)
Potassium: 3.9 mmol/L (ref 3.5–5.1)
Sodium: 139 mmol/L (ref 135–145)
Total Bilirubin: 0.8 mg/dL (ref 0.3–1.2)
Total Protein: 7.4 g/dL (ref 6.5–8.1)

## 2017-03-17 LAB — CBC WITH DIFFERENTIAL/PLATELET
Basophils Absolute: 0 10*3/uL (ref 0.0–0.1)
Basophils Relative: 0 %
Eosinophils Absolute: 0 10*3/uL (ref 0.0–0.7)
Eosinophils Relative: 0 %
HCT: 40.1 % (ref 39.0–52.0)
Hemoglobin: 13.1 g/dL (ref 13.0–17.0)
Lymphocytes Relative: 5 %
Lymphs Abs: 0.5 10*3/uL — ABNORMAL LOW (ref 0.7–4.0)
MCH: 29.8 pg (ref 26.0–34.0)
MCHC: 32.7 g/dL (ref 30.0–36.0)
MCV: 91.1 fL (ref 78.0–100.0)
Monocytes Absolute: 0.4 10*3/uL (ref 0.1–1.0)
Monocytes Relative: 4 %
Neutro Abs: 9.1 10*3/uL — ABNORMAL HIGH (ref 1.7–7.7)
Neutrophils Relative %: 91 %
Platelets: 263 10*3/uL (ref 150–400)
RBC: 4.4 MIL/uL (ref 4.22–5.81)
RDW: 15.1 % (ref 11.5–15.5)
WBC: 10.1 10*3/uL (ref 4.0–10.5)

## 2017-03-17 LAB — URINALYSIS, ROUTINE W REFLEX MICROSCOPIC
Bilirubin Urine: NEGATIVE
Glucose, UA: 50 mg/dL — AB
Hgb urine dipstick: NEGATIVE
Ketones, ur: NEGATIVE mg/dL
Leukocytes, UA: NEGATIVE
Nitrite: NEGATIVE
Protein, ur: NEGATIVE mg/dL
Specific Gravity, Urine: 1.027 (ref 1.005–1.030)
pH: 5 (ref 5.0–8.0)

## 2017-03-17 LAB — LIPASE, BLOOD: Lipase: 21 U/L (ref 11–51)

## 2017-03-17 MED ORDER — ONDANSETRON 4 MG PO TBDP: 4.0000 mg | ORAL_TABLET | Freq: Three times a day (TID) | ORAL | 0 refills | Status: DC | PRN

## 2017-03-17 MED ORDER — ONDANSETRON HCL 4 MG/2ML IJ SOLN
4.0000 mg | Freq: Once | INTRAMUSCULAR | Status: AC
  Administered 2017-03-17: 4 mg via INTRAVENOUS
  Filled 2017-03-17: qty 2

## 2017-03-17 MED ORDER — MORPHINE SULFATE (PF) 4 MG/ML IV SOLN
4.0000 mg | Freq: Once | INTRAVENOUS | Status: AC
  Administered 2017-03-17: 4 mg via INTRAVENOUS
  Filled 2017-03-17: qty 1

## 2017-03-17 MED ORDER — PSYLLIUM 58.6 % PO PACK: 1.0000 | PACK | Freq: Every day | ORAL | 12 refills | Status: DC

## 2017-03-17 MED ORDER — IOPAMIDOL (ISOVUE-300) INJECTION 61%
INTRAVENOUS | Status: AC
  Filled 2017-03-17: qty 100

## 2017-03-17 MED ORDER — SODIUM CHLORIDE 0.9 % IV BOLUS (SEPSIS)
500.0000 mL | Freq: Once | INTRAVENOUS | Status: AC
  Administered 2017-03-17: 500 mL via INTRAVENOUS

## 2017-03-17 NOTE — ED Triage Notes (Signed)
Pt with abdominal pain, rt abdomen with n/v/d since last night.  Some difficulty with urination.

## 2017-03-17 NOTE — ED Provider Notes (Signed)
Pt presents with complaints of abdominal pain, primarily right sided and vomiting and diarrhea.  Noted to be hypertensive.  Mild ttp ruq and rlq.  No pulsatile mass on exam.  Will proceed with labs, CT scan to evaluate further.  Will treat pain and monitor BP to see if it improves.  Medical screening examination/treatment/procedure(s) were conducted as a shared visit with non-physician practitioner(s) and myself.  I personally evaluated the patient during the encounter.     Dorie Rank, MD 03/17/17 [phone]

## 2017-03-17 NOTE — Discharge Instructions (Signed)
Start taking Metamucil mixed in 8 ounces of water for constipation for the next week or so.  Take Zofran as needed for nausea.  Drink plenty of fluids and get plenty of rest.  Eat a diet of bland foods that will not upset your stomach for the next few days such as saltine crackers, mashed potatoes, and plain chicken.  Follow-up for your infusion in 3 days as scheduled.  Follow-up with your primary care physician for reevaluation of symptoms if they persist.  Return to the ED immediately for any concerning signs or symptoms develop such as fevers, blood in the urine or stool, or if you are unable to keep any food or drink down.

## 2017-03-17 NOTE — ED Provider Notes (Signed)
[facility] DEPT Provider Note   CSN: [id number] Arrival date & time: 03/17/17  0702     History   Chief Complaint Chief Complaint  Patient presents with  . Abdominal Pain  . Emesis    HPI [name] is a 67 y.o. male with history of COPD, BPH, CAD, AAA, HTN, HLD, PVD, and squamous cell lung cancer presents today with chief complaint acute onset, constant abdominal pain.  He states that at around 11 PM last night he developed constant aching and intermittently sharp generalized abdominal pain which he states is worse on the right side.  He endorses nausea and has had 2 episodes of nonbloody nonbilious emesis since symptom onset.  Endorses constipation and cannot tell me the last time he had a bowel movement.  Patient's sister states that he asked her for a laxative 3 or 4 days ago.  No aggravating or alleviating factors noted.  He  denies fevers or chills, no worsening of his chronic shortness of breath and chest pain.  He states he sometimes has difficulty urinating but states this is chronic and unchanged.  Denies urgency, frequency, dysuria, or hematuria.  No melena or hematochezia.  Has not tried anything for his symptoms.  Of note, patient is currently undergoing chemotherapy with infusions every 3 weeks.  He is scheduled for his next infusion in 3 days and states he has been tolerating the therapy without difficulty in general.  The history is provided by the patient and a relative.  Emesis   Associated symptoms include abdominal pain. Pertinent negatives include no chills and no fever.    Past Medical History:  Diagnosis Date  . AAA (abdominal aortic aneurysm) ([place])   . Abdominal aortic aneurysm ([place])    AAA And bilateral common iliac artery aneurysms  . BPH (benign prostatic hyperplasia)   . CAD (coronary artery disease)    Dr Ron Parker @ [place]   . Chronic mental illness 04/21/2011   Diagnosis unclear,  Family provides good care  . COPD (chronic  obstructive pulmonary disease) ([place]) 04/21/2011   smoking  . Drug-induced skin rash 09/05/2016  . Ejection fraction    EF 35-40%, echo, May, 2010  . Hyperlipidemia   . Hyperplastic colon polyp 04/21/2011  . Hypertension   . Preop cardiovascular exam    Cardiac clearance for major vascular surgery, May, 2013  . Pulmonary nodule    Chest CT May, 2013, 2 small pulmonary nodules, needs appropriate followup,  this CT was not ordered by the cardiology team  . PVD (peripheral vascular disease) ([place]) 04/21/2011  . Retention of urine   . Shortness of breath   . Squamous cell lung cancer, left ([place]) 06/13/2016  . Tobacco abuse     Patient Active Problem List   Diagnosis Date Noted  . Encounter for antineoplastic immunotherapy 09/05/2016  . Drug-induced skin rash 09/05/2016  . Esophagitis 07/02/2016  . Squamous cell lung cancer, left ([place]) 06/13/2016  . Goals of care, counseling/discussion 06/13/2016  . Lung cancer ([place]) 04/13/2016  . Syncope 03/28/2016  . Near syncope 03/28/2016  . Smoker 05/28/2014  . Abdominal aneurysm without mention of rupture 08/31/2011  . Aneurysm of iliac artery ([place]) 08/31/2011  . Preop cardiovascular exam   . Abdominal aortic aneurysm ([place])   . Lung mass   . Hypertension   . Hyperlipidemia   . CAD (coronary artery disease)   . Hx of CABG   . Ejection fraction   . Tobacco abuse   .  Iliac artery aneurysm, bilateral ([place]) 06/22/2011  . Fatigue 04/23/2011  . PSA elevation 04/23/2011  . Encounter for antineoplastic chemotherapy 04/21/2011  . COPD (chronic obstructive pulmonary disease) ([place]) 04/21/2011  . Hyperplastic colon polyp 04/21/2011  . PVD (peripheral vascular disease) ([place]) 04/21/2011  . Gastritis and duodenitis 04/21/2011  . Chronic mental illness 04/21/2011  . CONSTIPATION 11/12/2008  . CORONARY ARTERY BYPASS GRAFT, HX OF 11/09/2008  . CARDIOMYOPATHY, ISCHEMIC 08/23/2008  . HEMOCCULT POSITIVE STOOL 08/23/2008    Past Surgical History:  Procedure  Laterality Date  . ABDOMINAL AORTA STENT  07/2011  . ABDOMINAL AORTAGRAM N/A 07/26/2011   Procedure: ABDOMINAL Maxcine Ham;  Surgeon: Conrad Woodbury, MD;  Location: [facility] CATH LAB;  Service: Cardiovascular;  Laterality: N/A;  . ABDOMINAL AORTIC ANEURYSM REPAIR  07/2011  . CORONARY ARTERY BYPASS GRAFT  07/2008   CABG X5/notes 07/12/2010  . EMBOLIZATION Left 07/26/2011   Procedure: EMBOLIZATION;  Surgeon: Conrad Plandome, MD;  Location: [facility] CATH LAB;  Service: Cardiovascular;  Laterality: Left;  . TRANSURETHRAL RESECTION OF PROSTATE  11/29/2011   Procedure: TRANSURETHRAL RESECTION OF THE PROSTATE WITH GYRUS INSTRUMENTS;  Surgeon: Malka So, MD;  Location: [facility] ORS;  Service: Urology;  Laterality: N/A;  Prostate Ultrasound, and Biopsy  . VIDEO BRONCHOSCOPY Bilateral 04/12/2016   Procedure: VIDEO BRONCHOSCOPY WITH FLUORO;  Surgeon: Juanito Doom, MD;  Location: [facility];  Service: Cardiopulmonary;  Laterality: Bilateral;       Home Medications    Prior to Admission medications   Medication Sig Start Date End Date Taking? Authorizing Provider  aspirin EC 81 MG tablet Take 81 mg by mouth daily.   Yes [provider]  rosuvastatin (CRESTOR) 40 MG tablet TAKE 1 TABLET BY MOUTH EVERY DAY 07/03/16  Yes [name], MD  Sennosides (SENOKOT PO) Take 1 tablet by mouth daily as needed (CONSTIPATION).   Yes [provider]  ondansetron (ZOFRAN ODT) 4 MG disintegrating tablet Take 1 tablet (4 mg total) by mouth every 8 (eight) hours as needed for nausea or vomiting. 03/17/17   Latissa Frick A, PA-C  prochlorperazine (COMPAZINE) 10 MG tablet Take 1 tablet (10 mg total) by mouth every 6 (six) hours as needed for nausea or vomiting. Patient not taking: Reported on 01/24/2017 06/26/16   Curt Bears, MD  psyllium (METAMUCIL) 58.6 % packet Take 1 packet by mouth daily. 03/17/17   Nikeshia Keetch A, PA-C  sucralfate (CARAFATE) 1 GM/10ML suspension Take 10 mLs (1 g total) by mouth 4 (four) times daily -  with  meals and at bedtime. Patient not taking: Reported on 01/24/2017 07/02/16   Maryanna Shape, NP    Family History Family History  Problem Relation Age of Onset  . Cancer Father        prostate  . Heart disease Father   . Diabetes Father   . Heart attack Father 62  . Arthritis Sister   . Hyperlipidemia Sister   . Hypertension Sister   . Anesthesia problems Neg Hx     Social History Social History   Tobacco Use  . Smoking status: Current Every Day Smoker    Packs/day: 1.00    Years: 50.00    Pack years: 50.00    Types: Cigarettes  . Smokeless tobacco: Never Used  Substance Use Topics  . Alcohol use: No  . Drug use: No     Allergies   Ace inhibitors   Review of Systems Review of Systems  Constitutional: Negative for chills and  fever.  Respiratory: Negative for shortness of breath.   Cardiovascular: Negative for chest pain.  Gastrointestinal: Positive for abdominal pain, constipation, nausea and vomiting. Negative for anal bleeding and blood in stool.  Genitourinary: Positive for difficulty urinating. Negative for discharge, dysuria, hematuria, penile pain, penile swelling, scrotal swelling and testicular pain.  All other systems reviewed and are negative.    Physical Exam Updated Vital Signs BP (!) 158/105 (BP Location: Left Arm)   Pulse 72   Temp 98.2 F (36.8 C) (Oral)   Resp 18   Ht 6\' 3"  (1.905 m)   Wt 60.8 kg (134 lb)   SpO2 98%   BMI 16.75 kg/m   Physical Exam  Constitutional: He appears well-developed and well-nourished. No distress.  HENT:  Head: Normocephalic and atraumatic.  Eyes: Conjunctivae are normal. Right eye exhibits no discharge. Left eye exhibits no discharge.  Neck: No JVD present. No tracheal deviation present.  Cardiovascular: Normal rate and intact distal pulses.  Distant heart sounds  Pulmonary/Chest: Effort normal. He has rhonchi. He exhibits no tenderness.  Scattered rhonchi, equal rise and fall of chest, speaking in full  sentences without difficulty  Abdominal: He exhibits no distension. There is generalized tenderness and tenderness in the left lower quadrant. There is no rebound, no guarding, no tenderness at McBurney's point and negative Murphy's sign.  Pain is worst in the LLQ  Musculoskeletal: He exhibits no edema.  Neurological: He is alert.  Fluent speech, no facial droop.  Generally answers questions appropriately but at times has some inappropriate responses.  Patient's sister states this is his baseline.  Cranial nerves II through XII tested and are intact.  Skin: Skin is warm and dry. No erythema.  Psychiatric: He has a normal mood and affect. His behavior is normal.  Nursing note and vitals reviewed.    ED Treatments / Results  Labs (all labs ordered are listed, but only abnormal results are displayed) Labs Reviewed  CBC WITH DIFFERENTIAL/PLATELET - Abnormal; Notable for the following components:      Result Value   Neutro Abs 9.1 (*)    Lymphs Abs 0.5 (*)    All other components within normal limits  COMPREHENSIVE METABOLIC PANEL - Abnormal; Notable for the following components:   Glucose, Bld 119 (*)    BUN 21 (*)    ALT 15 (*)    All other components within normal limits  URINALYSIS, ROUTINE W REFLEX MICROSCOPIC - Abnormal; Notable for the following components:   Glucose, UA 50 (*)    All other components within normal limits  LIPASE, BLOOD    EKG  EKG Interpretation None       Radiology Ct Abdomen Pelvis W Contrast  Result Date: 03/17/2017 CLINICAL DATA:  Right abdominal pain, nausea/vomiting EXAM: CT ABDOMEN AND PELVIS WITH CONTRAST TECHNIQUE: Multidetector CT imaging of the abdomen and pelvis was performed using the standard protocol following bolus administration of intravenous contrast. CONTRAST:  100 mL Isovue 300 IV COMPARISON:  PET-CT dated 04/25/2016 FINDINGS: Lower chest: Emphysematous changes at the lung bases. Hepatobiliary: Liver is within normal limits.  Gallbladder is unremarkable. No intrahepatic extrahepatic duct dilatation. Pancreas: Within normal limits. Spleen: Within normal limits. Adrenals/Urinary Tract: Adrenal glands are within normal limits. 11 mm cyst along the posterior interpolar right kidney (series 7/ image 18). Left kidney is within normal limits. No hydronephrosis. Distended bladder. Stomach/Bowel: Stomach is mildly distended. No evidence of bowel obstruction. Normal appendix (series 2/ image 56). Vascular/Lymphatic: No evidence of abdominal aortic aneurysm.  Aorto bi-iliac stents. No suspicious abdominopelvic lymphadenopathy. Reproductive: Prostate is grossly unremarkable. Other: No abdominopelvic ascites. Musculoskeletal: Mild degenerative changes of the lower thoracic spine. IMPRESSION: No CT findings to account for the patient's abdominal pain. No evidence of bowel obstruction.  Normal appendix. Ancillary findings as above. Electronically Signed   By: Julian Hy M.D.   On: 03/17/2017 10:24    Procedures Procedures (including critical care time)  Medications Ordered in ED Medications  iopamidol (ISOVUE-300) 61 % injection (not administered)  sodium chloride 0.9 % bolus 500 mL (0 mLs Intravenous Stopped 03/17/17 1015)  morphine 4 MG/ML injection 4 mg (4 mg Intravenous Given 03/17/17 0806)  ondansetron (ZOFRAN) injection 4 mg (4 mg Intravenous Given 03/17/17 0806)     Initial Impression / Assessment and Plan / ED Course  I have reviewed the triage vital signs and the nursing notes.  Pertinent labs & imaging results that were available during my care of the patient were reviewed by me and considered in my medical decision making (see chart for details).     Patient presents with generalized abdominal pain which began around 11 PM last night.  He also endorses nausea and vomiting but has been able to tolerate p.o. food at home.  Initially hypertensive with improvement while in the ED.  Suspect this is primarily in response to  pain.  Vital signs otherwise stable and he is afebrile.  UA is not concerning for UTI.  Lab work is reassuring and generally improved from lab work that was done 4 days ago.  He has no leukocytosis, Altered kidney function, or electrolyte abnormalities.  CT scan of the abdomen shows no acute findings and no evidence of bowel obstruction.  He was given fluids, Zofran, and pain medicine.  On reevaluation he is resting comfortably and states he is feeling much better.  He is tolerating p.o. fluids without difficulty.  I doubt obstruction, perforation,mesenteric ischemia, appendicitis, AAA, or other acute surgical abdominal pathology.  Repeat abdominal examination is unremarkable.  Will discharge with Zofran for nausea and psyllium for constipation.  Discussed indications for return to the ED.  Patient will follow up with his primary care physician for reevaluation if symptoms persist and he will present for his chemotherapy infusion in 3 days as scheduled. Pt verbalized understanding of and agreement with plan and is safe for discharge home at this time.  He has no complaints prior to discharge.  Final Clinical Impressions(s) / ED Diagnoses   Final diagnoses:  Generalized abdominal pain  Nausea and vomiting in adult patient    ED Discharge Orders        Ordered    psyllium (METAMUCIL) 58.6 % packet  Daily     03/17/17 1128    ondansetron (ZOFRAN ODT) 4 MG disintegrating tablet  Every 8 hours PRN     03/17/17 1128       Alen Matheson, Viola A, PA-C 03/17/17 1631    Dorie Rank, MD 03/18/17 [phone]

## 2017-03-20 ENCOUNTER — Ambulatory Visit: Payer: Medicare Other | Admitting: Internal Medicine

## 2017-03-20 ENCOUNTER — Inpatient Hospital Stay (HOSPITAL_BASED_OUTPATIENT_CLINIC_OR_DEPARTMENT_OTHER): Payer: Medicare Other | Admitting: Oncology

## 2017-03-20 ENCOUNTER — Other Ambulatory Visit: Payer: Self-pay | Admitting: Internal Medicine

## 2017-03-20 ENCOUNTER — Inpatient Hospital Stay: Payer: Medicare Other

## 2017-03-20 ENCOUNTER — Other Ambulatory Visit: Payer: Medicare Other

## 2017-03-20 ENCOUNTER — Encounter: Payer: Self-pay | Admitting: Oncology

## 2017-03-20 ENCOUNTER — Ambulatory Visit: Payer: Medicare Other

## 2017-03-20 ENCOUNTER — Inpatient Hospital Stay: Payer: Medicare Other | Attending: Internal Medicine

## 2017-03-20 VITALS — BP 130/77 | HR 89 | Temp 97.5°F | Resp 18 | Ht 75.0 in | Wt 136.6 lb

## 2017-03-20 DIAGNOSIS — Z9221 Personal history of antineoplastic chemotherapy: Secondary | ICD-10-CM

## 2017-03-20 DIAGNOSIS — C3412 Malignant neoplasm of upper lobe, left bronchus or lung: Secondary | ICD-10-CM

## 2017-03-20 DIAGNOSIS — C3492 Malignant neoplasm of unspecified part of left bronchus or lung: Secondary | ICD-10-CM

## 2017-03-20 DIAGNOSIS — Z5189 Encounter for other specified aftercare: Secondary | ICD-10-CM | POA: Diagnosis not present

## 2017-03-20 DIAGNOSIS — Z923 Personal history of irradiation: Secondary | ICD-10-CM | POA: Diagnosis not present

## 2017-03-20 DIAGNOSIS — Z5111 Encounter for antineoplastic chemotherapy: Secondary | ICD-10-CM

## 2017-03-20 LAB — CBC WITH DIFFERENTIAL/PLATELET
Abs Granulocyte: 4 10*3/uL (ref 1.5–6.5)
Basophils Absolute: 0.1 10*3/uL (ref 0.0–0.1)
Basophils Relative: 1 %
EOS ABS: 0.1 10*3/uL (ref 0.0–0.5)
EOS PCT: 2 %
HCT: 38.2 % — ABNORMAL LOW (ref 38.4–49.9)
Hemoglobin: 12.2 g/dL — ABNORMAL LOW (ref 13.0–17.1)
LYMPHS ABS: 0.8 10*3/uL — AB (ref 0.9–3.3)
LYMPHS PCT: 14 %
MCH: 29.4 pg (ref 27.2–33.4)
MCHC: 31.9 g/dL — ABNORMAL LOW (ref 32.0–36.0)
MCV: 92 fL (ref 79.3–98.0)
MONO ABS: 0.6 10*3/uL (ref 0.1–0.9)
Monocytes Relative: 10 %
Neutro Abs: 4 10*3/uL (ref 1.5–6.5)
Neutrophils Relative %: 73 %
PLATELETS: 221 10*3/uL (ref 140–400)
RBC: 4.15 MIL/uL — ABNORMAL LOW (ref 4.20–5.82)
RDW: 15.3 % (ref 11.0–15.6)
WBC: 5.5 10*3/uL (ref 4.0–10.3)

## 2017-03-20 LAB — COMPREHENSIVE METABOLIC PANEL
ALK PHOS: 94 U/L (ref 40–150)
ALT: 11 U/L (ref 0–55)
AST: 18 U/L (ref 5–34)
Albumin: 3.3 g/dL — ABNORMAL LOW (ref 3.5–5.0)
Anion gap: 8 (ref 3–11)
BUN: 18 mg/dL (ref 7–26)
CALCIUM: 9.4 mg/dL (ref 8.4–10.4)
CHLORIDE: 106 mmol/L (ref 98–109)
CO2: 27 mmol/L (ref 22–29)
CREATININE: 1.22 mg/dL (ref 0.70–1.30)
GFR, EST NON AFRICAN AMERICAN: 59 mL/min — AB (ref >60)
Glucose, Bld: 70 mg/dL (ref 70–140)
Potassium: 3.6 mmol/L (ref 3.5–5.1)
Sodium: 141 mmol/L (ref 136–145)
Total Bilirubin: 0.4 mg/dL (ref 0.2–1.2)
Total Protein: 7.2 g/dL (ref 6.4–8.3)

## 2017-03-20 MED ORDER — SODIUM CHLORIDE 0.9 % IV SOLN
20.0000 mg | Freq: Once | INTRAVENOUS | Status: AC
  Administered 2017-03-20: 20 mg via INTRAVENOUS
  Filled 2017-03-20: qty 2

## 2017-03-20 MED ORDER — PACLITAXEL CHEMO INJECTION 300 MG/50ML
175.0000 mg/m2 | Freq: Once | INTRAVENOUS | Status: AC
  Administered 2017-03-20: 318 mg via INTRAVENOUS
  Filled 2017-03-20: qty 53

## 2017-03-20 MED ORDER — DIPHENHYDRAMINE HCL 50 MG/ML IJ SOLN
50.0000 mg | Freq: Once | INTRAMUSCULAR | Status: AC
  Administered 2017-03-20: 50 mg via INTRAVENOUS

## 2017-03-20 MED ORDER — SODIUM CHLORIDE 0.9 % IV SOLN
Freq: Once | INTRAVENOUS | Status: AC
  Administered 2017-03-20: 13:00:00 via INTRAVENOUS

## 2017-03-20 MED ORDER — FAMOTIDINE IN NACL 20-0.9 MG/50ML-% IV SOLN
20.0000 mg | Freq: Once | INTRAVENOUS | Status: AC
  Administered 2017-03-20: 20 mg via INTRAVENOUS

## 2017-03-20 MED ORDER — PEGFILGRASTIM 6 MG/0.6ML ~~LOC~~ PSKT
6.0000 mg | PREFILLED_SYRINGE | Freq: Once | SUBCUTANEOUS | Status: AC
  Administered 2017-03-20: 6 mg via SUBCUTANEOUS

## 2017-03-20 MED ORDER — SODIUM CHLORIDE 0.9 % IV SOLN
388.0000 mg | Freq: Once | INTRAVENOUS | Status: AC
  Administered 2017-03-20: 390 mg via INTRAVENOUS
  Filled 2017-03-20: qty 39

## 2017-03-20 MED ORDER — PALONOSETRON HCL INJECTION 0.25 MG/5ML
0.2500 mg | Freq: Once | INTRAVENOUS | Status: AC
  Administered 2017-03-20: 0.25 mg via INTRAVENOUS

## 2017-03-20 NOTE — Progress Notes (Signed)
[facility] OFFICE PROGRESS NOTE  [name], MD [place] [address]  DIAGNOSIS: Stage IIIa (T2b, N2, M0) non-small cell lung cancer, squamous cell carcinoma presented with left upper lobe lung mass in addition to AP window lymphadenopathy diagnosed in February 2018.  PRIOR THERAPY: 1)Concurrent chemoradiation therapy. He received radiation for approximately 2-3 weeks prior to starting his chemotherapy. He completed his radiation on 07/05/2016. He received 2 doses of weekly carboplatin for an AUC of 2 and paclitaxel 45 mg meter squared which was last given on 07/03/2016. 2)consolidation treatment with immunotherapy with Imfinzi (Durvalumab) 10 MG/KG every 2 weeks. First dose 08/22/2016. Status post 2 cycles.Last dose was given September 19, 2016 discontinued secondary to intolerance and significant skin rash.  CURRENT THERAPY: Systemic chemotherapy with carboplatin for AC of 5 and paclitaxel 175 mg/M2 every 3 weeks. First dose 02/28/2017.  Status post 1 cycle.  INTERVAL HISTORY: ILAI HILLER 67 y.o. male returns for routine follow-up visit by himself.  The patient is feeling fine today has no specific complaints.  He was seen in the emergency room several days ago for abdominal pain and nausea.  Abdominal CT was performed which did not show any acute abnormality.  He was given IV fluids and Zofran with improvement of his symptoms.  He denies having any other nausea or, vomiting, or abdominal pain since his last visit.  He denies fevers and chills.  Denies chest pain, shortness breath, cough, hemoptysis.  He has no constipation or diarrhea.  He has not had a significant weight loss or night sweats.  The patient is here for evaluation prior to cycle 2 of his chemotherapy.  MEDICAL HISTORY: Past Medical History:  Diagnosis Date  . AAA (abdominal aortic aneurysm) ([place])   . Abdominal aortic aneurysm ([place])    AAA And bilateral common iliac artery aneurysms   . BPH (benign prostatic hyperplasia)   . CAD (coronary artery disease)    Dr Ron Parker @ [place]   . Chronic mental illness 04/21/2011   Diagnosis unclear,  Family provides good care  . COPD (chronic obstructive pulmonary disease) ([facility] [facility]) 04/21/2011   smoking  . Drug-induced skin rash 09/05/2016  . Ejection fraction    EF 35-40%, echo, May, 2010  . Hyperlipidemia   . Hyperplastic colon polyp 04/21/2011  . Hypertension   . Preop cardiovascular exam    Cardiac clearance for major vascular surgery, May, 2013  . Pulmonary nodule    Chest CT May, 2013, 2 small pulmonary nodules, needs appropriate followup,  this CT was not ordered by the cardiology team  . PVD (peripheral vascular disease) ([place]) 04/21/2011  . Retention of urine   . Shortness of breath   . Squamous cell lung cancer, left ([facility]) 06/13/2016  . Tobacco abuse     ALLERGIES:  is allergic to ace inhibitors.  MEDICATIONS:  Current Outpatient Medications  Medication Sig Dispense Refill  . aspirin EC 81 MG tablet Take 81 mg by mouth daily.    . ondansetron (ZOFRAN ODT) 4 MG disintegrating tablet Take 1 tablet (4 mg total) by mouth every 8 (eight) hours as needed for nausea or vomiting. 10 tablet 0  . prochlorperazine (COMPAZINE) 10 MG tablet Take 1 tablet (10 mg total) by mouth every 6 (six) hours as needed for nausea or vomiting. 30 tablet 0  . psyllium (METAMUCIL) 58.6 % packet Take 1 packet by mouth daily. 30 each 12  . rosuvastatin (CRESTOR) 40 MG tablet TAKE  1 TABLET BY MOUTH EVERY DAY 90 tablet 0  . Sennosides (SENOKOT PO) Take 1 tablet by mouth daily as needed (CONSTIPATION).    [name] sucralfate (CARAFATE) 1 GM/10ML suspension Take 10 mLs (1 g total) by mouth 4 (four) times daily -  with meals and at bedtime. (Patient not taking: Reported on 01/24/2017) 420 mL 0   No current facility-administered medications for this visit.     SURGICAL HISTORY:  Past Surgical History:  Procedure Laterality Date  . ABDOMINAL AORTA STENT  07/2011  .  ABDOMINAL AORTAGRAM N/A 07/26/2011   Procedure: ABDOMINAL Maxcine Ham;  Surgeon: Conrad Nanakuli, MD;  Location: [facility] CATH LAB;  Service: Cardiovascular;  Laterality: N/A;  . ABDOMINAL AORTIC ANEURYSM REPAIR  07/2011  . CORONARY ARTERY BYPASS GRAFT  07/2008   CABG X5/notes 07/12/2010  . EMBOLIZATION Left 07/26/2011   Procedure: EMBOLIZATION;  Surgeon: Conrad , MD;  Location: [facility] CATH LAB;  Service: Cardiovascular;  Laterality: Left;  . TRANSURETHRAL RESECTION OF PROSTATE  11/29/2011   Procedure: TRANSURETHRAL RESECTION OF THE PROSTATE WITH GYRUS INSTRUMENTS;  Surgeon: Malka So, MD;  Location: [facility] ORS;  Service: Urology;  Laterality: N/A;  Prostate Ultrasound, and Biopsy  . VIDEO BRONCHOSCOPY Bilateral 04/12/2016   Procedure: VIDEO BRONCHOSCOPY WITH FLUORO;  Surgeon: Juanito Doom, MD;  Location: [facility];  Service: Cardiopulmonary;  Laterality: Bilateral;    REVIEW OF SYSTEMS:   Review of Systems  Constitutional: Negative for appetite change, chills, fatigue, fever and unexpected weight change.  HENT:   Negative for mouth sores, nosebleeds, sore throat and trouble swallowing.   Eyes: Negative for eye problems and icterus.  Respiratory: Negative for cough, hemoptysis, shortness of breath and wheezing.   Cardiovascular: Negative for chest pain and leg swelling.  Gastrointestinal: Negative for abdominal pain, constipation, diarrhea, nausea and vomiting.  Genitourinary: Negative for bladder incontinence, difficulty urinating, dysuria, frequency and hematuria.   Musculoskeletal: Negative for back pain, gait problem, neck pain and neck stiffness.  Skin: Negative for itching and rash.  Neurological: Negative for dizziness, extremity weakness, gait problem, headaches, light-headedness and seizures.  Hematological: Negative for adenopathy. Does not bruise/bleed easily.  Psychiatric/Behavioral: Negative for confusion, depression and sleep disturbance. The patient is not nervous/anxious.      PHYSICAL EXAMINATION:  Blood pressure 130/77, pulse 89, temperature (!) 97.5 F (36.4 C), temperature source Oral, resp. rate 18, height 6\' 3"  (1.905 m), weight 136 lb 9.6 oz (62 kg), SpO2 92 %.  ECOG PERFORMANCE STATUS: 1 - Symptomatic but completely ambulatory  Physical Exam  Constitutional: Oriented to person, place, and time and well-developed, well-nourished, and in no distress. No distress.  HENT:  Head: Normocephalic and atraumatic.  Mouth/Throat: Oropharynx is clear and moist. No oropharyngeal exudate.  Eyes: Conjunctivae are normal. Right eye exhibits no discharge. Left eye exhibits no discharge. No scleral icterus.  Neck: Normal range of motion. Neck supple.  Cardiovascular: Normal rate, regular rhythm, normal heart sounds and intact distal pulses.   Pulmonary/Chest: Effort normal and breath sounds normal. No respiratory distress. No wheezes. No rales.  Abdominal: Soft. Bowel sounds are normal. Exhibits no distension and no mass. There is no tenderness.  Musculoskeletal: Normal range of motion. Exhibits no edema.  Lymphadenopathy:    No cervical adenopathy.  Neurological: Alert and oriented to person, place, and time. Exhibits normal muscle tone. Gait normal. Coordination normal.  Skin: Skin is warm and dry. No rash noted. Not diaphoretic. No erythema. No pallor.  Psychiatric: Mood, memory and judgment normal.  Vitals reviewed.  LABORATORY DATA: Lab Results  Component Value Date   WBC 5.5 03/20/2017   HGB 12.2 (L) 03/20/2017   HCT 38.2 (L) 03/20/2017   MCV 92.0 03/20/2017   PLT 221 03/20/2017      Chemistry      Component Value Date/Time   NA 141 03/20/2017 1028   NA 139 03/13/2017 1059   K 3.6 03/20/2017 1028   K 3.9 03/13/2017 1059   CL 106 03/20/2017 1028   CO2 27 03/20/2017 1028   CO2 26 03/13/2017 1059   BUN 18 03/20/2017 1028   BUN 18.6 03/13/2017 1059   CREATININE 1.22 03/20/2017 1028   CREATININE 1.2 03/13/2017 1059      Component Value  Date/Time   CALCIUM 9.4 03/20/2017 1028   CALCIUM 8.9 03/13/2017 1059   ALKPHOS 94 03/20/2017 1028   ALKPHOS 118 03/13/2017 1059   AST 18 03/20/2017 1028   AST 16 03/13/2017 1059   ALT 11 03/20/2017 1028   ALT 14 03/13/2017 1059   BILITOT 0.4 03/20/2017 1028   BILITOT 0.36 03/13/2017 1059       RADIOGRAPHIC STUDIES:  Ct Abdomen Pelvis W Contrast  Result Date: 03/17/2017 CLINICAL DATA:  Right abdominal pain, nausea/vomiting EXAM: CT ABDOMEN AND PELVIS WITH CONTRAST TECHNIQUE: Multidetector CT imaging of the abdomen and pelvis was performed using the standard protocol following bolus administration of intravenous contrast. CONTRAST:  100 mL Isovue 300 IV COMPARISON:  PET-CT dated 04/25/2016 FINDINGS: Lower chest: Emphysematous changes at the lung bases. Hepatobiliary: Liver is within normal limits. Gallbladder is unremarkable. No intrahepatic extrahepatic duct dilatation. Pancreas: Within normal limits. Spleen: Within normal limits. Adrenals/Urinary Tract: Adrenal glands are within normal limits. 11 mm cyst along the posterior interpolar right kidney (series 7/ image 18). Left kidney is within normal limits. No hydronephrosis. Distended bladder. Stomach/Bowel: Stomach is mildly distended. No evidence of bowel obstruction. Normal appendix (series 2/ image 56). Vascular/Lymphatic: No evidence of abdominal aortic aneurysm. Aorto bi-iliac stents. No suspicious abdominopelvic lymphadenopathy. Reproductive: Prostate is grossly unremarkable. Other: No abdominopelvic ascites. Musculoskeletal: Mild degenerative changes of the lower thoracic spine. IMPRESSION: No CT findings to account for the patient's abdominal pain. No evidence of bowel obstruction.  Normal appendix. Ancillary findings as above. Electronically Signed   By: Julian Hy M.D.   On: 03/17/2017 10:24     ASSESSMENT/PLAN:  Squamous cell lung cancer, left [facility]) This is a very pleasant 67 year old African-American male with unresectable  a stage IIIa non-small cell lung cancer, status post a course of concurrent chemoradiation with weekly carboplatin and paclitaxel and tolerated the treatment well. His scan showed no evidence for disease progression and the patient was started on consolidation treatment with immunotherapy with Imfinzi (Durvalumab) status post 2 cycles.This treatment was discontinued secondary to a significant skin rash treated with steroids with recurrence after the patient was resumed again. He has been on observation for the last few months. Repeat CT scan of the chest showed improvement of the primary apical left upper lobe cavitary mass but there was development of new 2.0 cm solid pulmonary nodules at the medial central margin of the primary mass in addition to another new 0.5 cm left upper lobe pulmonary nodule. These findings are suspicious for metastatic disease. The patient is now on palliative systemic chemotherapy with carboplatin for AUC of 5 and paclitaxel 175 mg/M2 every 3 weeks.  He tolerated the first cycle well overall.  He will proceed with cycle 2 as scheduled today.  He  will continue to have weekly labs.  He will have a follow-up visit in 3 weeks for evaluation prior to cycle 3 of his chemotherapy.    The patient was advised to call immediately if he has any concerning symptoms in the interval. The patient voices understanding of current disease status and treatment options and is in agreement with the current care plan. All questions were answered. The patient knows to call the clinic with any problems, questions or concerns. We can certainly see the patient much sooner if necessary.  No orders of the defined types were placed in this encounter.   Mikey Bussing, DNP, AGPCNP-BC, AOCNP 03/21/17

## 2017-03-20 NOTE — Patient Instructions (Signed)
   [facility] Discharge Instructions for Patients Receiving Chemotherapy  Today you received the following chemotherapy agents Taxol and Carboplatin   To help prevent nausea and vomiting after your treatment, we encourage you to take your nausea medication as directed.    If you develop nausea and vomiting that is not controlled by your nausea medication, call the clinic.   BELOW ARE SYMPTOMS THAT SHOULD BE REPORTED IMMEDIATELY:  *FEVER GREATER THAN 100.5 F  *CHILLS WITH OR WITHOUT FEVER  NAUSEA AND VOMITING THAT IS NOT CONTROLLED WITH YOUR NAUSEA MEDICATION  *UNUSUAL SHORTNESS OF BREATH  *UNUSUAL BRUISING OR BLEEDING  TENDERNESS IN MOUTH AND THROAT WITH OR WITHOUT PRESENCE OF ULCERS  *URINARY PROBLEMS  *BOWEL PROBLEMS  UNUSUAL RASH Items with * indicate a potential emergency and should be followed up as soon as possible.  Feel free to call the clinic should you have any questions or concerns. The clinic phone number is [phone].  Please show the CHEMO ALERT CARD at check-in to the Emergency Department and triage nurse.   

## 2017-03-21 NOTE — Assessment & Plan Note (Signed)
This is a very pleasant 67 year old African-American male with unresectable a stage IIIa non-small cell lung cancer, status post a course of concurrent chemoradiation with weekly carboplatin and paclitaxel and tolerated the treatment well. His scan showed no evidence for disease progression and the patient was started on consolidation treatment with immunotherapy with Imfinzi (Durvalumab) status post 2 cycles.This treatment was discontinued secondary to a significant skin rash treated with steroids with recurrence after the patient was resumed again. He has been on observation for the last few months. Repeat CT scan of the chest showed improvement of the primary apical left upper lobe cavitary mass but there was development of new 2.0 cm solid pulmonary nodules at the medial central margin of the primary mass in addition to another new 0.5 cm left upper lobe pulmonary nodule. These findings are suspicious for metastatic disease. The patient is now on palliative systemic chemotherapy with carboplatin for AUC of 5 and paclitaxel 175 mg/M2 every 3 weeks.  He tolerated the first cycle well overall.  He will proceed with cycle 2 as scheduled today.  He will continue to have weekly labs.  He will have a follow-up visit in 3 weeks for evaluation prior to cycle 3 of his chemotherapy.    The patient was advised to call immediately if he has any concerning symptoms in the interval. The patient voices understanding of current disease status and treatment options and is in agreement with the current care plan. All questions were answered. The patient knows to call the clinic with any problems, questions or concerns. We can certainly see the patient much sooner if necessary.

## 2017-03-27 ENCOUNTER — Inpatient Hospital Stay: Payer: Medicare Other

## 2017-03-27 DIAGNOSIS — Z5189 Encounter for other specified aftercare: Secondary | ICD-10-CM | POA: Diagnosis not present

## 2017-03-27 DIAGNOSIS — Z5111 Encounter for antineoplastic chemotherapy: Secondary | ICD-10-CM | POA: Diagnosis not present

## 2017-03-27 DIAGNOSIS — C3492 Malignant neoplasm of unspecified part of left bronchus or lung: Secondary | ICD-10-CM

## 2017-03-27 DIAGNOSIS — Z9221 Personal history of antineoplastic chemotherapy: Secondary | ICD-10-CM | POA: Diagnosis not present

## 2017-03-27 DIAGNOSIS — C3412 Malignant neoplasm of upper lobe, left bronchus or lung: Secondary | ICD-10-CM | POA: Diagnosis not present

## 2017-03-27 DIAGNOSIS — Z923 Personal history of irradiation: Secondary | ICD-10-CM | POA: Diagnosis not present

## 2017-03-27 LAB — COMPREHENSIVE METABOLIC PANEL
ALBUMIN: 3.6 g/dL (ref 3.5–5.0)
ALK PHOS: 157 U/L — AB (ref 40–150)
ALT: 20 U/L (ref 0–55)
AST: 20 U/L (ref 5–34)
Anion gap: 9 (ref 3–11)
BILIRUBIN TOTAL: 0.2 mg/dL (ref 0.2–1.2)
BUN: 18 mg/dL (ref 7–26)
CO2: 26 mmol/L (ref 22–29)
Calcium: 9.5 mg/dL (ref 8.4–10.4)
Chloride: 105 mmol/L (ref 98–109)
Creatinine, Ser: 1.39 mg/dL — ABNORMAL HIGH (ref 0.70–1.30)
GFR calc Af Amer: 59 mL/min — ABNORMAL LOW (ref >60)
GFR calc non Af Amer: 51 mL/min — ABNORMAL LOW (ref >60)
GLUCOSE: 89 mg/dL (ref 70–140)
POTASSIUM: 4.5 mmol/L (ref 3.5–5.1)
Sodium: 140 mmol/L (ref 136–145)
TOTAL PROTEIN: 7.7 g/dL (ref 6.4–8.3)

## 2017-03-27 LAB — CBC WITH DIFFERENTIAL/PLATELET
BASOS ABS: 0.1 10*3/uL (ref 0.0–0.1)
Basophils Relative: 0 %
EOS ABS: 0.9 10*3/uL — AB (ref 0.0–0.5)
EOS PCT: 4 %
HCT: 40 % (ref 38.4–49.9)
Hemoglobin: 12.8 g/dL — ABNORMAL LOW (ref 13.0–17.1)
LYMPHS PCT: 6 %
Lymphs Abs: 1.3 10*3/uL (ref 0.9–3.3)
MCH: 29.8 pg (ref 27.2–33.4)
MCHC: 32 g/dL (ref 32.0–36.0)
MCV: 93.2 fL (ref 79.3–98.0)
MONO ABS: 1.8 10*3/uL — AB (ref 0.1–0.9)
Monocytes Relative: 8 %
Neutro Abs: 17.3 10*3/uL — ABNORMAL HIGH (ref 1.5–6.5)
Neutrophils Relative %: 82 %
PLATELETS: 252 10*3/uL (ref 140–400)
RBC: 4.29 MIL/uL (ref 4.20–5.82)
RDW: 15.7 % — ABNORMAL HIGH (ref 11.0–15.6)
WBC: 21.3 10*3/uL — ABNORMAL HIGH (ref 4.0–10.3)

## 2017-04-01 ENCOUNTER — Ambulatory Visit: Payer: Medicare Other | Admitting: Oncology

## 2017-04-01 ENCOUNTER — Other Ambulatory Visit: Payer: Medicare Other

## 2017-04-01 ENCOUNTER — Ambulatory Visit: Payer: Medicare Other

## 2017-04-03 ENCOUNTER — Inpatient Hospital Stay: Payer: Medicare Other

## 2017-04-03 DIAGNOSIS — Z5189 Encounter for other specified aftercare: Secondary | ICD-10-CM | POA: Diagnosis not present

## 2017-04-03 DIAGNOSIS — Z9221 Personal history of antineoplastic chemotherapy: Secondary | ICD-10-CM | POA: Diagnosis not present

## 2017-04-03 DIAGNOSIS — I10 Essential (primary) hypertension: Secondary | ICD-10-CM

## 2017-04-03 DIAGNOSIS — L270 Generalized skin eruption due to drugs and medicaments taken internally: Secondary | ICD-10-CM

## 2017-04-03 DIAGNOSIS — C3412 Malignant neoplasm of upper lobe, left bronchus or lung: Secondary | ICD-10-CM | POA: Diagnosis not present

## 2017-04-03 DIAGNOSIS — Z5111 Encounter for antineoplastic chemotherapy: Secondary | ICD-10-CM | POA: Diagnosis not present

## 2017-04-03 DIAGNOSIS — C3492 Malignant neoplasm of unspecified part of left bronchus or lung: Secondary | ICD-10-CM

## 2017-04-03 DIAGNOSIS — Z923 Personal history of irradiation: Secondary | ICD-10-CM | POA: Diagnosis not present

## 2017-04-03 LAB — CBC WITH DIFFERENTIAL/PLATELET
BASOS ABS: 0 10*3/uL (ref 0.0–0.1)
Basophils Relative: 0 %
Eosinophils Absolute: 0.1 10*3/uL (ref 0.0–0.5)
Eosinophils Relative: 1 %
HCT: 36.4 % — ABNORMAL LOW (ref 38.4–49.9)
HEMOGLOBIN: 11.7 g/dL — AB (ref 13.0–17.1)
LYMPHS ABS: 1 10*3/uL (ref 0.9–3.3)
LYMPHS PCT: 13 %
MCH: 29.8 pg (ref 27.2–33.4)
MCHC: 32.1 g/dL (ref 32.0–36.0)
MCV: 92.6 fL (ref 79.3–98.0)
Monocytes Absolute: 0.5 10*3/uL (ref 0.1–0.9)
Monocytes Relative: 6 %
NEUTROS PCT: 80 %
Neutro Abs: 6.1 10*3/uL (ref 1.5–6.5)
PLATELETS: 193 10*3/uL (ref 140–400)
RBC: 3.93 MIL/uL — AB (ref 4.20–5.82)
RDW: 15.8 % — ABNORMAL HIGH (ref 11.0–15.6)
WBC: 7.7 10*3/uL (ref 4.0–10.3)

## 2017-04-03 LAB — COMPREHENSIVE METABOLIC PANEL
ALK PHOS: 111 U/L (ref 40–150)
ALT: 13 U/L (ref 0–55)
AST: 15 U/L (ref 5–34)
Albumin: 3.4 g/dL — ABNORMAL LOW (ref 3.5–5.0)
Anion gap: 8 (ref 3–11)
BILIRUBIN TOTAL: 0.3 mg/dL (ref 0.2–1.2)
BUN: 22 mg/dL (ref 7–26)
CALCIUM: 9.7 mg/dL (ref 8.4–10.4)
CO2: 26 mmol/L (ref 22–29)
CREATININE: 1.17 mg/dL (ref 0.70–1.30)
Chloride: 107 mmol/L (ref 98–109)
GFR calc Af Amer: >60 mL/min (ref >60)
Glucose, Bld: 78 mg/dL (ref 70–140)
Potassium: 4 mmol/L (ref 3.5–5.1)
Sodium: 141 mmol/L (ref 136–145)
TOTAL PROTEIN: 7.4 g/dL (ref 6.4–8.3)

## 2017-04-10 ENCOUNTER — Inpatient Hospital Stay: Payer: Medicare Other

## 2017-04-10 ENCOUNTER — Encounter: Payer: Self-pay | Admitting: Oncology

## 2017-04-10 ENCOUNTER — Inpatient Hospital Stay (HOSPITAL_BASED_OUTPATIENT_CLINIC_OR_DEPARTMENT_OTHER): Payer: Medicare Other | Admitting: Oncology

## 2017-04-10 VITALS — BP 123/86 | HR 74 | Temp 97.7°F | Resp 18 | Ht 75.0 in | Wt 133.8 lb

## 2017-04-10 DIAGNOSIS — Z5111 Encounter for antineoplastic chemotherapy: Secondary | ICD-10-CM

## 2017-04-10 DIAGNOSIS — C3412 Malignant neoplasm of upper lobe, left bronchus or lung: Secondary | ICD-10-CM

## 2017-04-10 DIAGNOSIS — Z5189 Encounter for other specified aftercare: Secondary | ICD-10-CM

## 2017-04-10 DIAGNOSIS — C3492 Malignant neoplasm of unspecified part of left bronchus or lung: Secondary | ICD-10-CM

## 2017-04-10 DIAGNOSIS — Z9221 Personal history of antineoplastic chemotherapy: Secondary | ICD-10-CM | POA: Diagnosis not present

## 2017-04-10 DIAGNOSIS — Z923 Personal history of irradiation: Secondary | ICD-10-CM | POA: Diagnosis not present

## 2017-04-10 LAB — CBC WITH DIFFERENTIAL/PLATELET
Basophils Absolute: 0 10*3/uL (ref 0.0–0.1)
Basophils Relative: 1 %
EOS ABS: 0.1 10*3/uL (ref 0.0–0.5)
EOS PCT: 2 %
HCT: 36.2 % — ABNORMAL LOW (ref 38.4–49.9)
Hemoglobin: 11.6 g/dL — ABNORMAL LOW (ref 13.0–17.1)
LYMPHS ABS: 0.9 10*3/uL (ref 0.9–3.3)
LYMPHS PCT: 15 %
MCH: 29.8 pg (ref 27.2–33.4)
MCHC: 32 g/dL (ref 32.0–36.0)
MCV: 93.1 fL (ref 79.3–98.0)
MONO ABS: 0.6 10*3/uL (ref 0.1–0.9)
MONOS PCT: 10 %
Neutro Abs: 4.2 10*3/uL (ref 1.5–6.5)
Neutrophils Relative %: 72 %
PLATELETS: 225 10*3/uL (ref 140–400)
RBC: 3.89 MIL/uL — ABNORMAL LOW (ref 4.20–5.82)
RDW: 16.1 % — ABNORMAL HIGH (ref 11.0–14.6)
WBC: 5.8 10*3/uL (ref 4.0–10.3)

## 2017-04-10 LAB — COMPREHENSIVE METABOLIC PANEL
ALBUMIN: 3.6 g/dL (ref 3.5–5.0)
ALT: 11 U/L (ref 0–55)
AST: 20 U/L (ref 5–34)
Alkaline Phosphatase: 99 U/L (ref 40–150)
Anion gap: 10 (ref 3–11)
BUN: 25 mg/dL (ref 7–26)
CHLORIDE: 106 mmol/L (ref 98–109)
CO2: 24 mmol/L (ref 22–29)
CREATININE: 1.32 mg/dL — AB (ref 0.70–1.30)
Calcium: 9.5 mg/dL (ref 8.4–10.4)
GFR calc Af Amer: >60 mL/min (ref >60)
GFR calc non Af Amer: 54 mL/min — ABNORMAL LOW (ref >60)
GLUCOSE: 92 mg/dL (ref 70–140)
POTASSIUM: 4.4 mmol/L (ref 3.5–5.1)
Sodium: 140 mmol/L (ref 136–145)
Total Bilirubin: 0.3 mg/dL (ref 0.2–1.2)
Total Protein: 7.5 g/dL (ref 6.4–8.3)

## 2017-04-10 MED ORDER — SODIUM CHLORIDE 0.9 % IV SOLN
20.0000 mg | Freq: Once | INTRAVENOUS | Status: AC
  Administered 2017-04-10: 20 mg via INTRAVENOUS
  Filled 2017-04-10: qty 2

## 2017-04-10 MED ORDER — SODIUM CHLORIDE 0.9 % IV SOLN
175.0000 mg/m2 | Freq: Once | INTRAVENOUS | Status: AC
  Administered 2017-04-10: 318 mg via INTRAVENOUS
  Filled 2017-04-10: qty 53

## 2017-04-10 MED ORDER — SODIUM CHLORIDE 0.9 % IV SOLN
Freq: Once | INTRAVENOUS | Status: AC
  Administered 2017-04-10: 12:00:00 via INTRAVENOUS

## 2017-04-10 MED ORDER — DIPHENHYDRAMINE HCL 50 MG/ML IJ SOLN
50.0000 mg | Freq: Once | INTRAMUSCULAR | Status: AC
  Administered 2017-04-10: 50 mg via INTRAVENOUS

## 2017-04-10 MED ORDER — SODIUM CHLORIDE 0.9 % IV SOLN
388.0000 mg | Freq: Once | INTRAVENOUS | Status: AC
  Administered 2017-04-10: 390 mg via INTRAVENOUS
  Filled 2017-04-10: qty 39

## 2017-04-10 MED ORDER — PALONOSETRON HCL INJECTION 0.25 MG/5ML
0.2500 mg | Freq: Once | INTRAVENOUS | Status: AC
  Administered 2017-04-10: 0.25 mg via INTRAVENOUS

## 2017-04-10 MED ORDER — FAMOTIDINE IN NACL 20-0.9 MG/50ML-% IV SOLN
20.0000 mg | Freq: Once | INTRAVENOUS | Status: AC
  Administered 2017-04-10: 20 mg via INTRAVENOUS

## 2017-04-10 MED ORDER — PEGFILGRASTIM 6 MG/0.6ML ~~LOC~~ PSKT
6.0000 mg | PREFILLED_SYRINGE | Freq: Once | SUBCUTANEOUS | Status: AC
  Administered 2017-04-10: 6 mg via SUBCUTANEOUS

## 2017-04-10 NOTE — Patient Instructions (Signed)
   [facility] Discharge Instructions for Patients Receiving Chemotherapy  Today you received the following chemotherapy agents Taxol and Carboplatin   To help prevent nausea and vomiting after your treatment, we encourage you to take your nausea medication as directed.    If you develop nausea and vomiting that is not controlled by your nausea medication, call the clinic.   BELOW ARE SYMPTOMS THAT SHOULD BE REPORTED IMMEDIATELY:  *FEVER GREATER THAN 100.5 F  *CHILLS WITH OR WITHOUT FEVER  NAUSEA AND VOMITING THAT IS NOT CONTROLLED WITH YOUR NAUSEA MEDICATION  *UNUSUAL SHORTNESS OF BREATH  *UNUSUAL BRUISING OR BLEEDING  TENDERNESS IN MOUTH AND THROAT WITH OR WITHOUT PRESENCE OF ULCERS  *URINARY PROBLEMS  *BOWEL PROBLEMS  UNUSUAL RASH Items with * indicate a potential emergency and should be followed up as soon as possible.  Feel free to call the clinic should you have any questions or concerns. The clinic phone number is [phone].  Please show the CHEMO ALERT CARD at check-in to the Emergency Department and triage nurse.   

## 2017-04-10 NOTE — Progress Notes (Signed)
[name] OFFICE PROGRESS NOTE  [name], MD [place] [address]  DIAGNOSIS: Stage IIIa (T2b, N2, M0) non-small cell lung cancer, squamous cell carcinoma presented with left upper lobe lung mass in addition to AP window lymphadenopathy diagnosed in February 2018.  PRIOR THERAPY: 1)Concurrent chemoradiation therapy. He received radiation for approximately 2-3 weeks prior to starting his chemotherapy. He completed his radiation on 07/05/2016. He received 2 doses of weekly carboplatin for an AUC of 2 and paclitaxel 45 mg meter squared which was last given on 07/03/2016. 2)consolidation treatment with immunotherapy with Imfinzi (Durvalumab) 10 MG/KG every 2 weeks. First dose 08/22/2016. Status post 2 cycles.Last dose was given September 19, 2016 discontinued secondary to intolerance and significant skin rash.  CURRENT THERAPY: Systemic chemotherapy with carboplatin for AC of 5 and paclitaxel 175 mg/M2 every 3 weeks. First dose12/20/2018.  Status post 2 cycles.  INTERVAL HISTORY: [name] 67 y.o. male returns for routine follow-up visit by himself.  The patient is feeling fine today has no specific complaints.  The patient denies fevers and chills.  Denies chest pain, shortness breath, cough, hemoptysis.  Denies nausea, vomiting, constipation, diarrhea.  The patient denies any significant weight loss or night sweats.  The patient is here for evaluation prior to cycle #3 of his chemotherapy.  MEDICAL HISTORY: Past Medical History:  Diagnosis Date  . AAA (abdominal aortic aneurysm) ([place])   . Abdominal aortic aneurysm ([place])    AAA And bilateral common iliac artery aneurysms  . BPH (benign prostatic hyperplasia)   . CAD (coronary artery disease)    Dr Ron Parker @ [facility]   . Chronic mental illness 04/21/2011   Diagnosis unclear,  Family provides good care  . COPD (chronic obstructive pulmonary disease) ([place]) 04/21/2011   smoking  . Drug-induced skin  rash 09/05/2016  . Ejection fraction    EF 35-40%, echo, May, 2010  . Hyperlipidemia   . Hyperplastic colon polyp 04/21/2011  . Hypertension   . Preop cardiovascular exam    Cardiac clearance for major vascular surgery, May, 2013  . Pulmonary nodule    Chest CT May, 2013, 2 small pulmonary nodules, needs appropriate followup,  this CT was not ordered by the cardiology team  . PVD (peripheral vascular disease) ([place]) 04/21/2011  . Retention of urine   . Shortness of breath   . Squamous cell lung cancer, left ([place]) 06/13/2016  . Tobacco abuse     ALLERGIES:  is allergic to ace inhibitors.  MEDICATIONS:  Current Outpatient Medications  Medication Sig Dispense Refill  . aspirin EC 81 MG tablet Take 81 mg by mouth daily.    . ondansetron (ZOFRAN ODT) 4 MG disintegrating tablet Take 1 tablet (4 mg total) by mouth every 8 (eight) hours as needed for nausea or vomiting. 10 tablet 0  . prochlorperazine (COMPAZINE) 10 MG tablet Take 1 tablet (10 mg total) by mouth every 6 (six) hours as needed for nausea or vomiting. 30 tablet 0  . psyllium (METAMUCIL) 58.6 % packet Take 1 packet by mouth daily. 30 each 12  . rosuvastatin (CRESTOR) 40 MG tablet TAKE 1 TABLET BY MOUTH EVERY DAY 90 tablet 0  . Sennosides (SENOKOT PO) Take 1 tablet by mouth daily as needed (CONSTIPATION).    [name] sucralfate (CARAFATE) 1 GM/10ML suspension Take 10 mLs (1 g total) by mouth 4 (four) times daily -  with meals and at bedtime. 420 mL 0   No current facility-administered  medications for this visit.    Facility-Administered Medications Ordered in Other Visits  Medication Dose Route Frequency Provider Last Rate Last Dose  . 0.9 %  sodium chloride infusion   Intravenous Once Curt Bears, MD      . CARBOplatin (PARAPLATIN) 390 mg in sodium chloride 0.9 % 250 mL chemo infusion  390 mg Intravenous Once Curt Bears, MD      . PACLitaxel (TAXOL) 318 mg in sodium chloride 0.9 % 500 mL chemo infusion (> '80mg'$ /m2)  175 mg/m2  (Treatment Plan Recorded) Intravenous Once Curt Bears, MD 184 mL/hr at 04/10/17 1326 318 mg at 04/10/17 1326  . pegfilgrastim (NEULASTA ONPRO KIT) injection 6 mg  6 mg Subcutaneous Once Curt Bears, MD        SURGICAL HISTORY:  Past Surgical History:  Procedure Laterality Date  . ABDOMINAL AORTA STENT  07/2011  . ABDOMINAL AORTAGRAM N/A 07/26/2011   Procedure: ABDOMINAL Maxcine Ham;  Surgeon: Conrad Birney, MD;  Location: [facility] CATH LAB;  Service: Cardiovascular;  Laterality: N/A;  . ABDOMINAL AORTIC ANEURYSM REPAIR  07/2011  . CORONARY ARTERY BYPASS GRAFT  07/2008   CABG X5/notes 07/12/2010  . EMBOLIZATION Left 07/26/2011   Procedure: EMBOLIZATION;  Surgeon: Conrad Dwight, MD;  Location: [facility] CATH LAB;  Service: Cardiovascular;  Laterality: Left;  . TRANSURETHRAL RESECTION OF PROSTATE  11/29/2011   Procedure: TRANSURETHRAL RESECTION OF THE PROSTATE WITH GYRUS INSTRUMENTS;  Surgeon: Malka So, MD;  Location: [facility] ORS;  Service: Urology;  Laterality: N/A;  Prostate Ultrasound, and Biopsy  . VIDEO BRONCHOSCOPY Bilateral 04/12/2016   Procedure: VIDEO BRONCHOSCOPY WITH FLUORO;  Surgeon: Juanito Doom, MD;  Location: [facility];  Service: Cardiopulmonary;  Laterality: Bilateral;    REVIEW OF SYSTEMS:   Review of Systems  Constitutional: Negative for appetite change, chills, fatigue, fever and unexpected weight change.  HENT:   Negative for mouth sores, nosebleeds, sore throat and trouble swallowing.   Eyes: Negative for eye problems and icterus.  Respiratory: Negative for cough, hemoptysis, shortness of breath and wheezing.   Cardiovascular: Negative for chest pain and leg swelling.  Gastrointestinal: Negative for abdominal pain, constipation, diarrhea, nausea and vomiting.  Genitourinary: Negative for bladder incontinence, difficulty urinating, dysuria, frequency and hematuria.   Musculoskeletal: Negative for back pain, gait problem, neck pain and neck stiffness.  Skin: Negative for  itching and rash.  Neurological: Negative for dizziness, extremity weakness, gait problem, headaches, light-headedness and seizures.  Hematological: Negative for adenopathy. Does not bruise/bleed easily.  Psychiatric/Behavioral: Negative for confusion, depression and sleep disturbance. The patient is not nervous/anxious.     PHYSICAL EXAMINATION:  Blood pressure 123/86, pulse 74, temperature 97.7 F (36.5 C), temperature source Oral, resp. rate 18, height '6\' 3"'$  (1.905 m), weight 133 lb 12.8 oz (60.7 kg), SpO2 100 %.  ECOG PERFORMANCE STATUS: 1 - Symptomatic but completely ambulatory  Physical Exam  Constitutional: Oriented to person, place, and time and well-developed, well-nourished, and in no distress. No distress.  HENT:  Head: Normocephalic and atraumatic.  Mouth/Throat: Oropharynx is clear and moist. No oropharyngeal exudate.  Eyes: Conjunctivae are normal. Right eye exhibits no discharge. Left eye exhibits no discharge. No scleral icterus.  Neck: Normal range of motion. Neck supple.  Cardiovascular: Normal rate, regular rhythm, normal heart sounds and intact distal pulses.   Pulmonary/Chest: Effort normal and breath sounds normal. No respiratory distress. No wheezes. No rales.  Abdominal: Soft. Bowel sounds are normal. Exhibits no distension and no mass. There is no tenderness.  Musculoskeletal: Normal range of motion. Exhibits no edema.  Lymphadenopathy:    No cervical adenopathy.  Neurological: Alert and oriented to person, place, and time. Exhibits normal muscle tone. Gait normal. Coordination normal.  Skin: Skin is warm and dry. No rash noted. Not diaphoretic. No erythema. No pallor.  Psychiatric: Mood, memory and judgment normal.  Vitals reviewed.  LABORATORY DATA: Lab Results  Component Value Date   WBC 5.8 04/10/2017   HGB 11.6 (L) 04/10/2017   HCT 36.2 (L) 04/10/2017   MCV 93.1 04/10/2017   PLT 225 04/10/2017      Chemistry      Component Value Date/Time   NA  140 04/10/2017 0937   NA 139 03/13/2017 1059   K 4.4 04/10/2017 0937   K 3.9 03/13/2017 1059   CL 106 04/10/2017 0937   CO2 24 04/10/2017 0937   CO2 26 03/13/2017 1059   BUN 25 04/10/2017 0937   BUN 18.6 03/13/2017 1059   CREATININE 1.32 (H) 04/10/2017 0937   CREATININE 1.2 03/13/2017 1059      Component Value Date/Time   CALCIUM 9.5 04/10/2017 0937   CALCIUM 8.9 03/13/2017 1059   ALKPHOS 99 04/10/2017 0937   ALKPHOS 118 03/13/2017 1059   AST 20 04/10/2017 0937   AST 16 03/13/2017 1059   ALT 11 04/10/2017 0937   ALT 14 03/13/2017 1059   BILITOT 0.3 04/10/2017 0937   BILITOT 0.36 03/13/2017 1059       RADIOGRAPHIC STUDIES:  Ct Abdomen Pelvis W Contrast  Result Date: 03/17/2017 CLINICAL DATA:  Right abdominal pain, nausea/vomiting EXAM: CT ABDOMEN AND PELVIS WITH CONTRAST TECHNIQUE: Multidetector CT imaging of the abdomen and pelvis was performed using the standard protocol following bolus administration of intravenous contrast. CONTRAST:  100 mL Isovue 300 IV COMPARISON:  PET-CT dated 04/25/2016 FINDINGS: Lower chest: Emphysematous changes at the lung bases. Hepatobiliary: Liver is within normal limits. Gallbladder is unremarkable. No intrahepatic extrahepatic duct dilatation. Pancreas: Within normal limits. Spleen: Within normal limits. Adrenals/Urinary Tract: Adrenal glands are within normal limits. 11 mm cyst along the posterior interpolar right kidney (series 7/ image 18). Left kidney is within normal limits. No hydronephrosis. Distended bladder. Stomach/Bowel: Stomach is mildly distended. No evidence of bowel obstruction. Normal appendix (series 2/ image 56). Vascular/Lymphatic: No evidence of abdominal aortic aneurysm. Aorto bi-iliac stents. No suspicious abdominopelvic lymphadenopathy. Reproductive: Prostate is grossly unremarkable. Other: No abdominopelvic ascites. Musculoskeletal: Mild degenerative changes of the lower thoracic spine. IMPRESSION: No CT findings to account for  the patient's abdominal pain. No evidence of bowel obstruction.  Normal appendix. Ancillary findings as above. Electronically Signed   By: Julian Hy M.D.   On: 03/17/2017 10:24     ASSESSMENT/PLAN:  Squamous cell lung cancer, left [facility]) This is a very pleasant 67 year old African-American male with unresectable a stage IIIa non-small cell lung cancer, status post a course of concurrent chemoradiation with weekly carboplatin and paclitaxel and tolerated the treatment well. His scan showed no evidence for disease progression and the patient was started on consolidation treatment with immunotherapy with Imfinzi (Durvalumab) status post 2 cycles.This treatment was discontinued secondary to a significant skin rash treated with steroids with recurrence after the patient was resumed again. He has been on observation for the last few months. Repeat CT scan of the chest showed improvement of the primary apical left upper lobe cavitary mass but there was development of new 2.0 cm solid pulmonary nodules at the medial central margin of the primary mass in  addition to another new 0.5 cm left upper lobe pulmonary nodule. These findings are suspicious for metastatic disease. The patient is now on palliative systemic chemotherapy with carboplatin for AUC of 5 and paclitaxel 175 mg/M2 every 3 weeks.  He  is tolerating his chemotherapy well overall.  Recommend that he proceed with cycle 3 of his treatment today as scheduled. He will continue to have weekly labs.  The patient will have a restaging CT scan of the chest prior to his next visit. He will have a follow-up visit in 3 weeks for evaluation prior to cycle 4 of his chemotherapy and to review his restaging CT scan results.    The patient was advised to call immediately if he has any concerning symptoms in the interval. The patient voices understanding of current disease status and treatment options and is in agreement with the current care plan. All  questions were answered. The patient knows to call the clinic with any problems, questions or concerns. We can certainly see the patient much sooner if necessary.  Orders Placed This Encounter  Procedures  . CT CHEST W CONTRAST    Standing Status:   Future    Standing Expiration Date:   04/10/2018    Order Specific Question:   If indicated for the ordered procedure, I authorize the administration of contrast media per Radiology protocol    Answer:   Yes    Order Specific Question:   Preferred imaging location?    Answer:   [facility]    Order Specific Question:   Radiology Contrast Protocol - do NOT remove file path    Answer:   [email or web address]    Order Specific Question:   Reason for Exam additional comments    Answer:   lung cancer. Restaging.    Mikey Bussing, DNP, AGPCNP-BC, AOCNP 04/10/17

## 2017-04-10 NOTE — Assessment & Plan Note (Signed)
This is a very pleasant 67 year old African-American male with unresectable a stage IIIa non-small cell lung cancer, status post a course of concurrent chemoradiation with weekly carboplatin and paclitaxel and tolerated the treatment well. His scan showed no evidence for disease progression and the patient was started on consolidation treatment with immunotherapy with Imfinzi (Durvalumab) status post 2 cycles.This treatment was discontinued secondary to a significant skin rash treated with steroids with recurrence after the patient was resumed again. He has been on observation for the last few months. Repeat CT scan of the chest showed improvement of the primary apical left upper lobe cavitary mass but there was development of new 2.0 cm solid pulmonary nodules at the medial central margin of the primary mass in addition to another new 0.5 cm left upper lobe pulmonary nodule. These findings are suspicious for metastatic disease. The patient is now on palliative systemic chemotherapy with carboplatin for AUC of 5 and paclitaxel 175 mg/M2 every 3 weeks.  He is tolerating his chemotherapy well overall.  Recommend that he proceed with cycle 3 of his treatment today as scheduled. He will continue to have weekly labs.  The patient will have a restaging CT scan of the chest prior to his next visit. He will have a follow-up visit in 3 weeks for evaluation prior to cycle 4 of his chemotherapy and to review his restaging CT scan results.    The patient was advised to call immediately if he has any concerning symptoms in the interval. The patient voices understanding of current disease status and treatment options and is in agreement with the current care plan. All questions were answered. The patient knows to call the clinic with any problems, questions or concerns. We can certainly see the patient much sooner if necessary.

## 2017-04-17 ENCOUNTER — Inpatient Hospital Stay: Payer: Medicare Other | Attending: Internal Medicine

## 2017-04-17 DIAGNOSIS — C3492 Malignant neoplasm of unspecified part of left bronchus or lung: Secondary | ICD-10-CM

## 2017-04-17 DIAGNOSIS — Z7689 Persons encountering health services in other specified circumstances: Secondary | ICD-10-CM | POA: Diagnosis not present

## 2017-04-17 DIAGNOSIS — Z5111 Encounter for antineoplastic chemotherapy: Secondary | ICD-10-CM | POA: Insufficient documentation

## 2017-04-17 DIAGNOSIS — J449 Chronic obstructive pulmonary disease, unspecified: Secondary | ICD-10-CM | POA: Insufficient documentation

## 2017-04-17 DIAGNOSIS — Z7982 Long term (current) use of aspirin: Secondary | ICD-10-CM | POA: Insufficient documentation

## 2017-04-17 DIAGNOSIS — Z923 Personal history of irradiation: Secondary | ICD-10-CM | POA: Insufficient documentation

## 2017-04-17 DIAGNOSIS — C3412 Malignant neoplasm of upper lobe, left bronchus or lung: Secondary | ICD-10-CM | POA: Diagnosis not present

## 2017-04-17 DIAGNOSIS — I700 Atherosclerosis of aorta: Secondary | ICD-10-CM | POA: Diagnosis not present

## 2017-04-17 DIAGNOSIS — F17210 Nicotine dependence, cigarettes, uncomplicated: Secondary | ICD-10-CM | POA: Insufficient documentation

## 2017-04-17 DIAGNOSIS — E785 Hyperlipidemia, unspecified: Secondary | ICD-10-CM | POA: Diagnosis not present

## 2017-04-17 DIAGNOSIS — Z951 Presence of aortocoronary bypass graft: Secondary | ICD-10-CM | POA: Diagnosis not present

## 2017-04-17 DIAGNOSIS — I2510 Atherosclerotic heart disease of native coronary artery without angina pectoris: Secondary | ICD-10-CM | POA: Diagnosis not present

## 2017-04-17 DIAGNOSIS — N400 Enlarged prostate without lower urinary tract symptoms: Secondary | ICD-10-CM | POA: Insufficient documentation

## 2017-04-17 DIAGNOSIS — Z79899 Other long term (current) drug therapy: Secondary | ICD-10-CM | POA: Diagnosis not present

## 2017-04-17 LAB — CBC WITH DIFFERENTIAL/PLATELET
BASOS ABS: 0 10*3/uL (ref 0.0–0.1)
BASOS PCT: 1 %
EOS PCT: 9 %
Eosinophils Absolute: 0.4 10*3/uL (ref 0.0–0.5)
HCT: 36.2 % — ABNORMAL LOW (ref 38.4–49.9)
HEMOGLOBIN: 11.7 g/dL — AB (ref 13.0–17.1)
Lymphocytes Relative: 15 %
Lymphs Abs: 0.7 10*3/uL — ABNORMAL LOW (ref 0.9–3.3)
MCH: 29.8 pg (ref 27.2–33.4)
MCHC: 32.4 g/dL (ref 32.0–36.0)
MCV: 91.9 fL (ref 79.3–98.0)
Monocytes Absolute: 0.2 10*3/uL (ref 0.1–0.9)
Monocytes Relative: 4 %
NEUTROS PCT: 71 %
Neutro Abs: 3.3 10*3/uL (ref 1.5–6.5)
PLATELETS: 209 10*3/uL (ref 140–400)
RBC: 3.94 MIL/uL — AB (ref 4.20–5.82)
RDW: 16.4 % — ABNORMAL HIGH (ref 11.0–14.6)
WBC: 4.6 10*3/uL (ref 4.0–10.3)

## 2017-04-17 LAB — COMPREHENSIVE METABOLIC PANEL
ALT: 13 U/L (ref 0–55)
AST: 20 U/L (ref 5–34)
Albumin: 3.6 g/dL (ref 3.5–5.0)
Alkaline Phosphatase: 95 U/L (ref 40–150)
Anion gap: 9 (ref 3–11)
BILIRUBIN TOTAL: 0.4 mg/dL (ref 0.2–1.2)
BUN: 25 mg/dL (ref 7–26)
CO2: 26 mmol/L (ref 22–29)
CREATININE: 1.23 mg/dL (ref 0.70–1.30)
Calcium: 9.8 mg/dL (ref 8.4–10.4)
Chloride: 102 mmol/L (ref 98–109)
GFR calc Af Amer: >60 mL/min (ref >60)
GFR, EST NON AFRICAN AMERICAN: 59 mL/min — AB (ref >60)
Glucose, Bld: 100 mg/dL (ref 70–140)
POTASSIUM: 4.8 mmol/L (ref 3.5–5.1)
Sodium: 137 mmol/L (ref 136–145)
TOTAL PROTEIN: 7.5 g/dL (ref 6.4–8.3)

## 2017-04-24 ENCOUNTER — Inpatient Hospital Stay: Payer: Medicare Other

## 2017-04-24 DIAGNOSIS — J449 Chronic obstructive pulmonary disease, unspecified: Secondary | ICD-10-CM | POA: Diagnosis not present

## 2017-04-24 DIAGNOSIS — Z5111 Encounter for antineoplastic chemotherapy: Secondary | ICD-10-CM | POA: Diagnosis not present

## 2017-04-24 DIAGNOSIS — Z7689 Persons encountering health services in other specified circumstances: Secondary | ICD-10-CM | POA: Diagnosis not present

## 2017-04-24 DIAGNOSIS — C3492 Malignant neoplasm of unspecified part of left bronchus or lung: Secondary | ICD-10-CM

## 2017-04-24 DIAGNOSIS — C3412 Malignant neoplasm of upper lobe, left bronchus or lung: Secondary | ICD-10-CM | POA: Diagnosis not present

## 2017-04-24 DIAGNOSIS — Z923 Personal history of irradiation: Secondary | ICD-10-CM | POA: Diagnosis not present

## 2017-04-24 DIAGNOSIS — I2510 Atherosclerotic heart disease of native coronary artery without angina pectoris: Secondary | ICD-10-CM | POA: Diagnosis not present

## 2017-04-24 LAB — COMPREHENSIVE METABOLIC PANEL
ALBUMIN: 3.8 g/dL (ref 3.5–5.0)
ALT: 14 U/L (ref 0–55)
AST: 21 U/L (ref 5–34)
Alkaline Phosphatase: 89 U/L (ref 40–150)
Anion gap: 11 (ref 3–11)
BUN: 24 mg/dL (ref 7–26)
CHLORIDE: 105 mmol/L (ref 98–109)
CO2: 21 mmol/L — AB (ref 22–29)
Calcium: 9.5 mg/dL (ref 8.4–10.4)
Creatinine, Ser: 1.29 mg/dL (ref 0.70–1.30)
GFR calc Af Amer: >60 mL/min (ref >60)
GFR, EST NON AFRICAN AMERICAN: 55 mL/min — AB (ref >60)
GLUCOSE: 100 mg/dL (ref 70–140)
POTASSIUM: 4.6 mmol/L (ref 3.5–5.1)
SODIUM: 137 mmol/L (ref 136–145)
Total Bilirubin: 0.4 mg/dL (ref 0.2–1.2)
Total Protein: 7.7 g/dL (ref 6.4–8.3)

## 2017-04-24 LAB — CBC WITH DIFFERENTIAL/PLATELET
Basophils Absolute: 0 10*3/uL (ref 0.0–0.1)
Basophils Relative: 1 %
EOS PCT: 1 %
Eosinophils Absolute: 0 10*3/uL (ref 0.0–0.5)
HCT: 35.2 % — ABNORMAL LOW (ref 38.4–49.9)
Hemoglobin: 11.6 g/dL — ABNORMAL LOW (ref 13.0–17.1)
LYMPHS ABS: 0.5 10*3/uL — AB (ref 0.9–3.3)
LYMPHS PCT: 14 %
MCH: 30.4 pg (ref 27.2–33.4)
MCHC: 33 g/dL (ref 32.0–36.0)
MCV: 92.3 fL (ref 79.3–98.0)
MONOS PCT: 11 %
Monocytes Absolute: 0.4 10*3/uL (ref 0.1–0.9)
Neutro Abs: 2.5 10*3/uL (ref 1.5–6.5)
Neutrophils Relative %: 73 %
PLATELETS: 243 10*3/uL (ref 140–400)
RBC: 3.81 MIL/uL — ABNORMAL LOW (ref 4.20–5.82)
RDW: 16.8 % — ABNORMAL HIGH (ref 11.0–14.6)
WBC: 3.4 10*3/uL — AB (ref 4.0–10.3)

## 2017-04-29 ENCOUNTER — Encounter (HOSPITAL_COMMUNITY): Payer: Self-pay

## 2017-04-29 ENCOUNTER — Ambulatory Visit (HOSPITAL_COMMUNITY)
Admission: RE | Admit: 2017-04-29 | Discharge: 2017-04-29 | Disposition: A | Discharge status: Home / Self Care | Payer: Medicare Other | Source: Ambulatory Visit | Attending: Oncology | Admitting: Oncology

## 2017-04-29 DIAGNOSIS — J432 Centrilobular emphysema: Secondary | ICD-10-CM | POA: Diagnosis not present

## 2017-04-29 DIAGNOSIS — R918 Other nonspecific abnormal finding of lung field: Secondary | ICD-10-CM | POA: Insufficient documentation

## 2017-04-29 DIAGNOSIS — C3492 Malignant neoplasm of unspecified part of left bronchus or lung: Secondary | ICD-10-CM | POA: Insufficient documentation

## 2017-04-29 DIAGNOSIS — I700 Atherosclerosis of aorta: Secondary | ICD-10-CM | POA: Diagnosis not present

## 2017-04-29 DIAGNOSIS — C3490 Malignant neoplasm of unspecified part of unspecified bronchus or lung: Secondary | ICD-10-CM | POA: Diagnosis not present

## 2017-04-29 MED ORDER — IOPAMIDOL (ISOVUE-300) INJECTION 61%
INTRAVENOUS | Status: AC
  Administered 2017-04-29: 75 mL via INTRAVENOUS
  Filled 2017-04-29: qty 75

## 2017-05-01 ENCOUNTER — Inpatient Hospital Stay: Payer: Medicare Other

## 2017-05-01 ENCOUNTER — Encounter: Payer: Self-pay | Admitting: Oncology

## 2017-05-01 ENCOUNTER — Inpatient Hospital Stay (HOSPITAL_BASED_OUTPATIENT_CLINIC_OR_DEPARTMENT_OTHER): Payer: Medicare Other | Admitting: Oncology

## 2017-05-01 ENCOUNTER — Telehealth: Payer: Self-pay | Admitting: Oncology

## 2017-05-01 VITALS — BP 125/83 | HR 70 | Temp 97.7°F | Resp 14 | Wt 131.3 lb

## 2017-05-01 DIAGNOSIS — Z79899 Other long term (current) drug therapy: Secondary | ICD-10-CM

## 2017-05-01 DIAGNOSIS — Z7982 Long term (current) use of aspirin: Secondary | ICD-10-CM | POA: Diagnosis not present

## 2017-05-01 DIAGNOSIS — J449 Chronic obstructive pulmonary disease, unspecified: Secondary | ICD-10-CM | POA: Diagnosis not present

## 2017-05-01 DIAGNOSIS — Z923 Personal history of irradiation: Secondary | ICD-10-CM

## 2017-05-01 DIAGNOSIS — C3412 Malignant neoplasm of upper lobe, left bronchus or lung: Secondary | ICD-10-CM

## 2017-05-01 DIAGNOSIS — E785 Hyperlipidemia, unspecified: Secondary | ICD-10-CM | POA: Diagnosis not present

## 2017-05-01 DIAGNOSIS — I2510 Atherosclerotic heart disease of native coronary artery without angina pectoris: Secondary | ICD-10-CM | POA: Diagnosis not present

## 2017-05-01 DIAGNOSIS — C3492 Malignant neoplasm of unspecified part of left bronchus or lung: Secondary | ICD-10-CM

## 2017-05-01 DIAGNOSIS — N400 Enlarged prostate without lower urinary tract symptoms: Secondary | ICD-10-CM

## 2017-05-01 DIAGNOSIS — I700 Atherosclerosis of aorta: Secondary | ICD-10-CM | POA: Diagnosis not present

## 2017-05-01 DIAGNOSIS — Z5111 Encounter for antineoplastic chemotherapy: Secondary | ICD-10-CM

## 2017-05-01 DIAGNOSIS — Z7689 Persons encountering health services in other specified circumstances: Secondary | ICD-10-CM | POA: Diagnosis not present

## 2017-05-01 DIAGNOSIS — F17210 Nicotine dependence, cigarettes, uncomplicated: Secondary | ICD-10-CM | POA: Diagnosis not present

## 2017-05-01 LAB — COMPREHENSIVE METABOLIC PANEL
ALK PHOS: 80 U/L (ref 40–150)
ALT: 11 U/L (ref 0–55)
ANION GAP: 12 — AB (ref 3–11)
AST: 18 U/L (ref 5–34)
Albumin: 3.4 g/dL — ABNORMAL LOW (ref 3.5–5.0)
BILIRUBIN TOTAL: 0.3 mg/dL (ref 0.2–1.2)
BUN: 18 mg/dL (ref 7–26)
CO2: 22 mmol/L (ref 22–29)
Calcium: 9.5 mg/dL (ref 8.4–10.4)
Chloride: 106 mmol/L (ref 98–109)
Creatinine, Ser: 1.05 mg/dL (ref 0.70–1.30)
GFR calc Af Amer: >60 mL/min (ref >60)
Glucose, Bld: 82 mg/dL (ref 70–140)
POTASSIUM: 4.1 mmol/L (ref 3.5–5.1)
Sodium: 140 mmol/L (ref 136–145)
TOTAL PROTEIN: 7.3 g/dL (ref 6.4–8.3)

## 2017-05-01 LAB — CBC WITH DIFFERENTIAL/PLATELET
BASOS ABS: 0 10*3/uL (ref 0.0–0.1)
Basophils Relative: 1 %
EOS PCT: 2 %
Eosinophils Absolute: 0.1 10*3/uL (ref 0.0–0.5)
HCT: 36.2 % — ABNORMAL LOW (ref 38.4–49.9)
Hemoglobin: 11.5 g/dL — ABNORMAL LOW (ref 13.0–17.1)
LYMPHS PCT: 22 %
Lymphs Abs: 0.8 10*3/uL — ABNORMAL LOW (ref 0.9–3.3)
MCH: 30 pg (ref 27.2–33.4)
MCHC: 31.8 g/dL — ABNORMAL LOW (ref 32.0–36.0)
MCV: 94.5 fL (ref 79.3–98.0)
Monocytes Absolute: 0.4 10*3/uL (ref 0.1–0.9)
Monocytes Relative: 11 %
NEUTROS ABS: 2.3 10*3/uL (ref 1.5–6.5)
Neutrophils Relative %: 64 %
PLATELETS: 174 10*3/uL (ref 140–400)
RBC: 3.83 MIL/uL — ABNORMAL LOW (ref 4.20–5.82)
RDW: 16.2 % — AB (ref 11.0–14.6)
WBC: 3.5 10*3/uL — AB (ref 4.0–10.3)

## 2017-05-01 MED ORDER — SODIUM CHLORIDE 0.9 % IV SOLN
388.0000 mg | Freq: Once | INTRAVENOUS | Status: AC
  Administered 2017-05-01: 390 mg via INTRAVENOUS
  Filled 2017-05-01: qty 39

## 2017-05-01 MED ORDER — SODIUM CHLORIDE 0.9 % IV SOLN
20.0000 mg | Freq: Once | INTRAVENOUS | Status: AC
  Administered 2017-05-01: 20 mg via INTRAVENOUS
  Filled 2017-05-01: qty 2

## 2017-05-01 MED ORDER — PALONOSETRON HCL INJECTION 0.25 MG/5ML
0.2500 mg | Freq: Once | INTRAVENOUS | Status: AC
  Administered 2017-05-01: 0.25 mg via INTRAVENOUS

## 2017-05-01 MED ORDER — PEGFILGRASTIM 6 MG/0.6ML ~~LOC~~ PSKT
6.0000 mg | PREFILLED_SYRINGE | Freq: Once | SUBCUTANEOUS | Status: AC
  Administered 2017-05-01: 6 mg via SUBCUTANEOUS

## 2017-05-01 MED ORDER — DIPHENHYDRAMINE HCL 50 MG/ML IJ SOLN
INTRAMUSCULAR | Status: AC
  Filled 2017-05-01: qty 1

## 2017-05-01 MED ORDER — PEGFILGRASTIM 6 MG/0.6ML ~~LOC~~ PSKT
PREFILLED_SYRINGE | SUBCUTANEOUS | Status: AC
  Filled 2017-05-01: qty 0.6

## 2017-05-01 MED ORDER — DIPHENHYDRAMINE HCL 50 MG/ML IJ SOLN
50.0000 mg | Freq: Once | INTRAMUSCULAR | Status: AC
  Administered 2017-05-01: 50 mg via INTRAVENOUS

## 2017-05-01 MED ORDER — DEXAMETHASONE SODIUM PHOSPHATE 100 MG/10ML IJ SOLN
20.0000 mg | Freq: Once | INTRAMUSCULAR | Status: AC
  Administered 2017-05-01: 20 mg via INTRAVENOUS
  Filled 2017-05-01: qty 2

## 2017-05-01 MED ORDER — SODIUM CHLORIDE 0.9 % IV SOLN
Freq: Once | INTRAVENOUS | Status: AC
  Administered 2017-05-01: 10:00:00 via INTRAVENOUS

## 2017-05-01 MED ORDER — SODIUM CHLORIDE 0.9 % IV SOLN
175.0000 mg/m2 | Freq: Once | INTRAVENOUS | Status: AC
  Administered 2017-05-01: 318 mg via INTRAVENOUS
  Filled 2017-05-01: qty 53

## 2017-05-01 MED ORDER — PALONOSETRON HCL INJECTION 0.25 MG/5ML
INTRAVENOUS | Status: AC
  Filled 2017-05-01: qty 5

## 2017-05-01 MED ORDER — COLD PACK MISC ONCOLOGY
1.0000 | Freq: Once | Status: DC | PRN
  Filled 2017-05-01: qty 1

## 2017-05-01 MED ORDER — FAMOTIDINE IN NACL 20-0.9 MG/50ML-% IV SOLN: 20.0000 mg | Freq: Once | INTRAVENOUS | Status: DC

## 2017-05-01 NOTE — Patient Instructions (Signed)
[facility] Discharge Instructions for Patients Receiving Chemotherapy  Today you received the following chemotherapy agents Taxol and carboplatin  To help prevent nausea and vomiting after your treatment, we encourage you to take your nausea medication as directed   If you develop nausea and vomiting that is not controlled by your nausea medication, call the clinic.   BELOW ARE SYMPTOMS THAT SHOULD BE REPORTED IMMEDIATELY:  *FEVER GREATER THAN 100.5 F  *CHILLS WITH OR WITHOUT FEVER  NAUSEA AND VOMITING THAT IS NOT CONTROLLED WITH YOUR NAUSEA MEDICATION  *UNUSUAL SHORTNESS OF BREATH  *UNUSUAL BRUISING OR BLEEDING  TENDERNESS IN MOUTH AND THROAT WITH OR WITHOUT PRESENCE OF ULCERS  *URINARY PROBLEMS  *BOWEL PROBLEMS  UNUSUAL RASH Items with * indicate a potential emergency and should be followed up as soon as possible.  Feel free to call the clinic should you have any questions or concerns. The clinic phone number is [phone].  Please show the CHEMO ALERT CARD at check-in to the Emergency Department and triage nurse.   

## 2017-05-01 NOTE — Assessment & Plan Note (Signed)
This is a very pleasant 67 year old African-American male with unresectable a stage IIIa non-small cell lung cancer, status post a course of concurrent chemoradiation with weekly carboplatin and paclitaxel and tolerated the treatment well. His scan showed no evidence for disease progression and the patient was started on consolidation treatment with immunotherapy with Imfinzi (Durvalumab) status post 2 cycles.This treatment was discontinued secondary to a significant skin rash treated with steroids with recurrence after the patient was resumed again. He has been on observation for the last few months. Repeat CT scan of the chest showed improvement of the primary apical left upper lobe cavitary mass but there was development of new 2.0 cm solid pulmonary nodules at the medial central margin of the primary mass in addition to another new 0.5 cm left upper lobe pulmonary nodule. These findings are suspicious for metastatic disease. The patient isnow onpalliative systemic chemotherapy with carboplatin for AUC of 5 and paclitaxel 175 mg/M2 every 3 weeks.He  is tolerating his chemotherapy well overall.  He had a recent restaging CT scan and is here to discuss the results.  Patient was seen with Dr. Julien Nordmann.  CT scan results were discussed with the patient which overall shows stable disease.  There is a slight increase in 1 of the lymph nodes which could be related to disease progression versus sequela of prior therapy.  Recommend that he continue his current treatment.  We will continue to follow this on future CT scans.  Plan is for 3 additional cycles before restaging again.   Recommend that he proceed with cycle 4 of his treatment today as scheduled.He will continue to have weekly labs.  He will have a follow-up visit in 3 weeks for evaluation prior to cycle 5 of his chemotherapy.   The patient was advised to call immediately if he has any concerning symptoms in the interval. The patient voices  understanding of current disease status and treatment options and is in agreement with the current care plan. All questions were answered. The patient knows to call the clinic with any problems, questions or concerns. We can certainly see the patient much sooner if necessary.

## 2017-05-01 NOTE — Progress Notes (Signed)
[facility] OFFICE PROGRESS NOTE  [name], MD [place] [address]  DIAGNOSIS: Stage IIIa (T2b, N2, M0) non-small cell lung cancer, squamous cell carcinoma presented with left upper lobe lung mass in addition to AP window lymphadenopathy diagnosed in February 2018.  PRIOR THERAPY: 1)Concurrent chemoradiation therapy. He received radiation for approximately 2-3 weeks prior to starting his chemotherapy. He completed his radiation on 07/05/2016. He received 2 doses of weekly carboplatin for an AUC of 2 and paclitaxel 45 mg meter squared which was last given on 07/03/2016. 2)consolidation treatment with immunotherapy with Imfinzi (Durvalumab) 10 MG/KG every 2 weeks. First dose 08/22/2016. Status post 2 cycles.Last dose was given September 19, 2016 discontinued secondary to intolerance and significant skin rash.  CURRENT THERAPY: Systemic chemotherapy with carboplatin for AUC of 5 and paclitaxel 175 mg/M2 every 3 weeks. First dose12/20/2018.Status post 3 cycles.  INTERVAL HISTORY: [name] 67 y.o. male returns for routine follow-up visit by himself.  Patient is feeling fine today has no specific complaints.  He denies fevers and chills.  Denies chest pain, shortness breath, cough, hemoptysis.  Denies nausea, vomiting, constipation, diarrhea.  Patient denies any significant weight loss or night sweats.  The patient is here for evaluation prior to cycle #4 of his chemotherapy and to review his restaging CT scan results.  MEDICAL HISTORY: Past Medical History:  Diagnosis Date  . AAA (abdominal aortic aneurysm) ([place])   . Abdominal aortic aneurysm ([place])    AAA And bilateral common iliac artery aneurysms  . BPH (benign prostatic hyperplasia)   . CAD (coronary artery disease)    Dr Ron Parker @ [facility]   . Chronic mental illness 04/21/2011   Diagnosis unclear,  Family provides good care  . COPD (chronic obstructive pulmonary disease) ([place]) 04/21/2011    smoking  . Drug-induced skin rash 09/05/2016  . Ejection fraction    EF 35-40%, echo, May, 2010  . Hyperlipidemia   . Hyperplastic colon polyp 04/21/2011  . Hypertension   . Preop cardiovascular exam    Cardiac clearance for major vascular surgery, May, 2013  . Pulmonary nodule    Chest CT May, 2013, 2 small pulmonary nodules, needs appropriate followup,  this CT was not ordered by the cardiology team  . PVD (peripheral vascular disease) ([place]) 04/21/2011  . Retention of urine   . Shortness of breath   . Squamous cell lung cancer, left ([place]) 06/13/2016  . Tobacco abuse     ALLERGIES:  is allergic to ace inhibitors.  MEDICATIONS:  Current Outpatient Medications  Medication Sig Dispense Refill  . aspirin EC 81 MG tablet Take 81 mg by mouth daily.    . ondansetron (ZOFRAN ODT) 4 MG disintegrating tablet Take 1 tablet (4 mg total) by mouth every 8 (eight) hours as needed for nausea or vomiting. 10 tablet 0  . prochlorperazine (COMPAZINE) 10 MG tablet Take 1 tablet (10 mg total) by mouth every 6 (six) hours as needed for nausea or vomiting. 30 tablet 0  . psyllium (METAMUCIL) 58.6 % packet Take 1 packet by mouth daily. 30 each 12  . rosuvastatin (CRESTOR) 40 MG tablet TAKE 1 TABLET BY MOUTH EVERY DAY 90 tablet 0  . Sennosides (SENOKOT PO) Take 1 tablet by mouth daily as needed (CONSTIPATION).    [name] sucralfate (CARAFATE) 1 GM/10ML suspension Take 10 mLs (1 g total) by mouth 4 (four) times daily -  with meals and at bedtime. 420 mL 0  No current facility-administered medications for this visit.    Facility-Administered Medications Ordered in Other Visits  Medication Dose Route Frequency Provider Last Rate Last Dose  . CARBOplatin (PARAPLATIN) 390 mg in sodium chloride 0.9 % 100 mL chemo infusion  390 mg Intravenous Once Curt Bears, MD      . Abbe [place] Pack 1 packet  1 packet Topical Once PRN Curt Bears, MD      . PACLitaxel (TAXOL) 318 mg in sodium chloride 0.9 % 500 mL chemo infusion (>  45m/m2)  175 mg/m2 (Treatment Plan Recorded) Intravenous Once MCurt Bears MD 184 mL/hr at 05/01/17 1150 318 mg at 05/01/17 1150  . pegfilgrastim (NEULASTA ONPRO KIT) injection 6 mg  6 mg Subcutaneous Once MCurt Bears MD        SURGICAL HISTORY:  Past Surgical History:  Procedure Laterality Date  . ABDOMINAL AORTA STENT  07/2011  . ABDOMINAL AORTAGRAM N/A 07/26/2011   Procedure: ABDOMINAL AMaxcine Ham  Surgeon: BConrad Bethlehem MD;  [facility];  Service: Cardiovascular;  Laterality: N/A;  . ABDOMINAL AORTIC ANEURYSM REPAIR  07/2011  . CORONARY ARTERY BYPASS GRAFT  07/2008   CABG X5/notes 07/12/2010  . EMBOLIZATION Left 07/26/2011   Procedure: EMBOLIZATION;  Surgeon: BConrad Adams MD;  [facility];  Service: Cardiovascular;  Laterality: Left;  . TRANSURETHRAL RESECTION OF PROSTATE  11/29/2011   Procedure: TRANSURETHRAL RESECTION OF THE PROSTATE WITH GYRUS INSTRUMENTS;  Surgeon: JMalka So MD;  Location: [facility];  Service: Urology;  Laterality: N/A;  Prostate Ultrasound, and Biopsy  . VIDEO BRONCHOSCOPY Bilateral 04/12/2016   Procedure: VIDEO BRONCHOSCOPY WITH FLUORO;  Surgeon: DJuanito Doom MD;  [facility]  Service: Cardiopulmonary;  Laterality: Bilateral;    REVIEW OF SYSTEMS:   Review of Systems  Constitutional: Negative for appetite change, chills, fatigue, fever and unexpected weight change.  HENT:   Negative for mouth sores, nosebleeds, sore throat and trouble swallowing.   Eyes: Negative for eye problems and icterus.  Respiratory: Negative for cough, hemoptysis, shortness of breath and wheezing.   Cardiovascular: Negative for chest pain and leg swelling.  Gastrointestinal: Negative for abdominal pain, constipation, diarrhea, nausea and vomiting.  Genitourinary: Negative for bladder incontinence, difficulty urinating, dysuria, frequency and hematuria.   Musculoskeletal: Negative for back pain, gait problem, neck pain and neck stiffness.  Skin:  Negative for itching and rash.  Neurological: Negative for dizziness, extremity weakness, gait problem, headaches, light-headedness and seizures.  Hematological: Negative for adenopathy. Does not bruise/bleed easily.  Psychiatric/Behavioral: Negative for confusion, depression and sleep disturbance. The patient is not nervous/anxious.     PHYSICAL EXAMINATION:  Blood pressure 125/83, pulse 70, temperature 97.7 F (36.5 C), temperature source Oral, resp. rate 14, weight 131 lb 4.8 oz (59.6 kg), SpO2 96 %.  ECOG PERFORMANCE STATUS: 1 - Symptomatic but completely ambulatory  Physical Exam  Constitutional: Oriented to person, place, and time and well-developed, well-nourished, and in no distress. No distress.  HENT:  Head: Normocephalic and atraumatic.  Mouth/Throat: Oropharynx is clear and moist. No oropharyngeal exudate.  Eyes: Conjunctivae are normal. Right eye exhibits no discharge. Left eye exhibits no discharge. No scleral icterus.  Neck: Normal range of motion. Neck supple.  Cardiovascular: Normal rate, regular rhythm, normal heart sounds and intact distal pulses.   Pulmonary/Chest: Effort normal and breath sounds normal. No respiratory distress. No wheezes. No rales.  Abdominal: Soft. Bowel sounds are normal. Exhibits no distension and no mass. There is no tenderness.  Musculoskeletal: Normal  range of motion. Exhibits no edema.  Lymphadenopathy:    No cervical adenopathy.  Neurological: Alert and oriented to person, place, and time. Exhibits normal muscle tone. Gait normal. Coordination normal.  Skin: Skin is warm and dry. No rash noted. Not diaphoretic. No erythema. No pallor.  Psychiatric: Mood, memory and judgment normal.  Vitals reviewed.  LABORATORY DATA: Lab Results  Component Value Date   WBC 3.5 (L) 05/01/2017   HGB 11.5 (L) 05/01/2017   HCT 36.2 (L) 05/01/2017   MCV 94.5 05/01/2017   PLT 174 05/01/2017      Chemistry      Component Value Date/Time   NA 140  05/01/2017 0733   NA 139 03/13/2017 1059   K 4.1 05/01/2017 0733   K 3.9 03/13/2017 1059   CL 106 05/01/2017 0733   CO2 22 05/01/2017 0733   CO2 26 03/13/2017 1059   BUN 18 05/01/2017 0733   BUN 18.6 03/13/2017 1059   CREATININE 1.05 05/01/2017 0733   CREATININE 1.2 03/13/2017 1059      Component Value Date/Time   CALCIUM 9.5 05/01/2017 0733   CALCIUM 8.9 03/13/2017 1059   ALKPHOS 80 05/01/2017 0733   ALKPHOS 118 03/13/2017 1059   AST 18 05/01/2017 0733   AST 16 03/13/2017 1059   ALT 11 05/01/2017 0733   ALT 14 03/13/2017 1059   BILITOT 0.3 05/01/2017 0733   BILITOT 0.36 03/13/2017 1059       RADIOGRAPHIC STUDIES:  Ct Chest W Contrast  Result Date: 04/29/2017 CLINICAL DATA:  Squamous cell lung cancer.  Restaging. EXAM: CT CHEST WITH CONTRAST TECHNIQUE: Multidetector CT imaging of the chest was performed during intravenous contrast administration. CONTRAST:  75 cc Isovue-300. COMPARISON:  Chest CT 01/21/2017 and 08/08/2016.  PET-CT 04/25/2016. FINDINGS: Cardiovascular: No acute vascular findings. There is atherosclerosis of the aorta, great vessels and coronary arteries status post median sternotomy and CABG. Abdominal aortic stent graft partially imaged. The heart size is normal. There is no pericardial effusion. Mediastinum/Nodes: Nodal assessment limited by paucity of mediastinal and subcutaneous fat. Left axillary nodularity appears stable, likely vascular. No enlarged mediastinal, hilar or axillary lymph nodes are seen. The thyroid gland, trachea and esophagus demonstrate no significant findings. Lungs/Pleura: There is no pleural effusion or pneumothorax. Again demonstrated is severe centrilobular and paraseptal emphysema. There is stable volume loss at the left apex. The air-containing, cavitary apical component of the left upper lobe lesion is slightly smaller. The more inferior suprahilar component demonstrates further enlargement, measuring 2.4 x 1.8 cm on image 38 (previously  2.0 x 1.8 cm). This demonstrates central low density consistent with necrosis. 5 mm left upper lobe nodule peripherally on image 42 is stable. The left lower lobe nodule measuring 5 mm on image 91 is also stable. Upper abdomen:  Stable without acute findings. Musculoskeletal/Chest wall: There is no chest wall mass or suspicious osseous finding. Diffuse paucity of subcutaneous fat. IMPRESSION: 1. Further enlargement of necrotic appearing left suprahilar nodule, potentially disease progression versus sequela of prior therapy. Consider PET-CT for further evaluation. The more superior apical component is smaller. 2. No other enlarging pulmonary nodules or progressive adenopathy. 3. Aortic Atherosclerosis (ICD10-I70.0) and Emphysema (ICD10-J43.9). Electronically Signed   By: Richardean Sale M.D.   On: 04/29/2017 15:24     ASSESSMENT/PLAN:  Squamous cell lung cancer, left [facility]) This is a very pleasant 67 year old African-American male with unresectable a stage IIIa non-small cell lung cancer, status post a course of concurrent chemoradiation with weekly carboplatin  and paclitaxel and tolerated the treatment well. His scan showed no evidence for disease progression and the patient was started on consolidation treatment with immunotherapy with Imfinzi (Durvalumab) status post 2 cycles.This treatment was discontinued secondary to a significant skin rash treated with steroids with recurrence after the patient was resumed again. He has been on observation for the last few months. Repeat CT scan of the chest showed improvement of the primary apical left upper lobe cavitary mass but there was development of new 2.0 cm solid pulmonary nodules at the medial central margin of the primary mass in addition to another new 0.5 cm left upper lobe pulmonary nodule. These findings are suspicious for metastatic disease. The patient isnow onpalliative systemic chemotherapy with carboplatin for AUC of 5 and paclitaxel 175 mg/M2  every 3 weeks.He  is tolerating his chemotherapy well overall.  He had a recent restaging CT scan and is here to discuss the results.  Patient was seen with Dr. Julien Nordmann.  CT scan results were discussed with the patient which overall shows stable disease.  There is a slight increase in 1 of the lymph nodes which could be related to disease progression versus sequela of prior therapy.  Recommend that he continue his current treatment.  We will continue to follow this on future CT scans.  Plan is for 3 additional cycles before restaging again.   Recommend that he proceed with cycle 4 of his treatment today as scheduled.He will continue to have weekly labs.  He will have a follow-up visit in 3 weeks for evaluation prior to cycle 5 of his chemotherapy.   The patient was advised to call immediately if he has any concerning symptoms in the interval. The patient voices understanding of current disease status and treatment options and is in agreement with the current care plan. All questions were answered. The patient knows to call the clinic with any problems, questions or concerns. We can certainly see the patient much sooner if necessary.  No orders of the defined types were placed in this encounter.   Mikey Bussing, DNP, AGPCNP-BC, AOCNP 05/01/17  ADDENDUM: Hematology/Oncology Attending: I had a face-to-face encounter with the patient today.  I recommended his care plan.  This is a very pleasant 67 years old African-American male with metastatic non-small cell lung cancer that was initially diagnosed as unresectable stage IIIA non-small cell lung cancer, squamous cell carcinoma status post a course of concurrent chemoradiation followed by 2 cycles of treatment with consolidation immunotherapy with Imfinzi (Durvalumab) but this was discontinued secondary to intolerance and development of severe skin rash. The patient developed disease progression and he was a started on systemic chemotherapy with  carboplatin and paclitaxel status post 3 cycles.  Has been tolerating this treatment fairly well with no significant complaints.  He had repeat CT scan of the chest, abdomen and pelvis performed recently.  I personally and independently reviewed the scans and discussed the results with the patient today.  His a scan showed a stable disease except for a slight increase in 1 of the mediastinal lymph nodes. I recommended for the patient to continue his current systemic chemotherapy with carboplatin and paclitaxel and he will proceed with cycle #4 today.  We will continue to monitor this lymphadenopathy closely on the upcoming scan. He will come back for follow-up visit in 3 weeks for reevaluation before starting cycle #5. The patient was advised to call immediately if he has any concerning symptoms in the interval.  Disclaimer: This note was dictated  with voice recognition software. Similar sounding words can inadvertently be transcribed and may be missed upon review. Eilleen Kempf, MD 05/01/17

## 2017-05-01 NOTE — Telephone Encounter (Signed)
Scheduled appt per 2/20 los.

## 2017-05-07 ENCOUNTER — Ambulatory Visit: Payer: Medicare Other | Admitting: Internal Medicine

## 2017-05-07 ENCOUNTER — Telehealth: Payer: Self-pay | Admitting: Medical Oncology

## 2017-05-07 NOTE — Telephone Encounter (Signed)
[name] asking about scans. I left message for her to call me back.

## 2017-05-08 ENCOUNTER — Inpatient Hospital Stay: Payer: Medicare Other

## 2017-05-08 DIAGNOSIS — I2510 Atherosclerotic heart disease of native coronary artery without angina pectoris: Secondary | ICD-10-CM | POA: Diagnosis not present

## 2017-05-08 DIAGNOSIS — Z5111 Encounter for antineoplastic chemotherapy: Secondary | ICD-10-CM | POA: Diagnosis not present

## 2017-05-08 DIAGNOSIS — Z923 Personal history of irradiation: Secondary | ICD-10-CM | POA: Diagnosis not present

## 2017-05-08 DIAGNOSIS — Z7689 Persons encountering health services in other specified circumstances: Secondary | ICD-10-CM | POA: Diagnosis not present

## 2017-05-08 DIAGNOSIS — C3412 Malignant neoplasm of upper lobe, left bronchus or lung: Secondary | ICD-10-CM | POA: Diagnosis not present

## 2017-05-08 DIAGNOSIS — C3492 Malignant neoplasm of unspecified part of left bronchus or lung: Secondary | ICD-10-CM

## 2017-05-08 DIAGNOSIS — J449 Chronic obstructive pulmonary disease, unspecified: Secondary | ICD-10-CM | POA: Diagnosis not present

## 2017-05-08 LAB — CBC WITH DIFFERENTIAL/PLATELET
BASOS ABS: 0 10*3/uL (ref 0.0–0.1)
Basophils Relative: 1 %
EOS PCT: 5 %
Eosinophils Absolute: 0.2 10*3/uL (ref 0.0–0.5)
HEMATOCRIT: 34.2 % — AB (ref 38.4–49.9)
Hemoglobin: 11 g/dL — ABNORMAL LOW (ref 13.0–17.1)
LYMPHS ABS: 0.6 10*3/uL — AB (ref 0.9–3.3)
LYMPHS PCT: 16 %
MCH: 29.8 pg (ref 27.2–33.4)
MCHC: 32.2 g/dL (ref 32.0–36.0)
MCV: 92.7 fL (ref 79.3–98.0)
MONO ABS: 0.1 10*3/uL (ref 0.1–0.9)
Monocytes Relative: 2 %
NEUTROS ABS: 2.9 10*3/uL (ref 1.5–6.5)
Neutrophils Relative %: 76 %
PLATELETS: 139 10*3/uL — AB (ref 140–400)
RBC: 3.69 MIL/uL — ABNORMAL LOW (ref 4.20–5.82)
RDW: 15.6 % — AB (ref 11.0–14.6)
WBC: 3.8 10*3/uL — ABNORMAL LOW (ref 4.0–10.3)

## 2017-05-08 LAB — COMPREHENSIVE METABOLIC PANEL
ALBUMIN: 3.5 g/dL (ref 3.5–5.0)
ALT: 13 U/L (ref 0–55)
ANION GAP: 10 (ref 3–11)
AST: 18 U/L (ref 5–34)
Alkaline Phosphatase: 82 U/L (ref 40–150)
BILIRUBIN TOTAL: 0.5 mg/dL (ref 0.2–1.2)
BUN: 30 mg/dL — AB (ref 7–26)
CHLORIDE: 102 mmol/L (ref 98–109)
CO2: 24 mmol/L (ref 22–29)
Calcium: 9.7 mg/dL (ref 8.4–10.4)
Creatinine, Ser: 1.3 mg/dL (ref 0.70–1.30)
GFR calc Af Amer: >60 mL/min (ref >60)
GFR calc non Af Amer: 54 mL/min — ABNORMAL LOW (ref >60)
GLUCOSE: 104 mg/dL (ref 70–140)
POTASSIUM: 4.5 mmol/L (ref 3.5–5.1)
SODIUM: 136 mmol/L (ref 136–145)
TOTAL PROTEIN: 7.5 g/dL (ref 6.4–8.3)

## 2017-05-15 ENCOUNTER — Inpatient Hospital Stay: Payer: Medicare Other | Attending: Internal Medicine

## 2017-05-15 DIAGNOSIS — N400 Enlarged prostate without lower urinary tract symptoms: Secondary | ICD-10-CM | POA: Diagnosis not present

## 2017-05-15 DIAGNOSIS — Z923 Personal history of irradiation: Secondary | ICD-10-CM | POA: Insufficient documentation

## 2017-05-15 DIAGNOSIS — Z9221 Personal history of antineoplastic chemotherapy: Secondary | ICD-10-CM | POA: Diagnosis not present

## 2017-05-15 DIAGNOSIS — R5383 Other fatigue: Secondary | ICD-10-CM | POA: Insufficient documentation

## 2017-05-15 DIAGNOSIS — R634 Abnormal weight loss: Secondary | ICD-10-CM | POA: Insufficient documentation

## 2017-05-15 DIAGNOSIS — J449 Chronic obstructive pulmonary disease, unspecified: Secondary | ICD-10-CM | POA: Insufficient documentation

## 2017-05-15 DIAGNOSIS — Z5111 Encounter for antineoplastic chemotherapy: Secondary | ICD-10-CM | POA: Insufficient documentation

## 2017-05-15 DIAGNOSIS — E785 Hyperlipidemia, unspecified: Secondary | ICD-10-CM | POA: Diagnosis not present

## 2017-05-15 DIAGNOSIS — I10 Essential (primary) hypertension: Secondary | ICD-10-CM | POA: Diagnosis not present

## 2017-05-15 DIAGNOSIS — F17210 Nicotine dependence, cigarettes, uncomplicated: Secondary | ICD-10-CM | POA: Insufficient documentation

## 2017-05-15 DIAGNOSIS — C3412 Malignant neoplasm of upper lobe, left bronchus or lung: Secondary | ICD-10-CM | POA: Insufficient documentation

## 2017-05-15 DIAGNOSIS — Z7689 Persons encountering health services in other specified circumstances: Secondary | ICD-10-CM | POA: Diagnosis not present

## 2017-05-15 DIAGNOSIS — Z79899 Other long term (current) drug therapy: Secondary | ICD-10-CM | POA: Insufficient documentation

## 2017-05-15 DIAGNOSIS — I2510 Atherosclerotic heart disease of native coronary artery without angina pectoris: Secondary | ICD-10-CM | POA: Diagnosis not present

## 2017-05-15 DIAGNOSIS — C3492 Malignant neoplasm of unspecified part of left bronchus or lung: Secondary | ICD-10-CM

## 2017-05-15 LAB — COMPREHENSIVE METABOLIC PANEL
ALT: 15 U/L (ref 0–55)
AST: 18 U/L (ref 5–34)
Albumin: 3.5 g/dL (ref 3.5–5.0)
Alkaline Phosphatase: 86 U/L (ref 40–150)
Anion gap: 8 (ref 3–11)
BILIRUBIN TOTAL: 0.3 mg/dL (ref 0.2–1.2)
BUN: 13 mg/dL (ref 7–26)
CO2: 26 mmol/L (ref 22–29)
CREATININE: 1.03 mg/dL (ref 0.70–1.30)
Calcium: 9.7 mg/dL (ref 8.4–10.4)
Chloride: 108 mmol/L (ref 98–109)
GFR calc Af Amer: >60 mL/min (ref >60)
Glucose, Bld: 95 mg/dL (ref 70–140)
Potassium: 4.1 mmol/L (ref 3.5–5.1)
Sodium: 142 mmol/L (ref 136–145)
TOTAL PROTEIN: 7.5 g/dL (ref 6.4–8.3)

## 2017-05-15 LAB — CBC WITH DIFFERENTIAL/PLATELET
BASOS PCT: 1 %
Basophils Absolute: 0 10*3/uL (ref 0.0–0.1)
EOS ABS: 0.1 10*3/uL (ref 0.0–0.5)
EOS PCT: 3 %
HCT: 33.8 % — ABNORMAL LOW (ref 38.4–49.9)
HEMOGLOBIN: 11.1 g/dL — AB (ref 13.0–17.1)
LYMPHS ABS: 0.5 10*3/uL — AB (ref 0.9–3.3)
Lymphocytes Relative: 19 %
MCH: 30.5 pg (ref 27.2–33.4)
MCHC: 32.7 g/dL (ref 32.0–36.0)
MCV: 93.2 fL (ref 79.3–98.0)
MONOS PCT: 15 %
Monocytes Absolute: 0.4 10*3/uL (ref 0.1–0.9)
NEUTROS PCT: 62 %
Neutro Abs: 1.5 10*3/uL (ref 1.5–6.5)
PLATELETS: 249 10*3/uL (ref 140–400)
RBC: 3.63 MIL/uL — ABNORMAL LOW (ref 4.20–5.82)
RDW: 17.2 % — ABNORMAL HIGH (ref 11.0–14.6)
WBC: 2.4 10*3/uL — AB (ref 4.0–10.3)

## 2017-05-16 ENCOUNTER — Encounter: Payer: Self-pay | Admitting: Internal Medicine

## 2017-05-16 ENCOUNTER — Ambulatory Visit (INDEPENDENT_AMBULATORY_CARE_PROVIDER_SITE_OTHER): Payer: Medicare Other | Admitting: Internal Medicine

## 2017-05-16 ENCOUNTER — Other Ambulatory Visit (INDEPENDENT_AMBULATORY_CARE_PROVIDER_SITE_OTHER): Payer: Medicare Other

## 2017-05-16 VITALS — BP 134/86 | HR 78 | Temp 97.9°F | Ht 75.0 in | Wt 136.0 lb

## 2017-05-16 DIAGNOSIS — E785 Hyperlipidemia, unspecified: Secondary | ICD-10-CM | POA: Diagnosis not present

## 2017-05-16 DIAGNOSIS — R972 Elevated prostate specific antigen [PSA]: Secondary | ICD-10-CM

## 2017-05-16 DIAGNOSIS — I10 Essential (primary) hypertension: Secondary | ICD-10-CM | POA: Diagnosis not present

## 2017-05-16 DIAGNOSIS — F17200 Nicotine dependence, unspecified, uncomplicated: Secondary | ICD-10-CM

## 2017-05-16 LAB — LIPID PANEL
CHOLESTEROL: 174 mg/dL (ref 0–200)
HDL: 60.4 mg/dL (ref >39.00)
LDL Cholesterol: 94 mg/dL (ref 0–99)
NonHDL: 113.71
TRIGLYCERIDES: 98 mg/dL (ref 0.0–149.0)
Total CHOL/HDL Ratio: 3
VLDL: 19.6 mg/dL (ref 0.0–40.0)

## 2017-05-16 LAB — PSA: PSA: 1.92 ng/mL (ref 0.10–4.00)

## 2017-05-16 NOTE — Progress Notes (Signed)
Subjective:    Patient ID: [name], male    DOB: [birth year], 67 y.o.   MRN: [id number]  HPI  Here to f/u with sister; overall doing ok,  Pt denies chest pain, increasing sob or doe, wheezing, orthopnea, PND, increased LE swelling, palpitations, dizziness or syncope.  Pt denies new neurological symptoms such as new headache, or facial or extremity weakness or numbness.  Pt denies polydipsia, polyuria, or low sugar episode.  Pt states overall good compliance with meds, mostly trying to follow appropriate diet, with wt overall stable,  but little exercise however.  Has cmt and lab weekly related to chemo.  Not ready to quit smoking.  Denies urinary symptoms such as dysuria, frequency, urgency, flank pain, hematuria or n/v, fever, chills.  No other interval hx or new complaints Past Medical History:  Diagnosis Date  . AAA (abdominal aortic aneurysm) ([place])   . Abdominal aortic aneurysm ([place])    AAA And bilateral common iliac artery aneurysms  . BPH (benign prostatic hyperplasia)   . CAD (coronary artery disease)    Dr Ron Parker @ [facility]   . Chronic mental illness 04/21/2011   Diagnosis unclear,  Family provides good care  . COPD (chronic obstructive pulmonary disease) ([place]) 04/21/2011   smoking  . Drug-induced skin rash 09/05/2016  . Ejection fraction    EF 35-40%, echo, May, 2010  . Hyperlipidemia   . Hyperplastic colon polyp 04/21/2011  . Hypertension   . Preop cardiovascular exam    Cardiac clearance for major vascular surgery, May, 2013  . Pulmonary nodule    Chest CT May, 2013, 2 small pulmonary nodules, needs appropriate followup,  this CT was not ordered by the cardiology team  . PVD (peripheral vascular disease) ([place]) 04/21/2011  . Retention of urine   . Shortness of breath   . Squamous cell lung cancer, left ([place]) 06/13/2016  . Tobacco abuse    Past Surgical History:  Procedure Laterality Date  . ABDOMINAL AORTA STENT  07/2011  . ABDOMINAL AORTAGRAM N/A 07/26/2011   Procedure:  ABDOMINAL Maxcine Ham;  Surgeon: Conrad Langhorne, MD;  Location: [facility] C[facility] LAB;  Service: Cardiovascular;  Laterality: N/A;  . ABDOMINAL AORTIC ANEURYSM REPAIR  07/2011  . CORONARY ARTERY BYPASS GRAFT  07/2008   CABG X5/notes 07/12/2010  . EMBOLIZATION Left 07/26/2011   Procedure: EMBOLIZATION;  Surgeon: Conrad [place], MD;  [facility] C[facility];  Service: Cardiovascular;  Laterality: Left;  . TRANSURETHRAL RESECTION OF PROSTATE  11/29/2011   Procedure: TRANSURETHRAL RESECTION OF THE PROSTATE WITH GYRUS INSTRUMENTS;  Surgeon: Malka So, MD;  Location: [facility];  Service: Urology;  Laterality: N/A;  Prostate Ultrasound, and Biopsy  . VIDEO BRONCHOSCOPY Bilateral 04/12/2016   Procedure: VIDEO BRONCHOSCOPY WITH FLUORO;  Surgeon: Juanito Doom, MD;  Location: [facility];  Service: Cardiopulmonary;  Laterality: Bilateral;    reports that he has been smoking cigarettes.  He has a 50.00 pack-year smoking history. he has never used smokeless tobacco. He reports that he does not drink alcohol or use drugs. family history includes Arthritis in his sister; Cancer in his father; Diabetes in his father; Heart attack (age of onset: 3) in his father; Heart disease in his father; Hyperlipidemia in his sister; Hypertension in his sister. Allergies  Allergen Reactions  . Ace Inhibitors Itching   Current Outpatient Medications on File Prior to Visit  Medication Sig Dispense Refill  . aspirin EC 81 MG tablet Take 81 mg by mouth daily.    [name]  ondansetron (ZOFRAN ODT) 4 MG disintegrating tablet Take 1 tablet (4 mg total) by mouth every 8 (eight) hours as needed for nausea or vomiting. 10 tablet 0  . prochlorperazine (COMPAZINE) 10 MG tablet Take 1 tablet (10 mg total) by mouth every 6 (six) hours as needed for nausea or vomiting. 30 tablet 0  . psyllium (METAMUCIL) 58.6 % packet Take 1 packet by mouth daily. 30 each 12  . rosuvastatin (CRESTOR) 40 MG tablet TAKE 1 TABLET BY MOUTH EVERY DAY 90 tablet 0  . Sennosides  (SENOKOT PO) Take 1 tablet by mouth daily as needed (CONSTIPATION).    [name] sucralfate (CARAFATE) 1 GM/10ML suspension Take 10 mLs (1 g total) by mouth 4 (four) times daily -  with meals and at bedtime. 420 mL 0   No current facility-administered medications on file prior to visit.    Review of Systems  Constitutional: Negative for other unusual diaphoresis or sweats HENT: Negative for ear discharge or swelling Eyes: Negative for other worsening visual disturbances Respiratory: Negative for stridor or other swelling  Gastrointestinal: Negative for worsening distension or other blood Genitourinary: Negative for retention or other urinary change Musculoskeletal: Negative for other MSK pain or swelling Skin: Negative for color change or other new lesions Neurological: Negative for worsening tremors and other numbness  Psychiatric/Behavioral: Negative for worsening agitation or other fatigue All other system neg per pt    Objective:   Physical Exam BP 134/86   Pulse 78   Temp 97.9 F (36.6 C) (Oral)   Ht 6\' 3"  (1.905 m)   Wt 136 lb (61.7 kg)   SpO2 96%   BMI 17.00 kg/m  VS noted,  Constitutional: Pt appears in NAD HENT: Head: NCAT.  Right Ear: External ear normal.  Left Ear: External ear normal.  Eyes: . Pupils are equal, round, and reactive to light. Conjunctivae and EOM are normal Nose: without d/c or deformity Neck: Neck supple. Gross normal ROM Cardiovascular: Normal rate and regular rhythm.   Pulmonary/Chest: Effort normal and breath sounds without rales or wheezing.  Abd:  Soft, NT, ND, + BS, no organomegaly Neurological: Pt is alert. At baseline orientation, motor grossly intact Skin: Skin is warm. No rashes, other new lesions, no LE edema Psychiatric: Pt behavior is normal without agitation  No other exam findings    Assessment & Plan:

## 2017-05-16 NOTE — Patient Instructions (Signed)
Please quit smoking, or at least changing to the Warrior E-cigarrettes  Please continue all other medications as before, and refills have been done if requested.  Please have the pharmacy call with any other refills you may need.  Please continue your efforts at being more active, low cholesterol diet, and weight control.  Please keep your appointments with your specialists as you may have planned  Please go to the LAB in the Basement (turn left off the elevator) for the tests to be done today  You will be contacted by phone if any changes need to be made immediately.  Otherwise, you will receive a letter about your results with an explanation, but please check with MyChart first.  Please remember to sign up for MyChart if you have not done so, as this will be important to you in the future with finding out test results, communicating by private email, and scheduling acute appointments online when needed.  Please return in 6 months, or sooner if needed

## 2017-05-19 ENCOUNTER — Encounter: Payer: Self-pay | Admitting: Internal Medicine

## 2017-05-19 NOTE — Assessment & Plan Note (Signed)
D/w pt, counseled to quit but not ready, at least could change to [facility]

## 2017-05-19 NOTE — Assessment & Plan Note (Signed)
Asympt, for f/u with labs,  to f/u any worsening symptoms or concerns

## 2017-05-19 NOTE — Assessment & Plan Note (Signed)
Lab Results  Component Value Date   LDLCALC 94 05/16/2017  stable overall by history and exam, recent data reviewed with pt, and pt to continue medical treatment as before,  to f/u any worsening symptoms or concerns

## 2017-05-19 NOTE — Assessment & Plan Note (Signed)
stable overall by history and exam, recent data reviewed with pt, and pt to continue medical treatment as before,  to f/u any worsening symptoms or concerns BP Readings from Last 3 Encounters:  05/16/17 134/86  05/01/17 125/83  04/10/17 123/86

## 2017-05-22 ENCOUNTER — Inpatient Hospital Stay: Payer: Medicare Other

## 2017-05-22 ENCOUNTER — Telehealth: Payer: Self-pay | Admitting: Internal Medicine

## 2017-05-22 ENCOUNTER — Inpatient Hospital Stay (HOSPITAL_BASED_OUTPATIENT_CLINIC_OR_DEPARTMENT_OTHER): Payer: Medicare Other | Admitting: Internal Medicine

## 2017-05-22 ENCOUNTER — Telehealth: Payer: Self-pay

## 2017-05-22 ENCOUNTER — Other Ambulatory Visit: Payer: Medicare Other

## 2017-05-22 ENCOUNTER — Ambulatory Visit: Payer: Medicare Other

## 2017-05-22 ENCOUNTER — Encounter: Payer: Self-pay | Admitting: Internal Medicine

## 2017-05-22 ENCOUNTER — Ambulatory Visit: Payer: Medicare Other | Admitting: Internal Medicine

## 2017-05-22 VITALS — BP 106/65 | HR 91 | Temp 97.8°F | Resp 18 | Ht 75.0 in | Wt 129.9 lb

## 2017-05-22 DIAGNOSIS — R634 Abnormal weight loss: Secondary | ICD-10-CM | POA: Diagnosis not present

## 2017-05-22 DIAGNOSIS — Z7689 Persons encountering health services in other specified circumstances: Secondary | ICD-10-CM | POA: Diagnosis not present

## 2017-05-22 DIAGNOSIS — C3412 Malignant neoplasm of upper lobe, left bronchus or lung: Secondary | ICD-10-CM | POA: Diagnosis not present

## 2017-05-22 DIAGNOSIS — Z9221 Personal history of antineoplastic chemotherapy: Secondary | ICD-10-CM

## 2017-05-22 DIAGNOSIS — C3492 Malignant neoplasm of unspecified part of left bronchus or lung: Secondary | ICD-10-CM

## 2017-05-22 DIAGNOSIS — Z5111 Encounter for antineoplastic chemotherapy: Secondary | ICD-10-CM

## 2017-05-22 DIAGNOSIS — Z923 Personal history of irradiation: Secondary | ICD-10-CM | POA: Diagnosis not present

## 2017-05-22 LAB — COMPREHENSIVE METABOLIC PANEL
ALT: 14 U/L (ref 0–55)
AST: 22 U/L (ref 5–34)
Albumin: 3.5 g/dL (ref 3.5–5.0)
Alkaline Phosphatase: 92 U/L (ref 40–150)
Anion gap: 9 (ref 3–11)
BILIRUBIN TOTAL: 0.3 mg/dL (ref 0.2–1.2)
BUN: 25 mg/dL (ref 7–26)
CALCIUM: 9.7 mg/dL (ref 8.4–10.4)
CHLORIDE: 105 mmol/L (ref 98–109)
CO2: 25 mmol/L (ref 22–29)
CREATININE: 1.26 mg/dL (ref 0.70–1.30)
GFR, EST NON AFRICAN AMERICAN: 57 mL/min — AB (ref >60)
Glucose, Bld: 98 mg/dL (ref 70–140)
Potassium: 4.3 mmol/L (ref 3.5–5.1)
Sodium: 139 mmol/L (ref 136–145)
TOTAL PROTEIN: 7.9 g/dL (ref 6.4–8.3)

## 2017-05-22 LAB — CBC WITH DIFFERENTIAL/PLATELET
BASOS ABS: 0 10*3/uL (ref 0.0–0.1)
BASOS PCT: 1 %
EOS ABS: 0 10*3/uL (ref 0.0–0.5)
EOS PCT: 1 %
HCT: 34.6 % — ABNORMAL LOW (ref 38.4–49.9)
HEMOGLOBIN: 11.3 g/dL — AB (ref 13.0–17.1)
LYMPHS ABS: 0.5 10*3/uL — AB (ref 0.9–3.3)
Lymphocytes Relative: 16 %
MCH: 30.4 pg (ref 27.2–33.4)
MCHC: 32.8 g/dL (ref 32.0–36.0)
MCV: 92.8 fL (ref 79.3–98.0)
Monocytes Absolute: 0.5 10*3/uL (ref 0.1–0.9)
Monocytes Relative: 15 %
NEUTROS PCT: 67 %
Neutro Abs: 2.2 10*3/uL (ref 1.5–6.5)
PLATELETS: 294 10*3/uL (ref 140–400)
RBC: 3.73 MIL/uL — AB (ref 4.20–5.82)
RDW: 17.2 % — ABNORMAL HIGH (ref 11.0–14.6)
WBC: 3.3 10*3/uL — AB (ref 4.0–10.3)

## 2017-05-22 MED ORDER — PALONOSETRON HCL INJECTION 0.25 MG/5ML
0.2500 mg | Freq: Once | INTRAVENOUS | Status: AC
  Administered 2017-05-22: 0.25 mg via INTRAVENOUS

## 2017-05-22 MED ORDER — PACLITAXEL CHEMO INJECTION 300 MG/50ML
175.0000 mg/m2 | Freq: Once | INTRAVENOUS | Status: AC
  Administered 2017-05-22: 318 mg via INTRAVENOUS
  Filled 2017-05-22: qty 53

## 2017-05-22 MED ORDER — DEXAMETHASONE SODIUM PHOSPHATE 100 MG/10ML IJ SOLN
20.0000 mg | Freq: Once | INTRAMUSCULAR | Status: AC
  Administered 2017-05-22: 20 mg via INTRAVENOUS
  Filled 2017-05-22: qty 2

## 2017-05-22 MED ORDER — SODIUM CHLORIDE 0.9 % IV SOLN
20.0000 mg | Freq: Once | INTRAVENOUS | Status: AC
  Administered 2017-05-22: 20 mg via INTRAVENOUS
  Filled 2017-05-22: qty 2

## 2017-05-22 MED ORDER — SODIUM CHLORIDE 0.9 % IV SOLN
388.0000 mg | Freq: Once | INTRAVENOUS | Status: AC
  Administered 2017-05-22: 390 mg via INTRAVENOUS
  Filled 2017-05-22: qty 39

## 2017-05-22 MED ORDER — PEGFILGRASTIM 6 MG/0.6ML ~~LOC~~ PSKT
6.0000 mg | PREFILLED_SYRINGE | Freq: Once | SUBCUTANEOUS | Status: AC
  Administered 2017-05-22: 6 mg via SUBCUTANEOUS

## 2017-05-22 MED ORDER — COLD PACK MISC ONCOLOGY
1.0000 | Freq: Once | Status: DC | PRN
  Filled 2017-05-22: qty 1

## 2017-05-22 MED ORDER — PEGFILGRASTIM 6 MG/0.6ML ~~LOC~~ PSKT
PREFILLED_SYRINGE | SUBCUTANEOUS | Status: AC
  Filled 2017-05-22: qty 0.6

## 2017-05-22 MED ORDER — FAMOTIDINE IN NACL 20-0.9 MG/50ML-% IV SOLN: 20.0000 mg | Freq: Once | INTRAVENOUS | Status: DC

## 2017-05-22 MED ORDER — SODIUM CHLORIDE 0.9 % IV SOLN
Freq: Once | INTRAVENOUS | Status: AC
  Administered 2017-05-22: 11:00:00 via INTRAVENOUS

## 2017-05-22 MED ORDER — DIPHENHYDRAMINE HCL 50 MG/ML IJ SOLN
50.0000 mg | Freq: Once | INTRAMUSCULAR | Status: AC
  Administered 2017-05-22: 50 mg via INTRAVENOUS

## 2017-05-22 NOTE — Progress Notes (Signed)
[place] Telephone:([phone]) [phone]   [phone]) [phone]  OFFICE PROGRESS NOTE  [name], MD [place] [address]  DIAGNOSIS: Stage IIIa (T2b, N2, M0) non-small cell lung cancer, squamous cell carcinoma presented with left upper lobe lung mass in addition to AP window lymphadenopathy diagnosed in February 2018.  PRIOR THERAPY:  1) Concurrent chemoradiation therapy. He received radiation for approximately 2-3 weeks prior to starting his chemotherapy. He completed his radiation on 07/05/2016. He received 2 doses of weekly carboplatin for an AUC of 2 and paclitaxel 45 mg meter squared which was last given on 07/03/2016. 2) consolidation treatment with immunotherapy with Imfinzi (Durvalumab) 10 MG/KG every 2 weeks. First dose 08/22/2016. Status post 2 cycles.  Last dose was given September 19, 2016 discontinued secondary to intolerance and significant skin rash.  CURRENT THERAPY:  Systemic chemotherapy with carboplatin for AUC of 5 and paclitaxel 175 mg/M2 every 3 weeks.  First dose February 06, 2017.  Status post 4 cycles.  INTERVAL HISTORY: [name] 67 y.o. male returns to the clinic today for follow-up visit.  The patient is feeling fine today with no specific complaints.  He lost a few pounds since his last visit but he eats good.  He denied having any chest pain, shortness breath, cough or hemoptysis.  He has no nausea, vomiting, diarrhea or constipation.  He continues to tolerate his treatment with carboplatin and paclitaxel fairly well.  Unfortunately he continues to smoke on a daily basis and I strongly advised him to quit smoking.  He is here today for evaluation before starting cycle #5 of his treatment.  MEDICAL HISTORY: Past Medical History:  Diagnosis Date  . AAA (abdominal aortic aneurysm) ([place])   . Abdominal aortic aneurysm ([place])    AAA And bilateral common iliac artery aneurysms  . BPH (benign prostatic hyperplasia)   . CAD  (coronary artery disease)    Dr Ron Parker @ [facility]   . Chronic mental illness 04/21/2011   Diagnosis unclear,  Family provides good care  . COPD (chronic obstructive pulmonary disease) ([place]) 04/21/2011   smoking  . Drug-induced skin rash 09/05/2016  . Ejection fraction    EF 35-40%, echo, May, 2010  . Hyperlipidemia   . Hyperplastic colon polyp 04/21/2011  . Hypertension   . Preop cardiovascular exam    Cardiac clearance for major vascular surgery, May, 2013  . Pulmonary nodule    Chest CT May, 2013, 2 small pulmonary nodules, needs appropriate followup,  this CT was not ordered by the cardiology team  . PVD (peripheral vascular disease) ([place]) 04/21/2011  . Retention of urine   . Shortness of breath   . Squamous cell lung cancer, left ([place]) 06/13/2016  . Tobacco abuse     ALLERGIES:  is allergic to ace inhibitors.  MEDICATIONS:  Current Outpatient Medications  Medication Sig Dispense Refill  . aspirin EC 81 MG tablet Take 81 mg by mouth daily.    . ondansetron (ZOFRAN ODT) 4 MG disintegrating tablet Take 1 tablet (4 mg total) by mouth every 8 (eight) hours as needed for nausea or vomiting. 10 tablet 0  . prochlorperazine (COMPAZINE) 10 MG tablet Take 1 tablet (10 mg total) by mouth every 6 (six) hours as needed for nausea or vomiting. 30 tablet 0  . psyllium (METAMUCIL) 58.6 % packet Take 1 packet by mouth daily. 30 each 12  . rosuvastatin (CRESTOR) 40 MG tablet TAKE 1 TABLET BY  MOUTH EVERY DAY 90 tablet 0  . Sennosides (SENOKOT PO) Take 1 tablet by mouth daily as needed (CONSTIPATION).    [name] sucralfate (CARAFATE) 1 GM/10ML suspension Take 10 mLs (1 g total) by mouth 4 (four) times daily -  with meals and at bedtime. 420 mL 0   No current facility-administered medications for this visit.     SURGICAL HISTORY:  Past Surgical History:  Procedure Laterality Date  . ABDOMINAL AORTA STENT  07/2011  . ABDOMINAL AORTAGRAM N/A 07/26/2011   Procedure: ABDOMINAL Maxcine Ham;  Surgeon: Conrad Firebaugh, MD;   Location: [facility] CATH LAB;  Service: Cardiovascular;  Laterality: N/A;  . ABDOMINAL AORTIC ANEURYSM REPAIR  07/2011  . CORONARY ARTERY BYPASS GRAFT  07/2008   CABG X5/notes 07/12/2010  . EMBOLIZATION Left 07/26/2011   Procedure: EMBOLIZATION;  Surgeon: Conrad New Britain, MD;  Location: [facility] CATH LAB;  Service: Cardiovascular;  Laterality: Left;  . TRANSURETHRAL RESECTION OF PROSTATE  11/29/2011   Procedure: TRANSURETHRAL RESECTION OF THE PROSTATE WITH GYRUS INSTRUMENTS;  Surgeon: Malka So, MD;  Location: [facility] ORS;  Service: Urology;  Laterality: N/A;  Prostate Ultrasound, and Biopsy  . VIDEO BRONCHOSCOPY Bilateral 04/12/2016   Procedure: VIDEO BRONCHOSCOPY WITH FLUORO;  Surgeon: Juanito Doom, MD;  Location: [facility];  Service: Cardiopulmonary;  Laterality: Bilateral;    REVIEW OF SYSTEMS:  A comprehensive review of systems was negative except for: Constitutional: positive for fatigue and weight loss Respiratory: positive for dyspnea on exertion   PHYSICAL EXAMINATION: General appearance: alert, cooperative and no distress Head: Normocephalic, without obvious abnormality, atraumatic Neck: no adenopathy, no JVD, supple, symmetrical, trachea midline and thyroid not enlarged, symmetric, no tenderness/mass/nodules Lymph nodes: Cervical, supraclavicular, and axillary nodes normal. Resp: clear to auscultation bilaterally Back: symmetric, no curvature. ROM normal. No CVA tenderness. Cardio: regular rate and rhythm, S1, S2 normal, no murmur, click, rub or gallop GI: soft, non-tender; bowel sounds normal; no masses,  no organomegaly Extremities: extremities normal, atraumatic, no cyanosis or edema    ECOG PERFORMANCE STATUS: 1 - Symptomatic but completely ambulatory  Blood pressure 106/65, pulse 91, temperature 97.8 F (36.6 C), temperature source Oral, resp. rate 18, height 6\' 3"  (1.905 m), weight 129 lb 14.4 oz (58.9 kg), SpO2 90 %.  LABORATORY DATA: Lab Results  Component Value Date   WBC 3.3  (L) 05/22/2017   HGB 11.3 (L) 05/22/2017   HCT 34.6 (L) 05/22/2017   MCV 92.8 05/22/2017   PLT 294 05/22/2017      Chemistry      Component Value Date/Time   NA 142 05/15/2017 0902   NA 139 03/13/2017 1059   K 4.1 05/15/2017 0902   K 3.9 03/13/2017 1059   CL 108 05/15/2017 0902   CO2 26 05/15/2017 0902   CO2 26 03/13/2017 1059   BUN 13 05/15/2017 0902   BUN 18.6 03/13/2017 1059   CREATININE 1.03 05/15/2017 0902   CREATININE 1.2 03/13/2017 1059      Component Value Date/Time   CALCIUM 9.7 05/15/2017 0902   CALCIUM 8.9 03/13/2017 1059   ALKPHOS 86 05/15/2017 0902   ALKPHOS 118 03/13/2017 1059   AST 18 05/15/2017 0902   AST 16 03/13/2017 1059   ALT 15 05/15/2017 0902   ALT 14 03/13/2017 1059   BILITOT 0.3 05/15/2017 0902   BILITOT 0.36 03/13/2017 1059       RADIOGRAPHIC STUDIES: Ct Chest W Contrast  Result Date: 04/29/2017 CLINICAL DATA:  Squamous cell lung cancer.  Restaging. EXAM: CT  CHEST WITH CONTRAST TECHNIQUE: Multidetector CT imaging of the chest was performed during intravenous contrast administration. CONTRAST:  75 cc Isovue-300. COMPARISON:  Chest CT 01/21/2017 and 08/08/2016.  PET-CT 04/25/2016. FINDINGS: Cardiovascular: No acute vascular findings. There is atherosclerosis of the aorta, great vessels and coronary arteries status post median sternotomy and CABG. Abdominal aortic stent graft partially imaged. The heart size is normal. There is no pericardial effusion. Mediastinum/Nodes: Nodal assessment limited by paucity of mediastinal and subcutaneous fat. Left axillary nodularity appears stable, likely vascular. No enlarged mediastinal, hilar or axillary lymph nodes are seen. The thyroid gland, trachea and esophagus demonstrate no significant findings. Lungs/Pleura: There is no pleural effusion or pneumothorax. Again demonstrated is severe centrilobular and paraseptal emphysema. There is stable volume loss at the left apex. The air-containing, cavitary apical  component of the left upper lobe lesion is slightly smaller. The more inferior suprahilar component demonstrates further enlargement, measuring 2.4 x 1.8 cm on image 38 (previously 2.0 x 1.8 cm). This demonstrates central low density consistent with necrosis. 5 mm left upper lobe nodule peripherally on image 42 is stable. The left lower lobe nodule measuring 5 mm on image 91 is also stable. Upper abdomen:  Stable without acute findings. Musculoskeletal/Chest wall: There is no chest wall mass or suspicious osseous finding. Diffuse paucity of subcutaneous fat. IMPRESSION: 1. Further enlargement of necrotic appearing left suprahilar nodule, potentially disease progression versus sequela of prior therapy. Consider PET-CT for further evaluation. The more superior apical component is smaller. 2. No other enlarging pulmonary nodules or progressive adenopathy. 3. Aortic Atherosclerosis (ICD10-I70.0) and Emphysema (ICD10-J43.9). Electronically Signed   By: Richardean Sale M.D.   On: 04/29/2017 15:24    ASSESSMENT AND PLAN:  This is a very pleasant 67 years old African-American male with unresectable a stage IIIa non-small cell lung cancer, status post a course of concurrent chemoradiation with weekly carboplatin and paclitaxel and tolerated the treatment well. His scan showed no evidence for disease progression and the patient was started on consolidation treatment with immunotherapy with Imfinzi (Durvalumab) status post 2 cycles.  This treatment was discontinued secondary to a significant skin rash treated with steroids with recurrence after the patient was resumed again. He has been on observation for the last few months. The patient is feeling fine. Repeat CT scan of the chest showed improvement of the primary apical left upper lobe cavitary mass but there was development of new 2.0 cm solid pulmonary nodules at the medial central margin of the primary mass in addition to another new 0.5 cm left upper lobe  pulmonary nodule.  These findings are suspicious for metastatic disease. The patient was started on systemic chemotherapy with carboplatin and paclitaxel status post 4 cycles. The patient has been tolerating this treatment fairly well.  I recommended for him to proceed with cycle #5 today as a scheduled. He will come back for follow-up visit in 3 weeks for evaluation before starting the next cycle of his treatment. The patient was advised to call immediately if he has any concerning symptoms in the interval. The patient voices understanding of current disease status and treatment options and is in agreement with the current care plan. All questions were answered. The patient knows to call the clinic with any problems, questions or concerns. We can certainly see the patient much sooner if necessary.   Disclaimer: This note was dictated with voice recognition software. Similar sounding words can inadvertently be transcribed and may not be corrected upon review.

## 2017-05-22 NOTE — Telephone Encounter (Signed)
Appts already scheduled per 3/13 los - gave patient AVS and calender per los.

## 2017-05-22 NOTE — Patient Instructions (Signed)
[facility] Discharge Instructions for Patients Receiving Chemotherapy  Today you received the following chemotherapy agents Taxol, carboplatin  To help prevent nausea and vomiting after your treatment, we encourage you to take your nausea medication as directed If you develop nausea and vomiting that is not controlled by your nausea medication, call the clinic.   BELOW ARE SYMPTOMS THAT SHOULD BE REPORTED IMMEDIATELY:  *FEVER GREATER THAN 100.5 F  *CHILLS WITH OR WITHOUT FEVER  NAUSEA AND VOMITING THAT IS NOT CONTROLLED WITH YOUR NAUSEA MEDICATION  *UNUSUAL SHORTNESS OF BREATH  *UNUSUAL BRUISING OR BLEEDING  TENDERNESS IN MOUTH AND THROAT WITH OR WITHOUT PRESENCE OF ULCERS  *URINARY PROBLEMS  *BOWEL PROBLEMS  UNUSUAL RASH Items with * indicate a potential emergency and should be followed up as soon as possible.  Feel free to call the clinic should you have any questions or concerns. The clinic phone number is [phone].  Please show the [facility] at check-in to the Emergency Department and triage nurse.

## 2017-05-22 NOTE — Telephone Encounter (Signed)
Patient appointment was canceled for today on yesterday. RN. Asked to place patient back on schedule. Per 3/13 los canceled off dare

## 2017-05-22 NOTE — Progress Notes (Signed)
Called [phone] [phone] to pick up the pt at 5 pm  They said they are going to pick him up. Pt was assisted to wait at the lobby.

## 2017-05-24 ENCOUNTER — Ambulatory Visit: Payer: Medicare Other

## 2017-05-29 ENCOUNTER — Inpatient Hospital Stay: Payer: Medicare Other

## 2017-05-29 DIAGNOSIS — Z923 Personal history of irradiation: Secondary | ICD-10-CM | POA: Diagnosis not present

## 2017-05-29 DIAGNOSIS — C3412 Malignant neoplasm of upper lobe, left bronchus or lung: Secondary | ICD-10-CM | POA: Diagnosis not present

## 2017-05-29 DIAGNOSIS — R634 Abnormal weight loss: Secondary | ICD-10-CM | POA: Diagnosis not present

## 2017-05-29 DIAGNOSIS — Z5111 Encounter for antineoplastic chemotherapy: Secondary | ICD-10-CM | POA: Diagnosis not present

## 2017-05-29 DIAGNOSIS — C3492 Malignant neoplasm of unspecified part of left bronchus or lung: Secondary | ICD-10-CM

## 2017-05-29 DIAGNOSIS — Z9221 Personal history of antineoplastic chemotherapy: Secondary | ICD-10-CM | POA: Diagnosis not present

## 2017-05-29 DIAGNOSIS — Z7689 Persons encountering health services in other specified circumstances: Secondary | ICD-10-CM | POA: Diagnosis not present

## 2017-05-29 LAB — CBC WITH DIFFERENTIAL/PLATELET
Basophils Absolute: 0.1 10*3/uL (ref 0.0–0.1)
Basophils Relative: 1 %
Eosinophils Absolute: 0.2 10*3/uL (ref 0.0–0.5)
Eosinophils Relative: 1 %
HEMATOCRIT: 35.6 % — AB (ref 38.4–49.9)
HEMOGLOBIN: 11.7 g/dL — AB (ref 13.0–17.1)
LYMPHS ABS: 0.7 10*3/uL — AB (ref 0.9–3.3)
Lymphocytes Relative: 6 %
MCH: 30.4 pg (ref 27.2–33.4)
MCHC: 33 g/dL (ref 32.0–36.0)
MCV: 92.3 fL (ref 79.3–98.0)
MONO ABS: 1 10*3/uL — AB (ref 0.1–0.9)
MONOS PCT: 8 %
NEUTROS ABS: 9.8 10*3/uL — AB (ref 1.5–6.5)
NEUTROS PCT: 84 %
Platelets: 183 10*3/uL (ref 140–400)
RBC: 3.85 MIL/uL — ABNORMAL LOW (ref 4.20–5.82)
RDW: 17.4 % — AB (ref 11.0–14.6)
WBC: 11.8 10*3/uL — ABNORMAL HIGH (ref 4.0–10.3)

## 2017-05-29 LAB — COMPREHENSIVE METABOLIC PANEL
ALBUMIN: 3.7 g/dL (ref 3.5–5.0)
ALK PHOS: 129 U/L (ref 40–150)
ALT: 14 U/L (ref 0–55)
ANION GAP: 10 (ref 3–11)
AST: 17 U/L (ref 5–34)
BUN: 21 mg/dL (ref 7–26)
CHLORIDE: 103 mmol/L (ref 98–109)
CO2: 26 mmol/L (ref 22–29)
CREATININE: 1.32 mg/dL — AB (ref 0.70–1.30)
Calcium: 10.2 mg/dL (ref 8.4–10.4)
GFR calc Af Amer: >60 mL/min (ref >60)
GFR calc non Af Amer: 53 mL/min — ABNORMAL LOW (ref >60)
GLUCOSE: 102 mg/dL (ref 70–140)
Potassium: 4.3 mmol/L (ref 3.5–5.1)
SODIUM: 139 mmol/L (ref 136–145)
Total Bilirubin: 0.3 mg/dL (ref 0.2–1.2)
Total Protein: 7.9 g/dL (ref 6.4–8.3)

## 2017-06-05 ENCOUNTER — Inpatient Hospital Stay: Payer: Medicare Other

## 2017-06-05 DIAGNOSIS — C3412 Malignant neoplasm of upper lobe, left bronchus or lung: Secondary | ICD-10-CM | POA: Diagnosis not present

## 2017-06-05 DIAGNOSIS — Z9221 Personal history of antineoplastic chemotherapy: Secondary | ICD-10-CM | POA: Diagnosis not present

## 2017-06-05 DIAGNOSIS — Z5111 Encounter for antineoplastic chemotherapy: Secondary | ICD-10-CM | POA: Diagnosis not present

## 2017-06-05 DIAGNOSIS — R634 Abnormal weight loss: Secondary | ICD-10-CM | POA: Diagnosis not present

## 2017-06-05 DIAGNOSIS — Z923 Personal history of irradiation: Secondary | ICD-10-CM | POA: Diagnosis not present

## 2017-06-05 DIAGNOSIS — Z7689 Persons encountering health services in other specified circumstances: Secondary | ICD-10-CM | POA: Diagnosis not present

## 2017-06-05 DIAGNOSIS — C3492 Malignant neoplasm of unspecified part of left bronchus or lung: Secondary | ICD-10-CM

## 2017-06-05 LAB — COMPREHENSIVE METABOLIC PANEL
ALT: 11 U/L (ref 0–55)
ANION GAP: 8 (ref 3–11)
AST: 16 U/L (ref 5–34)
Albumin: 3.5 g/dL (ref 3.5–5.0)
Alkaline Phosphatase: 120 U/L (ref 40–150)
BUN: 23 mg/dL (ref 7–26)
CO2: 25 mmol/L (ref 22–29)
Calcium: 9.9 mg/dL (ref 8.4–10.4)
Chloride: 101 mmol/L (ref 98–109)
Creatinine, Ser: 1.21 mg/dL (ref 0.70–1.30)
GFR, EST NON AFRICAN AMERICAN: 59 mL/min — AB (ref >60)
Glucose, Bld: 97 mg/dL (ref 70–140)
POTASSIUM: 4.6 mmol/L (ref 3.5–5.1)
Sodium: 134 mmol/L — ABNORMAL LOW (ref 136–145)
Total Bilirubin: 0.3 mg/dL (ref 0.2–1.2)
Total Protein: 7.8 g/dL (ref 6.4–8.3)

## 2017-06-05 LAB — CBC WITH DIFFERENTIAL/PLATELET
BASOS ABS: 0 10*3/uL (ref 0.0–0.1)
Basophils Relative: 0 %
EOS PCT: 1 %
Eosinophils Absolute: 0.1 10*3/uL (ref 0.0–0.5)
HCT: 36.4 % — ABNORMAL LOW (ref 38.4–49.9)
HEMOGLOBIN: 11.7 g/dL — AB (ref 13.0–17.1)
LYMPHS PCT: 9 %
Lymphs Abs: 1 10*3/uL (ref 0.9–3.3)
MCH: 30.2 pg (ref 27.2–33.4)
MCHC: 32.1 g/dL (ref 32.0–36.0)
MCV: 94.1 fL (ref 79.3–98.0)
Monocytes Absolute: 0.7 10*3/uL (ref 0.1–0.9)
Monocytes Relative: 7 %
NEUTROS ABS: 9 10*3/uL — AB (ref 1.5–6.5)
NEUTROS PCT: 83 %
PLATELETS: 220 10*3/uL (ref 140–400)
RBC: 3.87 MIL/uL — AB (ref 4.20–5.82)
RDW: 16 % — ABNORMAL HIGH (ref 11.0–14.6)
WBC: 10.8 10*3/uL — AB (ref 4.0–10.3)

## 2017-06-12 ENCOUNTER — Encounter: Payer: Self-pay | Admitting: Oncology

## 2017-06-12 ENCOUNTER — Inpatient Hospital Stay: Payer: Medicare Other

## 2017-06-12 ENCOUNTER — Telehealth: Payer: Self-pay

## 2017-06-12 ENCOUNTER — Inpatient Hospital Stay: Payer: Medicare Other | Attending: Internal Medicine

## 2017-06-12 ENCOUNTER — Inpatient Hospital Stay (HOSPITAL_BASED_OUTPATIENT_CLINIC_OR_DEPARTMENT_OTHER): Payer: Medicare Other | Admitting: Oncology

## 2017-06-12 VITALS — BP 125/77 | HR 78 | Temp 97.6°F | Resp 18 | Ht 75.0 in | Wt 127.0 lb

## 2017-06-12 DIAGNOSIS — Z9221 Personal history of antineoplastic chemotherapy: Secondary | ICD-10-CM | POA: Diagnosis not present

## 2017-06-12 DIAGNOSIS — C3412 Malignant neoplasm of upper lobe, left bronchus or lung: Secondary | ICD-10-CM | POA: Diagnosis not present

## 2017-06-12 DIAGNOSIS — R918 Other nonspecific abnormal finding of lung field: Secondary | ICD-10-CM | POA: Diagnosis not present

## 2017-06-12 DIAGNOSIS — Z5111 Encounter for antineoplastic chemotherapy: Secondary | ICD-10-CM | POA: Insufficient documentation

## 2017-06-12 DIAGNOSIS — C3490 Malignant neoplasm of unspecified part of unspecified bronchus or lung: Secondary | ICD-10-CM

## 2017-06-12 DIAGNOSIS — Z5189 Encounter for other specified aftercare: Secondary | ICD-10-CM | POA: Insufficient documentation

## 2017-06-12 DIAGNOSIS — Z923 Personal history of irradiation: Secondary | ICD-10-CM | POA: Diagnosis not present

## 2017-06-12 DIAGNOSIS — C3492 Malignant neoplasm of unspecified part of left bronchus or lung: Secondary | ICD-10-CM | POA: Diagnosis not present

## 2017-06-12 LAB — CBC WITH DIFFERENTIAL/PLATELET
BASOS PCT: 0 %
Basophils Absolute: 0 10*3/uL (ref 0.0–0.1)
EOS PCT: 1 %
Eosinophils Absolute: 0.1 10*3/uL (ref 0.0–0.5)
HEMATOCRIT: 35.4 % — AB (ref 38.4–49.9)
Hemoglobin: 11.2 g/dL — ABNORMAL LOW (ref 13.0–17.1)
Lymphocytes Relative: 12 %
Lymphs Abs: 0.8 10*3/uL — ABNORMAL LOW (ref 0.9–3.3)
MCH: 30 pg (ref 27.2–33.4)
MCHC: 31.6 g/dL — AB (ref 32.0–36.0)
MCV: 94.9 fL (ref 79.3–98.0)
MONO ABS: 0.6 10*3/uL (ref 0.1–0.9)
MONOS PCT: 10 %
NEUTROS ABS: 4.9 10*3/uL (ref 1.5–6.5)
Neutrophils Relative %: 77 %
Platelets: 260 10*3/uL (ref 140–400)
RBC: 3.73 MIL/uL — ABNORMAL LOW (ref 4.20–5.82)
RDW: 16 % — AB (ref 11.0–14.6)
WBC: 6.3 10*3/uL (ref 4.0–10.3)

## 2017-06-12 LAB — COMPREHENSIVE METABOLIC PANEL
ALT: 11 U/L (ref 0–55)
AST: 14 U/L (ref 5–34)
Albumin: 3.3 g/dL — ABNORMAL LOW (ref 3.5–5.0)
Alkaline Phosphatase: 91 U/L (ref 40–150)
Anion gap: 10 (ref 3–11)
BILIRUBIN TOTAL: 0.3 mg/dL (ref 0.2–1.2)
BUN: 21 mg/dL (ref 7–26)
CO2: 23 mmol/L (ref 22–29)
Calcium: 9.4 mg/dL (ref 8.4–10.4)
Chloride: 107 mmol/L (ref 98–109)
Creatinine, Ser: 1.09 mg/dL (ref 0.70–1.30)
GFR calc Af Amer: >60 mL/min (ref >60)
Glucose, Bld: 83 mg/dL (ref 70–140)
POTASSIUM: 3.8 mmol/L (ref 3.5–5.1)
Sodium: 140 mmol/L (ref 136–145)
TOTAL PROTEIN: 7.5 g/dL (ref 6.4–8.3)

## 2017-06-12 MED ORDER — SODIUM CHLORIDE 0.9 % IV SOLN
Freq: Once | INTRAVENOUS | Status: AC
  Administered 2017-06-12: 10:00:00 via INTRAVENOUS

## 2017-06-12 MED ORDER — DIPHENHYDRAMINE HCL 50 MG/ML IJ SOLN
INTRAMUSCULAR | Status: AC
  Filled 2017-06-12: qty 1

## 2017-06-12 MED ORDER — DIPHENHYDRAMINE HCL 50 MG/ML IJ SOLN
50.0000 mg | Freq: Once | INTRAMUSCULAR | Status: AC
  Administered 2017-06-12: 50 mg via INTRAVENOUS

## 2017-06-12 MED ORDER — DEXAMETHASONE SODIUM PHOSPHATE 100 MG/10ML IJ SOLN
20.0000 mg | Freq: Once | INTRAMUSCULAR | Status: AC
  Administered 2017-06-12: 20 mg via INTRAVENOUS
  Filled 2017-06-12: qty 2

## 2017-06-12 MED ORDER — PEGFILGRASTIM 6 MG/0.6ML ~~LOC~~ PSKT
PREFILLED_SYRINGE | SUBCUTANEOUS | Status: AC
  Filled 2017-06-12: qty 0.6

## 2017-06-12 MED ORDER — FAMOTIDINE IN NACL 20-0.9 MG/50ML-% IV SOLN
INTRAVENOUS | Status: AC
  Filled 2017-06-12: qty 50

## 2017-06-12 MED ORDER — PALONOSETRON HCL INJECTION 0.25 MG/5ML
INTRAVENOUS | Status: AC
  Filled 2017-06-12: qty 5

## 2017-06-12 MED ORDER — SODIUM CHLORIDE 0.9 % IV SOLN
175.0000 mg/m2 | Freq: Once | INTRAVENOUS | Status: AC
  Administered 2017-06-12: 318 mg via INTRAVENOUS
  Filled 2017-06-12: qty 53

## 2017-06-12 MED ORDER — PALONOSETRON HCL INJECTION 0.25 MG/5ML
0.2500 mg | Freq: Once | INTRAVENOUS | Status: AC
  Administered 2017-06-12: 0.25 mg via INTRAVENOUS

## 2017-06-12 MED ORDER — PEGFILGRASTIM 6 MG/0.6ML ~~LOC~~ PSKT
6.0000 mg | PREFILLED_SYRINGE | Freq: Once | SUBCUTANEOUS | Status: AC
  Administered 2017-06-12: 6 mg via SUBCUTANEOUS

## 2017-06-12 MED ORDER — FAMOTIDINE IN NACL 20-0.9 MG/50ML-% IV SOLN
20.0000 mg | Freq: Once | INTRAVENOUS | Status: AC
  Administered 2017-06-12: 20 mg via INTRAVENOUS

## 2017-06-12 MED ORDER — SODIUM CHLORIDE 0.9 % IV SOLN
388.0000 mg | Freq: Once | INTRAVENOUS | Status: AC
  Administered 2017-06-12: 390 mg via INTRAVENOUS
  Filled 2017-06-12: qty 39

## 2017-06-12 NOTE — Patient Instructions (Signed)
[facility] Discharge Instructions for Patients Receiving Chemotherapy  Today you received the following chemotherapy agents Taxol, carboplatin  To help prevent nausea and vomiting after your treatment, we encourage you to take your nausea medication as directed If you develop nausea and vomiting that is not controlled by your nausea medication, call the clinic.   BELOW ARE SYMPTOMS THAT SHOULD BE REPORTED IMMEDIATELY:  *FEVER GREATER THAN 100.5 F  *CHILLS WITH OR WITHOUT FEVER  NAUSEA AND VOMITING THAT IS NOT CONTROLLED WITH YOUR NAUSEA MEDICATION  *UNUSUAL SHORTNESS OF BREATH  *UNUSUAL BRUISING OR BLEEDING  TENDERNESS IN MOUTH AND THROAT WITH OR WITHOUT PRESENCE OF ULCERS  *URINARY PROBLEMS  *BOWEL PROBLEMS  UNUSUAL RASH Items with * indicate a potential emergency and should be followed up as soon as possible.  Feel free to call the clinic should you have any questions or concerns. The clinic phone number is [phone].  Please show the [facility] at check-in to the Emergency Department and triage nurse.

## 2017-06-12 NOTE — Telephone Encounter (Signed)
Printed avs and calender of upcoming appointment. Per 4/3 los

## 2017-06-12 NOTE — Assessment & Plan Note (Signed)
This is a very pleasant 67 year old African-American male with unresectable a stage IIIa non-small cell lung cancer, status post a course of concurrent chemoradiation with weekly carboplatin and paclitaxel and tolerated the treatment well. His scan showed no evidence for disease progression and the patient was started on consolidation treatment with immunotherapy with Imfinzi (Durvalumab) status post 2 cycles.  This treatment was discontinued secondary to a significant skin rash treated with steroids with recurrence after the patient was resumed again. He has was then on observation for several few months. Repeat CT scan of the chest showed improvement of the primary apical left upper lobe cavitary mass but there was development of new 2.0 cm solid pulmonary nodules at the medial central margin of the primary mass in addition to another new 0.5 cm left upper lobe pulmonary nodule.  These findings are suspicious for metastatic disease. The patient was started on systemic chemotherapy with carboplatin and paclitaxel status post 5 cycles. The patient has been tolerating this treatment fairly well.  I recommended for him to proceed with cycle #6 today as a scheduled. The patient will have a restaging CT scan of the chest in approximately 3 weeks.  He will follow-up 1-2 days after the scan to discuss the results and further treatment options.  The patient was advised to call immediately if he has any concerning symptoms in the interval. The patient voices understanding of current disease status and treatment options and is in agreement with the current care plan. All questions were answered. The patient knows to call the clinic with any problems, questions or concerns. We can certainly see the patient much sooner if necessary.

## 2017-06-12 NOTE — Progress Notes (Signed)
[facility] OFFICE PROGRESS NOTE  [name], MD [place] [address]  DIAGNOSIS: Stage IIIa (T2b, N2, M0) non-small cell lung cancer, squamous cell carcinoma presented with left upper lobe lung mass in addition to AP window lymphadenopathy diagnosed in February 2018.  PRIOR THERAPY: 1) Concurrent chemoradiation therapy. He received radiation for approximately 2-3 weeks prior to starting his chemotherapy. He completed his radiation on 07/05/2016. He received 2 doses of weekly carboplatin for an AUC of 2 and paclitaxel 45 mg meter squared which was last given on 07/03/2016. 2) consolidation treatment with immunotherapy with Imfinzi (Durvalumab) 10 MG/KG every 2 weeks. First dose 08/22/2016. Status post 2 cycles.  Last dose was given September 19, 2016 discontinued secondary to intolerance and significant skin rash.  CURRENT THERAPY: Systemic chemotherapy with carboplatin for AUC of 5 and paclitaxel 175 mg/M2 every 3 weeks.  First dose February 06, 2017.  Status post 5 cycles.  INTERVAL HISTORY: [name] 67 y.o. male returns for routine follow-up visit by himself.  The patient is feeling fine today with no specific complaints.  He denies fevers and chills.  Denies chest pain, shortness breath, cough, hemoptysis.  Denies nausea, vomiting, constipation, diarrhea.  Denies weight loss or night sweats.  The patient is here for evaluation prior to cycle #6 of his treatment.  MEDICAL HISTORY: Past Medical History:  Diagnosis Date  . AAA (abdominal aortic aneurysm) ([place])   . Abdominal aortic aneurysm ([place])    AAA And bilateral common iliac artery aneurysms  . BPH (benign prostatic hyperplasia)   . CAD (coronary artery disease)    Dr Ron Parker @ [facility]   . Chronic mental illness 04/21/2011   Diagnosis unclear,  Family provides good care  . COPD (chronic obstructive pulmonary disease) ([place]) 04/21/2011   smoking  . Drug-induced skin rash 09/05/2016  . Ejection  fraction    EF 35-40%, echo, May, 2010  . Hyperlipidemia   . Hyperplastic colon polyp 04/21/2011  . Hypertension   . Preop cardiovascular exam    Cardiac clearance for major vascular surgery, May, 2013  . Pulmonary nodule    Chest CT May, 2013, 2 small pulmonary nodules, needs appropriate followup,  this CT was not ordered by the cardiology team  . PVD (peripheral vascular disease) ([place]) 04/21/2011  . Retention of urine   . Shortness of breath   . Squamous cell lung cancer, left ([place]) 06/13/2016  . Tobacco abuse     ALLERGIES:  is allergic to ace inhibitors.  MEDICATIONS:  Current Outpatient Medications  Medication Sig Dispense Refill  . aspirin EC 81 MG tablet Take 81 mg by mouth daily.    . ondansetron (ZOFRAN ODT) 4 MG disintegrating tablet Take 1 tablet (4 mg total) by mouth every 8 (eight) hours as needed for nausea or vomiting. 10 tablet 0  . prochlorperazine (COMPAZINE) 10 MG tablet Take 1 tablet (10 mg total) by mouth every 6 (six) hours as needed for nausea or vomiting. 30 tablet 0  . psyllium (METAMUCIL) 58.6 % packet Take 1 packet by mouth daily. 30 each 12  . rosuvastatin (CRESTOR) 40 MG tablet TAKE 1 TABLET BY MOUTH EVERY DAY 90 tablet 0  . Sennosides (SENOKOT PO) Take 1 tablet by mouth daily as needed (CONSTIPATION).    [name] sucralfate (CARAFATE) 1 GM/10ML suspension Take 10 mLs (1 g total) by mouth 4 (four) times daily -  with meals and at bedtime. 420 mL 0  No current facility-administered medications for this visit.    Facility-Administered Medications Ordered in Other Visits  Medication Dose Route Frequency Provider Last Rate Last Dose  . CARBOplatin (PARAPLATIN) 390 mg in sodium chloride 0.9 % 250 mL chemo infusion  390 mg Intravenous Once Curt Bears, MD      . PACLitaxel (TAXOL) 318 mg in sodium chloride 0.9 % 500 mL chemo infusion (> '80mg'$ /m2)  175 mg/m2 (Treatment Plan Recorded) Intravenous Once Curt Bears, MD 184 mL/hr at 06/12/17 1226 318 mg at 06/12/17  1226  . pegfilgrastim (NEULASTA ONPRO KIT) injection 6 mg  6 mg Subcutaneous Once Curt Bears, MD        SURGICAL HISTORY:  Past Surgical History:  Procedure Laterality Date  . ABDOMINAL AORTA STENT  07/2011  . ABDOMINAL AORTAGRAM N/A 07/26/2011   Procedure: ABDOMINAL Maxcine Ham;  Surgeon: Conrad Mason, MD;  Location: [facility] CATH LAB;  Service: Cardiovascular;  Laterality: N/A;  . ABDOMINAL AORTIC ANEURYSM REPAIR  07/2011  . CORONARY ARTERY BYPASS GRAFT  07/2008   CABG X5/notes 07/12/2010  . EMBOLIZATION Left 07/26/2011   Procedure: EMBOLIZATION;  Surgeon: Conrad [place], MD;  Location: [facility] CATH LAB;  Service: Cardiovascular;  Laterality: Left;  . TRANSURETHRAL RESECTION OF PROSTATE  11/29/2011   Procedure: TRANSURETHRAL RESECTION OF THE PROSTATE WITH GYRUS INSTRUMENTS;  Surgeon: Malka So, MD;  Location: [facility] ORS;  Service: Urology;  Laterality: N/A;  Prostate Ultrasound, and Biopsy  . VIDEO BRONCHOSCOPY Bilateral 04/12/2016   Procedure: VIDEO BRONCHOSCOPY WITH FLUORO;  Surgeon: Juanito Doom, MD;  Location: [facility];  Service: Cardiopulmonary;  Laterality: Bilateral;    REVIEW OF SYSTEMS:   Review of Systems  Constitutional: Negative for appetite change, chills, fatigue, fever and unexpected weight change.  HENT:   Negative for mouth sores, nosebleeds, sore throat and trouble swallowing.   Eyes: Negative for eye problems and icterus.  Respiratory: Negative for cough, hemoptysis, shortness of breath and wheezing.   Cardiovascular: Negative for chest pain and leg swelling.  Gastrointestinal: Negative for abdominal pain, constipation, diarrhea, nausea and vomiting.  Genitourinary: Negative for bladder incontinence, difficulty urinating, dysuria, frequency and hematuria.   Musculoskeletal: Negative for back pain, gait problem, neck pain and neck stiffness.  Skin: Negative for itching and rash.  Neurological: Negative for dizziness, extremity weakness, gait problem, headaches,  light-headedness and seizures.  Hematological: Negative for adenopathy. Does not bruise/bleed easily.  Psychiatric/Behavioral: Negative for confusion, depression and sleep disturbance. The patient is not nervous/anxious.     PHYSICAL EXAMINATION:  Blood pressure 125/77, pulse 78, temperature 97.6 F (36.4 C), temperature source Oral, resp. rate 18, height '6\' 3"'$  (1.905 m), weight 127 lb (57.6 kg), SpO2 100 %.  ECOG PERFORMANCE STATUS: 1 - Symptomatic but completely ambulatory  Physical Exam  Constitutional: Oriented to person, place, and time and well-developed, well-nourished, and in no distress. No distress.  HENT:  Head: Normocephalic and atraumatic.  Mouth/Throat: Oropharynx is clear and moist. No oropharyngeal exudate.  Eyes: Conjunctivae are normal. Right eye exhibits no discharge. Left eye exhibits no discharge. No scleral icterus.  Neck: Normal range of motion. Neck supple.  Cardiovascular: Normal rate, regular rhythm, normal heart sounds and intact distal pulses.   Pulmonary/Chest: Effort normal and breath sounds normal. No respiratory distress. No wheezes. No rales.  Abdominal: Soft. Bowel sounds are normal. Exhibits no distension and no mass. There is no tenderness.  Musculoskeletal: Normal range of motion. Exhibits no edema.  Lymphadenopathy:    No cervical adenopathy.  Neurological:  Alert and oriented to person, place, and time. Exhibits normal muscle tone. Gait normal. Coordination normal.  Skin: Skin is warm and dry. No rash noted. Not diaphoretic. No erythema. No pallor.  Psychiatric: Mood, memory and judgment normal.  Vitals reviewed.  LABORATORY DATA: Lab Results  Component Value Date   WBC 6.3 06/12/2017   HGB 11.2 (L) 06/12/2017   HCT 35.4 (L) 06/12/2017   MCV 94.9 06/12/2017   PLT 260 06/12/2017      Chemistry      Component Value Date/Time   NA 140 06/12/2017 0813   NA 139 03/13/2017 1059   K 3.8 06/12/2017 0813   K 3.9 03/13/2017 1059   CL 107  06/12/2017 0813   CO2 23 06/12/2017 0813   CO2 26 03/13/2017 1059   BUN 21 06/12/2017 0813   BUN 18.6 03/13/2017 1059   CREATININE 1.09 06/12/2017 0813   CREATININE 1.2 03/13/2017 1059      Component Value Date/Time   CALCIUM 9.4 06/12/2017 0813   CALCIUM 8.9 03/13/2017 1059   ALKPHOS 91 06/12/2017 0813   ALKPHOS 118 03/13/2017 1059   AST 14 06/12/2017 0813   AST 16 03/13/2017 1059   ALT 11 06/12/2017 0813   ALT 14 03/13/2017 1059   BILITOT 0.3 06/12/2017 0813   BILITOT 0.36 03/13/2017 1059       RADIOGRAPHIC STUDIES:  No results found.   ASSESSMENT/PLAN:  Squamous cell lung cancer, left ([place]) This is a very pleasant 66 year old African-American male with unresectable a stage IIIa non-small cell lung cancer, status post a course of concurrent chemoradiation with weekly carboplatin and paclitaxel and tolerated the treatment well. His scan showed no evidence for disease progression and the patient was started on consolidation treatment with immunotherapy with Imfinzi (Durvalumab) status post 2 cycles.  This treatment was discontinued secondary to a significant skin rash treated with steroids with recurrence after the patient was resumed again. He has was then on observation for several few months. Repeat CT scan of the chest showed improvement of the primary apical left upper lobe cavitary mass but there was development of new 2.0 cm solid pulmonary nodules at the medial central margin of the primary mass in addition to another new 0.5 cm left upper lobe pulmonary nodule.  These findings are suspicious for metastatic disease. The patient was started on systemic chemotherapy with carboplatin and paclitaxel status post 5 cycles. The patient has been tolerating this treatment fairly well.  I recommended for him to proceed with cycle #6 today as a scheduled. The patient will have a restaging CT scan of the chest in approximately 3 weeks.  He will follow-up 1-2 days after the scan to  discuss the results and further treatment options.  The patient was advised to call immediately if he has any concerning symptoms in the interval. The patient voices understanding of current disease status and treatment options and is in agreement with the current care plan. All questions were answered. The patient knows to call the clinic with any problems, questions or concerns. We can certainly see the patient much sooner if necessary.   Orders Placed This Encounter  Procedures  . CT CHEST W CONTRAST    Standing Status:   Future    Standing Expiration Date:   06/13/2018    Order Specific Question:   If indicated for the ordered procedure, I authorize the administration of contrast media per Radiology protocol    Answer:   Yes    Order  Specific Question:   Preferred imaging location?    Answer:   [facility]    Order Specific Question:   Radiology Contrast Protocol - do NOT remove file path    Answer:   [email or web address]    Order Specific Question:   Reason for Exam additional comments    Answer:   Lung cancer. Restaging.   Mikey Bussing, DNP, AGPCNP-BC, AOCNP 06/12/17

## 2017-06-18 ENCOUNTER — Ambulatory Visit: Admission: RE | Admit: 2017-06-18 | Payer: Medicare Other | Source: Ambulatory Visit

## 2017-06-19 ENCOUNTER — Inpatient Hospital Stay: Payer: Medicare Other

## 2017-06-19 DIAGNOSIS — Z5111 Encounter for antineoplastic chemotherapy: Secondary | ICD-10-CM | POA: Diagnosis not present

## 2017-06-19 DIAGNOSIS — C3412 Malignant neoplasm of upper lobe, left bronchus or lung: Secondary | ICD-10-CM | POA: Diagnosis not present

## 2017-06-19 DIAGNOSIS — C3492 Malignant neoplasm of unspecified part of left bronchus or lung: Secondary | ICD-10-CM

## 2017-06-19 DIAGNOSIS — Z5189 Encounter for other specified aftercare: Secondary | ICD-10-CM | POA: Diagnosis not present

## 2017-06-19 DIAGNOSIS — Z9221 Personal history of antineoplastic chemotherapy: Secondary | ICD-10-CM | POA: Diagnosis not present

## 2017-06-19 DIAGNOSIS — Z923 Personal history of irradiation: Secondary | ICD-10-CM | POA: Diagnosis not present

## 2017-06-19 DIAGNOSIS — R918 Other nonspecific abnormal finding of lung field: Secondary | ICD-10-CM | POA: Diagnosis not present

## 2017-06-19 LAB — CBC WITH DIFFERENTIAL/PLATELET
Basophils Absolute: 0 10*3/uL (ref 0.0–0.1)
Basophils Relative: 1 %
EOS ABS: 0.2 10*3/uL (ref 0.0–0.5)
EOS PCT: 3 %
HCT: 34.1 % — ABNORMAL LOW (ref 38.4–49.9)
HEMOGLOBIN: 10.9 g/dL — AB (ref 13.0–17.1)
LYMPHS ABS: 0.7 10*3/uL — AB (ref 0.9–3.3)
LYMPHS PCT: 14 %
MCH: 29.9 pg (ref 27.2–33.4)
MCHC: 32 g/dL (ref 32.0–36.0)
MCV: 93.7 fL (ref 79.3–98.0)
MONOS PCT: 5 %
Monocytes Absolute: 0.2 10*3/uL (ref 0.1–0.9)
NEUTROS PCT: 77 %
Neutro Abs: 4.1 10*3/uL (ref 1.5–6.5)
Platelets: 216 10*3/uL (ref 140–400)
RBC: 3.64 MIL/uL — ABNORMAL LOW (ref 4.20–5.82)
RDW: 15.8 % — ABNORMAL HIGH (ref 11.0–14.6)
WBC: 5.2 10*3/uL (ref 4.0–10.3)

## 2017-06-19 LAB — COMPREHENSIVE METABOLIC PANEL
ALT: 15 U/L (ref 0–55)
AST: 22 U/L (ref 5–34)
Albumin: 3.6 g/dL (ref 3.5–5.0)
Alkaline Phosphatase: 88 U/L (ref 40–150)
Anion gap: 10 (ref 3–11)
BUN: 29 mg/dL — AB (ref 7–26)
CHLORIDE: 104 mmol/L (ref 98–109)
CO2: 25 mmol/L (ref 22–29)
CREATININE: 1.32 mg/dL — AB (ref 0.70–1.30)
Calcium: 10.1 mg/dL (ref 8.4–10.4)
GFR calc Af Amer: >60 mL/min (ref >60)
GFR calc non Af Amer: 53 mL/min — ABNORMAL LOW (ref >60)
Glucose, Bld: 103 mg/dL (ref 70–140)
Potassium: 5.1 mmol/L (ref 3.5–5.1)
SODIUM: 139 mmol/L (ref 136–145)
Total Bilirubin: <0.2 mg/dL — ABNORMAL LOW (ref 0.2–1.2)
Total Protein: 8.1 g/dL (ref 6.4–8.3)

## 2017-06-26 ENCOUNTER — Inpatient Hospital Stay: Payer: Medicare Other

## 2017-06-26 DIAGNOSIS — C3492 Malignant neoplasm of unspecified part of left bronchus or lung: Secondary | ICD-10-CM

## 2017-06-26 DIAGNOSIS — Z923 Personal history of irradiation: Secondary | ICD-10-CM | POA: Diagnosis not present

## 2017-06-26 DIAGNOSIS — C3412 Malignant neoplasm of upper lobe, left bronchus or lung: Secondary | ICD-10-CM | POA: Diagnosis not present

## 2017-06-26 DIAGNOSIS — R918 Other nonspecific abnormal finding of lung field: Secondary | ICD-10-CM | POA: Diagnosis not present

## 2017-06-26 DIAGNOSIS — Z5111 Encounter for antineoplastic chemotherapy: Secondary | ICD-10-CM | POA: Diagnosis not present

## 2017-06-26 DIAGNOSIS — Z9221 Personal history of antineoplastic chemotherapy: Secondary | ICD-10-CM | POA: Diagnosis not present

## 2017-06-26 DIAGNOSIS — Z5189 Encounter for other specified aftercare: Secondary | ICD-10-CM | POA: Diagnosis not present

## 2017-06-26 LAB — COMPREHENSIVE METABOLIC PANEL
ALBUMIN: 3.3 g/dL — AB (ref 3.5–5.0)
ALT: 12 U/L (ref 0–55)
ANION GAP: 6 (ref 3–11)
AST: 17 U/L (ref 5–34)
Alkaline Phosphatase: 88 U/L (ref 40–150)
BUN: 19 mg/dL (ref 7–26)
CHLORIDE: 105 mmol/L (ref 98–109)
CO2: 27 mmol/L (ref 22–29)
Calcium: 9.4 mg/dL (ref 8.4–10.4)
Creatinine, Ser: 1.14 mg/dL (ref 0.70–1.30)
GFR calc Af Amer: >60 mL/min (ref >60)
GFR calc non Af Amer: >60 mL/min (ref >60)
GLUCOSE: 81 mg/dL (ref 70–140)
Potassium: 3.8 mmol/L (ref 3.5–5.1)
SODIUM: 138 mmol/L (ref 136–145)
Total Bilirubin: 0.3 mg/dL (ref 0.2–1.2)
Total Protein: 7.6 g/dL (ref 6.4–8.3)

## 2017-06-26 LAB — CBC WITH DIFFERENTIAL/PLATELET
BASOS PCT: 1 %
Basophils Absolute: 0 10*3/uL (ref 0.0–0.1)
EOS ABS: 0.1 10*3/uL (ref 0.0–0.5)
EOS PCT: 2 %
HCT: 31.6 % — ABNORMAL LOW (ref 38.4–49.9)
Hemoglobin: 10.3 g/dL — ABNORMAL LOW (ref 13.0–17.1)
LYMPHS ABS: 0.4 10*3/uL — AB (ref 0.9–3.3)
Lymphocytes Relative: 10 %
MCH: 30.3 pg (ref 27.2–33.4)
MCHC: 32.7 g/dL (ref 32.0–36.0)
MCV: 92.7 fL (ref 79.3–98.0)
Monocytes Absolute: 0.6 10*3/uL (ref 0.1–0.9)
Monocytes Relative: 12 %
NEUTROS PCT: 75 %
Neutro Abs: 3.4 10*3/uL (ref 1.5–6.5)
PLATELETS: 235 10*3/uL (ref 140–400)
RBC: 3.4 MIL/uL — AB (ref 4.20–5.82)
RDW: 16.9 % — ABNORMAL HIGH (ref 11.0–14.6)
WBC: 4.5 10*3/uL (ref 4.0–10.3)

## 2017-06-28 ENCOUNTER — Ambulatory Visit (HOSPITAL_COMMUNITY): Payer: Medicare Other

## 2017-06-28 ENCOUNTER — Telehealth: Payer: Self-pay | Admitting: Oncology

## 2017-06-28 NOTE — Telephone Encounter (Signed)
Patient called to reshedule

## 2017-07-01 ENCOUNTER — Inpatient Hospital Stay: Payer: Medicare Other

## 2017-07-03 ENCOUNTER — Other Ambulatory Visit: Payer: Medicare Other

## 2017-07-03 ENCOUNTER — Ambulatory Visit: Payer: Medicare Other

## 2017-07-03 ENCOUNTER — Ambulatory Visit: Payer: Medicare Other | Admitting: Oncology

## 2017-07-09 ENCOUNTER — Telehealth: Payer: Self-pay | Admitting: Oncology

## 2017-07-09 NOTE — Telephone Encounter (Signed)
Spoke to patients sister regarding upcoming appointment date&time.

## 2017-07-16 ENCOUNTER — Ambulatory Visit (HOSPITAL_COMMUNITY)
Admission: RE | Admit: 2017-07-16 | Discharge: 2017-07-16 | Disposition: A | Discharge status: Home / Self Care | Payer: Medicare Other | Source: Ambulatory Visit | Attending: Oncology | Admitting: Oncology

## 2017-07-16 ENCOUNTER — Encounter (HOSPITAL_COMMUNITY): Payer: Self-pay

## 2017-07-16 DIAGNOSIS — R918 Other nonspecific abnormal finding of lung field: Secondary | ICD-10-CM | POA: Diagnosis not present

## 2017-07-16 DIAGNOSIS — C3490 Malignant neoplasm of unspecified part of unspecified bronchus or lung: Secondary | ICD-10-CM | POA: Diagnosis not present

## 2017-07-16 DIAGNOSIS — J439 Emphysema, unspecified: Secondary | ICD-10-CM | POA: Diagnosis not present

## 2017-07-16 DIAGNOSIS — I700 Atherosclerosis of aorta: Secondary | ICD-10-CM | POA: Insufficient documentation

## 2017-07-16 MED ORDER — IOHEXOL 300 MG/ML  SOLN
75.0000 mL | Freq: Once | INTRAMUSCULAR | Status: AC | PRN
  Administered 2017-07-16: 75 mL via INTRAVENOUS

## 2017-07-17 ENCOUNTER — Other Ambulatory Visit: Payer: Self-pay | Admitting: Internal Medicine

## 2017-07-18 ENCOUNTER — Other Ambulatory Visit: Payer: Self-pay | Admitting: Internal Medicine

## 2017-07-19 ENCOUNTER — Inpatient Hospital Stay: Payer: Medicare Other | Attending: Internal Medicine

## 2017-07-19 DIAGNOSIS — I714 Abdominal aortic aneurysm, without rupture: Secondary | ICD-10-CM | POA: Insufficient documentation

## 2017-07-19 DIAGNOSIS — J449 Chronic obstructive pulmonary disease, unspecified: Secondary | ICD-10-CM | POA: Diagnosis not present

## 2017-07-19 DIAGNOSIS — I2510 Atherosclerotic heart disease of native coronary artery without angina pectoris: Secondary | ICD-10-CM | POA: Insufficient documentation

## 2017-07-19 DIAGNOSIS — Z5111 Encounter for antineoplastic chemotherapy: Secondary | ICD-10-CM | POA: Insufficient documentation

## 2017-07-19 DIAGNOSIS — F17200 Nicotine dependence, unspecified, uncomplicated: Secondary | ICD-10-CM | POA: Diagnosis not present

## 2017-07-19 DIAGNOSIS — C3412 Malignant neoplasm of upper lobe, left bronchus or lung: Secondary | ICD-10-CM | POA: Diagnosis not present

## 2017-07-19 DIAGNOSIS — C3492 Malignant neoplasm of unspecified part of left bronchus or lung: Secondary | ICD-10-CM

## 2017-07-19 LAB — COMPREHENSIVE METABOLIC PANEL
ALBUMIN: 3.3 g/dL — AB (ref 3.5–5.0)
ALT: 12 U/L (ref 0–55)
ANION GAP: 7 (ref 3–11)
AST: 16 U/L (ref 5–34)
Alkaline Phosphatase: 94 U/L (ref 40–150)
BUN: 18 mg/dL (ref 7–26)
CO2: 27 mmol/L (ref 22–29)
Calcium: 9.2 mg/dL (ref 8.4–10.4)
Chloride: 102 mmol/L (ref 98–109)
Creatinine, Ser: 1.18 mg/dL (ref 0.70–1.30)
GFR calc Af Amer: >60 mL/min (ref >60)
GFR calc non Af Amer: >60 mL/min (ref >60)
GLUCOSE: 99 mg/dL (ref 70–140)
POTASSIUM: 4.1 mmol/L (ref 3.5–5.1)
SODIUM: 136 mmol/L (ref 136–145)
Total Bilirubin: 0.4 mg/dL (ref 0.2–1.2)
Total Protein: 7.4 g/dL (ref 6.4–8.3)

## 2017-07-19 LAB — CBC WITH DIFFERENTIAL/PLATELET
BASOS ABS: 0 10*3/uL (ref 0.0–0.1)
Basophils Relative: 1 %
Eosinophils Absolute: 0.1 10*3/uL (ref 0.0–0.5)
Eosinophils Relative: 3 %
HEMATOCRIT: 33.8 % — AB (ref 38.4–49.9)
Hemoglobin: 11 g/dL — ABNORMAL LOW (ref 13.0–17.1)
LYMPHS PCT: 13 %
Lymphs Abs: 0.5 10*3/uL — ABNORMAL LOW (ref 0.9–3.3)
MCH: 30.2 pg (ref 27.2–33.4)
MCHC: 32.4 g/dL (ref 32.0–36.0)
MCV: 93 fL (ref 79.3–98.0)
MONOS PCT: 11 %
Monocytes Absolute: 0.4 10*3/uL (ref 0.1–0.9)
NEUTROS ABS: 2.6 10*3/uL (ref 1.5–6.5)
Neutrophils Relative %: 72 %
Platelets: 223 10*3/uL (ref 140–400)
RBC: 3.63 MIL/uL — ABNORMAL LOW (ref 4.20–5.82)
RDW: 16.9 % — AB (ref 11.0–14.6)
WBC: 3.6 10*3/uL — ABNORMAL LOW (ref 4.0–10.3)

## 2017-07-22 ENCOUNTER — Ambulatory Visit: Payer: Medicare Other | Admitting: Oncology

## 2017-07-23 ENCOUNTER — Other Ambulatory Visit: Payer: Self-pay | Admitting: Internal Medicine

## 2017-07-23 ENCOUNTER — Encounter: Payer: Self-pay | Admitting: Oncology

## 2017-07-23 ENCOUNTER — Inpatient Hospital Stay (HOSPITAL_BASED_OUTPATIENT_CLINIC_OR_DEPARTMENT_OTHER): Payer: Medicare Other | Admitting: Oncology

## 2017-07-23 VITALS — BP 134/98 | HR 94 | Temp 97.6°F | Resp 18 | Ht 75.0 in | Wt 125.1 lb

## 2017-07-23 DIAGNOSIS — C3412 Malignant neoplasm of upper lobe, left bronchus or lung: Secondary | ICD-10-CM

## 2017-07-23 DIAGNOSIS — I714 Abdominal aortic aneurysm, without rupture: Secondary | ICD-10-CM | POA: Diagnosis not present

## 2017-07-23 DIAGNOSIS — Z5111 Encounter for antineoplastic chemotherapy: Secondary | ICD-10-CM | POA: Diagnosis not present

## 2017-07-23 DIAGNOSIS — I2510 Atherosclerotic heart disease of native coronary artery without angina pectoris: Secondary | ICD-10-CM | POA: Diagnosis not present

## 2017-07-23 DIAGNOSIS — Z7189 Other specified counseling: Secondary | ICD-10-CM

## 2017-07-23 DIAGNOSIS — J449 Chronic obstructive pulmonary disease, unspecified: Secondary | ICD-10-CM | POA: Diagnosis not present

## 2017-07-23 DIAGNOSIS — F17200 Nicotine dependence, unspecified, uncomplicated: Secondary | ICD-10-CM

## 2017-07-23 DIAGNOSIS — Z720 Tobacco use: Secondary | ICD-10-CM

## 2017-07-23 DIAGNOSIS — C3492 Malignant neoplasm of unspecified part of left bronchus or lung: Secondary | ICD-10-CM

## 2017-07-23 MED ORDER — DEXAMETHASONE 4 MG PO TABS: ORAL_TABLET | ORAL | 1 refills | Status: DC

## 2017-07-23 NOTE — Patient Instructions (Signed)
Docetaxel injection What is this medicine? DOCETAXEL (doe se TAX el) is a chemotherapy drug. It targets fast dividing cells, like cancer cells, and causes these cells to die. This medicine is used to treat many types of cancers like breast cancer, certain stomach cancers, head and neck cancer, lung cancer, and prostate cancer. This medicine may be used for other purposes; ask your health care provider or pharmacist if you have questions. COMMON BRAND NAME(S): Docefrez, Taxotere What should I tell my health care provider before I take this medicine? They need to know if you have any of these conditions: -infection (especially a virus infection such as chickenpox, cold sores, or herpes) -liver disease -low blood counts, like low white cell, platelet, or red cell counts -an unusual or allergic reaction to docetaxel, polysorbate 80, other chemotherapy agents, other medicines, foods, dyes, or preservatives -pregnant or trying to get pregnant -breast-feeding How should I use this medicine? This drug is given as an infusion into a vein. It is administered in a hospital or clinic by a specially trained health care professional. Talk to your pediatrician regarding the use of this medicine in children. Special care may be needed. Overdosage: If you think you have taken too much of this medicine contact a poison control center or emergency room at once. NOTE: This medicine is only for you. Do not share this medicine with others. What if I miss a dose? It is important not to miss your dose. Call your doctor or health care professional if you are unable to keep an appointment. What may interact with this medicine? -cyclosporine -erythromycin -ketoconazole -medicines to increase blood counts like filgrastim, pegfilgrastim, sargramostim -vaccines Talk to your doctor or health care professional before taking any of these medicines: -acetaminophen -aspirin -ibuprofen -ketoprofen -naproxen This list  may not describe all possible interactions. Give your health care provider a list of all the medicines, herbs, non-prescription drugs, or dietary supplements you use. Also tell them if you smoke, drink alcohol, or use illegal drugs. Some items may interact with your medicine. What should I watch for while using this medicine? Your condition will be monitored carefully while you are receiving this medicine. You will need important blood work done while you are taking this medicine. This drug may make you feel generally unwell. This is not uncommon, as chemotherapy can affect healthy cells as well as cancer cells. Report any side effects. Continue your course of treatment even though you feel ill unless your doctor tells you to stop. In some cases, you may be given additional medicines to help with side effects. Follow all directions for their use. Call your doctor or health care professional for advice if you get a fever, chills or sore throat, or other symptoms of a cold or flu. Do not treat yourself. This drug decreases your body's ability to fight infections. Try to avoid being around people who are sick. This medicine may increase your risk to bruise or bleed. Call your doctor or health care professional if you notice any unusual bleeding. This medicine may contain alcohol in the product. You may get drowsy or dizzy. Do not drive, use machinery, or do anything that needs mental alertness until you know how this medicine affects you. Do not stand or sit up quickly, especially if you are an older patient. This reduces the risk of dizzy or fainting spells. Avoid alcoholic drinks. Do not become pregnant while taking this medicine. Women should inform their doctor if they wish to become pregnant or   think they might be pregnant. There is a potential for serious side effects to an unborn child. Talk to your health care professional or pharmacist for more information. Do not breast-feed an infant while taking  this medicine. What side effects may I notice from receiving this medicine? Side effects that you should report to your doctor or health care professional as soon as possible: -allergic reactions like skin rash, itching or hives, swelling of the face, lips, or tongue -low blood counts - This drug may decrease the number of white blood cells, red blood cells and platelets. You may be at increased risk for infections and bleeding. -signs of infection - fever or chills, cough, sore throat, pain or difficulty passing urine -signs of decreased platelets or bleeding - bruising, pinpoint red spots on the skin, black, tarry stools, nosebleeds -signs of decreased red blood cells - unusually weak or tired, fainting spells, lightheadedness -breathing problems -fast or irregular heartbeat -low blood pressure -mouth sores -nausea and vomiting -pain, swelling, redness or irritation at the injection site -pain, tingling, numbness in the hands or feet -swelling of the ankle, feet, hands -weight gain Side effects that usually do not require medical attention (report to your doctor or health care professional if they continue or are bothersome): -bone pain -complete hair loss including hair on your head, underarms, pubic hair, eyebrows, and eyelashes -diarrhea -excessive tearing -changes in the color of fingernails -loosening of the fingernails -nausea -muscle pain -red flush to skin -sweating -weak or tired This list may not describe all possible side effects. Call your doctor for medical advice about side effects. You may report side effects to FDA at 1-800-FDA-1088. Where should I keep my medicine? This drug is given in a hospital or clinic and will not be stored at home. NOTE: This sheet is a summary. It may not cover all possible information. If you have questions about this medicine, talk to your doctor, pharmacist, or health care provider.  2018 Elsevier/Gold Standard (2015-03-31  12:32:56)  Ramucirumab injection What is this medicine? RAMUCIRUMAB (ra mue SIR ue mab) is a monoclonal antibody. It is used to treat stomach cancer, colorectal cancer, or lung cancer. This medicine may be used for other purposes; ask your health care provider or pharmacist if you have questions. COMMON BRAND NAME(S): Cyramza What should I tell my health care provider before I take this medicine? They need to know if you have any of these conditions: -bleeding disorders -blood clots -heart disease, including heart failure, heart attack, or chest pain (angina) -high blood pressure -infection (especially a virus infection such as chickenpox, cold sores, or herpes) -protein in your urine -recent surgery -stroke -an unusual or allergic reaction to ramucirumab, other medicines, foods, dyes, or preservatives -pregnant or trying to get pregnant -breast-feeding How should I use this medicine? This medicine is for infusion into a vein. It is given by a health care professional in a hospital or clinic setting. Talk to your pediatrician regarding the use of this medicine in children. Special care may be needed. Overdosage: If you think you have taken too much of this medicine contact a poison control center or emergency room at once. NOTE: This medicine is only for you. Do not share this medicine with others. What if I miss a dose? It is important not to miss your dose. Call your doctor or health care professional if you are unable to keep an appointment. What may interact with this medicine? Interactions have not been studied. This list may  not describe all possible interactions. Give your health care provider a list of all the medicines, herbs, non-prescription drugs, or dietary supplements you use. Also tell them if you smoke, drink alcohol, or use illegal drugs. Some items may interact with your medicine. What should I watch for while using this medicine? Your condition will be monitored  carefully while you are receiving this medicine. You will need to to check your blood pressure and have your blood and urine tested while you are taking this medicine. Your condition will be monitored carefully while you are receiving this medicine. This medicine may increase your risk to bruise or bleed. Call your doctor or health care professional if you notice any unusual bleeding. This medicine may rarely cause 'gastrointestinal perforation' (holes in the stomach, intestines or colon), a serious side effect requiring surgery to repair. This medicine should be started at least 28 days following major surgery and the site of the surgery should be totally healed. Check with your doctor before scheduling dental work or surgery while you are receiving this treatment. Talk to your doctor if you have recently had surgery or if you have a wound that has not healed. Do not become pregnant while taking this medicine or for 3 months after stopping it. Women should inform their doctor if they wish to become pregnant or think they might be pregnant. There is a potential for serious side effects to an unborn child. Talk to your health care professional or pharmacist for more information. What side effects may I notice from receiving this medicine? Side effects that you should report to your doctor or health care professional as soon as possible: -allergic reactions like skin rash, itching or hives, breathing problems, swelling of the face, lips, or tongue -signs of infection - fever or chills, cough, sore throat -chest pain or chest tightness -confusion -dizziness -feeling faint or lightheaded, falls -severe abdominal pain -severe nausea, vomiting -signs and symptoms of bleeding such as bloody or black, tarry stools; red or dark-brown urine; spitting up blood or brown material that looks like coffee grounds; red spots on the skin; unusual bruising or bleeding from the eye, gums, or nose -signs and symptoms of  a blood clot such as breathing problems; changes in vision; chest pain; severe, sudden headache; pain, swelling, warmth in the leg; trouble speaking; sudden numbness or weakness of the face, arm or leg -symptoms of a stroke: change in mental awareness, inability to talk or move one side of the body -trouble walking, dizziness, loss of balance or coordination Side effects that usually do not require medical attention (report to your doctor or health care professional if they continue or are bothersome): -cold, clammy skin -constipation -diarrhea -headache -nausea, vomiting -stomach pain -unusually slow heartbeat -unusually weak or tired This list may not describe all possible side effects. Call your doctor for medical advice about side effects. You may report side effects to FDA at 1-800-FDA-1088. Where should I keep my medicine? This drug is given in a hospital or clinic and will not be stored at home. NOTE: This sheet is a summary. It may not cover all possible information. If you have questions about this medicine, talk to your doctor, pharmacist, or health care provider.  2018 Elsevier/Gold Standard (2015-03-31 08:20:29)

## 2017-07-23 NOTE — Assessment & Plan Note (Signed)
This is a very pleasant 67year old African-American male with unresectable a stage IIIa non-small cell lung cancer, status post a course of concurrent chemoradiation with weekly carboplatin and paclitaxel and tolerated the treatment well. His scan showed no evidence for disease progression and the patient was started on consolidation treatment with immunotherapy with Imfinzi (Durvalumab) status post 2 cycles. This treatment was discontinued secondary to a significant skin rash treated with steroids with recurrence after the patient was resumed again. He has was then on observation for several few months. Repeat CT scan of the chest showed improvement of the primary apical left upper lobe cavitary mass but there was development of new 2.0 cm solid pulmonary nodules at the medial central margin of the primary mass in addition to another new 0.5 cm left upper lobe pulmonary nodule. These findings are suspicious for metastatic disease. The patient was started on systemic chemotherapy with carboplatin and paclitaxel status post 6 cycles.  Patient tolerated his treatment fairly well. He had a restaging CT scan of the chest and is here to discuss the results.  The patient was seen with Dr. Julien Nordmann.  CT scan results were discussed with the patient which show evidence of disease progression in the left lung.  Treatment options were reviewed with the patient and recommend treatment with docetaxel 75 mg meter squared and Cyramza 10 mg/kg every 3 weeks. Discussed with the patient adverse effects of this treatment including but not limited to alopecia, myelosuppression, nausea and vomiting, peripheral neuropathy, liver or renal dysfunction in addition to the adverse effect of Cyramza including but not limited to pulmonary hemorrhage, GI perforation, wound healing delay as well as hypertension and proteinuria.  The patient is interested in proceeding with treatment.  Anticipate first dose of treatment on 07/30/2017.  He  was given a prescription for dexamethasone to take the day before, day of, day after each cycle of chemotherapy. The patient will have weekly labs while he is on treatment. Follow-up visit will be in approximately 4 weeks for evaluation prior to cycle #2.  The patient continues to smoke.  He was advised to quit.  I have called the patient's sister and discussed the CT scan results with her and reviewed how to take the dexamethasone premedication.  She stated understanding of the instructions.  The patient was advised to call immediately if he has any concerning symptoms in the interval. The patient voices understanding of current disease status and treatment options and is in agreement with the current care plan. All questions were answered. The patient knows to call the clinic with any problems, questions or concerns. We can certainly see the patient much sooner if necessary.

## 2017-07-23 NOTE — Progress Notes (Signed)
[facility] OFFICE PROGRESS NOTE  Adrian Forward, MD [place] [address]  DIAGNOSIS: Stage IIIa (T2b, N2, M0) non-small cell lung cancer, squamous cell carcinoma presented with left upper lobe lung mass in addition to AP window lymphadenopathy diagnosed in February 2018.  PRIOR THERAPY: 1) Concurrent chemoradiation therapy. He received radiation for approximately 2-3 weeks prior to starting his chemotherapy. He completed his radiation on 07/05/2016. He received 2 doses of weekly carboplatin for an AUC of 2 and paclitaxel 45 mg meter squared which was last given on 07/03/2016. 2) consolidation treatment with immunotherapy with Imfinzi (Durvalumab) 10 MG/KG every 2 weeks. First dose 08/22/2016. Status post 2 cycles. Last dose was given September 19, 2016 discontinued secondary to intolerance and significant skin rash. 3) Systemic chemotherapy with carboplatin for AUC of 5 and paclitaxel 175 mg/M2 every 3 weeks. First dose February 06, 2017.Status post 6 cycles.  CURRENT THERAPY: Docetaxel 75 mg/m with Cyramza 10 mg/kg given every 3 weeks.  First dose expected on 07/30/2017.  INTERVAL HISTORY: [name] 67 y.o. male returns for routine follow-up visit by himself.  The patient is feeling fine today with no specific complaints except for a nonproductive cough.  The patient denies fevers and chills.  Denies chest pain, shortness breath, hemoptysis.  Denies nausea, vomiting, constipation, diarrhea.  Denies recent weight loss or night sweats.  The patient had a recent restaging CT scan of the chest and is here to discuss the results.  MEDICAL HISTORY: Past Medical History:  Diagnosis Date  . AAA (abdominal aortic aneurysm) ([place])   . Abdominal aortic aneurysm ([place])    AAA And bilateral common iliac artery aneurysms  . BPH (benign prostatic hyperplasia)   . CAD (coronary artery disease)    Dr [name] @ [facility]   . Chronic mental illness 04/21/2011    Diagnosis unclear,  Family provides good care  . COPD (chronic obstructive pulmonary disease) ([place]) 04/21/2011   smoking  . Drug-induced skin rash 09/05/2016  . Ejection fraction    EF 35-40%, echo, May, 2010  . Hyperlipidemia   . Hyperplastic colon polyp 04/21/2011  . Hypertension   . Preop cardiovascular exam    Cardiac clearance for major vascular surgery, May, 2013  . Pulmonary nodule    Chest CT May, 2013, 2 small pulmonary nodules, needs appropriate followup,  this CT was not ordered by the cardiology team  . PVD (peripheral vascular disease) ([place]) 04/21/2011  . Retention of urine   . Shortness of breath   . Squamous cell lung cancer, left ([place]) 06/13/2016  . Tobacco abuse     ALLERGIES:  is allergic to ace inhibitors.  MEDICATIONS:  Current Outpatient Medications  Medication Sig Dispense Refill  . aspirin EC 81 MG tablet Take 81 mg by mouth daily.    . psyllium (METAMUCIL) 58.6 % packet Take 1 packet by mouth daily. 30 each 12  . rosuvastatin (CRESTOR) 40 MG tablet TAKE 1 TABLET BY MOUTH EVERY DAY 90 tablet 0  . dexamethasone (DECADRON) 4 MG tablet Take 2 tabs twice a day the day before, day of, and day after each cycle of chemo 20 tablet 1  . ondansetron (ZOFRAN ODT) 4 MG disintegrating tablet Take 1 tablet (4 mg total) by mouth every 8 (eight) hours as needed for nausea or vomiting. (Patient not taking: Reported on 07/23/2017) 10 tablet 0  . prochlorperazine (COMPAZINE) 10 MG tablet Take 1 tablet (10 mg total) by mouth every 6 (  six) hours as needed for nausea or vomiting. (Patient not taking: Reported on 07/23/2017) 30 tablet 0  . Sennosides (SENOKOT PO) Take 1 tablet by mouth daily as needed (CONSTIPATION).    [name] sucralfate (CARAFATE) 1 GM/10ML suspension Take 10 mLs (1 g total) by mouth 4 (four) times daily -  with meals and at bedtime. (Patient not taking: Reported on 07/23/2017) 420 mL 0   No current facility-administered medications for this visit.     SURGICAL HISTORY:  Past  Surgical History:  Procedure Laterality Date  . ABDOMINAL AORTA STENT  07/2011  . ABDOMINAL AORTAGRAM N/A 07/26/2011   Procedure: ABDOMINAL Maxcine Ham;  Surgeon: Conrad [place], MD;  Location: [facility] C[facility];  Service: Cardiovascular;  Laterality: N/A;  . ABDOMINAL AORTIC ANEURYSM REPAIR  07/2011  . CORONARY ARTERY BYPASS GRAFT  07/2008   CABG X5/notes 07/12/2010  . EMBOLIZATION Left 07/26/2011   Procedure: EMBOLIZATION;  Surgeon: Conrad [place], MD;  Location: [facility] CATH LAB;  Service: Cardiovascular;  Laterality: Left;  . TRANSURETHRAL RESECTION OF PROSTATE  11/29/2011   Procedure: TRANSURETHRAL RESECTION OF THE PROSTATE WITH GYRUS INSTRUMENTS;  Surgeon: Malka So, MD;  Location: [facility];  Service: Urology;  Laterality: N/A;  Prostate Ultrasound, and Biopsy  . VIDEO BRONCHOSCOPY Bilateral 04/12/2016   Procedure: VIDEO BRONCHOSCOPY WITH FLUORO;  Surgeon: Juanito Doom, MD;  Location: [facility];  Service: Cardiopulmonary;  Laterality: Bilateral;    REVIEW OF SYSTEMS:   Review of Systems  Constitutional: Negative for appetite change, chills, fatigue, fever and unexpected weight change.  HENT:   Negative for mouth sores, nosebleeds, sore throat and trouble swallowing.   Eyes: Negative for eye problems and icterus.  Respiratory: Negative for hemoptysis, shortness of breath and wheezing.  Positive for nonproductive cough. Cardiovascular: Negative for chest pain and leg swelling.  Gastrointestinal: Negative for abdominal pain, constipation, diarrhea, nausea and vomiting.  Genitourinary: Negative for bladder incontinence, difficulty urinating, dysuria, frequency and hematuria.   Musculoskeletal: Negative for back pain, gait problem, neck pain and neck stiffness.  Skin: Negative for itching and rash.  Neurological: Negative for dizziness, extremity weakness, gait problem, headaches, light-headedness and seizures.  Hematological: Negative for adenopathy. Does not bruise/bleed easily.   Psychiatric/Behavioral: Negative for confusion, depression and sleep disturbance. The patient is not nervous/anxious.     PHYSICAL EXAMINATION:  Blood pressure (!) 134/98, pulse 94, temperature 97.6 F (36.4 C), temperature source Oral, resp. rate 18, height 6\' 3"  (1.905 m), weight 125 lb 1.6 oz (56.7 kg), SpO2 91 %.  ECOG PERFORMANCE STATUS: 1 - Symptomatic but completely ambulatory  Physical Exam  Constitutional: Oriented to person, place, and time and well-developed. No distress.  HENT:  Head: Normocephalic and atraumatic.  Mouth/Throat: Oropharynx is clear and moist. No oropharyngeal exudate.  Eyes: Conjunctivae are normal. Right eye exhibits no discharge. Left eye exhibits no discharge. No scleral icterus.  Neck: Normal range of motion. Neck supple.  Cardiovascular: Normal rate, regular rhythm, normal heart sounds and intact distal pulses.   Pulmonary/Chest: Effort normal and breath sounds normal. No respiratory distress. No wheezes. No rales.  Abdominal: Soft. Bowel sounds are normal. Exhibits no distension and no mass. There is no tenderness.  Musculoskeletal: Normal range of motion. Exhibits no edema.  Lymphadenopathy:    No cervical adenopathy.  Neurological: Alert and oriented to person, place, and time. Exhibits normal muscle tone. Gait normal. Coordination normal.  Skin: Skin is warm and dry. No rash noted. Not diaphoretic. No erythema. No pallor.  Psychiatric:  Mood, memory normal.  Vitals reviewed.  LABORATORY DATA: Lab Results  Component Value Date   WBC 3.6 (L) 07/19/2017   HGB 11.0 (L) 07/19/2017   HCT 33.8 (L) 07/19/2017   MCV 93.0 07/19/2017   PLT 223 07/19/2017      Chemistry      Component Value Date/Time   NA 136 07/19/2017 1033   NA 139 03/13/2017 1059   K 4.1 07/19/2017 1033   K 3.9 03/13/2017 1059   CL 102 07/19/2017 1033   CO2 27 07/19/2017 1033   CO2 26 03/13/2017 1059   BUN 18 07/19/2017 1033   BUN 18.6 03/13/2017 1059   CREATININE 1.18  07/19/2017 1033   CREATININE 1.2 03/13/2017 1059      Component Value Date/Time   CALCIUM 9.2 07/19/2017 1033   CALCIUM 8.9 03/13/2017 1059   ALKPHOS 94 07/19/2017 1033   ALKPHOS 118 03/13/2017 1059   AST 16 07/19/2017 1033   AST 16 03/13/2017 1059   ALT 12 07/19/2017 1033   ALT 14 03/13/2017 1059   BILITOT 0.4 07/19/2017 1033   BILITOT 0.36 03/13/2017 1059       RADIOGRAPHIC STUDIES:  Ct Chest W Contrast  Result Date: 07/16/2017 CLINICAL DATA:  Patient with history of lung cancer diagnosed 2018 status post chemotherapy and radiation. Intermittent chest pain. Shortness of breath. EXAM: CT CHEST WITH CONTRAST TECHNIQUE: Multidetector CT imaging of the chest was performed during intravenous contrast administration. CONTRAST:  59mL OMNIPAQUE IOHEXOL 300 MG/ML  SOLN COMPARISON:  Chest radiograph 04/29/2017 FINDINGS: Cardiovascular: Normal heart size. No pericardial effusion. Thoracic aortic vascular calcifications. Status post CABG procedure. Mediastinum/Nodes: No enlarged axillary, mediastinal or hilar lymphadenopathy. Normal esophagus. Lungs/Pleura: Dependent mucus within the trachea and right mainstem bronchus. Centrilobular and paraseptal emphysematous change. Interval increase in size left upper lobe mass measuring 3.1 x 3.4 cm (image 46; series 2), previously 1.9 x 2.3 cm. Unchanged 5 mm left upper lobe nodule (image 52; series 7). No pleural effusion or pneumothorax. Upper Abdomen: No acute process. Musculoskeletal: No aggressive or acute appearing osseous lesions. Status post median sternotomy. Collateral flow demonstrated within upper thoracic spine vertebral bodies. IMPRESSION: 1. Interval increase in size left suprahilar mass. 2. No additional new or enlarging pulmonary nodules. 3. Aortic Atherosclerosis (ICD10-I70.0) and Emphysema (ICD10-J43.9). Electronically Signed   By: Lovey Newcomer M.D.   On: 07/16/2017 12:24     ASSESSMENT/PLAN:  Stage IV squamous cell carcinoma of left lung  ([place]) This is a very pleasant 67year old African-American male with unresectable a stage IIIa non-small cell lung cancer, status post a course of concurrent chemoradiation with weekly carboplatin and paclitaxel and tolerated the treatment well. His scan showed no evidence for disease progression and the patient was started on consolidation treatment with immunotherapy with Imfinzi (Durvalumab) status post 2 cycles. This treatment was discontinued secondary to a significant skin rash treated with steroids with recurrence after the patient was resumed again. He has was then on observation for several few months. Repeat CT scan of the chest showed improvement of the primary apical left upper lobe cavitary mass but there was development of new 2.0 cm solid pulmonary nodules at the medial central margin of the primary mass in addition to another new 0.5 cm left upper lobe pulmonary nodule. These findings are suspicious for metastatic disease. The patient was started on systemic chemotherapy with carboplatin and paclitaxel status post 6 cycles.  Patient tolerated his treatment fairly well. He had a restaging CT scan of the chest  and is here to discuss the results.  The patient was seen with Dr. Julien Nordmann.  CT scan results were discussed with the patient which show evidence of disease progression in the left lung.  Treatment options were reviewed with the patient and recommend treatment with docetaxel 75 mg meter squared and Cyramza 10 mg/kg every 3 weeks. Discussed with the patient adverse effects of this treatment including but not limited to alopecia, myelosuppression, nausea and vomiting, peripheral neuropathy, liver or renal dysfunction in addition to the adverse effect of Cyramza including but not limited to pulmonary hemorrhage, GI perforation, wound healing delay as well as hypertension and proteinuria.  The patient is interested in proceeding with treatment.  Anticipate first dose of treatment on  07/30/2017.  He was given a prescription for dexamethasone to take the day before, day of, day after each cycle of chemotherapy. The patient will have weekly labs while he is on treatment. Follow-up visit will be in approximately 4 weeks for evaluation prior to cycle #2.  The patient continues to smoke.  He was advised to quit.  I have called the patient's sister and discussed the CT scan results with her and reviewed how to take the dexamethasone premedication.  She stated understanding of the instructions.  The patient was advised to call immediately if he has any concerning symptoms in the interval. The patient voices understanding of current disease status and treatment options and is in agreement with the current care plan. All questions were answered. The patient knows to call the clinic with any problems, questions or concerns. We can certainly see the patient much sooner if necessary.   Orders Placed This Encounter  Procedures  . CBC with Differential (Cancer Center Only)    Standing Status:   Standing    Number of Occurrences:   20    Standing Expiration Date:   07/24/2018  . CMP ([facility] only)    Standing Status:   Standing    Number of Occurrences:   20    Standing Expiration Date:   07/24/2018   Adrian Bussing, DNP, AGPCNP-BC, AOCNP 07/23/17   ADDENDUM: Hematology/Oncology Attending: I had a face-to-face encounter with the patient.  I recommended his care plan.  This is a very pleasant 67 years old African-American male with recurrent non-small cell lung cancer.  He was a started on treatment with carboplatin and paclitaxel status post 6 cycles.  He has been tolerating this treatment fairly well.  He had repeat CT scan of the chest, abdomen and pelvis performed recently.  Unfortunately his a scan showed evidence for disease progression. I had a lengthy discussion with the patient today about his treatment options.  I gave him the option of palliative care and hospice  referral versus consideration of treatment with second line chemotherapy with docetaxel 75 mg/M2 and Cyramza 10 mg/M2 every 3 weeks. We discussed with the patient the adverse effect of this treatment.  He will also be started on Decadron 8 mg p.o. twice daily the day before, day of and day after chemotherapy every 3 weeks. He would like to proceed with the chemotherapy as planned and he is expected to start the first cycle of this treatment next week. He will come back for follow-up visit in 4 weeks with the start of cycle #2. The patient was advised to call immediately if he has any concerning symptoms in the interval.  Disclaimer: This note was dictated with voice recognition software. Similar sounding words can inadvertently be transcribed and  may be missed upon review. Eilleen Kempf, MD 07/24/17

## 2017-07-23 NOTE — Progress Notes (Signed)
DISCONTINUE ON PATHWAY REGIMEN - Non-Small Cell Lung     A cycle is every 21 days:     Paclitaxel      Carboplatin   **Always confirm dose/schedule in your pharmacy ordering system**    REASON: Disease Progression PRIOR TREATMENT: [id number]: Carboplatin + Paclitaxel q21 Days x 4 Cycles TREATMENT RESPONSE: Progressive Disease (PD)  START ON PATHWAY REGIMEN - Non-Small Cell Lung     A cycle is every 21 days:     Ramucirumab      Docetaxel   **Always confirm dose/schedule in your pharmacy ordering system**    Patient Characteristics: Stage IV Metastatic, Squamous, PS = 0, 1, Third Line, Prior PD-1/PD-L1 Inhibitor or No Prior PD-1/PD-L1 Inhibitor and Not a Candidate for Immunotherapy AJCC T Category: T2b Current Disease Status: Distant Metastases AJCC N Category: N2 AJCC M Category: M1a AJCC 8 Stage Grouping: IVA Histology: Squamous Cell Line of therapy: Third Line PD-L1 Expression Status: Did Not Order Test Performance Status: PS = 0, 1 Immunotherapy Candidate Status: Not a Candidate for Immunotherapy Prior Immunotherapy Status: Prior PD-1/PD-L1 Inhibitor Intent of Therapy: Non-Curative / Palliative Intent, Discussed with Patient

## 2017-07-24 ENCOUNTER — Other Ambulatory Visit: Payer: Self-pay | Admitting: Internal Medicine

## 2017-07-24 DIAGNOSIS — Z7189 Other specified counseling: Secondary | ICD-10-CM

## 2017-07-26 ENCOUNTER — Telehealth: Payer: Self-pay | Admitting: Internal Medicine

## 2017-07-26 ENCOUNTER — Encounter: Payer: Self-pay | Admitting: Pharmacist

## 2017-07-26 NOTE — Telephone Encounter (Signed)
Appointments scheduled letter/calendar mailed to patient.  I also left a message for his sister [name] with this info as well per 5/14 los

## 2017-07-31 IMAGING — PT NM PET TUM IMG INITIAL (PI) SKULL BASE T - THIGH
1 of 8 series · 1 of 25 positions shown · non-contrast
Comparison: Multiple exams, including 03/28/2016 and 03/29/2016

CLINICAL DATA: Initial treatment strategy for left upper lobe mass.

EXAM:
NUCLEAR MEDICINE PET SKULL BASE TO THIGH
TECHNIQUE: 7.9 mCi F-18 FDG was injected intravenously. Full-ring PET imaging
was performed from the skull base to thigh after the radiotracer. CT
data was obtained and used for attenuation correction and anatomic
localization.
FASTING BLOOD GLUCOSE:  Value: 97 mg/dl

[Series 3: pet sk_thigh ac · axial · 5.0mm · 4.07mm/px · 1 of 220 slices shown]
[im 110/220]
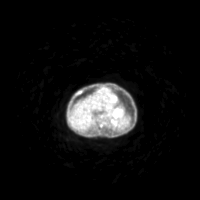

[1 of 25 positions shown; findings below may reference images not displayed]

FINDINGS: NECK

No hypermetabolic lymph nodes in the neck. Activity related to
dental disease noted. Physiologic muscular activity along the neck.
Physiologic glottic activity.

CHEST

Centrally necrotic 4.8 by 3.7 cm left upper lobe mass, maximum SUV
19.4, compatible with malignancy or granulomatous process. Small
focus of hypermetabolic activity in the AP window/ left hilar
region, maximum SUV 7.6, compatible with a small metastatic lymph
node.

Coronary, aortic arch, and branch vessel atherosclerotic vascular
disease. Severe emphysema. Scarring or atelectasis in both lower
lobes.

Anteriorly in the left lower lobe there is a 5 mm pulmonary nodule
which is not measurably hypermetabolic but is below sensitive PET-CT
size thresholds. Similarly the 3 mm right lower lobe nodule shown on
prior CT chest is below sensitive PET-CT size thresholds. Both of
these are stable from 9151 and considered benign.

ABDOMEN/PELVIS

No abnormal hypermetabolic activity within the liver, pancreas,
adrenal glands, or spleen. No hypermetabolic lymph nodes in the
abdomen or pelvis.

Note is made of infrarenal abdominal aorta with aorta bi-iliac stent
graft.

Paucity of adipose tissue noted.

Aortoiliac atherosclerotic vascular disease.

SKELETON

Hypermetabolic lesions of the distal right seventh, eighth, ninth,
and tenth ribs favor healing fractures especially given the
proximity of these lesions to the to each other. These are along the
anterolateral portions of the ribs.
IMPRESSION: 1. Centrally necrotic 4.8 cm hypermetabolic left upper lobe mass,
appearance favors malignancy such as squamous cell carcinoma. There
is a small hypermetabolic AP window/left hilar region lymph node
compatible with early nodal metastatic disease. No other metastatic
lesions identified. Active granulomatous disease could have a
similar appearance but is considered much less likely. The 5 mm
pulmonary nodule in the left lower lobe and 3 mm right lower lobe
pulmonary nodule are stable from 9151 and considered benign.
2. Healing distal right seventh, eighth, ninth, and tenth rib
fractures are mildly hypermetabolic. Given the proximity of these
fractures to each other, I am skeptical of bony metastatic disease
in this vicinity.
3. Coronary, aortic arch, and branch vessel atherosclerotic vascular
disease. Aortoiliac atherosclerotic vascular disease. Infrarenal
abdominal aortic aneurysm with aorta bi-iliac stent graft.
4. Severe emphysema.

## 2017-08-02 ENCOUNTER — Inpatient Hospital Stay: Payer: Medicare Other

## 2017-08-02 VITALS — BP 144/97 | HR 55 | Temp 98.0°F | Resp 19

## 2017-08-02 DIAGNOSIS — F17200 Nicotine dependence, unspecified, uncomplicated: Secondary | ICD-10-CM | POA: Diagnosis not present

## 2017-08-02 DIAGNOSIS — Z5111 Encounter for antineoplastic chemotherapy: Secondary | ICD-10-CM | POA: Diagnosis not present

## 2017-08-02 DIAGNOSIS — C3412 Malignant neoplasm of upper lobe, left bronchus or lung: Secondary | ICD-10-CM

## 2017-08-02 DIAGNOSIS — C3492 Malignant neoplasm of unspecified part of left bronchus or lung: Secondary | ICD-10-CM

## 2017-08-02 DIAGNOSIS — J449 Chronic obstructive pulmonary disease, unspecified: Secondary | ICD-10-CM | POA: Diagnosis not present

## 2017-08-02 DIAGNOSIS — I2510 Atherosclerotic heart disease of native coronary artery without angina pectoris: Secondary | ICD-10-CM | POA: Diagnosis not present

## 2017-08-02 DIAGNOSIS — I714 Abdominal aortic aneurysm, without rupture: Secondary | ICD-10-CM | POA: Diagnosis not present

## 2017-08-02 LAB — CBC WITH DIFFERENTIAL (CANCER CENTER ONLY)
BASOS ABS: 0 10*3/uL (ref 0.0–0.1)
Basophils Relative: 0 %
EOS ABS: 0 10*3/uL (ref 0.0–0.5)
Eosinophils Relative: 0 %
HCT: 34.1 % — ABNORMAL LOW (ref 38.4–49.9)
HEMOGLOBIN: 10.9 g/dL — AB (ref 13.0–17.1)
LYMPHS ABS: 0.4 10*3/uL — AB (ref 0.9–3.3)
LYMPHS PCT: 7 %
MCH: 29.8 pg (ref 27.2–33.4)
MCHC: 32 g/dL (ref 32.0–36.0)
MCV: 93.2 fL (ref 79.3–98.0)
Monocytes Absolute: 0.6 10*3/uL (ref 0.1–0.9)
Monocytes Relative: 9 %
NEUTROS PCT: 84 %
Neutro Abs: 5 10*3/uL (ref 1.5–6.5)
Platelet Count: 244 10*3/uL (ref 140–400)
RBC: 3.66 MIL/uL — AB (ref 4.20–5.82)
RDW: 15.9 % — ABNORMAL HIGH (ref 11.0–14.6)
WBC: 6 10*3/uL (ref 4.0–10.3)

## 2017-08-02 LAB — CMP (CANCER CENTER ONLY)
ALK PHOS: 92 U/L (ref 40–150)
ALT: 22 U/L (ref 0–55)
AST: 21 U/L (ref 5–34)
Albumin: 3.4 g/dL — ABNORMAL LOW (ref 3.5–5.0)
Anion gap: 10 (ref 3–11)
BUN: 21 mg/dL (ref 7–26)
CALCIUM: 9.5 mg/dL (ref 8.4–10.4)
CO2: 22 mmol/L (ref 22–29)
CREATININE: 1.02 mg/dL (ref 0.70–1.30)
Chloride: 106 mmol/L (ref 98–109)
GFR, Estimated: >60 mL/min (ref >60)
GLUCOSE: 82 mg/dL (ref 70–140)
Potassium: 3.7 mmol/L (ref 3.5–5.1)
SODIUM: 138 mmol/L (ref 136–145)
Total Bilirubin: <0.2 mg/dL — ABNORMAL LOW (ref 0.2–1.2)
Total Protein: 7.6 g/dL (ref 6.4–8.3)

## 2017-08-02 MED ORDER — DIPHENHYDRAMINE HCL 25 MG PO CAPS
ORAL_CAPSULE | ORAL | Status: AC
  Filled 2017-08-02: qty 2

## 2017-08-02 MED ORDER — DIPHENHYDRAMINE HCL 50 MG/ML IJ SOLN
INTRAMUSCULAR | Status: AC
  Filled 2017-08-02: qty 1

## 2017-08-02 MED ORDER — ACETAMINOPHEN 325 MG PO TABS
650.0000 mg | ORAL_TABLET | Freq: Once | ORAL | Status: AC
Start: 2017-08-02 — End: 2017-08-02
  Administered 2017-08-02: 650 mg via ORAL

## 2017-08-02 MED ORDER — DEXAMETHASONE SODIUM PHOSPHATE 10 MG/ML IJ SOLN
INTRAMUSCULAR | Status: AC
  Filled 2017-08-02: qty 1

## 2017-08-02 MED ORDER — SODIUM CHLORIDE 0.9 % IV SOLN
75.0000 mg/m2 | Freq: Once | INTRAVENOUS | Status: AC
  Administered 2017-08-02: 130 mg via INTRAVENOUS
  Filled 2017-08-02: qty 13

## 2017-08-02 MED ORDER — DEXAMETHASONE SODIUM PHOSPHATE 10 MG/ML IJ SOLN
10.0000 mg | Freq: Once | INTRAMUSCULAR | Status: AC
  Administered 2017-08-02: 10 mg via INTRAVENOUS

## 2017-08-02 MED ORDER — ACETAMINOPHEN 325 MG PO TABS
ORAL_TABLET | ORAL | Status: AC
  Filled 2017-08-02: qty 2

## 2017-08-02 MED ORDER — SODIUM CHLORIDE 0.9 % IV SOLN
Freq: Once | INTRAVENOUS | Status: AC
  Administered 2017-08-02: 11:00:00 via INTRAVENOUS

## 2017-08-02 MED ORDER — PEGFILGRASTIM 6 MG/0.6ML ~~LOC~~ PSKT
PREFILLED_SYRINGE | SUBCUTANEOUS | Status: AC
  Filled 2017-08-02: qty 0.6

## 2017-08-02 MED ORDER — PEGFILGRASTIM 6 MG/0.6ML ~~LOC~~ PSKT
6.0000 mg | PREFILLED_SYRINGE | Freq: Once | SUBCUTANEOUS | Status: AC
  Administered 2017-08-02: 6 mg via SUBCUTANEOUS

## 2017-08-02 MED ORDER — SODIUM CHLORIDE 0.9 % IV SOLN
10.0000 mg/kg | Freq: Once | INTRAVENOUS | Status: AC
  Administered 2017-08-02: 600 mg via INTRAVENOUS
  Filled 2017-08-02: qty 10

## 2017-08-02 MED ORDER — DIPHENHYDRAMINE HCL 50 MG/ML IJ SOLN
50.0000 mg | Freq: Once | INTRAMUSCULAR | Status: AC
  Administered 2017-08-02: 50 mg via INTRAVENOUS

## 2017-08-02 MED ORDER — SODIUM CHLORIDE 0.9 % IV SOLN: 10.0000 mg | Freq: Once | INTRAVENOUS | Status: DC

## 2017-08-02 NOTE — Progress Notes (Signed)
MD ok'd to proceed w/o baseline urine protein today.  Will make sure to order w/ next tx. Kennith Center, Pharm.D., CPP 08/02/2017@11 :03 AM

## 2017-08-02 NOTE — Patient Instructions (Signed)
[name] Discharge Instructions for Patients Receiving Chemotherapy  Today you received the following chemotherapy agents Cyramza, Taxotere.   To help prevent nausea and vomiting after your treatment, we encourage you to take your nausea medication as prescribed.   Take one compazine tonight before bed (even if not feeling nauseous).   If you develop nausea and vomiting that is not controlled by your nausea medication, call the clinic.   BELOW ARE SYMPTOMS THAT SHOULD BE REPORTED IMMEDIATELY:  *FEVER GREATER THAN 100.5 F  *CHILLS WITH OR WITHOUT FEVER  NAUSEA AND VOMITING THAT IS NOT CONTROLLED WITH YOUR NAUSEA MEDICATION  *UNUSUAL SHORTNESS OF BREATH  *UNUSUAL BRUISING OR BLEEDING  TENDERNESS IN MOUTH AND THROAT WITH OR WITHOUT PRESENCE OF ULCERS  *URINARY PROBLEMS  *BOWEL PROBLEMS  UNUSUAL RASH Items with * indicate a potential emergency and should be followed up as soon as possible.  Feel free to call the clinic should you have any questions or concerns. The clinic phone number is [phone].  Please show the [place] at check-in to the Emergency Department and triage nurse.     Ramucirumab injection What is this medicine? RAMUCIRUMAB (ra mue SIR ue mab) is a monoclonal antibody. It is used to treat stomach cancer, colorectal cancer, or lung cancer. This medicine may be used for other purposes; ask your health care provider or pharmacist if you have questions. COMMON BRAND NAME(S): Cyramza What should I tell my health care provider before I take this medicine? They need to know if you have any of these conditions: -bleeding disorders -blood clots -heart disease, including heart failure, heart attack, or chest pain (angina) -high blood pressure -infection (especially a virus infection such as chickenpox, cold sores, or herpes) -protein in your urine -recent surgery -stroke -an unusual or allergic reaction to ramucirumab, other  medicines, foods, dyes, or preservatives -pregnant or trying to get pregnant -breast-feeding How should I use this medicine? This medicine is for infusion into a vein. It is given by a health care professional in a hospital or clinic setting. Talk to your pediatrician regarding the use of this medicine in children. Special care may be needed. Overdosage: If you think you have taken too much of this medicine contact a poison control center or emergency room at once. NOTE: This medicine is only for you. Do not share this medicine with others. What if I miss a dose? It is important not to miss your dose. Call your doctor or health care professional if you are unable to keep an appointment. What may interact with this medicine? Interactions have not been studied. This list may not describe all possible interactions. Give your health care provider a list of all the medicines, herbs, non-prescription drugs, or dietary supplements you use. Also tell them if you smoke, drink alcohol, or use illegal drugs. Some items may interact with your medicine. What should I watch for while using this medicine? Your condition will be monitored carefully while you are receiving this medicine. You will need to to check your blood pressure and have your blood and urine tested while you are taking this medicine. Your condition will be monitored carefully while you are receiving this medicine. This medicine may increase your risk to bruise or bleed. Call your doctor or health care professional if you notice any unusual bleeding. This medicine may rarely cause 'gastrointestinal perforation' (holes in the stomach, intestines or colon), a serious side effect requiring surgery to repair. This medicine should be started  at least 28 days following major surgery and the site of the surgery should be totally healed. Check with your doctor before scheduling dental work or surgery while you are receiving this treatment. Talk to your  doctor if you have recently had surgery or if you have a wound that has not healed. Do not become pregnant while taking this medicine or for 3 months after stopping it. Women should inform their doctor if they wish to become pregnant or think they might be pregnant. There is a potential for serious side effects to an unborn child. Talk to your health care professional or pharmacist for more information. What side effects may I notice from receiving this medicine? Side effects that you should report to your doctor or health care professional as soon as possible: -allergic reactions like skin rash, itching or hives, breathing problems, swelling of the face, lips, or tongue -signs of infection - fever or chills, cough, sore throat -chest pain or chest tightness -confusion -dizziness -feeling faint or lightheaded, falls -severe abdominal pain -severe nausea, vomiting -signs and symptoms of bleeding such as bloody or black, tarry stools; red or dark-brown urine; spitting up blood or brown material that looks like coffee grounds; red spots on the skin; unusual bruising or bleeding from the eye, gums, or nose -signs and symptoms of a blood clot such as breathing problems; changes in vision; chest pain; severe, sudden headache; pain, swelling, warmth in the leg; trouble speaking; sudden numbness or weakness of the face, arm or leg -symptoms of a stroke: change in mental awareness, inability to talk or move one side of the body -trouble walking, dizziness, loss of balance or coordination Side effects that usually do not require medical attention (report to your doctor or health care professional if they continue or are bothersome): -cold, clammy skin -constipation -diarrhea -headache -nausea, vomiting -stomach pain -unusually slow heartbeat -unusually weak or tired This list may not describe all possible side effects. Call your doctor for medical advice about side effects. You may report side effects  to FDA at 1-800-FDA-1088. Where should I keep my medicine? This drug is given in a hospital or clinic and will not be stored at home. NOTE: This sheet is a summary. It may not cover all possible information. If you have questions about this medicine, talk to your doctor, pharmacist, or health care provider.  2018 Elsevier/Gold Standard (2015-03-31 08:20:29)     Docetaxel injection What is this medicine? DOCETAXEL (doe se TAX el) is a chemotherapy drug. It targets fast dividing cells, like cancer cells, and causes these cells to die. This medicine is used to treat many types of cancers like breast cancer, certain stomach cancers, head and neck cancer, lung cancer, and prostate cancer. This medicine may be used for other purposes; ask your health care provider or pharmacist if you have questions. COMMON BRAND NAME(S): Docefrez, Taxotere What should I tell my health care provider before I take this medicine? They need to know if you have any of these conditions: -infection (especially a virus infection such as chickenpox, cold sores, or herpes) -liver disease -low blood counts, like low white cell, platelet, or red cell counts -an unusual or allergic reaction to docetaxel, polysorbate 80, other chemotherapy agents, other medicines, foods, dyes, or preservatives -pregnant or trying to get pregnant -breast-feeding How should I use this medicine? This drug is given as an infusion into a vein. It is administered in a hospital or clinic by a specially trained health care professional. Talk  to your pediatrician regarding the use of this medicine in children. Special care may be needed. Overdosage: If you think you have taken too much of this medicine contact a poison control center or emergency room at once. NOTE: This medicine is only for you. Do not share this medicine with others. What if I miss a dose? It is important not to miss your dose. Call your doctor or health care professional if you  are unable to keep an appointment. What may interact with this medicine? -cyclosporine -erythromycin -ketoconazole -medicines to increase blood counts like filgrastim, pegfilgrastim, sargramostim -vaccines Talk to your doctor or health care professional before taking any of these medicines: -acetaminophen -aspirin -ibuprofen -ketoprofen -naproxen This list may not describe all possible interactions. Give your health care provider a list of all the medicines, herbs, non-prescription drugs, or dietary supplements you use. Also tell them if you smoke, drink alcohol, or use illegal drugs. Some items may interact with your medicine. What should I watch for while using this medicine? Your condition will be monitored carefully while you are receiving this medicine. You will need important blood work done while you are taking this medicine. This drug may make you feel generally unwell. This is not uncommon, as chemotherapy can affect healthy cells as well as cancer cells. Report any side effects. Continue your course of treatment even though you feel ill unless your doctor tells you to stop. In some cases, you may be given additional medicines to help with side effects. Follow all directions for their use. Call your doctor or health care professional for advice if you get a fever, chills or sore throat, or other symptoms of a cold or flu. Do not treat yourself. This drug decreases your body's ability to fight infections. Try to avoid being around people who are sick. This medicine may increase your risk to bruise or bleed. Call your doctor or health care professional if you notice any unusual bleeding. This medicine may contain alcohol in the product. You may get drowsy or dizzy. Do not drive, use machinery, or do anything that needs mental alertness until you know how this medicine affects you. Do not stand or sit up quickly, especially if you are an older patient. This reduces the risk of dizzy or  fainting spells. Avoid alcoholic drinks. Do not become pregnant while taking this medicine. Women should inform their doctor if they wish to become pregnant or think they might be pregnant. There is a potential for serious side effects to an unborn child. Talk to your health care professional or pharmacist for more information. Do not breast-feed an infant while taking this medicine. What side effects may I notice from receiving this medicine? Side effects that you should report to your doctor or health care professional as soon as possible: -allergic reactions like skin rash, itching or hives, swelling of the face, lips, or tongue -low blood counts - This drug may decrease the number of white blood cells, red blood cells and platelets. You may be at increased risk for infections and bleeding. -signs of infection - fever or chills, cough, sore throat, pain or difficulty passing urine -signs of decreased platelets or bleeding - bruising, pinpoint red spots on the skin, black, tarry stools, nosebleeds -signs of decreased red blood cells - unusually weak or tired, fainting spells, lightheadedness -breathing problems -fast or irregular heartbeat -low blood pressure -mouth sores -nausea and vomiting -pain, swelling, redness or irritation at the injection site -pain, tingling, numbness in the hands  or feet -swelling of the ankle, feet, hands -weight gain Side effects that usually do not require medical attention (report to your doctor or health care professional if they continue or are bothersome): -bone pain -complete hair loss including hair on your head, underarms, pubic hair, eyebrows, and eyelashes -diarrhea -excessive tearing -changes in the color of fingernails -loosening of the fingernails -nausea -muscle pain -red flush to skin -sweating -weak or tired This list may not describe all possible side effects. Call your doctor for medical advice about side effects. You may report side  effects to FDA at 1-800-FDA-1088. Where should I keep my medicine? This drug is given in a hospital or clinic and will not be stored at home. NOTE: This sheet is a summary. It may not cover all possible information. If you have questions about this medicine, talk to your doctor, pharmacist, or health care provider.  2018 Elsevier/Gold Standard (2015-03-31 12:32:56)

## 2017-08-07 ENCOUNTER — Inpatient Hospital Stay: Payer: Medicare Other

## 2017-08-14 ENCOUNTER — Inpatient Hospital Stay: Payer: Medicare Other | Attending: Internal Medicine

## 2017-08-14 DIAGNOSIS — J439 Emphysema, unspecified: Secondary | ICD-10-CM | POA: Diagnosis not present

## 2017-08-14 DIAGNOSIS — Z5189 Encounter for other specified aftercare: Secondary | ICD-10-CM | POA: Diagnosis not present

## 2017-08-14 DIAGNOSIS — E876 Hypokalemia: Secondary | ICD-10-CM | POA: Diagnosis not present

## 2017-08-14 DIAGNOSIS — C3412 Malignant neoplasm of upper lobe, left bronchus or lung: Secondary | ICD-10-CM | POA: Insufficient documentation

## 2017-08-14 DIAGNOSIS — I700 Atherosclerosis of aorta: Secondary | ICD-10-CM | POA: Diagnosis not present

## 2017-08-14 DIAGNOSIS — Z5112 Encounter for antineoplastic immunotherapy: Secondary | ICD-10-CM | POA: Insufficient documentation

## 2017-08-14 DIAGNOSIS — Z923 Personal history of irradiation: Secondary | ICD-10-CM | POA: Diagnosis not present

## 2017-08-14 DIAGNOSIS — Z9221 Personal history of antineoplastic chemotherapy: Secondary | ICD-10-CM | POA: Diagnosis not present

## 2017-08-14 DIAGNOSIS — C3492 Malignant neoplasm of unspecified part of left bronchus or lung: Secondary | ICD-10-CM

## 2017-08-14 LAB — CBC WITH DIFFERENTIAL (CANCER CENTER ONLY)
Basophils Absolute: 0 10*3/uL (ref 0.0–0.1)
Basophils Relative: 0 %
EOS ABS: 0 10*3/uL (ref 0.0–0.5)
Eosinophils Relative: 0 %
HCT: 34.8 % — ABNORMAL LOW (ref 38.4–49.9)
HEMOGLOBIN: 11 g/dL — AB (ref 13.0–17.1)
LYMPHS ABS: 1.1 10*3/uL (ref 0.9–3.3)
Lymphocytes Relative: 6 %
MCH: 29.4 pg (ref 27.2–33.4)
MCHC: 31.6 g/dL — ABNORMAL LOW (ref 32.0–36.0)
MCV: 93 fL (ref 79.3–98.0)
Monocytes Absolute: 1 10*3/uL — ABNORMAL HIGH (ref 0.1–0.9)
Monocytes Relative: 5 %
NEUTROS ABS: 16.6 10*3/uL — AB (ref 1.5–6.5)
NEUTROS PCT: 89 %
Platelet Count: 186 10*3/uL (ref 140–400)
RBC: 3.74 MIL/uL — AB (ref 4.20–5.82)
RDW: 16.6 % — ABNORMAL HIGH (ref 11.0–14.6)
WBC: 18.8 10*3/uL — AB (ref 4.0–10.3)

## 2017-08-14 LAB — CMP (CANCER CENTER ONLY)
ALT: 10 U/L (ref 0–55)
ANION GAP: 11 (ref 3–11)
AST: 22 U/L (ref 5–34)
Albumin: 3.2 g/dL — ABNORMAL LOW (ref 3.5–5.0)
Alkaline Phosphatase: 158 U/L — ABNORMAL HIGH (ref 40–150)
BUN: 17 mg/dL (ref 7–26)
CHLORIDE: 104 mmol/L (ref 98–109)
CO2: 23 mmol/L (ref 22–29)
Calcium: 9.3 mg/dL (ref 8.4–10.4)
Creatinine: 1 mg/dL (ref 0.70–1.30)
Glucose, Bld: 91 mg/dL (ref 70–140)
POTASSIUM: 3.8 mmol/L (ref 3.5–5.1)
Sodium: 138 mmol/L (ref 136–145)
Total Bilirubin: <0.2 mg/dL — ABNORMAL LOW (ref 0.2–1.2)
Total Protein: 7.1 g/dL (ref 6.4–8.3)

## 2017-08-21 ENCOUNTER — Inpatient Hospital Stay: Payer: Medicare Other

## 2017-08-21 ENCOUNTER — Inpatient Hospital Stay (HOSPITAL_BASED_OUTPATIENT_CLINIC_OR_DEPARTMENT_OTHER): Payer: Medicare Other | Admitting: Adult Health

## 2017-08-21 ENCOUNTER — Encounter: Payer: Self-pay | Admitting: Adult Health

## 2017-08-21 VITALS — BP 111/75 | HR 69 | Temp 97.7°F | Resp 18 | Ht 75.0 in | Wt 121.7 lb

## 2017-08-21 DIAGNOSIS — C3492 Malignant neoplasm of unspecified part of left bronchus or lung: Secondary | ICD-10-CM

## 2017-08-21 DIAGNOSIS — Z9221 Personal history of antineoplastic chemotherapy: Secondary | ICD-10-CM | POA: Diagnosis not present

## 2017-08-21 DIAGNOSIS — J431 Panlobular emphysema: Secondary | ICD-10-CM

## 2017-08-21 DIAGNOSIS — Z923 Personal history of irradiation: Secondary | ICD-10-CM | POA: Diagnosis not present

## 2017-08-21 DIAGNOSIS — C3412 Malignant neoplasm of upper lobe, left bronchus or lung: Secondary | ICD-10-CM

## 2017-08-21 DIAGNOSIS — E876 Hypokalemia: Secondary | ICD-10-CM

## 2017-08-21 DIAGNOSIS — J439 Emphysema, unspecified: Secondary | ICD-10-CM

## 2017-08-21 DIAGNOSIS — Z5112 Encounter for antineoplastic immunotherapy: Secondary | ICD-10-CM | POA: Diagnosis not present

## 2017-08-21 DIAGNOSIS — I700 Atherosclerosis of aorta: Secondary | ICD-10-CM | POA: Diagnosis not present

## 2017-08-21 LAB — CBC WITH DIFFERENTIAL (CANCER CENTER ONLY)
Basophils Absolute: 0 10*3/uL (ref 0.0–0.1)
Basophils Relative: 0 %
EOS PCT: 0 %
Eosinophils Absolute: 0 10*3/uL (ref 0.0–0.5)
HCT: 34.2 % — ABNORMAL LOW (ref 38.4–49.9)
Hemoglobin: 11 g/dL — ABNORMAL LOW (ref 13.0–17.1)
LYMPHS ABS: 0.5 10*3/uL — AB (ref 0.9–3.3)
Lymphocytes Relative: 5 %
MCH: 29.7 pg (ref 27.2–33.4)
MCHC: 32.2 g/dL (ref 32.0–36.0)
MCV: 92.1 fL (ref 79.3–98.0)
MONO ABS: 0.2 10*3/uL (ref 0.1–0.9)
Monocytes Relative: 2 %
Neutro Abs: 8 10*3/uL — ABNORMAL HIGH (ref 1.5–6.5)
Neutrophils Relative %: 93 %
PLATELETS: 383 10*3/uL (ref 140–400)
RBC: 3.72 MIL/uL — ABNORMAL LOW (ref 4.20–5.82)
RDW: 17.5 % — AB (ref 11.0–14.6)
WBC Count: 8.6 10*3/uL (ref 4.0–10.3)

## 2017-08-21 LAB — CMP (CANCER CENTER ONLY)
ALBUMIN: 3.2 g/dL — AB (ref 3.5–5.0)
ALT: 9 U/L (ref 0–55)
AST: 16 U/L (ref 5–34)
Alkaline Phosphatase: 107 U/L (ref 40–150)
Anion gap: 11 (ref 3–11)
BUN: 23 mg/dL (ref 7–26)
CHLORIDE: 103 mmol/L (ref 98–109)
CO2: 26 mmol/L (ref 22–29)
Calcium: 9.8 mg/dL (ref 8.4–10.4)
Creatinine: 1.09 mg/dL (ref 0.70–1.30)
GFR, Est AFR Am: >60 mL/min (ref >60)
GFR, Estimated: >60 mL/min (ref >60)
GLUCOSE: 114 mg/dL (ref 70–140)
POTASSIUM: 3.2 mmol/L — AB (ref 3.5–5.1)
Sodium: 140 mmol/L (ref 136–145)
Total Bilirubin: 0.2 mg/dL (ref 0.2–1.2)
Total Protein: 7.6 g/dL (ref 6.4–8.3)

## 2017-08-21 LAB — TOTAL PROTEIN, URINE DIPSTICK

## 2017-08-21 MED ORDER — SODIUM CHLORIDE 0.9 % IV SOLN
75.0000 mg/m2 | Freq: Once | INTRAVENOUS | Status: AC
  Administered 2017-08-21: 130 mg via INTRAVENOUS
  Filled 2017-08-21: qty 13

## 2017-08-21 MED ORDER — POTASSIUM CHLORIDE ER 10 MEQ PO TBCR: 10.0000 meq | EXTENDED_RELEASE_TABLET | Freq: Every day | ORAL | 0 refills | Status: DC

## 2017-08-21 MED ORDER — ACETAMINOPHEN 325 MG PO TABS
ORAL_TABLET | ORAL | Status: AC
  Filled 2017-08-21: qty 2

## 2017-08-21 MED ORDER — ACETAMINOPHEN 325 MG PO TABS
650.0000 mg | ORAL_TABLET | Freq: Once | ORAL | Status: AC
  Administered 2017-08-21: 650 mg via ORAL

## 2017-08-21 MED ORDER — SODIUM CHLORIDE 0.9 % IV SOLN
Freq: Once | INTRAVENOUS | Status: AC
  Administered 2017-08-21: 11:00:00 via INTRAVENOUS

## 2017-08-21 MED ORDER — DIPHENHYDRAMINE HCL 50 MG/ML IJ SOLN
50.0000 mg | Freq: Once | INTRAMUSCULAR | Status: AC
  Administered 2017-08-21: 50 mg via INTRAVENOUS

## 2017-08-21 MED ORDER — DEXAMETHASONE SODIUM PHOSPHATE 10 MG/ML IJ SOLN
INTRAMUSCULAR | Status: AC
  Filled 2017-08-21: qty 1

## 2017-08-21 MED ORDER — PEGFILGRASTIM 6 MG/0.6ML ~~LOC~~ PSKT
6.0000 mg | PREFILLED_SYRINGE | Freq: Once | SUBCUTANEOUS | Status: AC
  Administered 2017-08-21: 6 mg via SUBCUTANEOUS

## 2017-08-21 MED ORDER — SODIUM CHLORIDE 0.9 % IV SOLN
10.0000 mg/kg | Freq: Once | INTRAVENOUS | Status: AC
  Administered 2017-08-21: 600 mg via INTRAVENOUS
  Filled 2017-08-21: qty 50

## 2017-08-21 MED ORDER — DEXAMETHASONE SODIUM PHOSPHATE 10 MG/ML IJ SOLN
10.0000 mg | Freq: Once | INTRAMUSCULAR | Status: AC
  Administered 2017-08-21: 10 mg via INTRAVENOUS

## 2017-08-21 MED ORDER — PEGFILGRASTIM 6 MG/0.6ML ~~LOC~~ PSKT
PREFILLED_SYRINGE | SUBCUTANEOUS | Status: AC
  Filled 2017-08-21: qty 0.6

## 2017-08-21 MED ORDER — DIPHENHYDRAMINE HCL 50 MG/ML IJ SOLN
INTRAMUSCULAR | Status: AC
  Filled 2017-08-21: qty 1

## 2017-08-21 NOTE — Assessment & Plan Note (Addendum)
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    [name] tells [place] he is tolerating treatment well.  His CBC is stable.  He has slight hypokalemia, will send in [facility].  Due to his questionable neuro status, I had Dr. Julien Nordmann come over to evaluate him, in order to discern if the issues noted during his interview and exam were normal for the patient, or if he has had a change in mental status.  Dr. Julien Nordmann came and evaluated his cognitive status at my request..  [name] is at his baseline.  He will have a port placed to receive future treatment.  He will proceed with treatment today.    Will note that he has aortic atherosclerosis, and emphysema on his recent CT chest.  Patient is aware.  [name] has appointments to return for weekly labs, and will return on 09/10/17 for labs, f/u with Ned Card NP, and his next treatment.

## 2017-08-21 NOTE — Patient Instructions (Signed)
[facility] Discharge Instructions for Patients Receiving Chemotherapy  Today you received the following chemotherapy agents: Cyramza, Taxotere  To help prevent nausea and vomiting after your treatment, we encourage you to take your nausea medication as directed.    If you develop nausea and vomiting that is not controlled by your nausea medication, call the clinic.   BELOW ARE SYMPTOMS THAT SHOULD BE REPORTED IMMEDIATELY:  *FEVER GREATER THAN 100.5 F  *CHILLS WITH OR WITHOUT FEVER  NAUSEA AND VOMITING THAT IS NOT CONTROLLED WITH YOUR NAUSEA MEDICATION  *UNUSUAL SHORTNESS OF BREATH  *UNUSUAL BRUISING OR BLEEDING  TENDERNESS IN MOUTH AND THROAT WITH OR WITHOUT PRESENCE OF ULCERS  *URINARY PROBLEMS  *BOWEL PROBLEMS  UNUSUAL RASH Items with * indicate a potential emergency and should be followed up as soon as possible.  Feel free to call the clinic should you have any questions or concerns. The clinic phone number is [phone].  Please show the CHEMO ALERT CARD at check-in to the Emergency Department and triage nurse.   

## 2017-08-21 NOTE — Progress Notes (Addendum)
Adrian Cancer Follow up:    Charolette Forward, MD [phone] [place] [address]   DIAGNOSIS: Cancer Staging Lung cancer [facility]) Staging form: Lung, AJCC 8th Edition - Clinical stage from 05/03/2016: Stage IIB (cT2b, cN1, cM0) - Signed by Grace Isaac, MD on 05/03/2016   SUMMARY OF ONCOLOGIC HISTORY: Oncology History   Patient presented with syncopal episode to ED.  Work up showed left lung mass.   Cancer Staging Lung cancer [facility]) Staging form: Lung, AJCC 8th Edition - Clinical stage from 05/03/2016: Stage IIB (cT2b, cN1, cM0) - Signed by Grace Isaac, MD on 05/03/2016       Lung cancer ([name])   03/28/2016 Imaging    CXR IMPRESSION: New masslike density LEFT upper lobe for which CT chest with contrast is recommended.       03/28/2016 Imaging    CT Chest IMPRESSION: 4.7 cm spiculated cavitary mass left upper lobe consistent with neoplasm.       03/29/2016 Imaging    MRI Brain IMPRESSION:  No acute intracranial abnormality identified. No abnormal enhancement of the brain.      04/12/2016 Surgery    Bronchoscopy  Left upper lobe mass      04/12/2016 Pathology Results    Lung, transbronchial biopsy, Left Upper Lobe - INVASIVE SQUAMOUS CELL CARCINOMA      04/13/2016 Initial Diagnosis    Squamous cell carcinoma      04/25/2016 Imaging    PET IMPRESSION:  Centrally necrotic 4.8 cm hypermetabolic left upper lobe mass, appearance favors malignancy such as squamous cell carcinoma. There is a small hypermetabolic AP window/left hilar region lymph node compatible with early nodal metastatic disease. No other metastatic lesions identified. Active granulomatous disease could have a similar appearance but is considered much less likely. The 5 mm pulmonary nodule in the left lower lobe and 3 mm right lower lobe pulmonary nodule are stable from 2013 and considered benign.      08/02/2017 -  Chemotherapy    The patient had  pegfilgrastim (NEULASTA ONPRO KIT) injection 6 mg, 6 mg, Subcutaneous, Once, 1 of 6 cycles Administration: 6 mg (08/02/2017) DOCEtaxel (TAXOTERE) 130 mg in sodium chloride 0.9 % 250 mL chemo infusion, 75 mg/m2 = 130 mg, Intravenous,  Once, 1 of 6 cycles Administration: 130 mg (08/02/2017) ramucirumab (CYRAMZA) 600 mg in sodium chloride 0.9 % 190 mL chemo infusion, 10 mg/kg = 600 mg, Intravenous, Once, 1 of 6 cycles Administration: 600 mg (08/02/2017)  for chemotherapy treatment.        Squamous cell lung cancer, left ([name])   06/13/2016 Initial Diagnosis    Squamous cell lung cancer, left ([place])      08/02/2017 -  Chemotherapy    The patient had pegfilgrastim (NEULASTA ONPRO KIT) injection 6 mg, 6 mg, Subcutaneous, Once, 1 of 6 cycles Administration: 6 mg (08/02/2017) DOCEtaxel (TAXOTERE) 130 mg in sodium chloride 0.9 % 250 mL chemo infusion, 75 mg/m2 = 130 mg, Intravenous,  Once, 1 of 6 cycles Administration: 130 mg (08/02/2017) ramucirumab (CYRAMZA) 600 mg in sodium chloride 0.9 % 190 mL chemo infusion, 10 mg/kg = 600 mg, Intravenous, Once, 1 of 6 cycles Administration: 600 mg (08/02/2017)  for chemotherapy treatment.        Stage IV squamous cell carcinoma of left lung ([name])   07/23/2017 Initial Diagnosis    Stage IV squamous cell carcinoma of left lung ([name])      08/02/2017 -  Chemotherapy  The patient had pegfilgrastim (NEULASTA ONPRO KIT) injection 6 mg, 6 mg, Subcutaneous, Once, 1 of 6 cycles Administration: 6 mg (08/02/2017) DOCEtaxel (TAXOTERE) 130 mg in sodium chloride 0.9 % 250 mL chemo infusion, 75 mg/m2 = 130 mg, Intravenous,  Once, 1 of 6 cycles Administration: 130 mg (08/02/2017) ramucirumab (CYRAMZA) 600 mg in sodium chloride 0.9 % 190 mL chemo infusion, 10 mg/kg = 600 mg, Intravenous, Once, 1 of 6 cycles Administration: 600 mg (08/02/2017)  for chemotherapy treatment.        CURRENT THERAPY: cycle 2 Taxotere and Cyramza  INTERVAL HISTORY: [name] 67 y.o. male  returns for evaluation prior to receiving his second cycle of chemotherapy with Taxotere and Cyramza.  He tells me that he is tolerating it well.  In the middle of our discussion, [name] looked in a different direction and starting talking about something else and I couldn't understand what he was saying.  [name] tells me that he lives with his sister and his nephew, and that he rode the bus to get here.     Patient Active Problem List   Diagnosis Date Noted  . Aortic atherosclerosis ([place]) 08/21/2017  . Emphysema of lung ([place]) 08/21/2017  . Stage IV squamous cell carcinoma of left lung ([name]) 07/23/2017  . Encounter for antineoplastic immunotherapy 09/05/2016  . Drug-induced skin rash 09/05/2016  . Esophagitis 07/02/2016  . Squamous cell lung cancer, left ([name]) 06/13/2016  . Goals of care, counseling/discussion 06/13/2016  . Lung cancer ([name]) 04/13/2016  . Syncope 03/28/2016  . Near syncope 03/28/2016  . Smoker 05/28/2014  . Abdominal aneurysm without mention of rupture 08/31/2011  . Aneurysm of iliac artery ([place]) 08/31/2011  . Preop cardiovascular exam   . Abdominal aortic aneurysm ([place])   . Lung mass   . Hypertension   . Hyperlipidemia   . CAD (coronary artery disease)   . Hx of CABG   . Ejection fraction   . Tobacco abuse   . Iliac artery aneurysm, bilateral ([place]) 06/22/2011  . Fatigue 04/23/2011  . PSA elevation 04/23/2011  . Encounter for antineoplastic chemotherapy 04/21/2011  . COPD (chronic obstructive pulmonary disease) ([place]) 04/21/2011  . Hyperplastic colon polyp 04/21/2011  . PVD (peripheral vascular disease) ([place]) 04/21/2011  . Gastritis and duodenitis 04/21/2011  . Chronic mental illness 04/21/2011  . CONSTIPATION 11/12/2008  . CORONARY ARTERY BYPASS GRAFT, HX OF 11/09/2008  . CARDIOMYOPATHY, ISCHEMIC 08/23/2008  . HEMOCCULT POSITIVE STOOL 08/23/2008    is allergic to ace inhibitors.  MEDICAL HISTORY: Past Medical History:  Diagnosis Date  . AAA (abdominal  aortic aneurysm) ([place])   . Abdominal aortic aneurysm ([place])    AAA And bilateral common iliac artery aneurysms  . BPH (benign prostatic hyperplasia)   . CAD (coronary artery disease)    Dr Ron Parker @ [facility]   . Chronic mental illness 04/21/2011   Diagnosis unclear,  Family provides good care  . COPD (chronic obstructive pulmonary disease) ([place]) 04/21/2011   smoking  . Drug-induced skin rash 09/05/2016  . Ejection fraction    EF 35-40%, echo, May, 2010  . Hyperlipidemia   . Hyperplastic colon polyp 04/21/2011  . Hypertension   . Preop cardiovascular exam    Cardiac clearance for major vascular surgery, May, 2013  . Pulmonary nodule    Chest CT May, 2013, 2 small pulmonary nodules, needs appropriate followup,  this CT was not ordered by the cardiology team  . PVD (peripheral vascular disease) ([place]) 04/21/2011  . Retention of urine   .  Shortness of breath   . Squamous cell lung cancer, left ([place]) 06/13/2016  . Tobacco abuse     SURGICAL HISTORY: Past Surgical History:  Procedure Laterality Date  . ABDOMINAL AORTA STENT  07/2011  . ABDOMINAL AORTAGRAM N/A 07/26/2011   Procedure: ABDOMINAL Maxcine Ham;  Surgeon: Conrad Cassville, MD;  Location: [facility] CATH LAB;  Service: Cardiovascular;  Laterality: N/A;  . ABDOMINAL AORTIC ANEURYSM REPAIR  07/2011  . CORONARY ARTERY BYPASS GRAFT  07/2008   CABG X5/notes 07/12/2010  . EMBOLIZATION Left 07/26/2011   Procedure: EMBOLIZATION;  Surgeon: Conrad Anselmo, MD;  Location: [facility] CATH LAB;  Service: Cardiovascular;  Laterality: Left;  . TRANSURETHRAL RESECTION OF PROSTATE  11/29/2011   Procedure: TRANSURETHRAL RESECTION OF THE PROSTATE WITH GYRUS INSTRUMENTS;  Surgeon: Malka So, MD;  Location: [facility] ORS;  Service: Urology;  Laterality: N/A;  Prostate Ultrasound, and Biopsy  . VIDEO BRONCHOSCOPY Bilateral 04/12/2016   Procedure: VIDEO BRONCHOSCOPY WITH FLUORO;  Surgeon: Juanito Doom, MD;  Location: [facility];  Service: Cardiopulmonary;  Laterality: Bilateral;    SOCIAL  HISTORY: Social History   Socioeconomic History  . Marital status: Single    Spouse name: Not on file  . Number of children: Not on file  . Years of education: 16  . Highest education level: Not on file  Occupational History  . Occupation: retired  Scientific laboratory technician  . Financial resource strain: Not on file  . Food insecurity:    Worry: Not on file    Inability: Not on file  . Transportation needs:    Medical: Not on file    Non-medical: Not on file  Tobacco Use  . Smoking status: Current Every Day Smoker    Packs/day: 1.00    Years: 50.00    Pack years: 50.00    Types: Cigarettes  . Smokeless tobacco: Never Used  Substance and Sexual Activity  . Alcohol use: No  . Drug use: No  . Sexual activity: Never  Lifestyle  . Physical activity:    Days per week: Not on file    Minutes per session: Not on file  . Stress: Not on file  Relationships  . Social connections:    Talks on phone: Not on file    Gets together: Not on file    Attends religious service: Not on file    Active member of club or organization: Not on file    Attends meetings of clubs or organizations: Not on file    Relationship status: Not on file  . Intimate partner violence:    Fear of current or ex partner: Not on file    Emotionally abused: Not on file    Physically abused: Not on file    Forced sexual activity: Not on file  Other Topics Concern  . Not on file  Social History Narrative  . Not on file    FAMILY HISTORY: Family History  Problem Relation Age of Onset  . Cancer Father        prostate  . Heart disease Father   . Diabetes Father   . Heart attack Father 58  . Arthritis Sister   . Hyperlipidemia Sister   . Hypertension Sister   . Anesthesia problems Neg Hx     Review of Systems  Constitutional: Negative for appetite change, chills, fatigue, fever and unexpected weight change.  HENT:   Negative for hearing loss, lump/mass, sore throat and trouble swallowing.   Eyes: Negative for  eye problems and  icterus.  Respiratory: Positive for shortness of breath (could not go into detail). Negative for chest tightness and cough.   Cardiovascular: Negative for chest pain, leg swelling and palpitations.  Gastrointestinal: Negative for abdominal distention, abdominal pain, constipation, diarrhea, nausea and vomiting.  Endocrine: Negative for hot flashes.  Neurological: Negative for dizziness, extremity weakness, headaches and numbness.  Hematological: Negative for adenopathy. Does not bruise/bleed easily.  Psychiatric/Behavioral: Negative for depression. The patient is not nervous/anxious.       PHYSICAL EXAMINATION  ECOG PERFORMANCE STATUS: 2 - Symptomatic, <50% confined to bed  Vitals:   08/21/17 1008  BP: 111/75  Pulse: 69  Resp: 18  Temp: 97.7 F (36.5 C)  SpO2: 96%    Physical Exam  Constitutional: He is oriented to person, place, and time. No distress.  cachectic  HENT:  Head: Normocephalic and atraumatic.  Mouth/Throat: Oropharynx is clear and moist.  Eyes: Pupils are equal, round, and reactive to light. No scleral icterus.  Neck: Neck supple.  Cardiovascular: Normal rate, regular rhythm and normal heart sounds.  Pulmonary/Chest: Effort normal and breath sounds normal.  Unable to listen completely, he would take a breath, and then start talking, despite me asking him not to talk and take a few slow deep breaths    Abdominal: Soft. Bowel sounds are normal. He exhibits no distension and no mass. There is no tenderness. There is no rebound and no guarding.  Lymphadenopathy:    He has cervical adenopathy (left cervical chain adenopathy).  Neurological: He is alert and oriented to person, place, and time.  Skin: Skin is warm and dry.  Psychiatric:  Patient behavior is inappropriate considering situation, he started talking to other people in the room who were not there, his speech is occasionally incomprehensible, and he was unable to follow directions for  more than 3-5 seconds    LABORATORY DATA:  CBC    Component Value Date/Time   WBC 8.6 08/21/2017 0926   WBC 3.6 (L) 07/19/2017 1033   RBC 3.72 (L) 08/21/2017 0926   HGB 11.0 (L) 08/21/2017 0926   HGB 12.5 (L) 03/13/2017 1059   HCT 34.2 (L) 08/21/2017 0926   HCT 38.1 (L) 03/13/2017 1059   PLT 383 08/21/2017 0926   PLT 218 03/13/2017 1059   MCV 92.1 08/21/2017 0926   MCV 91.5 03/13/2017 1059   MCH 29.7 08/21/2017 0926   MCHC 32.2 08/21/2017 0926   RDW 17.5 (H) 08/21/2017 0926   RDW 15.7 (H) 03/13/2017 1059   LYMPHSABS 0.5 (L) 08/21/2017 0926   LYMPHSABS 0.7 (L) 03/13/2017 1059   MONOABS 0.2 08/21/2017 0926   MONOABS 0.6 03/13/2017 1059   EOSABS 0.0 08/21/2017 0926   EOSABS 0.1 03/13/2017 1059   BASOSABS 0.0 08/21/2017 0926   BASOSABS 0.1 03/13/2017 1059    CMP     Component Value Date/Time   NA 140 08/21/2017 0926   NA 139 03/13/2017 1059   K 3.2 (L) 08/21/2017 0926   K 3.9 03/13/2017 1059   CL 103 08/21/2017 0926   CO2 26 08/21/2017 0926   CO2 26 03/13/2017 1059   GLUCOSE 114 08/21/2017 0926   GLUCOSE 78 03/13/2017 1059   BUN 23 08/21/2017 0926   BUN 18.6 03/13/2017 1059   CREATININE 1.09 08/21/2017 0926   CREATININE 1.2 03/13/2017 1059   CALCIUM 9.8 08/21/2017 0926   CALCIUM 8.9 03/13/2017 1059   PROT 7.6 08/21/2017 0926   PROT 7.4 03/13/2017 1059   ALBUMIN 3.2 (L) 08/21/2017 9163  ALBUMIN 3.4 (L) 03/13/2017 1059   AST 16 08/21/2017 0926   AST 16 03/13/2017 1059   ALT 9 08/21/2017 0926   ALT 14 03/13/2017 1059   ALKPHOS 107 08/21/2017 0926   ALKPHOS 118 03/13/2017 1059   BILITOT 0.2 08/21/2017 0926   BILITOT 0.36 03/13/2017 1059   GFRNONAA >60 08/21/2017 0926   GFRAA >60 08/21/2017 0926       RADIOGRAPHIC STUDIES:     ASSESSMENT and PLAN:   Stage IV squamous cell carcinoma of left lung ([name]) [name] is a 67 year old man who has stage IV squamous cell carcinoma of the left lung currently on treatment with Taxotere and Cyramza.    [name]  tells [place] he is tolerating treatment well.  His CBC is stable.  He has slight hypokalemia, will send in [name].  Due to his questionable neuro status, I had Dr. Julien Nordmann come over to evaluate him, in order to discern if the issues noted during his interview and exam were normal for the patient, or if he has had a change in mental status.  Dr. Julien Nordmann came and evaluated his cognitive status at my request..  [name] is at his baseline.  He will have a port placed to receive future treatment.  He will proceed with treatment today.    Will note that he has aortic atherosclerosis, and emphysema on his recent CT chest.  Patient is aware.  [name] has appointments to return for weekly labs, and will return on 09/10/17 for labs, f/u with Adrian Card NP, and his next treatment.    All questions were answered. The patient knows to call the clinic with any problems, questions or concerns. We can certainly see the patient much sooner if necessary.  This note was electronically signed. Scot Dock, NP 08/21/2017   ADDENDUM: Hematology/Oncology Attending: I had a face-to-face encounter with the patient today.  I recommended his care plan.  This is a very pleasant 67 years old African-American male with a stage IV non-small cell lung cancer, squamous cell carcinoma.  This was initially diagnosed as a stage IIIa non-small cell lung cancer, squamous cell carcinoma.  He underwent a course of concurrent chemoradiation with weekly carboplatin and paclitaxel followed by immunotherapy continued secondary to significant skin rash and itching.  The patient had evidence for disease progression and he was a started on systemic chemotherapy with carboplatin and paclitaxel for 6 cycles discontinued secondary to disease progression.  He is currently undergoing second line chemotherapy with docetaxel and Cyramza status post 1 cycle.  He tolerated the first cycle of this treatment well with no concerning complaints.  I recommended for  the patient to proceed with cycle #2 today as a scheduled.  For IV access, we will arrange for the patient to have Port-A-Cath placed before the next treatment. He will come back for follow-up visit in 3 weeks before starting cycle #3. He was advised to call immediately if he has any concerning symptoms in the interval. Disclaimer: This note was dictated with voice recognition software. Similar sounding words can inadvertently be transcribed and may be missed upon review. Eilleen Kempf, MD 08/21/17

## 2017-08-21 NOTE — Progress Notes (Signed)
Per Dr. Julien Nordmann, proceed w/ Cyramza given plan w/ port placement.  May delay port placement by a week so that it is not within 7-10 day window of administration of Cyramza.  Port placement is not urgent per MD. Kennith Center, Pharm.D., CPP 08/21/2017@12 :17 PM

## 2017-08-28 ENCOUNTER — Inpatient Hospital Stay: Payer: Medicare Other

## 2017-08-28 DIAGNOSIS — Z5112 Encounter for antineoplastic immunotherapy: Secondary | ICD-10-CM | POA: Diagnosis not present

## 2017-08-28 DIAGNOSIS — Z9221 Personal history of antineoplastic chemotherapy: Secondary | ICD-10-CM | POA: Diagnosis not present

## 2017-08-28 DIAGNOSIS — J439 Emphysema, unspecified: Secondary | ICD-10-CM | POA: Diagnosis not present

## 2017-08-28 DIAGNOSIS — C3492 Malignant neoplasm of unspecified part of left bronchus or lung: Secondary | ICD-10-CM

## 2017-08-28 DIAGNOSIS — I700 Atherosclerosis of aorta: Secondary | ICD-10-CM | POA: Diagnosis not present

## 2017-08-28 DIAGNOSIS — C3412 Malignant neoplasm of upper lobe, left bronchus or lung: Secondary | ICD-10-CM | POA: Diagnosis not present

## 2017-08-28 DIAGNOSIS — E876 Hypokalemia: Secondary | ICD-10-CM | POA: Diagnosis not present

## 2017-08-28 LAB — CBC WITH DIFFERENTIAL (CANCER CENTER ONLY)
BASOS ABS: 0 10*3/uL (ref 0.0–0.1)
Basophils Relative: 0 %
EOS PCT: 1 %
Eosinophils Absolute: 0.1 10*3/uL (ref 0.0–0.5)
HCT: 34.5 % — ABNORMAL LOW (ref 38.4–49.9)
Hemoglobin: 10.9 g/dL — ABNORMAL LOW (ref 13.0–17.1)
LYMPHS ABS: 1.3 10*3/uL (ref 0.9–3.3)
LYMPHS PCT: 13 %
MCH: 29.3 pg (ref 27.2–33.4)
MCHC: 31.6 g/dL — AB (ref 32.0–36.0)
MCV: 92.7 fL (ref 79.3–98.0)
MONO ABS: 1.4 10*3/uL — AB (ref 0.1–0.9)
MONOS PCT: 14 %
NEUTROS ABS: 7.5 10*3/uL — AB (ref 1.5–6.5)
Neutrophils Relative %: 72 %
Platelet Count: 241 10*3/uL (ref 140–400)
RBC: 3.72 MIL/uL — ABNORMAL LOW (ref 4.20–5.82)
RDW: 17 % — AB (ref 11.0–14.6)
WBC Count: 10.4 10*3/uL — ABNORMAL HIGH (ref 4.0–10.3)

## 2017-08-28 LAB — CMP (CANCER CENTER ONLY)
ALT: 10 U/L (ref 0–55)
ANION GAP: 7 (ref 3–11)
AST: 18 U/L (ref 5–34)
Albumin: 3.2 g/dL — ABNORMAL LOW (ref 3.5–5.0)
Alkaline Phosphatase: 110 U/L (ref 40–150)
BUN: 17 mg/dL (ref 7–26)
CHLORIDE: 105 mmol/L (ref 98–109)
CO2: 27 mmol/L (ref 22–29)
Calcium: 9.5 mg/dL (ref 8.4–10.4)
Creatinine: 1.11 mg/dL (ref 0.70–1.30)
Glucose, Bld: 104 mg/dL (ref 70–140)
POTASSIUM: 3.4 mmol/L — AB (ref 3.5–5.1)
Sodium: 139 mmol/L (ref 136–145)
TOTAL PROTEIN: 7 g/dL (ref 6.4–8.3)

## 2017-08-29 ENCOUNTER — Ambulatory Visit: Payer: Medicare Other | Admitting: Nurse Practitioner

## 2017-08-29 ENCOUNTER — Ambulatory Visit: Payer: Medicare Other

## 2017-08-29 ENCOUNTER — Other Ambulatory Visit: Payer: Medicare Other

## 2017-08-31 ENCOUNTER — Ambulatory Visit: Payer: Medicare Other

## 2017-09-04 ENCOUNTER — Inpatient Hospital Stay: Payer: Medicare Other

## 2017-09-04 ENCOUNTER — Other Ambulatory Visit: Payer: Self-pay | Admitting: Student

## 2017-09-04 DIAGNOSIS — C3492 Malignant neoplasm of unspecified part of left bronchus or lung: Secondary | ICD-10-CM

## 2017-09-04 DIAGNOSIS — J439 Emphysema, unspecified: Secondary | ICD-10-CM | POA: Diagnosis not present

## 2017-09-04 DIAGNOSIS — E876 Hypokalemia: Secondary | ICD-10-CM | POA: Diagnosis not present

## 2017-09-04 DIAGNOSIS — I700 Atherosclerosis of aorta: Secondary | ICD-10-CM | POA: Diagnosis not present

## 2017-09-04 DIAGNOSIS — Z9221 Personal history of antineoplastic chemotherapy: Secondary | ICD-10-CM | POA: Diagnosis not present

## 2017-09-04 DIAGNOSIS — C3412 Malignant neoplasm of upper lobe, left bronchus or lung: Secondary | ICD-10-CM | POA: Diagnosis not present

## 2017-09-04 DIAGNOSIS — Z5112 Encounter for antineoplastic immunotherapy: Secondary | ICD-10-CM | POA: Diagnosis not present

## 2017-09-04 LAB — CMP (CANCER CENTER ONLY)
ALBUMIN: 3.1 g/dL — AB (ref 3.5–5.0)
ALT: 12 U/L (ref 0–44)
ANION GAP: 9 (ref 5–15)
AST: 19 U/L (ref 15–41)
Alkaline Phosphatase: 120 U/L (ref 38–126)
BUN: 14 mg/dL (ref 8–23)
CO2: 26 mmol/L (ref 22–32)
Calcium: 9.3 mg/dL (ref 8.9–10.3)
Chloride: 105 mmol/L (ref 98–111)
Creatinine: 0.98 mg/dL (ref 0.61–1.24)
GFR, Est AFR Am: >60 mL/min (ref >60)
GFR, Estimated: >60 mL/min (ref >60)
GLUCOSE: 97 mg/dL (ref 70–99)
POTASSIUM: 4.1 mmol/L (ref 3.5–5.1)
SODIUM: 140 mmol/L (ref 135–145)
Total Bilirubin: 0.3 mg/dL (ref 0.3–1.2)
Total Protein: 7.1 g/dL (ref 6.5–8.1)

## 2017-09-04 LAB — CBC WITH DIFFERENTIAL (CANCER CENTER ONLY)
BASOS ABS: 0.1 10*3/uL (ref 0.0–0.1)
BASOS PCT: 1 %
EOS ABS: 0.1 10*3/uL (ref 0.0–0.5)
Eosinophils Relative: 1 %
HEMATOCRIT: 36 % — AB (ref 38.4–49.9)
Hemoglobin: 11.6 g/dL — ABNORMAL LOW (ref 13.0–17.1)
Lymphocytes Relative: 9 %
Lymphs Abs: 0.9 10*3/uL (ref 0.9–3.3)
MCH: 29.3 pg (ref 27.2–33.4)
MCHC: 32.2 g/dL (ref 32.0–36.0)
MCV: 90.9 fL (ref 79.3–98.0)
MONO ABS: 0.7 10*3/uL (ref 0.1–0.9)
Monocytes Relative: 7 %
NEUTROS ABS: 9 10*3/uL — AB (ref 1.5–6.5)
Neutrophils Relative %: 82 %
PLATELETS: 254 10*3/uL (ref 140–400)
RBC: 3.96 MIL/uL — ABNORMAL LOW (ref 4.20–5.82)
RDW: 18.1 % — AB (ref 11.0–14.6)
WBC Count: 10.7 10*3/uL — ABNORMAL HIGH (ref 4.0–10.3)

## 2017-09-05 ENCOUNTER — Encounter (HOSPITAL_COMMUNITY): Payer: Self-pay | Admitting: Interventional Radiology

## 2017-09-05 ENCOUNTER — Other Ambulatory Visit: Payer: Self-pay | Admitting: Adult Health

## 2017-09-05 ENCOUNTER — Ambulatory Visit (HOSPITAL_COMMUNITY)
Admission: RE | Admit: 2017-09-05 | Discharge: 2017-09-05 | Disposition: A | Discharge status: Home / Self Care | Payer: Medicare Other | Source: Ambulatory Visit | Attending: Internal Medicine | Admitting: Internal Medicine

## 2017-09-05 ENCOUNTER — Ambulatory Visit (HOSPITAL_COMMUNITY)
Admission: RE | Admit: 2017-09-05 | Discharge: 2017-09-05 | Disposition: A | Discharge status: Home / Self Care | Payer: Medicare Other | Source: Ambulatory Visit | Attending: Adult Health | Admitting: Adult Health

## 2017-09-05 DIAGNOSIS — I10 Essential (primary) hypertension: Secondary | ICD-10-CM | POA: Insufficient documentation

## 2017-09-05 DIAGNOSIS — I739 Peripheral vascular disease, unspecified: Secondary | ICD-10-CM | POA: Insufficient documentation

## 2017-09-05 DIAGNOSIS — Z7982 Long term (current) use of aspirin: Secondary | ICD-10-CM | POA: Diagnosis not present

## 2017-09-05 DIAGNOSIS — Z5111 Encounter for antineoplastic chemotherapy: Secondary | ICD-10-CM | POA: Diagnosis not present

## 2017-09-05 DIAGNOSIS — F17210 Nicotine dependence, cigarettes, uncomplicated: Secondary | ICD-10-CM | POA: Insufficient documentation

## 2017-09-05 DIAGNOSIS — E785 Hyperlipidemia, unspecified: Secondary | ICD-10-CM | POA: Diagnosis not present

## 2017-09-05 DIAGNOSIS — N400 Enlarged prostate without lower urinary tract symptoms: Secondary | ICD-10-CM | POA: Diagnosis not present

## 2017-09-05 DIAGNOSIS — Z951 Presence of aortocoronary bypass graft: Secondary | ICD-10-CM | POA: Insufficient documentation

## 2017-09-05 DIAGNOSIS — C3492 Malignant neoplasm of unspecified part of left bronchus or lung: Secondary | ICD-10-CM

## 2017-09-05 DIAGNOSIS — I2510 Atherosclerotic heart disease of native coronary artery without angina pectoris: Secondary | ICD-10-CM | POA: Insufficient documentation

## 2017-09-05 DIAGNOSIS — J449 Chronic obstructive pulmonary disease, unspecified: Secondary | ICD-10-CM | POA: Insufficient documentation

## 2017-09-05 HISTORY — PX: IR IMAGING GUIDED PORT INSERTION: IMG5740

## 2017-09-05 LAB — PROTIME-INR
INR: 0.93
PROTHROMBIN TIME: 12.4 s (ref 11.4–15.2)

## 2017-09-05 LAB — CBC
HCT: 37.3 % — ABNORMAL LOW (ref 39.0–52.0)
Hemoglobin: 11.6 g/dL — ABNORMAL LOW (ref 13.0–17.0)
MCH: 29.5 pg (ref 26.0–34.0)
MCHC: 31.1 g/dL (ref 30.0–36.0)
MCV: 94.9 fL (ref 78.0–100.0)
PLATELETS: 227 10*3/uL (ref 150–400)
RBC: 3.93 MIL/uL — ABNORMAL LOW (ref 4.22–5.81)
RDW: 17.7 % — AB (ref 11.5–15.5)
WBC: 8 10*3/uL (ref 4.0–10.5)

## 2017-09-05 LAB — APTT: aPTT: 31 seconds (ref 24–36)

## 2017-09-05 MED ORDER — CEFAZOLIN SODIUM-DEXTROSE 2-4 GM/100ML-% IV SOLN
2.0000 g | INTRAVENOUS | Status: AC
  Administered 2017-09-05: 2 g via INTRAVENOUS

## 2017-09-05 MED ORDER — FENTANYL CITRATE (PF) 100 MCG/2ML IJ SOLN
INTRAMUSCULAR | Status: AC
  Filled 2017-09-05: qty 2

## 2017-09-05 MED ORDER — SODIUM CHLORIDE 0.9 % IV SOLN: INTRAVENOUS | Status: DC

## 2017-09-05 MED ORDER — HEPARIN SOD (PORK) LOCK FLUSH 100 UNIT/ML IV SOLN
INTRAVENOUS | Status: AC
  Filled 2017-09-05: qty 5

## 2017-09-05 MED ORDER — LIDOCAINE HCL 1 % IJ SOLN
INTRAMUSCULAR | Status: AC
  Filled 2017-09-05: qty 20

## 2017-09-05 MED ORDER — HEPARIN SOD (PORK) LOCK FLUSH 100 UNIT/ML IV SOLN
INTRAVENOUS | Status: AC | PRN
  Administered 2017-09-05: 500 [IU] via INTRAVENOUS

## 2017-09-05 MED ORDER — MIDAZOLAM HCL 2 MG/2ML IJ SOLN
INTRAMUSCULAR | Status: AC
  Filled 2017-09-05: qty 4

## 2017-09-05 MED ORDER — LIDOCAINE-EPINEPHRINE (PF) 2 %-1:200000 IJ SOLN
INTRAMUSCULAR | Status: AC
  Filled 2017-09-05: qty 20

## 2017-09-05 MED ORDER — CEFAZOLIN SODIUM-DEXTROSE 2-4 GM/100ML-% IV SOLN
INTRAVENOUS | Status: AC
  Administered 2017-09-05: 2 g via INTRAVENOUS
  Filled 2017-09-05: qty 100

## 2017-09-05 MED ORDER — MIDAZOLAM HCL 2 MG/2ML IJ SOLN
INTRAMUSCULAR | Status: AC | PRN
  Administered 2017-09-05 (×2): 1 mg via INTRAVENOUS

## 2017-09-05 MED ORDER — FENTANYL CITRATE (PF) 100 MCG/2ML IJ SOLN
INTRAMUSCULAR | Status: AC | PRN
  Administered 2017-09-05 (×2): 50 ug via INTRAVENOUS

## 2017-09-05 MED ORDER — LIDOCAINE-EPINEPHRINE (PF) 2 %-1:200000 IJ SOLN
INTRAMUSCULAR | Status: AC | PRN
  Administered 2017-09-05: 20 mL

## 2017-09-05 NOTE — Consult Note (Signed)
Chief Complaint: Patient was seen in consultation today for Port-A-Cath placement  Referring Physician(s): Mohamed,M  Supervising Physician: Jacqulynn Cadet  Patient Status: [facility] - Out-pt  History of Present Illness: [name] is a 68 y.o. male smoker with history of stage IV squamous cell carcinoma of the left lung diagnosed initially in 2018, status post chemoradiation and immunotherapy.  Recent imaging has revealed increase in size of the left suprahilar mass and he presents today for Port-A-Cath placement for additional treatment.  Past Medical History:  Diagnosis Date  . AAA (abdominal aortic aneurysm) ([place])   . Abdominal aortic aneurysm ([place])    AAA And bilateral common iliac artery aneurysms  . BPH (benign prostatic hyperplasia)   . CAD (coronary artery disease)    Dr Ron Parker @ [place]   . Chronic mental illness 04/21/2011   Diagnosis unclear,  Family provides good care  . COPD (chronic obstructive pulmonary disease) ([place]) 04/21/2011   smoking  . Drug-induced skin rash 09/05/2016  . Ejection fraction    EF 35-40%, echo, May, 2010  . Hyperlipidemia   . Hyperplastic colon polyp 04/21/2011  . Hypertension   . Preop cardiovascular exam    Cardiac clearance for major vascular surgery, May, 2013  . Pulmonary nodule    Chest CT May, 2013, 2 small pulmonary nodules, needs appropriate followup,  this CT was not ordered by the cardiology team  . PVD (peripheral vascular disease) ([place]) 04/21/2011  . Retention of urine   . Shortness of breath   . Squamous cell lung cancer, left ([place]) 06/13/2016  . Tobacco abuse     Past Surgical History:  Procedure Laterality Date  . ABDOMINAL AORTA STENT  07/2011  . ABDOMINAL AORTAGRAM N/A 07/26/2011   Procedure: ABDOMINAL Maxcine Ham;  Surgeon: Conrad Rexford, MD;  Location: [facility] CATH LAB;  Service: Cardiovascular;  Laterality: N/A;  . ABDOMINAL AORTIC ANEURYSM REPAIR  07/2011  . CORONARY ARTERY BYPASS GRAFT  07/2008   CABG X5/notes 07/12/2010  .  EMBOLIZATION Left 07/26/2011   Procedure: EMBOLIZATION;  Surgeon: Conrad Cuthbert, MD;  Location: [facility] CATH LAB;  Service: Cardiovascular;  Laterality: Left;  . TRANSURETHRAL RESECTION OF PROSTATE  11/29/2011   Procedure: TRANSURETHRAL RESECTION OF THE PROSTATE WITH GYRUS INSTRUMENTS;  Surgeon: Malka So, MD;  Location: [facility] ORS;  Service: Urology;  Laterality: N/A;  Prostate Ultrasound, and Biopsy  . VIDEO BRONCHOSCOPY Bilateral 04/12/2016   Procedure: VIDEO BRONCHOSCOPY WITH FLUORO;  Surgeon: Juanito Doom, MD;  Location: [place];  Service: Cardiopulmonary;  Laterality: Bilateral;    Allergies: Ace inhibitors  Medications: Prior to Admission medications   Medication Sig Start Date End Date Taking? Authorizing Provider  aspirin EC 81 MG tablet Take 81 mg by mouth daily.    [provider]  dexamethasone (DECADRON) 4 MG tablet Take 2 tabs twice a day the day before, day of, and day after each cycle of chemo 07/23/17   Maryanna Shape, NP  ondansetron (ZOFRAN ODT) 4 MG disintegrating tablet Take 1 tablet (4 mg total) by mouth every 8 (eight) hours as needed for nausea or vomiting. 03/17/17   Fawze, Mina A, PA-C  potassium chloride (K-DUR) 10 MEQ tablet Take 1 tablet (10 mEq total) by mouth daily. 08/21/17   Gardenia Phlegm, NP  prochlorperazine (COMPAZINE) 10 MG tablet Take 1 tablet (10 mg total) by mouth every 6 (six) hours as needed for nausea or vomiting. 06/26/16   Curt Bears, MD  psyllium (METAMUCIL) 58.6 % packet  Take 1 packet by mouth daily. 03/17/17   Fawze, Mina A, PA-C  rosuvastatin (CRESTOR) 40 MG tablet TAKE 1 TABLET BY MOUTH EVERY DAY 07/03/16   [name], MD  Sennosides (SENOKOT PO) Take 1 tablet by mouth daily as needed (CONSTIPATION).    [provider]  sucralfate (CARAFATE) 1 GM/10ML suspension Take 10 mLs (1 g total) by mouth 4 (four) times daily -  with meals and at bedtime. 07/02/16   Maryanna Shape, NP     Family History  Problem  Relation Age of Onset  . Cancer Father        prostate  . Heart disease Father   . Diabetes Father   . Heart attack Father 22  . Arthritis Sister   . Hyperlipidemia Sister   . Hypertension Sister   . Anesthesia problems Neg Hx     Social History   Socioeconomic History  . Marital status: Single    Spouse name: Not on file  . Number of children: Not on file  . Years of education: 90  . Highest education level: Not on file  Occupational History  . Occupation: retired  Scientific laboratory technician  . Financial resource strain: Not on file  . Food insecurity:    Worry: Not on file    Inability: Not on file  . Transportation needs:    Medical: Not on file    Non-medical: Not on file  Tobacco Use  . Smoking status: Current Every Day Smoker    Packs/day: 1.00    Years: 50.00    Pack years: 50.00    Types: Cigarettes  . Smokeless tobacco: Never Used  Substance and Sexual Activity  . Alcohol use: No  . Drug use: No  . Sexual activity: Never  Lifestyle  . Physical activity:    Days per week: Not on file    Minutes per session: Not on file  . Stress: Not on file  Relationships  . Social connections:    Talks on phone: Not on file    Gets together: Not on file    Attends religious service: Not on file    Active member of club or organization: Not on file    Attends meetings of clubs or organizations: Not on file    Relationship status: Not on file  Other Topics Concern  . Not on file  Social History Narrative  . Not on file      Review of Systems denies fever, headache, chest pain, abdominal/back pain, nausea, vomiting or bleeding.  He does have some dyspnea with exertion and occasional cough.  Vital Signs: BP (!) 123/105 (BP Location: Right Arm)   Pulse 77   Temp 97.8 F (36.6 C) (Oral)   Resp 18   SpO2 98%   Physical Exam awake, alert.  Chest with distant breath sounds bilaterally.  Heart with regular rate and rhythm.  Abdomen soft, positive bowel sounds, nontender.  No  lower extremity edema.  Imaging: No results found.  Labs:  CBC: Recent Labs    08/21/17 0926 08/28/17 1028 09/04/17 1018 09/05/17 1312  WBC 8.6 10.4* 10.7* 8.0  HGB 11.0* 10.9* 11.6* 11.6*  HCT 34.2* 34.5* 36.0* 37.3*  PLT 383 241 254 227    COAGS: No results for input(s): INR, APTT in the last 8760 hours.  BMP: Recent Labs    08/14/17 0955 08/21/17 0926 08/28/17 1028 09/04/17 1018  NA 138 140 139 140  K 3.8 3.2* 3.4* 4.1  CL 104  103 105 105  CO2 23 26 27 26   GLUCOSE 91 114 104 97  BUN 17 23 17 14   CALCIUM 9.3 9.8 9.5 9.3  CREATININE 1.00 1.09 1.11 0.98  GFRNONAA >60 >60 >60 >60  GFRAA >60 >60 >60 >60    LIVER FUNCTION TESTS: Recent Labs    08/14/17 0955 08/21/17 0926 08/28/17 1028 09/04/17 1018  BILITOT <0.2* 0.2 <0.2* 0.3  AST 22 16 18 19   ALT 10 9 10 12   ALKPHOS 158* 107 110 120  PROT 7.1 7.6 7.0 7.1  ALBUMIN 3.2* 3.2* 3.2* 3.1*    TUMOR MARKERS: No results for input(s): AFPTM, CEA, CA199, CHROMGRNA in the last 8760 hours.  Assessment and Plan: 66 y.o. male smoker with history of stage IV squamous cell carcinoma of the left lung diagnosed initially in 2018, status post chemoradiation and immunotherapy.  Recent imaging has revealed increase in size of the left suprahilar mass and he presents today for Port-A-Cath placement for additional treatment.Risks and benefits of image guided port-a-catheter placement was discussed with the patient including, but not limited to bleeding, infection, pneumothorax, or fibrin sheath development and need for additional procedures.  All of the patient's questions were answered, patient is agreeable to proceed. Consent signed and in chart.     Thank you for this interesting consult.  I greatly enjoyed meeting [name] and look forward to participating in their care.  A copy of this report was sent to the requesting provider on this date.  Electronically Signed: D. Rowe Robert, PA-C 09/05/2017, 1:32  PM   I spent a total of 25 minutes  in face to face in clinical consultation, greater than 50% of which was counseling/coordinating care for Port-A-Cath placement

## 2017-09-05 NOTE — Procedures (Signed)
Interventional Radiology Procedure Note  Procedure: Placement of a right IJ approach single lumen PowerPort.  Tip is positioned at the superior cavoatrial junction and catheter is ready for immediate use.  Complications: No immediate Recommendations:  - Ok to shower tomorrow - Do not submerge for 7 days - Routine line care   Signed,  Elizeo Rodriques K. Zaeda Mcferran, MD   

## 2017-09-05 NOTE — Discharge Instructions (Signed)
Implanted Port Home Guide °An implanted port is a type of central line that is placed under the skin. Central lines are used to provide IV access when treatment or nutrition needs to be given through a person’s veins. Implanted ports are used for long-term IV access. An implanted port may be placed because: °· You need IV medicine that would be irritating to the small veins in your hands or arms. °· You need long-term IV medicines, such as antibiotics. °· You need IV nutrition for a long period. °· You need frequent blood draws for lab tests. °· You need dialysis. ° °Implanted ports are usually placed in the chest area, but they can also be placed in the upper arm, the abdomen, or the leg. An implanted port has two main parts: °· Reservoir. The reservoir is round and will appear as a small, raised area under your skin. The reservoir is the part where a needle is inserted to give medicines or draw blood. °· Catheter. The catheter is a thin, flexible tube that extends from the reservoir. The catheter is placed into a large vein. Medicine that is inserted into the reservoir goes into the catheter and then into the vein. ° °How will I care for my incision site? °Do not get the incision site wet. Bathe or shower as directed by your health care provider. °How is my port accessed? °Special steps must be taken to access the port: °· Before the port is accessed, a numbing cream can be placed on the skin. This helps numb the skin over the port site. °· Your health care provider uses a sterile technique to access the port. °? Your health care provider must put on a mask and sterile gloves. °? The skin over your port is cleaned carefully with an antiseptic and allowed to dry. °? The port is gently pinched between sterile gloves, and a needle is inserted into the port. °· Only "non-coring" port needles should be used to access the port. Once the port is accessed, a blood return should be checked. This helps ensure that the port  is in the vein and is not clogged. °· If your port needs to remain accessed for a constant infusion, a clear (transparent) bandage will be placed over the needle site. The bandage and needle will need to be changed every week, or as directed by your health care provider. °· Keep the bandage covering the needle clean and dry. Do not get it wet. Follow your health care provider’s instructions on how to take a shower or bath while the port is accessed. °· If your port does not need to stay accessed, no bandage is needed over the port. ° °What is flushing? °Flushing helps keep the port from getting clogged. Follow your health care provider’s instructions on how and when to flush the port. Ports are usually flushed with saline solution or a medicine called heparin. The need for flushing will depend on how the port is used. °· If the port is used for intermittent medicines or blood draws, the port will need to be flushed: °? After medicines have been given. °? After blood has been drawn. °? As part of routine maintenance. °· If a constant infusion is running, the port may not need to be flushed. ° °How long will my port stay implanted? °The port can stay in for as long as your health care provider thinks it is needed. When it is time for the port to come out, surgery will be   done to remove it. The procedure is similar to the one performed when the port was put in. When should I seek immediate medical care? When you have an implanted port, you should seek immediate medical care if:  You notice a bad smell coming from the incision site.  You have swelling, redness, or drainage at the incision site.  You have more swelling or pain at the port site or the surrounding area.  You have a fever that is not controlled with medicine.  This information is not intended to replace advice given to you by your health care provider. Make sure you discuss any questions you have with your health care provider. Document  Released: 02/26/2005 Document Revised: 08/04/2015 Document Reviewed: 11/03/2012 Elsevier Interactive Patient Education  2017 [place]. Moderate Conscious Sedation, Adult, Care After These instructions provide you with information about caring for yourself after your procedure. Your health care provider may also give you more specific instructions. Your treatment has been planned according to current medical practices, but problems sometimes occur. Call your health care provider if you have any problems or questions after your procedure. What can I expect after the procedure? After your procedure, it is common:  To feel sleepy for several hours.  To feel clumsy and have poor balance for several hours.  To have poor judgment for several hours.  To vomit if you eat too soon.  Follow these instructions at home: For at least 24 hours after the procedure:   Do not: ? Participate in activities where you could fall or become injured. ? Drive. ? Use heavy machinery. ? Drink alcohol. ? Take sleeping pills or medicines that cause drowsiness. ? Make important decisions or sign legal documents. ? Take care of children on your own.  Rest. Eating and drinking  Follow the diet recommended by your health care provider.  If you vomit: ? Drink water, juice, or soup when you can drink without vomiting. ? Make sure you have little or no nausea before eating solid foods. General instructions  Have a responsible adult stay with you until you are awake and alert.  Take over-the-counter and prescription medicines only as told by your health care provider.  If you smoke, do not smoke without supervision.  Keep all follow-up visits as told by your health care provider. This is important. Contact a health care provider if:  You keep feeling nauseous or you keep vomiting.  You feel light-headed.  You develop a rash.  You have a fever. Get help right away if:  You have trouble  breathing. This information is not intended to replace advice given to you by your health care provider. Make sure you discuss any questions you have with your health care provider. Document Released: 12/17/2012 Document Revised: 08/01/2015 Document Reviewed: 06/18/2015 Elsevier Interactive Patient Education  [employer].

## 2017-09-11 ENCOUNTER — Encounter: Payer: Self-pay | Admitting: Nurse Practitioner

## 2017-09-11 ENCOUNTER — Inpatient Hospital Stay: Payer: Medicare Other | Attending: Internal Medicine

## 2017-09-11 ENCOUNTER — Inpatient Hospital Stay: Payer: Medicare Other

## 2017-09-11 ENCOUNTER — Inpatient Hospital Stay (HOSPITAL_BASED_OUTPATIENT_CLINIC_OR_DEPARTMENT_OTHER): Payer: Medicare Other | Admitting: Nurse Practitioner

## 2017-09-11 ENCOUNTER — Inpatient Hospital Stay: Payer: Medicare Other | Admitting: Nutrition

## 2017-09-11 VITALS — BP 107/78 | HR 82 | Temp 98.7°F | Resp 18 | Ht 75.0 in | Wt 119.1 lb

## 2017-09-11 DIAGNOSIS — Z79899 Other long term (current) drug therapy: Secondary | ICD-10-CM

## 2017-09-11 DIAGNOSIS — Z7952 Long term (current) use of systemic steroids: Secondary | ICD-10-CM | POA: Diagnosis not present

## 2017-09-11 DIAGNOSIS — C3492 Malignant neoplasm of unspecified part of left bronchus or lung: Secondary | ICD-10-CM

## 2017-09-11 DIAGNOSIS — F17210 Nicotine dependence, cigarettes, uncomplicated: Secondary | ICD-10-CM | POA: Insufficient documentation

## 2017-09-11 DIAGNOSIS — Z5111 Encounter for antineoplastic chemotherapy: Secondary | ICD-10-CM | POA: Insufficient documentation

## 2017-09-11 DIAGNOSIS — C3412 Malignant neoplasm of upper lobe, left bronchus or lung: Secondary | ICD-10-CM | POA: Diagnosis not present

## 2017-09-11 DIAGNOSIS — R200 Anesthesia of skin: Secondary | ICD-10-CM | POA: Insufficient documentation

## 2017-09-11 DIAGNOSIS — J449 Chronic obstructive pulmonary disease, unspecified: Secondary | ICD-10-CM | POA: Insufficient documentation

## 2017-09-11 DIAGNOSIS — Z5112 Encounter for antineoplastic immunotherapy: Secondary | ICD-10-CM | POA: Diagnosis not present

## 2017-09-11 DIAGNOSIS — R0609 Other forms of dyspnea: Secondary | ICD-10-CM | POA: Insufficient documentation

## 2017-09-11 DIAGNOSIS — I10 Essential (primary) hypertension: Secondary | ICD-10-CM | POA: Insufficient documentation

## 2017-09-11 DIAGNOSIS — Z923 Personal history of irradiation: Secondary | ICD-10-CM | POA: Insufficient documentation

## 2017-09-11 DIAGNOSIS — E785 Hyperlipidemia, unspecified: Secondary | ICD-10-CM | POA: Insufficient documentation

## 2017-09-11 LAB — CBC WITH DIFFERENTIAL (CANCER CENTER ONLY)
BASOS PCT: 0 %
Basophils Absolute: 0 10*3/uL (ref 0.0–0.1)
EOS PCT: 0 %
Eosinophils Absolute: 0 10*3/uL (ref 0.0–0.5)
HCT: 36 % — ABNORMAL LOW (ref 38.4–49.9)
Hemoglobin: 11.4 g/dL — ABNORMAL LOW (ref 13.0–17.1)
Lymphocytes Relative: 6 %
Lymphs Abs: 0.6 10*3/uL — ABNORMAL LOW (ref 0.9–3.3)
MCH: 29.5 pg (ref 27.2–33.4)
MCHC: 31.7 g/dL — AB (ref 32.0–36.0)
MCV: 93 fL (ref 79.3–98.0)
MONO ABS: 0.6 10*3/uL (ref 0.1–0.9)
MONOS PCT: 6 %
Neutro Abs: 8.1 10*3/uL — ABNORMAL HIGH (ref 1.5–6.5)
Neutrophils Relative %: 88 %
Platelet Count: 263 10*3/uL (ref 140–400)
RBC: 3.87 MIL/uL — ABNORMAL LOW (ref 4.20–5.82)
RDW: 17.7 % — ABNORMAL HIGH (ref 11.0–14.6)
WBC Count: 9.3 10*3/uL (ref 4.0–10.3)

## 2017-09-11 LAB — CMP (CANCER CENTER ONLY)
ALBUMIN: 3.3 g/dL — AB (ref 3.5–5.0)
ALK PHOS: 107 U/L (ref 38–126)
ALT: 12 U/L (ref 0–44)
AST: 16 U/L (ref 15–41)
Anion gap: 9 (ref 5–15)
BILIRUBIN TOTAL: 0.3 mg/dL (ref 0.3–1.2)
BUN: 25 mg/dL — AB (ref 8–23)
CALCIUM: 9.9 mg/dL (ref 8.9–10.3)
CO2: 28 mmol/L (ref 22–32)
CREATININE: 1.2 mg/dL (ref 0.61–1.24)
Chloride: 103 mmol/L (ref 98–111)
GFR, Est AFR Am: >60 mL/min (ref >60)
GFR, Estimated: 60 mL/min — ABNORMAL LOW (ref >60)
GLUCOSE: 77 mg/dL (ref 70–99)
Potassium: 3.9 mmol/L (ref 3.5–5.1)
SODIUM: 140 mmol/L (ref 135–145)
TOTAL PROTEIN: 7.3 g/dL (ref 6.5–8.1)

## 2017-09-11 MED ORDER — SODIUM CHLORIDE 0.9 % IV SOLN
10.0000 mg/kg | Freq: Once | INTRAVENOUS | Status: AC
  Administered 2017-09-11: 600 mg via INTRAVENOUS
  Filled 2017-09-11: qty 50

## 2017-09-11 MED ORDER — DEXAMETHASONE SODIUM PHOSPHATE 10 MG/ML IJ SOLN
10.0000 mg | Freq: Once | INTRAMUSCULAR | Status: AC
  Administered 2017-09-11: 10 mg via INTRAVENOUS

## 2017-09-11 MED ORDER — DOCETAXEL CHEMO INJECTION 160 MG/16ML
75.0000 mg/m2 | Freq: Once | INTRAVENOUS | Status: AC
  Administered 2017-09-11: 130 mg via INTRAVENOUS
  Filled 2017-09-11: qty 13

## 2017-09-11 MED ORDER — SODIUM CHLORIDE 0.9% FLUSH
10.0000 mL | INTRAVENOUS | Status: DC | PRN
  Administered 2017-09-11: 10 mL
  Filled 2017-09-11: qty 10

## 2017-09-11 MED ORDER — ACETAMINOPHEN 325 MG PO TABS
650.0000 mg | ORAL_TABLET | Freq: Once | ORAL | Status: AC
  Administered 2017-09-11: 650 mg via ORAL

## 2017-09-11 MED ORDER — SODIUM CHLORIDE 0.9 % IV SOLN
Freq: Once | INTRAVENOUS | Status: AC
  Administered 2017-09-11: 12:00:00 via INTRAVENOUS

## 2017-09-11 MED ORDER — HEPARIN SOD (PORK) LOCK FLUSH 100 UNIT/ML IV SOLN
500.0000 [IU] | Freq: Once | INTRAVENOUS | Status: AC | PRN
  Administered 2017-09-11: 500 [IU]
  Filled 2017-09-11: qty 5

## 2017-09-11 MED ORDER — DEXAMETHASONE SODIUM PHOSPHATE 10 MG/ML IJ SOLN
INTRAMUSCULAR | Status: AC
  Filled 2017-09-11: qty 1

## 2017-09-11 MED ORDER — PEGFILGRASTIM 6 MG/0.6ML ~~LOC~~ PSKT
6.0000 mg | PREFILLED_SYRINGE | Freq: Once | SUBCUTANEOUS | Status: AC
  Administered 2017-09-11: 6 mg via SUBCUTANEOUS

## 2017-09-11 MED ORDER — ACETAMINOPHEN 325 MG PO TABS
ORAL_TABLET | ORAL | Status: AC
  Filled 2017-09-11: qty 2

## 2017-09-11 MED ORDER — DIPHENHYDRAMINE HCL 50 MG/ML IJ SOLN
INTRAMUSCULAR | Status: AC
  Filled 2017-09-11: qty 1

## 2017-09-11 MED ORDER — DIPHENHYDRAMINE HCL 50 MG/ML IJ SOLN
50.0000 mg | Freq: Once | INTRAMUSCULAR | Status: AC
  Administered 2017-09-11: 50 mg via INTRAVENOUS

## 2017-09-11 NOTE — Progress Notes (Signed)
  [facility] OFFICE PROGRESS NOTE   DIAGNOSIS: Stage IIIa (T2b, N2, M0) non-small cell lung cancer, squamous cell carcinoma presented with left upper lobe lung mass in addition to AP window lymphadenopathy diagnosed in February 2018.  PRIOR THERAPY: 1) Concurrent chemoradiation therapy. He received radiation for approximately 2-3 weeks prior to starting his chemotherapy. He completed his radiation on 07/05/2016. He received 2 doses of weekly carboplatin for an AUC of 2 and paclitaxel 45 mg meter squared which was last given on 07/03/2016. 2) consolidation treatment with immunotherapy with Imfinzi (Durvalumab) 10 MG/KG every 2 weeks. First dose 08/22/2016. Status post 2 cycles. Last dose was given September 19, 2016 discontinued secondary to intolerance and significant skin rash. 3) Systemic chemotherapy with carboplatin for AUC of 5 and paclitaxel 175 mg/M2 every 3 weeks. First dose February 06, 2017.Status post6cycles.  CURRENT THERAPY: Docetaxel 75 mg/m with Cyramza 10 mg/kg given every 3 weeks.  First dose 08/02/2017.     INTERVAL HISTORY:   Mr. Devonshire returns as scheduled.  He completed cycle 2 Taxotere/cyramza 08/21/2017.  He had a Port-A-Cath placed 09/05/2017.  He denies nausea/vomiting.  No mouth sores.  No diarrhea.  No rash.  He has stable numbness in his toes which predated the current chemotherapy.  He denies tearing.  No pain.  He has occasional dyspnea.  He has a good appetite.  Objective:  Vital signs in last 24 hours:  Blood pressure 107/78, pulse 82, temperature 98.7 F (37.1 C), temperature source Oral, resp. rate 18, height 6\' 3"  (1.905 m), weight 119 lb 1.6 oz (54 kg), SpO2 99 %.    HEENT: No thrush or ulcers. Resp: Distant breath sounds.  No respiratory distress. Cardio: Regular rate and rhythm. GI: Abdomen soft and nontender.  No hepatomegaly. Vascular: No leg edema. Neuro: Alert, oriented.  Follows commands. Skin: No rash.  Warm and  dry.   Lab Results:  Lab Results  Component Value Date   WBC 9.3 09/11/2017   HGB 11.4 (L) 09/11/2017   HCT 36.0 (L) 09/11/2017   MCV 93.0 09/11/2017   PLT 263 09/11/2017   NEUTROABS 8.1 (H) 09/11/2017    Imaging:  No results found.  Medications: I have reviewed the patient's current medications.  Assessment/Plan: 1. Stage IV non-small cell lung cancer, squamous cell carcinoma.  This was initially diagnosed stage IIIa non-small cell lung cancer, squamous cell carcinoma.  He underwent a course of concurrent chemoradiation with weekly carboplatin and Taxol followed by immunotherapy which was discontinued secondary to significant skin rash and itching.  Treated with carboplatin and Taxol for 6 cycles 02/28/2017 through 06/12/2017, discontinued secondary to disease progression.  Currently on active treatment with Taxotere/Cyramza.  He has completed 2 cycles. 2. Port-A-Cath placement 09/05/2017  Disposition: Mr. Justen appears stable.  He has completed 2 cycles of Taxotere/cyramza.  Overall he seems to be tolerating well.  Plan to proceed with cycle 3 today as scheduled.  Dr. Julien Nordmann recommends a restaging CT scan of the chest after this cycle.  We will try to get this scheduled for 09/30/2017.  He will return for lab and follow-up, possible treatment on 10/02/2017.  He will contact the office in the interim with any problems.  Plan reviewed with Dr. Julien Nordmann.    Ned Card ANP/GNP-BC   09/11/2017  10:53 AM

## 2017-09-11 NOTE — Progress Notes (Signed)
67 year old male diagnosed with lung cancer. He is a patient of Dr. Julien Nordmann.  PMH includes AAA, CAD, COPD, HLD, HTN, and Tobacco.  Medications include Metamucil, Crestor, Zofran and Compazine.  Labs include Albumin of 3.3.  Height: 6'3" Weight: 119.1 pounds on July 3. UBW: 134 pounds in January. BMI: 14.89. (underweight)  Patient denies nutrition impact symptoms. He is familiar from past visits. He refuses offer of samples or snacks.  Nutrition Diagnosis: Severe malnutrition in the context of chronic illness secondary to weight loss of 11% over 6 months, and severe depletion of muscle and fat stores.  Intervention: Educated patient to try to increase food and meals and snacks.  Recommended oral nutrition supplements TID. Provided coupons. Questions answered and teach back method used.  Monitoring, Evaluation, Goals: Patient will increase oral intake to minimize weight loss.  No follow up. Patient is not open to changes at this time. He has my contact information.

## 2017-09-11 NOTE — Patient Instructions (Signed)
[facility] Discharge Instructions for Patients Receiving Chemotherapy  Today you received the following chemotherapy agents: Cyramza, Taxotere  To help prevent nausea and vomiting after your treatment, we encourage you to take your nausea medication as directed.    If you develop nausea and vomiting that is not controlled by your nausea medication, call the clinic.   BELOW ARE SYMPTOMS THAT SHOULD BE REPORTED IMMEDIATELY:  *FEVER GREATER THAN 100.5 F  *CHILLS WITH OR WITHOUT FEVER  NAUSEA AND VOMITING THAT IS NOT CONTROLLED WITH YOUR NAUSEA MEDICATION  *UNUSUAL SHORTNESS OF BREATH  *UNUSUAL BRUISING OR BLEEDING  TENDERNESS IN MOUTH AND THROAT WITH OR WITHOUT PRESENCE OF ULCERS  *URINARY PROBLEMS  *BOWEL PROBLEMS  UNUSUAL RASH Items with * indicate a potential emergency and should be followed up as soon as possible.  Feel free to call the clinic should you have any questions or concerns. The clinic phone number is [phone].  Please show the CHEMO ALERT CARD at check-in to the Emergency Department and triage nurse.   

## 2017-09-11 NOTE — Progress Notes (Signed)
MD gave ok to proceed w/ Cyramza today s/p port placement 09/05/17. Kennith Center, Pharm.D., CPP 09/11/2017@12 :12 PM

## 2017-09-18 ENCOUNTER — Encounter (HOSPITAL_COMMUNITY): Payer: Self-pay

## 2017-09-18 ENCOUNTER — Other Ambulatory Visit: Payer: Medicare Other

## 2017-09-18 ENCOUNTER — Ambulatory Visit: Payer: Medicare Other | Admitting: Oncology

## 2017-09-18 ENCOUNTER — Inpatient Hospital Stay: Payer: Medicare Other

## 2017-09-18 ENCOUNTER — Ambulatory Visit: Payer: Medicare Other

## 2017-09-18 ENCOUNTER — Other Ambulatory Visit: Payer: Self-pay

## 2017-09-18 ENCOUNTER — Emergency Department (HOSPITAL_COMMUNITY): Payer: Medicare Other

## 2017-09-18 ENCOUNTER — Emergency Department (HOSPITAL_COMMUNITY)
Admission: EM | Admit: 2017-09-18 | Discharge: 2017-09-18 | Disposition: A | Discharge status: Home / Self Care | Payer: Medicare Other | Attending: Emergency Medicine | Admitting: Emergency Medicine

## 2017-09-18 DIAGNOSIS — I2510 Atherosclerotic heart disease of native coronary artery without angina pectoris: Secondary | ICD-10-CM | POA: Diagnosis not present

## 2017-09-18 DIAGNOSIS — Z79899 Other long term (current) drug therapy: Secondary | ICD-10-CM | POA: Diagnosis not present

## 2017-09-18 DIAGNOSIS — J441 Chronic obstructive pulmonary disease with (acute) exacerbation: Secondary | ICD-10-CM | POA: Insufficient documentation

## 2017-09-18 DIAGNOSIS — I10 Essential (primary) hypertension: Secondary | ICD-10-CM | POA: Diagnosis not present

## 2017-09-18 DIAGNOSIS — R0602 Shortness of breath: Secondary | ICD-10-CM | POA: Diagnosis not present

## 2017-09-18 DIAGNOSIS — C3492 Malignant neoplasm of unspecified part of left bronchus or lung: Secondary | ICD-10-CM | POA: Insufficient documentation

## 2017-09-18 DIAGNOSIS — R0600 Dyspnea, unspecified: Secondary | ICD-10-CM

## 2017-09-18 DIAGNOSIS — Z951 Presence of aortocoronary bypass graft: Secondary | ICD-10-CM | POA: Insufficient documentation

## 2017-09-18 DIAGNOSIS — F17210 Nicotine dependence, cigarettes, uncomplicated: Secondary | ICD-10-CM | POA: Diagnosis not present

## 2017-09-18 LAB — BASIC METABOLIC PANEL
ANION GAP: 11 (ref 5–15)
BUN: 19 mg/dL (ref 8–23)
CALCIUM: 9.1 mg/dL (ref 8.9–10.3)
CO2: 25 mmol/L (ref 22–32)
Chloride: 100 mmol/L (ref 98–111)
Creatinine, Ser: 1.07 mg/dL (ref 0.61–1.24)
GFR calc Af Amer: >60 mL/min (ref >60)
GFR calc non Af Amer: >60 mL/min (ref >60)
GLUCOSE: 96 mg/dL (ref 70–99)
POTASSIUM: 4.2 mmol/L (ref 3.5–5.1)
SODIUM: 136 mmol/L (ref 135–145)

## 2017-09-18 LAB — CBC WITH DIFFERENTIAL/PLATELET
BASOS ABS: 0 10*3/uL (ref 0.0–0.1)
Basophils Relative: 1 %
EOS PCT: 1 %
Eosinophils Absolute: 0 10*3/uL (ref 0.0–0.7)
HCT: 37.2 % — ABNORMAL LOW (ref 39.0–52.0)
Hemoglobin: 11.7 g/dL — ABNORMAL LOW (ref 13.0–17.0)
LYMPHS PCT: 11 %
Lymphs Abs: 0.4 10*3/uL — ABNORMAL LOW (ref 0.7–4.0)
MCH: 29.3 pg (ref 26.0–34.0)
MCHC: 31.5 g/dL (ref 30.0–36.0)
MCV: 93 fL (ref 78.0–100.0)
MONO ABS: 0.4 10*3/uL (ref 0.1–1.0)
Monocytes Relative: 10 %
Neutro Abs: 3 10*3/uL (ref 1.7–7.7)
Neutrophils Relative %: 77 %
PLATELETS: 254 10*3/uL (ref 150–400)
RBC: 4 MIL/uL — AB (ref 4.22–5.81)
RDW: 17.8 % — AB (ref 11.5–15.5)
WBC: 3.9 10*3/uL — AB (ref 4.0–10.5)

## 2017-09-18 LAB — I-STAT TROPONIN, ED: TROPONIN I, POC: 0.02 ng/mL (ref 0.00–0.08)

## 2017-09-18 LAB — BRAIN NATRIURETIC PEPTIDE: B Natriuretic Peptide: 60 pg/mL (ref 0.0–100.0)

## 2017-09-18 MED ORDER — ALBUTEROL SULFATE HFA 108 (90 BASE) MCG/ACT IN AERS: 1.0000 | INHALATION_SPRAY | Freq: Four times a day (QID) | RESPIRATORY_TRACT | 0 refills | Status: AC | PRN

## 2017-09-18 MED ORDER — IPRATROPIUM-ALBUTEROL 0.5-2.5 (3) MG/3ML IN SOLN: 3.0000 mL | Freq: Once | RESPIRATORY_TRACT | Status: DC

## 2017-09-18 MED ORDER — ALBUTEROL (5 MG/ML) CONTINUOUS INHALATION SOLN
10.0000 mg/h | INHALATION_SOLUTION | Freq: Once | RESPIRATORY_TRACT | Status: AC
  Administered 2017-09-18: 10 mg/h via RESPIRATORY_TRACT
  Filled 2017-09-18: qty 20

## 2017-09-18 MED ORDER — PREDNISONE 10 MG PO TABS: 60.0000 mg | ORAL_TABLET | Freq: Every day | ORAL | 0 refills | Status: DC

## 2017-09-18 MED ORDER — PREDNISONE 20 MG PO TABS
60.0000 mg | ORAL_TABLET | Freq: Once | ORAL | Status: AC
  Administered 2017-09-18: 60 mg via ORAL
  Filled 2017-09-18: qty 3

## 2017-09-18 MED ORDER — ALBUTEROL SULFATE (2.5 MG/3ML) 0.083% IN NEBU
5.0000 mg | INHALATION_SOLUTION | Freq: Once | RESPIRATORY_TRACT | Status: DC
  Filled 2017-09-18: qty 6

## 2017-09-18 MED ORDER — IPRATROPIUM BROMIDE 0.02 % IN SOLN
RESPIRATORY_TRACT | Status: AC
  Administered 2017-09-18: 0.5 mg
  Filled 2017-09-18: qty 2.5

## 2017-09-18 NOTE — ED Notes (Signed)
Pt and family updated on plan of care  

## 2017-09-18 NOTE — Discharge Instructions (Addendum)
We suspect that his symptoms are because of COPD exacerbation. Please take the medications prescribed.  Return to the ER immediately if the shortness of breath gets worse.

## 2017-09-18 NOTE — ED Triage Notes (Signed)
Patient c/o increased SOB today. Patient has lung cancer and is currently receiving chemo treatments. Patient states he was scheduled for blood work today at the [facility]

## 2017-09-18 NOTE — ED Notes (Signed)
Respiratory notified.

## 2017-09-18 NOTE — ED Provider Notes (Addendum)
[facility] DEPT Provider Note   CSN: [id number] Arrival date & time: 09/18/17  2952     History   Chief Complaint Chief Complaint  Patient presents with  . Shortness of Breath  . chemo patient    HPI [name] is Coleman 67 y.o. male.  HPI  67 year old male comes in with chief complaint of shortness of breath.  Patient has history of advanced COPD and advanced squamous carcinoma of the lung.  In addition he also has CHF with EF of 30% and is an active smoker.  According to family, patient has been having progressively worsening shortness of breath over the past several weeks, however today he looks more winded than usual therefore they decided to bring him to the ER.  Patient denies any associated chest pain, back pain, blood loss.  He takes reading treatments, and gets intermittent relief.  Patient has Coleman cough without any Coleman and he has wheezing.  Patient continues to smoke 1 pack Coleman day.  Past Medical History:  Diagnosis Date  . AAA (abdominal aortic aneurysm) ([place])   . Abdominal aortic aneurysm ([place])    AAA And bilateral common iliac artery aneurysms  . BPH (benign prostatic hyperplasia)   . CAD (coronary artery disease)    Dr [name] @ [facility]   . Chronic mental illness 04/21/2011   Diagnosis unclear,  Family provides good care  . COPD (chronic obstructive pulmonary disease) ([place]) 04/21/2011   smoking  . Drug-induced skin rash 09/05/2016  . Ejection fraction    EF 35-40%, echo, May, 2010  . Hyperlipidemia   . Hyperplastic colon polyp 04/21/2011  . Hypertension   . Preop cardiovascular exam    Cardiac clearance for major vascular surgery, May, 2013  . Pulmonary nodule    Chest CT May, 2013, 2 small pulmonary nodules, needs appropriate followup,  this CT was not ordered by the cardiology team  . PVD (peripheral vascular disease) ([place]) 04/21/2011  . Retention of urine   . Shortness of breath   . Squamous cell lung cancer, left ([place]) 06/13/2016    . Tobacco abuse     Patient Active Problem List   Diagnosis Date Noted  . Aortic atherosclerosis ([place]) 08/21/2017  . Emphysema of lung ([place]) 08/21/2017  . Stage IV squamous cell carcinoma of left lung ([place]) 07/23/2017  . Encounter for antineoplastic immunotherapy 09/05/2016  . Drug-induced skin rash 09/05/2016  . Esophagitis 07/02/2016  . Squamous cell lung cancer, left ([place]) 06/13/2016  . Goals of care, counseling/discussion 06/13/2016  . Lung cancer ([place]) 04/13/2016  . Syncope 03/28/2016  . Near syncope 03/28/2016  . Smoker 05/28/2014  . Abdominal aneurysm without mention of rupture 08/31/2011  . Aneurysm of iliac artery ([place]) 08/31/2011  . Preop cardiovascular exam   . Abdominal aortic aneurysm ([place])   . Lung mass   . Hypertension   . Hyperlipidemia   . CAD (coronary artery disease)   . Hx of CABG   . Ejection fraction   . Tobacco abuse   . Iliac artery aneurysm, bilateral ([place]) 06/22/2011  . Fatigue 04/23/2011  . PSA elevation 04/23/2011  . Encounter for antineoplastic chemotherapy 04/21/2011  . COPD (chronic obstructive pulmonary disease) ([place]) 04/21/2011  . Hyperplastic colon polyp 04/21/2011  . PVD (peripheral vascular disease) ([place]) 04/21/2011  . Gastritis and duodenitis 04/21/2011  . Chronic mental illness 04/21/2011  . CONSTIPATION 11/12/2008  . CORONARY ARTERY BYPASS GRAFT, HX OF 11/09/2008  . CARDIOMYOPATHY, ISCHEMIC 08/23/2008  . HEMOCCULT  POSITIVE STOOL 08/23/2008    Past Surgical History:  Procedure Laterality Date  . ABDOMINAL AORTA STENT  07/2011  . ABDOMINAL AORTAGRAM N/Coleman 07/26/2011   Procedure: ABDOMINAL [name];  Surgeon: Adrian Centereach, MD;  Location: [facility] CATH LAB;  Service: Cardiovascular;  Laterality: N/Coleman;  . ABDOMINAL AORTIC ANEURYSM REPAIR  07/2011  . CORONARY ARTERY BYPASS GRAFT  07/2008   CABG X5/notes 07/12/2010  . EMBOLIZATION Left 07/26/2011   Procedure: EMBOLIZATION;  Surgeon: Adrian Andrews, MD;  Location: [facility] CATH LAB;  Service:  Cardiovascular;  Laterality: Left;  . IR IMAGING GUIDED PORT INSERTION  09/05/2017  . TRANSURETHRAL RESECTION OF PROSTATE  11/29/2011   Procedure: TRANSURETHRAL RESECTION OF THE PROSTATE WITH GYRUS INSTRUMENTS;  Surgeon: Adrian So, MD;  Location: [facility] ORS;  Service: Urology;  Laterality: N/Coleman;  Prostate Ultrasound, and Biopsy  . VIDEO BRONCHOSCOPY Bilateral 04/12/2016   Procedure: VIDEO BRONCHOSCOPY WITH FLUORO;  Surgeon: Adrian Doom, MD;  Location: [facility];  Service: Cardiopulmonary;  Laterality: Bilateral;        Home Medications    Prior to Admission medications   Medication Sig Start Date End Date Taking? Authorizing Provider  dexamethasone (DECADRON) 4 MG tablet Take 2 tabs twice Coleman day the day before, day of, and day after each cycle of chemo 07/23/17  Yes [name], Adrian Awkward, NP  rosuvastatin (CRESTOR) 40 MG tablet TAKE 1 TABLET BY MOUTH EVERY DAY 07/03/16  Yes Adrian Borg, MD  albuterol (PROVENTIL HFA;VENTOLIN HFA) 108 (90 Base) MCG/ACT inhaler Inhale 1-2 puffs into the lungs every 6 (six) hours as needed for wheezing or shortness of breath. 09/18/17   Adrian Biles, MD  ondansetron (ZOFRAN ODT) 4 MG disintegrating tablet Take 1 tablet (4 mg total) by mouth every 8 (eight) hours as needed for nausea or vomiting. Patient not taking: Reported on 09/18/2017 03/17/17   Adrian Perna Coleman, [name]  potassium chloride (K-DUR) 10 MEQ tablet Take 1 tablet (10 mEq total) by mouth daily. Patient not taking: Reported on 09/18/2017 08/21/17   Adrian Phlegm, NP  predniSONE (DELTASONE) 10 MG tablet Take 6 tablets (60 mg total) by mouth daily. 09/18/17   Adrian Biles, MD  prochlorperazine (COMPAZINE) 10 MG tablet Take 1 tablet (10 mg total) by mouth every 6 (six) hours as needed for nausea or vomiting. Patient not taking: Reported on 09/18/2017 06/26/16   Adrian Bears, MD  psyllium (METAMUCIL) 58.6 % packet Take 1 packet by mouth daily. 03/17/17   Fawze, Mina Coleman, [name]  sucralfate (CARAFATE) 1  GM/10ML suspension Take 10 mLs (1 g total) by mouth 4 (four) times daily -  with meals and at bedtime. Patient not taking: Reported on 09/18/2017 07/02/16   Adrian Shape, NP    Family History Family History  Problem Relation Age of Onset  . Cancer Father        prostate  . Heart disease Father   . Diabetes Father   . Heart attack Father 60  . Arthritis Sister   . Hyperlipidemia Sister   . Hypertension Sister   . Anesthesia problems Neg Hx     Social History Social History   Tobacco Use  . Smoking status: Current Every Day Smoker    Packs/day: 1.00    Years: 50.00    Pack years: 50.00    Types: Cigarettes  . Smokeless tobacco: Never Used  Substance Use Topics  . Alcohol use: No  . Drug use: No     Allergies  Ace inhibitors   Review of Systems Review of Systems  Constitutional: Positive for activity change.  Respiratory: Positive for shortness of breath and wheezing.   Cardiovascular: Negative for chest pain.  Gastrointestinal: Negative for abdominal pain.  Hematological: Does not bruise/bleed easily.     Physical Exam Updated Vital Signs BP 113/82   Pulse 98   Temp 97.7 F (36.5 C) (Oral)   Resp 18   Ht 6\' 3"  (1.905 m)   Wt 53.5 kg (118 lb)   SpO2 94%   BMI 14.75 kg/m   Physical Exam  Constitutional: He is oriented to person, place, and time. He appears well-developed.  HENT:  Head: Atraumatic.  Neck: Neck supple. JVD present.  Cardiovascular: Normal rate.  Pulmonary/Chest: Effort normal. No stridor. No tachypnea. No respiratory distress. He has no wheezes. He has no rhonchi. He has no rales.  Diminished breath sounds in all lung fields without any wheezing  Abdominal: Soft.  Musculoskeletal:       Right lower leg: He exhibits no tenderness and no edema.       Left lower leg: He exhibits no tenderness and no edema.  Neurological: He is alert and oriented to person, place, and time.  Skin: Skin is warm.  Nursing note and vitals  reviewed.    ED Treatments / Results  Labs (all labs ordered are listed, but only abnormal results are displayed) Labs Reviewed  CBC WITH DIFFERENTIAL/PLATELET - Abnormal; Notable for the following components:      Result Value   WBC 3.9 (*)    RBC 4.00 (*)    Hemoglobin 11.7 (*)    HCT 37.2 (*)    RDW 17.8 (*)    Lymphs Abs 0.4 (*)    All other components within normal limits  BASIC METABOLIC PANEL  BRAIN NATRIURETIC PEPTIDE  I-STAT TROPONIN, ED    EKG EKG Interpretation  Date/Time:  Wednesday September 18 2017 10:20:44 EDT Ventricular Rate:  96 PR Interval:    QRS Duration: 87 QT Interval:  343 QTC Calculation: 434 R Axis:   74 Text Interpretation:  Sinus rhythm Probable left atrial enlargement Borderline repolarization abnormality ST depression in the lateral leads Confirmed by [name] ([id number]) on 09/18/2017 1:56:15 PM   Radiology Dg Chest 2 View  Result Date: 09/18/2017 CLINICAL DATA:  Shortness of breath and weakness today. History of left lung malignancy on chemotherapy. Six months of weight loss. EXAM: CHEST - 2 VIEW COMPARISON:  CT scan of the chest dated Jul 16, 2017 FINDINGS: The lungs remain hyperinflated with hemidiaphragm flattening. There is persistent soft tissue fullness in the medial aspect of the left upper lobe. This is slightly less conspicuous than on the previous chest CT scan. There is no pleural effusion, alveolar infiltrate, or pneumothorax. The heart and pulmonary vascularity are normal. The patient has undergone previous CABG. There is calcification in the wall of the aortic arch. The power port catheter tip projects over the junction of the middle and distal thirds of the SVC. There is no acute bony abnormality. IMPRESSION: Marked hyperinflation consistent with COPD. Persistent abnormal soft tissue mass in the left apex slightly less prominent today. No acute pneumonia nor CHF. Thoracic aortic atherosclerosis. Electronically Signed   By: David  Martinique  M.D.   On: 09/18/2017 10:30    Procedures Procedures (including critical care time)  Medications Ordered in ED Medications  albuterol (PROVENTIL) (2.5 MG/3ML) 0.083% nebulizer solution 5 mg (0 mg Nebulization Hold 09/18/17 1044)  ipratropium-albuterol (DUONEB) 0.5-2.5 (  3) MG/3ML nebulizer solution 3 mL (3 mLs Nebulization Not Given 09/18/17 1248)  albuterol (PROVENTIL,VENTOLIN) solution continuous neb (10 mg/hr Nebulization Given 09/18/17 1248)  ipratropium (ATROVENT) 0.02 % nebulizer solution (0.5 mg  Given 09/18/17 1248)  predniSONE (DELTASONE) tablet 60 mg (60 mg Oral Given 09/18/17 1512)     Initial Impression / Assessment and Plan / ED Course  I have reviewed the triage vital signs and the nursing notes.  Pertinent labs & imaging results that were available during my care of the patient were reviewed by me and considered in my medical decision making (see chart for details).  Clinical Course as of Sep 18 1532  Wed Sep 18, 2017  1509 On repeat lung exam, patient's aeration has improved.  He continues to be breathing comfortably without any hypoxia.  Patient is supposed to get Coleman CT chest again in few days.  I discussed with the patient and the family members my concern that this could be COPD exacerbation.  I have informed him that we did not screen for PE, and therefore if the symptoms of shortness of breath were to get worse despite medications for COPD exacerbation, they will return to the ER.   [AN]    Clinical Course User Index [AN] Adrian Biles, MD    Patient comes in with chief complaint of shortness of breath.  Patient has history of advanced lung cancer, congestive heart failure and COPD.  He is had gradually worsening dyspnea and is status post breathing treatment in the ED prior to my evaluation.  During my evaluation patient is not in any shortness of breath or in respiratory distress and his lung exam, besides extremely poor aeration which could be his baseline, does not  reveal any specific abnormalities.  Patient does have mild JVD, but he denies any orthopnea or paroxysmal nocturnal dyspnea.  EKG has no acute findings.  Doubt PE at this time.  Likely the underlying etiology for the worsening shortness of breath is multifactorial - and PE is not high in the ddx.  We will give nebs and then reassess. Final Clinical Impressions(s) / ED Diagnoses   Final diagnoses:  COPD exacerbation ([place])  Dyspnea, unspecified type  Malignant neoplasm of left lung, unspecified part of lung [facility])    ED Discharge Orders        Ordered    albuterol (PROVENTIL HFA;VENTOLIN HFA) 108 (90 Base) MCG/ACT inhaler  Every 6 hours PRN     09/18/17 1521    predniSONE (DELTASONE) 10 MG tablet  Daily     09/18/17 1521       Adrian Biles, MD 09/18/17 1359    Adrian Biles, MD 09/18/17 [place], Monnie Anspach, MD 09/18/17 1534

## 2017-09-18 NOTE — ED Notes (Signed)
MD at bedside. 

## 2017-09-20 ENCOUNTER — Ambulatory Visit: Payer: Medicare Other

## 2017-09-25 ENCOUNTER — Inpatient Hospital Stay: Payer: Medicare Other

## 2017-09-25 ENCOUNTER — Other Ambulatory Visit: Payer: Medicare Other

## 2017-09-25 DIAGNOSIS — C3412 Malignant neoplasm of upper lobe, left bronchus or lung: Secondary | ICD-10-CM | POA: Diagnosis not present

## 2017-09-25 DIAGNOSIS — J449 Chronic obstructive pulmonary disease, unspecified: Secondary | ICD-10-CM | POA: Diagnosis not present

## 2017-09-25 DIAGNOSIS — Z5111 Encounter for antineoplastic chemotherapy: Secondary | ICD-10-CM | POA: Diagnosis not present

## 2017-09-25 DIAGNOSIS — I10 Essential (primary) hypertension: Secondary | ICD-10-CM | POA: Diagnosis not present

## 2017-09-25 DIAGNOSIS — C3492 Malignant neoplasm of unspecified part of left bronchus or lung: Secondary | ICD-10-CM

## 2017-09-25 DIAGNOSIS — Z5112 Encounter for antineoplastic immunotherapy: Secondary | ICD-10-CM | POA: Diagnosis not present

## 2017-09-25 DIAGNOSIS — R0609 Other forms of dyspnea: Secondary | ICD-10-CM | POA: Diagnosis not present

## 2017-09-25 LAB — CBC WITH DIFFERENTIAL (CANCER CENTER ONLY)
Basophils Absolute: 0 10*3/uL (ref 0.0–0.1)
Basophils Relative: 0 %
EOS PCT: 0 %
Eosinophils Absolute: 0 10*3/uL (ref 0.0–0.5)
HEMATOCRIT: 37.4 % — AB (ref 38.4–49.9)
Hemoglobin: 11.8 g/dL — ABNORMAL LOW (ref 13.0–17.1)
LYMPHS ABS: 0.9 10*3/uL (ref 0.9–3.3)
LYMPHS PCT: 9 %
MCH: 29.2 pg (ref 27.2–33.4)
MCHC: 31.6 g/dL — AB (ref 32.0–36.0)
MCV: 92.6 fL (ref 79.3–98.0)
MONO ABS: 0.8 10*3/uL (ref 0.1–0.9)
MONOS PCT: 8 %
NEUTROS ABS: 7.9 10*3/uL — AB (ref 1.5–6.5)
Neutrophils Relative %: 83 %
PLATELETS: 233 10*3/uL (ref 140–400)
RBC: 4.04 MIL/uL — ABNORMAL LOW (ref 4.20–5.82)
RDW: 17.8 % — AB (ref 11.0–14.6)
WBC Count: 9.6 10*3/uL (ref 4.0–10.3)

## 2017-09-25 LAB — CMP (CANCER CENTER ONLY)
ALT: 15 U/L (ref 0–44)
ANION GAP: 8 (ref 5–15)
AST: 17 U/L (ref 15–41)
Albumin: 3.2 g/dL — ABNORMAL LOW (ref 3.5–5.0)
Alkaline Phosphatase: 90 U/L (ref 38–126)
BILIRUBIN TOTAL: 0.3 mg/dL (ref 0.3–1.2)
BUN: 16 mg/dL (ref 8–23)
CHLORIDE: 104 mmol/L (ref 98–111)
CO2: 27 mmol/L (ref 22–32)
Calcium: 9.5 mg/dL (ref 8.9–10.3)
Creatinine: 1.18 mg/dL (ref 0.61–1.24)
GFR, Est AFR Am: >60 mL/min (ref >60)
GFR, Estimated: >60 mL/min (ref >60)
GLUCOSE: 102 mg/dL — AB (ref 70–99)
POTASSIUM: 3.5 mmol/L (ref 3.5–5.1)
SODIUM: 139 mmol/L (ref 135–145)
TOTAL PROTEIN: 6.9 g/dL (ref 6.5–8.1)

## 2017-09-30 ENCOUNTER — Ambulatory Visit (HOSPITAL_COMMUNITY)
Admission: RE | Admit: 2017-09-30 | Discharge: 2017-09-30 | Disposition: A | Discharge status: Home / Self Care | Payer: Medicare Other | Source: Ambulatory Visit | Attending: Nurse Practitioner | Admitting: Nurse Practitioner

## 2017-09-30 ENCOUNTER — Encounter (HOSPITAL_COMMUNITY): Payer: Self-pay

## 2017-09-30 DIAGNOSIS — J984 Other disorders of lung: Secondary | ICD-10-CM | POA: Insufficient documentation

## 2017-09-30 DIAGNOSIS — C3490 Malignant neoplasm of unspecified part of unspecified bronchus or lung: Secondary | ICD-10-CM | POA: Diagnosis not present

## 2017-09-30 DIAGNOSIS — C3492 Malignant neoplasm of unspecified part of left bronchus or lung: Secondary | ICD-10-CM | POA: Diagnosis not present

## 2017-09-30 MED ORDER — IOPAMIDOL (ISOVUE-300) INJECTION 61%
75.0000 mL | Freq: Once | INTRAVENOUS | Status: AC | PRN
  Administered 2017-09-30: 75 mL via INTRAVENOUS

## 2017-10-01 NOTE — Progress Notes (Signed)
[facility] OFFICE PROGRESS NOTE  Charolette Forward, MD [place] [address]  DIAGNOSIS:Stage IIIa (T2b, N2, M0) non-small cell lung cancer, squamous cell carcinoma presented with left upper lobe lung mass in addition to AP window lymphadenopathy diagnosed in February 2018.  PRIOR THERAPY: 1) Concurrent chemoradiation therapy. He received radiation for approximately 2-3 weeks prior to starting his chemotherapy. He completed his radiation on 07/05/2016. He received 2 doses of weekly carboplatin for an AUC of 2 and paclitaxel 45 mg meter squared which was last given on 07/03/2016. 2) consolidation treatment with immunotherapy with Imfinzi (Durvalumab) 10 MG/KG every 2 weeks. First dose 08/22/2016. Status post 2 cycles. Last dose was given September 19, 2016 discontinued secondary to intolerance and significant skin rash. 3)Systemic chemotherapy with carboplatin for AUC of 5 and paclitaxel 175 mg/M2 every 3 weeks. First dose February 06, 2017.Status post6cycles.  CURRENT THERAPY:Docetaxel 75 mg/m with Cyramza 10 mg/kg given every 3 weeks. First dose 08/02/2017.  Status post 3 cycles.  INTERVAL HISTORY: [name] 67 y.o. male returns for routine follow-up visit by himself.  The patient is feeling fine today and has no specific complaints.  He denies fevers and chills.  Denies chest pain, cough, hemoptysis.  He has intermittent shortness of breath with exertion.  Denies nausea, vomiting, constipation, diarrhea.  He reports a good appetite but he has lost 1 pound since his last visit.  He had a restaging CT scan and is here to discuss the results.  MEDICAL HISTORY: Past Medical History:  Diagnosis Date  . AAA (abdominal aortic aneurysm) ([place])   . Abdominal aortic aneurysm ([place])    AAA And bilateral common iliac artery aneurysms  . BPH (benign prostatic hyperplasia)   . CAD (coronary artery disease)    Dr Ron Parker @ [facility]   . Chronic mental  illness 04/21/2011   Diagnosis unclear,  Family provides good care  . COPD (chronic obstructive pulmonary disease) ([place]) 04/21/2011   smoking  . Drug-induced skin rash 09/05/2016  . Ejection fraction    EF 35-40%, echo, May, 2010  . Hyperlipidemia   . Hyperplastic colon polyp 04/21/2011  . Hypertension   . Preop cardiovascular exam    Cardiac clearance for major vascular surgery, May, 2013  . Pulmonary nodule    Chest CT May, 2013, 2 small pulmonary nodules, needs appropriate followup,  this CT was not ordered by the cardiology team  . PVD (peripheral vascular disease) ([place]) 04/21/2011  . Retention of urine   . Shortness of breath   . Squamous cell lung cancer, left ([place]) 06/13/2016  . Tobacco abuse     ALLERGIES:  is allergic to ace inhibitors.  MEDICATIONS:  Current Outpatient Medications  Medication Sig Dispense Refill  . dexamethasone (DECADRON) 4 MG tablet Take 2 tabs twice a day the day before, day of, and day after each cycle of chemo 20 tablet 1  . potassium chloride (K-DUR) 10 MEQ tablet Take 1 tablet (10 mEq total) by mouth daily. 7 tablet 0  . predniSONE (DELTASONE) 10 MG tablet Take 6 tablets (60 mg total) by mouth daily. 30 tablet 0  . psyllium (METAMUCIL) 58.6 % packet Take 1 packet by mouth daily. 30 each 12  . rosuvastatin (CRESTOR) 40 MG tablet TAKE 1 TABLET BY MOUTH EVERY DAY 90 tablet 0  . albuterol (PROVENTIL HFA;VENTOLIN HFA) 108 (90 Base) MCG/ACT inhaler Inhale 1-2 puffs into the lungs every 6 (six) hours as needed for wheezing or  shortness of breath. (Patient not taking: Reported on 10/02/2017) 1 Inhaler 0  . ondansetron (ZOFRAN ODT) 4 MG disintegrating tablet Take 1 tablet (4 mg total) by mouth every 8 (eight) hours as needed for nausea or vomiting. (Patient not taking: Reported on 09/18/2017) 10 tablet 0  . prochlorperazine (COMPAZINE) 10 MG tablet Take 1 tablet (10 mg total) by mouth every 6 (six) hours as needed for nausea or vomiting. (Patient not taking: Reported on  09/18/2017) 30 tablet 0  . sucralfate (CARAFATE) 1 GM/10ML suspension Take 10 mLs (1 g total) by mouth 4 (four) times daily -  with meals and at bedtime. (Patient not taking: Reported on 09/18/2017) 420 mL 0   No current facility-administered medications for this visit.    Facility-Administered Medications Ordered in Other Visits  Medication Dose Route Frequency Provider Last Rate Last Dose  . heparin lock flush 100 unit/mL  500 Units Intracatheter Once PRN Curt Bears, MD      . pegfilgrastim (NEULASTA ONPRO KIT) injection 6 mg  6 mg Subcutaneous Once Curt Bears, MD      . sodium chloride flush (NS) 0.9 % injection 10 mL  10 mL Intracatheter PRN Curt Bears, MD        SURGICAL HISTORY:  Past Surgical History:  Procedure Laterality Date  . ABDOMINAL AORTA STENT  07/2011  . ABDOMINAL AORTAGRAM N/A 07/26/2011   Procedure: ABDOMINAL Maxcine Ham;  Surgeon: Conrad Needmore, MD;  Location: [facility] CATH LAB;  Service: Cardiovascular;  Laterality: N/A;  . ABDOMINAL AORTIC ANEURYSM REPAIR  07/2011  . CORONARY ARTERY BYPASS GRAFT  07/2008   CABG X5/notes 07/12/2010  . EMBOLIZATION Left 07/26/2011   Procedure: EMBOLIZATION;  Surgeon: Conrad , MD;  Location: [facility] CATH LAB;  Service: Cardiovascular;  Laterality: Left;  . IR IMAGING GUIDED PORT INSERTION  09/05/2017  . TRANSURETHRAL RESECTION OF PROSTATE  11/29/2011   Procedure: TRANSURETHRAL RESECTION OF THE PROSTATE WITH GYRUS INSTRUMENTS;  Surgeon: Malka So, MD;  Location: [facility] ORS;  Service: Urology;  Laterality: N/A;  Prostate Ultrasound, and Biopsy  . VIDEO BRONCHOSCOPY Bilateral 04/12/2016   Procedure: VIDEO BRONCHOSCOPY WITH FLUORO;  Surgeon: Juanito Doom, MD;  Location: [facility];  Service: Cardiopulmonary;  Laterality: Bilateral;    REVIEW OF SYSTEMS:   Review of Systems  Constitutional: Negative for appetite change, chills, fatigue, fever and unexpected weight change.  HENT:   Negative for mouth sores, nosebleeds, sore throat and  trouble swallowing.   Eyes: Negative for eye problems and icterus.  Respiratory: Negative for cough, hemoptysis, and wheezing.  Intermittent shortness of breath with exertion. Cardiovascular: Negative for chest pain and leg swelling.  Gastrointestinal: Negative for abdominal pain, constipation, diarrhea, nausea and vomiting.  Genitourinary: Negative for bladder incontinence, difficulty urinating, dysuria, frequency and hematuria.   Musculoskeletal: Negative for back pain, gait problem, neck pain and neck stiffness.  Skin: Negative for itching and rash.  Neurological: Negative for dizziness, extremity weakness, gait problem, headaches, light-headedness and seizures.  Hematological: Negative for adenopathy. Does not bruise/bleed easily.  Psychiatric/Behavioral: Negative for confusion, depression and sleep disturbance. The patient is not nervous/anxious.     PHYSICAL EXAMINATION:  Blood pressure (!) 148/96, pulse (!) 56, temperature 97.6 F (36.4 C), temperature source Oral, resp. rate 18, height '6\' 3"'$  (1.905 m), weight 118 lb 11.2 oz (53.8 kg), SpO2 100 %.  ECOG PERFORMANCE STATUS: 1 - Symptomatic but completely ambulatory  Physical Exam  Constitutional: Oriented to person, place, and time. No distress.  HENT:  Head: Normocephalic and atraumatic.  Mouth/Throat: Oropharynx is clear and moist. No oropharyngeal exudate.  Eyes: Conjunctivae are normal. Right eye exhibits no discharge. Left eye exhibits no discharge. No scleral icterus.  Neck: Normal range of motion. Neck supple.  Cardiovascular: Normal rate, regular rhythm, normal heart sounds and intact distal pulses.   Pulmonary/Chest: Effort normal and breath sounds normal. No respiratory distress. No wheezes. No rales.  Abdominal: Soft. Bowel sounds are normal. Exhibits no distension and no mass. There is no tenderness.  Musculoskeletal: Normal range of motion. Exhibits no edema.  Lymphadenopathy:    No cervical adenopathy.   Neurological: Alert and oriented to person, place, and time. Exhibits normal muscle tone. Gait normal. Coordination normal.  Skin: Skin is warm and dry. No rash noted. Not diaphoretic. No erythema. No pallor.  Psychiatric: Mood, memory and judgment normal.  Vitals reviewed.  LABORATORY DATA: Lab Results  Component Value Date   WBC 10.0 10/02/2017   HGB 12.3 (L) 10/02/2017   HCT 38.7 10/02/2017   MCV 91.9 10/02/2017   PLT 187 10/02/2017      Chemistry      Component Value Date/Time   NA 140 10/02/2017 1037   NA 139 03/13/2017 1059   K 4.0 10/02/2017 1037   K 3.9 03/13/2017 1059   CL 102 10/02/2017 1037   CO2 26 10/02/2017 1037   CO2 26 03/13/2017 1059   BUN 15 10/02/2017 1037   BUN 18.6 03/13/2017 1059   CREATININE 0.93 10/02/2017 1037   CREATININE 1.2 03/13/2017 1059      Component Value Date/Time   CALCIUM 10.3 10/02/2017 1037   CALCIUM 8.9 03/13/2017 1059   ALKPHOS 98 10/02/2017 1037   ALKPHOS 118 03/13/2017 1059   AST 17 10/02/2017 1037   AST 16 03/13/2017 1059   ALT 10 10/02/2017 1037   ALT 14 03/13/2017 1059   BILITOT 0.3 10/02/2017 1037   BILITOT 0.36 03/13/2017 1059       RADIOGRAPHIC STUDIES:  Dg Chest 2 View  Result Date: 09/18/2017 CLINICAL DATA:  Shortness of breath and weakness today. History of left lung malignancy on chemotherapy. Six months of weight loss. EXAM: CHEST - 2 VIEW COMPARISON:  CT scan of the chest dated Jul 16, 2017 FINDINGS: The lungs remain hyperinflated with hemidiaphragm flattening. There is persistent soft tissue fullness in the medial aspect of the left upper lobe. This is slightly less conspicuous than on the previous chest CT scan. There is no pleural effusion, alveolar infiltrate, or pneumothorax. The heart and pulmonary vascularity are normal. The patient has undergone previous CABG. There is calcification in the wall of the aortic arch. The power port catheter tip projects over the junction of the middle and distal thirds of the  SVC. There is no acute bony abnormality. IMPRESSION: Marked hyperinflation consistent with COPD. Persistent abnormal soft tissue mass in the left apex slightly less prominent today. No acute pneumonia nor CHF. Thoracic aortic atherosclerosis. Electronically Signed   By: David  Martinique M.D.   On: 09/18/2017 10:30   Ct Chest W Contrast  Result Date: 09/30/2017 CLINICAL DATA:  Patient with history of lung cancer diagnosed 03/2016. Follow-up exam. EXAM: CT CHEST WITH CONTRAST TECHNIQUE: Multidetector CT imaging of the chest was performed during intravenous contrast administration. CONTRAST:  40m ISOVUE-300 IOPAMIDOL (ISOVUE-300) INJECTION 61% COMPARISON:  Chest CT 07/16/2017. FINDINGS: Cardiovascular: Right anterior chest wall Port-A-Cath is present with tip terminating in the superior vena cava. Normal heart size. Trace fluid superior pericardial recess. Thoracic aortic and  coronary arterial vascular calcifications. Ascending thoracic aorta measures 3.7 cm. Mediastinum/Nodes: No enlarged axillary, mediastinal or hilar lymphadenopathy. Normal appearance of the esophagus. Lungs/Pleura: Central airways are patent. Centrilobular and paraseptal emphysematous change. Similar appearing 5 mm left upper lobe nodule (image 45; series 5). Previously described left upper lobe mass is now centrally cavitary and measures 4.4 x 3.3 cm, previously 3.6 x 3.4 cm at the same location. Interval decrease in size of soft tissue component involving the mass. Upper Abdomen: No acute process. Musculoskeletal: No aggressive or acute appearing osseous lesions. Re demonstrated collateral vasculature upper thoracic spine vertebral bodies. IMPRESSION: 1. Left upper lobe lesion demonstrates interval cavitation and decreased solid component however overall measures larger when compared to prior exam, when including both t22he peripheral soft tissue and central cavitary component. Overall there is less soft tissue than when compared to prior.  Electronically Signed   By: Lovey Newcomer M.D.   On: 09/30/2017 13:38   Ir Imaging Guided Port Insertion  Result Date: 09/05/2017 INDICATION: 67 year old male with stage IV squamous cell carcinoma of the left lung. He presents for port catheter placement for continued chemotherapy. EXAM: IMPLANTED PORT A CATH PLACEMENT WITH ULTRASOUND AND FLUOROSCOPIC GUIDANCE MEDICATIONS: 2 g Ancef; The antibiotic was administered within an appropriate time interval prior to skin puncture. ANESTHESIA/SEDATION: Versed 2 mg IV; Fentanyl 100 mcg IV; Moderate Sedation Time:  24 minutes The patient was continuously monitored during the procedure by the interventional radiology nurse under my direct supervision. FLUOROSCOPY TIME:  0 minutes, 18 seconds (1 mGy) COMPLICATIONS: None immediate. PROCEDURE: The right neck and chest was prepped with chlorhexidine, and draped in the usual sterile fashion using maximum barrier technique (cap and mask, sterile gown, sterile gloves, large sterile sheet, hand hygiene and cutaneous antiseptic). Antibiotic prophylaxis was provided with 2g Ancef administered IV one hour prior to skin incision. Local anesthesia was attained by infiltration with 1% lidocaine with epinephrine. Ultrasound demonstrated patency of the right internal jugular vein, and this was documented with an image. Under real-time ultrasound guidance, this vein was accessed with a 21 gauge micropuncture needle and image documentation was performed. A small dermatotomy was made at the access site with an 11 scalpel. A 0.018" wire was advanced into the SVC and the access needle exchanged for a 59F micropuncture vascular sheath. The 0.018" wire was then removed and a 0.035" wire advanced into the IVC. An appropriate location for the subcutaneous reservoir was selected below the clavicle and an incision was made through the skin and underlying soft tissues. The subcutaneous tissues were then dissected using a combination of blunt and sharp  surgical technique and a pocket was formed. A single lumen power injectable portacatheter was then tunneled through the subcutaneous tissues from the pocket to the dermatotomy and the port reservoir placed within the subcutaneous pocket. The venous access site was then serially dilated and a peel away vascular sheath placed over the wire. The wire was removed and the port catheter advanced into position under fluoroscopic guidance. The catheter tip is positioned in the upper right atrium. This was documented with a spot image. The portacatheter was then tested and found to flush and aspirate well. The port was flushed with saline followed by 100 units/mL heparinized saline. The pocket was then closed in two layers using first subdermal inverted interrupted absorbable sutures followed by a running subcuticular suture. The epidermis was then sealed with Dermabond. The dermatotomy at the venous access site was also sealed with Dermabond. IMPRESSION: Successful placement of  a right IJ approach Power Port with ultrasound and fluoroscopic guidance. The catheter is ready for use. Electronically Signed   By: Jacqulynn Cadet M.D.   On: 09/05/2017 16:00     ASSESSMENT/PLAN:  Stage IV squamous cell carcinoma of left lung ([place]) This is a very pleasant 67year old African-American male with unresectable a stage IIIa non-small cell lung cancer, status post a course of concurrent chemoradiation with weekly carboplatin and paclitaxel and tolerated the treatment well. His scan showed no evidence for disease progression and the patient was started on consolidation treatment with immunotherapy with Imfinzi (Durvalumab) status post 2 cycles. This treatment was discontinued secondary to a significant skin rash treated with steroids with recurrence after the patient was resumed again. He haswas thenon observation for severalfew months. Repeat CT scan of the chest showed improvement of the primary apical left upper lobe  cavitary mass but there was development of new 2.0 cm solid pulmonary nodules at the medial central margin of the primary mass in addition to another new 0.5 cm left upper lobe pulmonary nodule. These findings are suspicious for metastatic disease. The patient was started on systemic chemotherapy with carboplatin and paclitaxel status post6cycles. Restaging CT scan showed disease progression in the left lung.  He is now on treatment with docetaxel 75 mg meter squared and Cyramza 10 mg/kg every 3 weeks.  Status post 3 cycles.  He is tolerating his treatment fairly well with no concerning complaints.  He had a restaging CT scan is here to discuss the results.  The patient was seen with Dr. Julien Nordmann.  Restaging CT scan results were discussed with the patient which show no evidence of disease progression.  Recommend that he continue on his current treatment.  He will proceed with cycle 4 of treatment today as scheduled. The patient will have weekly labs.  He will follow-up in 3 weeks for evaluation prior to cycle #5.  The patient continues to smoke.  He was advised to quit.  The patient was advised to call immediately if he has any concerning symptoms in the interval. The patient voices understanding of current disease status and treatment options and is in agreement with the current care plan. All questions were answered. The patient knows to call the clinic with any problems, questions or concerns. We can certainly see the patient much sooner if necessary.   Orders Placed This Encounter  Procedures  . Amb Referral to Nutrition and Diabetic E    Referral Priority:   Routine    Referral Type:   Consultation    Referral Reason:   Specialty Services Required    Number of Visits Requested:   1   Mikey Bussing, DNP, AGPCNP-BC, AOCNP 10/02/17   ADDENDUM: Hematology/Oncology Attending: I had a face-to-face encounter with the patient today.  I recommended his care plan.  This is a very pleasant  67 years old African-American male with metastatic non-small cell lung cancer, squamous cell carcinoma status post a course of concurrent chemoradiation followed by treatment with 2 cycles of Imfinzi (Durvalumab) discontinued secondary to intolerance.  The patient was a started on first-line systemic chemotherapy with carboplatin and paclitaxel when he had evidence for disease progression and tolerated the treatment well for 6 cycles but unfortunately he has evidence for disease progression.  He is currently undergoing second line systemic chemotherapy with docetaxel and Cyramza status post 3 cycles and has been tolerating this treatment very well. He had a repeat CT scan of the chest, abdomen and pelvis  performed recently.  I personally and independently reviewed his a scan.  His scan showed no concerning findings for disease progression.  I recommended for the patient to continue his current treatment with docetaxel and Cyramza.  He will proceed with cycle #4 today.  The patient will come back for follow-up visit in 3 weeks for evaluation before starting cycle #5. The patient was advised to call immediately if she has any concerning symptoms in the interval. Disclaimer: This note was dictated with voice recognition software. Similar sounding words can inadvertently be transcribed and may be missed upon review. Eilleen Kempf, MD 10/02/17

## 2017-10-02 ENCOUNTER — Telehealth: Payer: Self-pay | Admitting: Internal Medicine

## 2017-10-02 ENCOUNTER — Inpatient Hospital Stay: Payer: Medicare Other

## 2017-10-02 ENCOUNTER — Inpatient Hospital Stay (HOSPITAL_BASED_OUTPATIENT_CLINIC_OR_DEPARTMENT_OTHER): Payer: Medicare Other | Admitting: Oncology

## 2017-10-02 ENCOUNTER — Encounter: Payer: Medicare Other | Admitting: Nutrition

## 2017-10-02 ENCOUNTER — Encounter: Payer: Self-pay | Admitting: Oncology

## 2017-10-02 VITALS — BP 148/96 | HR 56 | Temp 97.6°F | Resp 18 | Ht 75.0 in | Wt 118.7 lb

## 2017-10-02 DIAGNOSIS — F17210 Nicotine dependence, cigarettes, uncomplicated: Secondary | ICD-10-CM

## 2017-10-02 DIAGNOSIS — E785 Hyperlipidemia, unspecified: Secondary | ICD-10-CM | POA: Diagnosis not present

## 2017-10-02 DIAGNOSIS — Z79899 Other long term (current) drug therapy: Secondary | ICD-10-CM

## 2017-10-02 DIAGNOSIS — C3492 Malignant neoplasm of unspecified part of left bronchus or lung: Secondary | ICD-10-CM

## 2017-10-02 DIAGNOSIS — J449 Chronic obstructive pulmonary disease, unspecified: Secondary | ICD-10-CM | POA: Diagnosis not present

## 2017-10-02 DIAGNOSIS — Z5112 Encounter for antineoplastic immunotherapy: Secondary | ICD-10-CM | POA: Diagnosis not present

## 2017-10-02 DIAGNOSIS — I10 Essential (primary) hypertension: Secondary | ICD-10-CM | POA: Diagnosis not present

## 2017-10-02 DIAGNOSIS — R200 Anesthesia of skin: Secondary | ICD-10-CM | POA: Diagnosis not present

## 2017-10-02 DIAGNOSIS — Z5111 Encounter for antineoplastic chemotherapy: Secondary | ICD-10-CM | POA: Diagnosis not present

## 2017-10-02 DIAGNOSIS — C3412 Malignant neoplasm of upper lobe, left bronchus or lung: Secondary | ICD-10-CM

## 2017-10-02 DIAGNOSIS — R0609 Other forms of dyspnea: Secondary | ICD-10-CM

## 2017-10-02 DIAGNOSIS — Z7952 Long term (current) use of systemic steroids: Secondary | ICD-10-CM | POA: Diagnosis not present

## 2017-10-02 LAB — CBC WITH DIFFERENTIAL (CANCER CENTER ONLY)
Basophils Absolute: 0 10*3/uL (ref 0.0–0.1)
Basophils Relative: 0 %
Eosinophils Absolute: 0 10*3/uL (ref 0.0–0.5)
Eosinophils Relative: 0 %
HCT: 38.7 % (ref 38.4–49.9)
HEMOGLOBIN: 12.3 g/dL — AB (ref 13.0–17.1)
LYMPHS ABS: 0.5 10*3/uL — AB (ref 0.9–3.3)
LYMPHS PCT: 5 %
MCH: 29.2 pg (ref 27.2–33.4)
MCHC: 31.8 g/dL — ABNORMAL LOW (ref 32.0–36.0)
MCV: 91.9 fL (ref 79.3–98.0)
Monocytes Absolute: 0.4 10*3/uL (ref 0.1–0.9)
Monocytes Relative: 4 %
NEUTROS PCT: 91 %
Neutro Abs: 9.2 10*3/uL — ABNORMAL HIGH (ref 1.5–6.5)
Platelet Count: 187 10*3/uL (ref 140–400)
RBC: 4.21 MIL/uL (ref 4.20–5.82)
RDW: 17.8 % — ABNORMAL HIGH (ref 11.0–14.6)
WBC: 10 10*3/uL (ref 4.0–10.3)

## 2017-10-02 LAB — CMP (CANCER CENTER ONLY)
ALT: 10 U/L (ref 0–44)
ANION GAP: 12 (ref 5–15)
AST: 17 U/L (ref 15–41)
Albumin: 3.3 g/dL — ABNORMAL LOW (ref 3.5–5.0)
Alkaline Phosphatase: 98 U/L (ref 38–126)
BILIRUBIN TOTAL: 0.3 mg/dL (ref 0.3–1.2)
BUN: 15 mg/dL (ref 8–23)
CHLORIDE: 102 mmol/L (ref 98–111)
CO2: 26 mmol/L (ref 22–32)
Calcium: 10.3 mg/dL (ref 8.9–10.3)
Creatinine: 0.93 mg/dL (ref 0.61–1.24)
Glucose, Bld: 105 mg/dL — ABNORMAL HIGH (ref 70–99)
POTASSIUM: 4 mmol/L (ref 3.5–5.1)
Sodium: 140 mmol/L (ref 135–145)
Total Protein: 7.6 g/dL (ref 6.5–8.1)

## 2017-10-02 MED ORDER — DEXAMETHASONE SODIUM PHOSPHATE 10 MG/ML IJ SOLN
10.0000 mg | Freq: Once | INTRAMUSCULAR | Status: AC
  Administered 2017-10-02: 10 mg via INTRAVENOUS

## 2017-10-02 MED ORDER — SODIUM CHLORIDE 0.9 % IV SOLN
10.0000 mg/kg | Freq: Once | INTRAVENOUS | Status: AC
  Administered 2017-10-02: 600 mg via INTRAVENOUS
  Filled 2017-10-02: qty 50

## 2017-10-02 MED ORDER — PEGFILGRASTIM 6 MG/0.6ML ~~LOC~~ PSKT
PREFILLED_SYRINGE | SUBCUTANEOUS | Status: AC
  Filled 2017-10-02: qty 0.6

## 2017-10-02 MED ORDER — PEGFILGRASTIM 6 MG/0.6ML ~~LOC~~ PSKT
6.0000 mg | PREFILLED_SYRINGE | Freq: Once | SUBCUTANEOUS | Status: AC
  Administered 2017-10-02: 6 mg via SUBCUTANEOUS

## 2017-10-02 MED ORDER — DIPHENHYDRAMINE HCL 50 MG/ML IJ SOLN
INTRAMUSCULAR | Status: AC
  Filled 2017-10-02: qty 1

## 2017-10-02 MED ORDER — SODIUM CHLORIDE 0.9 % IV SOLN
75.0000 mg/m2 | Freq: Once | INTRAVENOUS | Status: AC
  Administered 2017-10-02: 130 mg via INTRAVENOUS
  Filled 2017-10-02: qty 13

## 2017-10-02 MED ORDER — ACETAMINOPHEN 325 MG PO TABS
650.0000 mg | ORAL_TABLET | Freq: Once | ORAL | Status: AC
  Administered 2017-10-02: 650 mg via ORAL

## 2017-10-02 MED ORDER — SODIUM CHLORIDE 0.9% FLUSH
10.0000 mL | INTRAVENOUS | Status: DC | PRN
  Administered 2017-10-02: 10 mL
  Filled 2017-10-02: qty 10

## 2017-10-02 MED ORDER — ACETAMINOPHEN 325 MG PO TABS
ORAL_TABLET | ORAL | Status: AC
  Filled 2017-10-02: qty 2

## 2017-10-02 MED ORDER — SODIUM CHLORIDE 0.9 % IV SOLN
Freq: Once | INTRAVENOUS | Status: AC
  Administered 2017-10-02: 13:00:00 via INTRAVENOUS
  Filled 2017-10-02: qty 250

## 2017-10-02 MED ORDER — DEXAMETHASONE SODIUM PHOSPHATE 10 MG/ML IJ SOLN
INTRAMUSCULAR | Status: AC
  Filled 2017-10-02: qty 1

## 2017-10-02 MED ORDER — DIPHENHYDRAMINE HCL 50 MG/ML IJ SOLN
50.0000 mg | Freq: Once | INTRAMUSCULAR | Status: AC
  Administered 2017-10-02: 50 mg via INTRAVENOUS

## 2017-10-02 MED ORDER — HEPARIN SOD (PORK) LOCK FLUSH 100 UNIT/ML IV SOLN
500.0000 [IU] | Freq: Once | INTRAVENOUS | Status: AC | PRN
  Administered 2017-10-02: 500 [IU]
  Filled 2017-10-02: qty 5

## 2017-10-02 NOTE — Telephone Encounter (Signed)
Appointments scheduled AVS/Calendar printed/ In basket message to Audie Clear regarding NO FLUSH appointment time available/ per 7/24 los

## 2017-10-02 NOTE — Assessment & Plan Note (Signed)
This is a very pleasant 67year old African-American male with unresectable a stage IIIa non-small cell lung cancer, status post a course of concurrent chemoradiation with weekly carboplatin and paclitaxel and tolerated the treatment well. His scan showed no evidence for disease progression and the patient was started on consolidation treatment with immunotherapy with Imfinzi (Durvalumab) status post 2 cycles. This treatment was discontinued secondary to a significant skin rash treated with steroids with recurrence after the patient was resumed again. He haswas thenon observation for severalfew months. Repeat CT scan of the chest showed improvement of the primary apical left upper lobe cavitary mass but there was development of new 2.0 cm solid pulmonary nodules at the medial central margin of the primary mass in addition to another new 0.5 cm left upper lobe pulmonary nodule. These findings are suspicious for metastatic disease. The patient was started on systemic chemotherapy with carboplatin and paclitaxel status post6cycles. Restaging CT scan showed disease progression in the left lung.  He is now on treatment with docetaxel 75 mg meter squared and Cyramza 10 mg/kg every 3 weeks.  Status post 3 cycles.  He is tolerating his treatment fairly well with no concerning complaints.  He had a restaging CT scan is here to discuss the results.  The patient was seen with Dr. Julien Nordmann.  Restaging CT scan results were discussed with the patient which show no evidence of disease progression.  Recommend that he continue on his current treatment.  He will proceed with cycle 4 of treatment today as scheduled. The patient will have weekly labs.  He will follow-up in 3 weeks for evaluation prior to cycle #5.  The patient continues to smoke.  He was advised to quit.  The patient was advised to call immediately if he has any concerning symptoms in the interval. The patient voices understanding of current disease  status and treatment options and is in agreement with the current care plan. All questions were answered. The patient knows to call the clinic with any problems, questions or concerns. We can certainly see the patient much sooner if necessary.

## 2017-10-02 NOTE — Patient Instructions (Signed)
[facility] Discharge Instructions for Patients Receiving Chemotherapy  Today you received the following chemotherapy agents Cyramza,Taxotere, Neulasta onpro To help prevent nausea and vomiting after your treatment, we encourage you to take your nausea medication as directed   If you develop nausea and vomiting that is not controlled by your nausea medication, call the clinic.   BELOW ARE SYMPTOMS THAT SHOULD BE REPORTED IMMEDIATELY:  *FEVER GREATER THAN 100.5 F  *CHILLS WITH OR WITHOUT FEVER  NAUSEA AND VOMITING THAT IS NOT CONTROLLED WITH YOUR NAUSEA MEDICATION  *UNUSUAL SHORTNESS OF BREATH  *UNUSUAL BRUISING OR BLEEDING  TENDERNESS IN MOUTH AND THROAT WITH OR WITHOUT PRESENCE OF ULCERS  *URINARY PROBLEMS  *BOWEL PROBLEMS  UNUSUAL RASH Items with * indicate a potential emergency and should be followed up as soon as possible.  Feel free to call the clinic should you have any questions or concerns. The clinic phone number is [phone].  Please show the [facility] at check-in to the Emergency Department and triage nurse.

## 2017-10-09 ENCOUNTER — Ambulatory Visit: Payer: Medicare Other | Admitting: Oncology

## 2017-10-09 ENCOUNTER — Inpatient Hospital Stay: Payer: Medicare Other

## 2017-10-09 DIAGNOSIS — C3492 Malignant neoplasm of unspecified part of left bronchus or lung: Secondary | ICD-10-CM

## 2017-10-09 DIAGNOSIS — I10 Essential (primary) hypertension: Secondary | ICD-10-CM | POA: Diagnosis not present

## 2017-10-09 DIAGNOSIS — Z95828 Presence of other vascular implants and grafts: Secondary | ICD-10-CM | POA: Insufficient documentation

## 2017-10-09 DIAGNOSIS — R0609 Other forms of dyspnea: Secondary | ICD-10-CM | POA: Diagnosis not present

## 2017-10-09 DIAGNOSIS — Z5111 Encounter for antineoplastic chemotherapy: Secondary | ICD-10-CM | POA: Diagnosis not present

## 2017-10-09 DIAGNOSIS — Z5112 Encounter for antineoplastic immunotherapy: Secondary | ICD-10-CM | POA: Diagnosis not present

## 2017-10-09 DIAGNOSIS — J449 Chronic obstructive pulmonary disease, unspecified: Secondary | ICD-10-CM | POA: Diagnosis not present

## 2017-10-09 DIAGNOSIS — C3412 Malignant neoplasm of upper lobe, left bronchus or lung: Secondary | ICD-10-CM | POA: Diagnosis not present

## 2017-10-09 LAB — CBC WITH DIFFERENTIAL (CANCER CENTER ONLY)
BASOS ABS: 0 10*3/uL (ref 0.0–0.1)
BASOS PCT: 1 %
EOS PCT: 1 %
Eosinophils Absolute: 0 10*3/uL (ref 0.0–0.5)
HEMATOCRIT: 35 % — AB (ref 38.4–49.9)
Hemoglobin: 11.3 g/dL — ABNORMAL LOW (ref 13.0–17.1)
LYMPHS PCT: 12 %
Lymphs Abs: 0.7 10*3/uL — ABNORMAL LOW (ref 0.9–3.3)
MCH: 29.4 pg (ref 27.2–33.4)
MCHC: 32.2 g/dL (ref 32.0–36.0)
MCV: 91.5 fL (ref 79.3–98.0)
Monocytes Absolute: 0.9 10*3/uL (ref 0.1–0.9)
Monocytes Relative: 15 %
NEUTROS ABS: 4.3 10*3/uL (ref 1.5–6.5)
Neutrophils Relative %: 71 %
PLATELETS: 155 10*3/uL (ref 140–400)
RBC: 3.83 MIL/uL — AB (ref 4.20–5.82)
RDW: 18.9 % — ABNORMAL HIGH (ref 11.0–14.6)
WBC: 6 10*3/uL (ref 4.0–10.3)

## 2017-10-09 LAB — CMP (CANCER CENTER ONLY)
ALT: 17 U/L (ref 0–44)
AST: 20 U/L (ref 15–41)
Albumin: 3.2 g/dL — ABNORMAL LOW (ref 3.5–5.0)
Alkaline Phosphatase: 105 U/L (ref 38–126)
Anion gap: 8 (ref 5–15)
BUN: 11 mg/dL (ref 8–23)
CHLORIDE: 105 mmol/L (ref 98–111)
CO2: 27 mmol/L (ref 22–32)
Calcium: 9.3 mg/dL (ref 8.9–10.3)
Creatinine: 0.94 mg/dL (ref 0.61–1.24)
Glucose, Bld: 93 mg/dL (ref 70–99)
POTASSIUM: 3.6 mmol/L (ref 3.5–5.1)
Sodium: 140 mmol/L (ref 135–145)
TOTAL PROTEIN: 6.7 g/dL (ref 6.5–8.1)
Total Bilirubin: 0.4 mg/dL (ref 0.3–1.2)

## 2017-10-09 MED ORDER — HEPARIN SOD (PORK) LOCK FLUSH 100 UNIT/ML IV SOLN
500.0000 [IU] | Freq: Once | INTRAVENOUS | Status: AC | PRN
  Administered 2017-10-09: 500 [IU]
  Filled 2017-10-09: qty 5

## 2017-10-09 MED ORDER — SODIUM CHLORIDE 0.9% FLUSH
10.0000 mL | INTRAVENOUS | Status: DC | PRN
  Administered 2017-10-09: 10 mL
  Filled 2017-10-09: qty 10

## 2017-10-16 ENCOUNTER — Inpatient Hospital Stay: Payer: Medicare Other | Attending: Internal Medicine

## 2017-10-16 ENCOUNTER — Inpatient Hospital Stay: Payer: Medicare Other

## 2017-10-16 DIAGNOSIS — I739 Peripheral vascular disease, unspecified: Secondary | ICD-10-CM | POA: Diagnosis not present

## 2017-10-16 DIAGNOSIS — R200 Anesthesia of skin: Secondary | ICD-10-CM | POA: Insufficient documentation

## 2017-10-16 DIAGNOSIS — I10 Essential (primary) hypertension: Secondary | ICD-10-CM | POA: Insufficient documentation

## 2017-10-16 DIAGNOSIS — N400 Enlarged prostate without lower urinary tract symptoms: Secondary | ICD-10-CM | POA: Diagnosis not present

## 2017-10-16 DIAGNOSIS — I2510 Atherosclerotic heart disease of native coronary artery without angina pectoris: Secondary | ICD-10-CM | POA: Diagnosis not present

## 2017-10-16 DIAGNOSIS — Z5111 Encounter for antineoplastic chemotherapy: Secondary | ICD-10-CM | POA: Insufficient documentation

## 2017-10-16 DIAGNOSIS — C3492 Malignant neoplasm of unspecified part of left bronchus or lung: Secondary | ICD-10-CM

## 2017-10-16 DIAGNOSIS — Z79899 Other long term (current) drug therapy: Secondary | ICD-10-CM | POA: Insufficient documentation

## 2017-10-16 DIAGNOSIS — Z923 Personal history of irradiation: Secondary | ICD-10-CM | POA: Insufficient documentation

## 2017-10-16 DIAGNOSIS — Z7689 Persons encountering health services in other specified circumstances: Secondary | ICD-10-CM | POA: Diagnosis not present

## 2017-10-16 DIAGNOSIS — Z7952 Long term (current) use of systemic steroids: Secondary | ICD-10-CM | POA: Insufficient documentation

## 2017-10-16 DIAGNOSIS — R5383 Other fatigue: Secondary | ICD-10-CM | POA: Diagnosis not present

## 2017-10-16 DIAGNOSIS — Z5112 Encounter for antineoplastic immunotherapy: Secondary | ICD-10-CM | POA: Insufficient documentation

## 2017-10-16 DIAGNOSIS — E785 Hyperlipidemia, unspecified: Secondary | ICD-10-CM | POA: Insufficient documentation

## 2017-10-16 DIAGNOSIS — J449 Chronic obstructive pulmonary disease, unspecified: Secondary | ICD-10-CM | POA: Diagnosis not present

## 2017-10-16 DIAGNOSIS — Z95828 Presence of other vascular implants and grafts: Secondary | ICD-10-CM

## 2017-10-16 DIAGNOSIS — C3412 Malignant neoplasm of upper lobe, left bronchus or lung: Secondary | ICD-10-CM | POA: Insufficient documentation

## 2017-10-16 LAB — CBC WITH DIFFERENTIAL (CANCER CENTER ONLY)
Basophils Absolute: 0 10*3/uL (ref 0.0–0.1)
Basophils Relative: 0 %
EOS PCT: 0 %
Eosinophils Absolute: 0 10*3/uL (ref 0.0–0.5)
HEMATOCRIT: 35.9 % — AB (ref 38.4–49.9)
Hemoglobin: 11.2 g/dL — ABNORMAL LOW (ref 13.0–17.1)
LYMPHS ABS: 1.1 10*3/uL (ref 0.9–3.3)
LYMPHS PCT: 10 %
MCH: 29 pg (ref 27.2–33.4)
MCHC: 31.2 g/dL — ABNORMAL LOW (ref 32.0–36.0)
MCV: 93 fL (ref 79.3–98.0)
MONO ABS: 0.7 10*3/uL (ref 0.1–0.9)
Monocytes Relative: 6 %
NEUTROS ABS: 9.2 10*3/uL — AB (ref 1.5–6.5)
Neutrophils Relative %: 84 %
Platelet Count: 208 10*3/uL (ref 140–400)
RBC: 3.86 MIL/uL — ABNORMAL LOW (ref 4.20–5.82)
RDW: 18.6 % — AB (ref 11.0–14.6)
WBC Count: 11 10*3/uL — ABNORMAL HIGH (ref 4.0–10.3)

## 2017-10-16 LAB — CMP (CANCER CENTER ONLY)
ALBUMIN: 3 g/dL — AB (ref 3.5–5.0)
ALT: 11 U/L (ref 0–44)
ANION GAP: 10 (ref 5–15)
AST: 20 U/L (ref 15–41)
Alkaline Phosphatase: 120 U/L (ref 38–126)
BUN: 7 mg/dL — AB (ref 8–23)
CHLORIDE: 107 mmol/L (ref 98–111)
CO2: 25 mmol/L (ref 22–32)
Calcium: 8.6 mg/dL — ABNORMAL LOW (ref 8.9–10.3)
Creatinine: 0.83 mg/dL (ref 0.61–1.24)
GFR, Est AFR Am: >60 mL/min (ref >60)
GFR, Estimated: >60 mL/min (ref >60)
GLUCOSE: 141 mg/dL — AB (ref 70–99)
POTASSIUM: 3.6 mmol/L (ref 3.5–5.1)
SODIUM: 142 mmol/L (ref 135–145)
Total Bilirubin: 0.3 mg/dL (ref 0.3–1.2)
Total Protein: 6.6 g/dL (ref 6.5–8.1)

## 2017-10-16 MED ORDER — HEPARIN SOD (PORK) LOCK FLUSH 100 UNIT/ML IV SOLN
500.0000 [IU] | Freq: Once | INTRAVENOUS | Status: AC | PRN
  Administered 2017-10-16: 500 [IU]
  Filled 2017-10-16: qty 5

## 2017-10-16 MED ORDER — SODIUM CHLORIDE 0.9% FLUSH
10.0000 mL | INTRAVENOUS | Status: DC | PRN
  Administered 2017-10-16: 10 mL
  Filled 2017-10-16: qty 10

## 2017-10-22 ENCOUNTER — Inpatient Hospital Stay: Payer: Medicare Other

## 2017-10-22 ENCOUNTER — Telehealth: Payer: Self-pay | Admitting: Internal Medicine

## 2017-10-22 ENCOUNTER — Encounter: Payer: Self-pay | Admitting: Internal Medicine

## 2017-10-22 ENCOUNTER — Inpatient Hospital Stay (HOSPITAL_BASED_OUTPATIENT_CLINIC_OR_DEPARTMENT_OTHER): Payer: Medicare Other | Admitting: Internal Medicine

## 2017-10-22 VITALS — BP 101/77

## 2017-10-22 VITALS — BP 150/102 | HR 79 | Temp 98.1°F | Resp 18 | Ht 75.0 in | Wt 118.1 lb

## 2017-10-22 DIAGNOSIS — N400 Enlarged prostate without lower urinary tract symptoms: Secondary | ICD-10-CM

## 2017-10-22 DIAGNOSIS — C3492 Malignant neoplasm of unspecified part of left bronchus or lung: Secondary | ICD-10-CM

## 2017-10-22 DIAGNOSIS — R5383 Other fatigue: Secondary | ICD-10-CM | POA: Diagnosis not present

## 2017-10-22 DIAGNOSIS — Z5112 Encounter for antineoplastic immunotherapy: Secondary | ICD-10-CM | POA: Diagnosis not present

## 2017-10-22 DIAGNOSIS — R200 Anesthesia of skin: Secondary | ICD-10-CM

## 2017-10-22 DIAGNOSIS — Z79899 Other long term (current) drug therapy: Secondary | ICD-10-CM | POA: Diagnosis not present

## 2017-10-22 DIAGNOSIS — Z923 Personal history of irradiation: Secondary | ICD-10-CM | POA: Diagnosis not present

## 2017-10-22 DIAGNOSIS — Z7689 Persons encountering health services in other specified circumstances: Secondary | ICD-10-CM | POA: Diagnosis not present

## 2017-10-22 DIAGNOSIS — E785 Hyperlipidemia, unspecified: Secondary | ICD-10-CM

## 2017-10-22 DIAGNOSIS — C3412 Malignant neoplasm of upper lobe, left bronchus or lung: Secondary | ICD-10-CM

## 2017-10-22 DIAGNOSIS — I10 Essential (primary) hypertension: Secondary | ICD-10-CM | POA: Diagnosis not present

## 2017-10-22 DIAGNOSIS — J449 Chronic obstructive pulmonary disease, unspecified: Secondary | ICD-10-CM

## 2017-10-22 DIAGNOSIS — Z5111 Encounter for antineoplastic chemotherapy: Secondary | ICD-10-CM | POA: Diagnosis not present

## 2017-10-22 DIAGNOSIS — Z7952 Long term (current) use of systemic steroids: Secondary | ICD-10-CM | POA: Diagnosis not present

## 2017-10-22 DIAGNOSIS — I2510 Atherosclerotic heart disease of native coronary artery without angina pectoris: Secondary | ICD-10-CM

## 2017-10-22 DIAGNOSIS — Z95828 Presence of other vascular implants and grafts: Secondary | ICD-10-CM

## 2017-10-22 DIAGNOSIS — I739 Peripheral vascular disease, unspecified: Secondary | ICD-10-CM

## 2017-10-22 LAB — CBC WITH DIFFERENTIAL (CANCER CENTER ONLY)
BASOS ABS: 0.1 10*3/uL (ref 0.0–0.1)
BASOS PCT: 2 %
Eosinophils Absolute: 0 10*3/uL (ref 0.0–0.5)
Eosinophils Relative: 0 %
HEMATOCRIT: 34.5 % — AB (ref 38.4–49.9)
HEMOGLOBIN: 11 g/dL — AB (ref 13.0–17.1)
Lymphocytes Relative: 15 %
Lymphs Abs: 0.9 10*3/uL (ref 0.9–3.3)
MCH: 29.4 pg (ref 27.2–33.4)
MCHC: 32 g/dL (ref 32.0–36.0)
MCV: 91.9 fL (ref 79.3–98.0)
Monocytes Absolute: 0.8 10*3/uL (ref 0.1–0.9)
Monocytes Relative: 14 %
NEUTROS PCT: 69 %
Neutro Abs: 4.2 10*3/uL (ref 1.5–6.5)
Platelet Count: 296 10*3/uL (ref 140–400)
RBC: 3.75 MIL/uL — AB (ref 4.20–5.82)
RDW: 19.7 % — ABNORMAL HIGH (ref 11.0–14.6)
WBC: 6 10*3/uL (ref 4.0–10.3)

## 2017-10-22 LAB — CMP (CANCER CENTER ONLY)
ALK PHOS: 97 U/L (ref 38–126)
ALT: 14 U/L (ref 0–44)
ANION GAP: 11 (ref 5–15)
AST: 22 U/L (ref 15–41)
Albumin: 3.1 g/dL — ABNORMAL LOW (ref 3.5–5.0)
BUN: 15 mg/dL (ref 8–23)
CALCIUM: 8.9 mg/dL (ref 8.9–10.3)
CO2: 23 mmol/L (ref 22–32)
Chloride: 107 mmol/L (ref 98–111)
Creatinine: 0.84 mg/dL (ref 0.61–1.24)
GFR, Estimated: >60 mL/min (ref >60)
Glucose, Bld: 98 mg/dL (ref 70–99)
POTASSIUM: 3.9 mmol/L (ref 3.5–5.1)
SODIUM: 141 mmol/L (ref 135–145)
TOTAL PROTEIN: 6.8 g/dL (ref 6.5–8.1)

## 2017-10-22 MED ORDER — SODIUM CHLORIDE 0.9 % IV SOLN
10.0000 mg/kg | Freq: Once | INTRAVENOUS | Status: AC
  Administered 2017-10-22: 600 mg via INTRAVENOUS
  Filled 2017-10-22: qty 10

## 2017-10-22 MED ORDER — DIPHENHYDRAMINE HCL 50 MG/ML IJ SOLN
50.0000 mg | Freq: Once | INTRAMUSCULAR | Status: AC
  Administered 2017-10-22: 50 mg via INTRAVENOUS

## 2017-10-22 MED ORDER — HEPARIN SOD (PORK) LOCK FLUSH 100 UNIT/ML IV SOLN
500.0000 [IU] | Freq: Once | INTRAVENOUS | Status: AC | PRN
  Administered 2017-10-22: 500 [IU]
  Filled 2017-10-22: qty 5

## 2017-10-22 MED ORDER — SODIUM CHLORIDE 0.9 % IV SOLN
Freq: Once | INTRAVENOUS | Status: AC
  Administered 2017-10-22: 14:00:00 via INTRAVENOUS
  Filled 2017-10-22: qty 250

## 2017-10-22 MED ORDER — SODIUM CHLORIDE 0.9% FLUSH
10.0000 mL | INTRAVENOUS | Status: DC | PRN
  Administered 2017-10-22: 10 mL
  Filled 2017-10-22: qty 10

## 2017-10-22 MED ORDER — PEGFILGRASTIM 6 MG/0.6ML ~~LOC~~ PSKT
PREFILLED_SYRINGE | SUBCUTANEOUS | Status: AC
  Filled 2017-10-22: qty 0.6

## 2017-10-22 MED ORDER — DEXAMETHASONE SODIUM PHOSPHATE 10 MG/ML IJ SOLN
INTRAMUSCULAR | Status: AC
Start: 2017-10-22 — End: ?
  Filled 2017-10-22: qty 1

## 2017-10-22 MED ORDER — SODIUM CHLORIDE 0.9 % IV SOLN
75.0000 mg/m2 | Freq: Once | INTRAVENOUS | Status: AC
  Administered 2017-10-22: 130 mg via INTRAVENOUS
  Filled 2017-10-22: qty 13

## 2017-10-22 MED ORDER — PEGFILGRASTIM 6 MG/0.6ML ~~LOC~~ PSKT
6.0000 mg | PREFILLED_SYRINGE | Freq: Once | SUBCUTANEOUS | Status: AC
  Administered 2017-10-22: 6 mg via SUBCUTANEOUS

## 2017-10-22 MED ORDER — ACETAMINOPHEN 325 MG PO TABS
650.0000 mg | ORAL_TABLET | Freq: Once | ORAL | Status: AC
  Administered 2017-10-22: 650 mg via ORAL

## 2017-10-22 MED ORDER — DIPHENHYDRAMINE HCL 50 MG/ML IJ SOLN
INTRAMUSCULAR | Status: AC
  Filled 2017-10-22: qty 1

## 2017-10-22 MED ORDER — ACETAMINOPHEN 325 MG PO TABS
ORAL_TABLET | ORAL | Status: AC
  Filled 2017-10-22: qty 2

## 2017-10-22 MED ORDER — DEXAMETHASONE SODIUM PHOSPHATE 10 MG/ML IJ SOLN
10.0000 mg | Freq: Once | INTRAMUSCULAR | Status: AC
  Administered 2017-10-22: 10 mg via INTRAVENOUS

## 2017-10-22 NOTE — Progress Notes (Signed)
[place] [phone]   Fax:([phone]) [phone]  OFFICE PROGRESS NOTE  Charolette Forward, MD [phone] [address] [place] [address]  DIAGNOSIS: Stage IIIa (T2b, N2, M0) non-small cell lung cancer, squamous cell carcinoma presented with left upper lobe lung mass in addition to AP window lymphadenopathy diagnosed in February 2018.  PRIOR THERAPY:  1) Concurrent chemoradiation therapy. He received radiation for approximately 2-3 weeks prior to starting his chemotherapy. He completed his radiation on 07/05/2016. He received 2 doses of weekly carboplatin for an AUC of 2 and paclitaxel 45 mg meter squared which was last given on 07/03/2016. 2) consolidation treatment with immunotherapy with Imfinzi (Durvalumab) 10 MG/KG every 2 weeks. First dose 08/22/2016. Status post 2 cycles.  Last dose was given September 19, 2016 discontinued secondary to intolerance and significant skin rash. 3)Systemic chemotherapy with carboplatin for AUC of 5 and paclitaxel 175 mg/M2 every 3 weeks. First dose February 06, 2017.Status post6cycles.  CURRENT THERAPY:  Docetaxel 75 mg/m with Cyramza 10 mg/kg given every 3 weeks. First dose 08/02/2017.  Status post 4 cycles.  INTERVAL HISTORY: [name] 67 y.o. male returns to the clinic today for follow-up visit.  The patient is feeling fine today with no concerning complaints.  He continues to tolerate his treatment with docetaxel and Cyramza fairly well.  He denied having any chest pain, shortness of breath, cough or hemoptysis.  He denied having any recent weight loss or night sweats.  He has no nausea, vomiting, diarrhea or constipation.  He is here today for evaluation before starting cycle #5 of his treatment.  MEDICAL HISTORY: Past Medical History:  Diagnosis Date  . AAA (abdominal aortic aneurysm) ([place])   . Abdominal aortic aneurysm ([place])    AAA And bilateral common iliac artery aneurysms  . BPH (benign prostatic  hyperplasia)   . CAD (coronary artery disease)    Dr Ron Parker @ [facility]   . Chronic mental illness 04/21/2011   Diagnosis unclear,  Family provides good care  . COPD (chronic obstructive pulmonary disease) ([place]) 04/21/2011   smoking  . Drug-induced skin rash 09/05/2016  . Ejection fraction    EF 35-40%, echo, May, 2010  . Hyperlipidemia   . Hyperplastic colon polyp 04/21/2011  . Hypertension   . Preop cardiovascular exam    Cardiac clearance for major vascular surgery, May, 2013  . Pulmonary nodule    Chest CT May, 2013, 2 small pulmonary nodules, needs appropriate followup,  this CT was not ordered by the cardiology team  . PVD (peripheral vascular disease) ([place]) 04/21/2011  . Retention of urine   . Shortness of breath   . Squamous cell lung cancer, left ([place]) 06/13/2016  . Tobacco abuse     ALLERGIES:  is allergic to ace inhibitors.  MEDICATIONS:  Current Outpatient Medications  Medication Sig Dispense Refill  . albuterol (PROVENTIL HFA;VENTOLIN HFA) 108 (90 Base) MCG/ACT inhaler Inhale 1-2 puffs into the lungs every 6 (six) hours as needed for wheezing or shortness of breath. (Patient not taking: Reported on 10/02/2017) 1 Inhaler 0  . dexamethasone (DECADRON) 4 MG tablet Take 2 tabs twice a day the day before, day of, and day after each cycle of chemo 20 tablet 1  . ondansetron (ZOFRAN ODT) 4 MG disintegrating tablet Take 1 tablet (4 mg total) by mouth every 8 (eight) hours as needed for nausea or vomiting. (Patient not taking: Reported on 09/18/2017) 10 tablet 0  . potassium chloride (K-DUR)  10 MEQ tablet Take 1 tablet (10 mEq total) by mouth daily. 7 tablet 0  . predniSONE (DELTASONE) 10 MG tablet Take 6 tablets (60 mg total) by mouth daily. 30 tablet 0  . prochlorperazine (COMPAZINE) 10 MG tablet Take 1 tablet (10 mg total) by mouth every 6 (six) hours as needed for nausea or vomiting. (Patient not taking: Reported on 09/18/2017) 30 tablet 0  . psyllium (METAMUCIL) 58.6 % packet Take 1 packet  by mouth daily. 30 each 12  . rosuvastatin (CRESTOR) 40 MG tablet TAKE 1 TABLET BY MOUTH EVERY DAY 90 tablet 0  . sucralfate (CARAFATE) 1 GM/10ML suspension Take 10 mLs (1 g total) by mouth 4 (four) times daily -  with meals and at bedtime. (Patient not taking: Reported on 09/18/2017) 420 mL 0   No current facility-administered medications for this visit.     SURGICAL HISTORY:  Past Surgical History:  Procedure Laterality Date  . ABDOMINAL AORTA STENT  07/2011  . ABDOMINAL AORTAGRAM N/A 07/26/2011   Procedure: ABDOMINAL Maxcine Ham;  Surgeon: Conrad DeLand Southwest, MD;  Location: [facility] CATH LAB;  Service: Cardiovascular;  Laterality: N/A;  . ABDOMINAL AORTIC ANEURYSM REPAIR  07/2011  . CORONARY ARTERY BYPASS GRAFT  07/2008   CABG X5/notes 07/12/2010  . EMBOLIZATION Left 07/26/2011   Procedure: EMBOLIZATION;  Surgeon: Conrad Mediapolis, MD;  Location: [facility] CATH LAB;  Service: Cardiovascular;  Laterality: Left;  . IR IMAGING GUIDED PORT INSERTION  09/05/2017  . TRANSURETHRAL RESECTION OF PROSTATE  11/29/2011   Procedure: TRANSURETHRAL RESECTION OF THE PROSTATE WITH GYRUS INSTRUMENTS;  Surgeon: Malka So, MD;  Location: [facility] ORS;  Service: Urology;  Laterality: N/A;  Prostate Ultrasound, and Biopsy  . VIDEO BRONCHOSCOPY Bilateral 04/12/2016   Procedure: VIDEO BRONCHOSCOPY WITH FLUORO;  Surgeon: Juanito Doom, MD;  Location: [facility];  Service: Cardiopulmonary;  Laterality: Bilateral;    REVIEW OF SYSTEMS:  A comprehensive review of systems was negative.   PHYSICAL EXAMINATION: General appearance: alert, cooperative, fatigued and no distress Head: Normocephalic, without obvious abnormality, atraumatic Neck: no adenopathy, no JVD, supple, symmetrical, trachea midline and thyroid not enlarged, symmetric, no tenderness/mass/nodules Lymph nodes: Cervical, supraclavicular, and axillary nodes normal. Resp: clear to auscultation bilaterally Back: symmetric, no curvature. ROM normal. No CVA tenderness. Cardio: regular  rate and rhythm, S1, S2 normal, no murmur, click, rub or gallop GI: soft, non-tender; bowel sounds normal; no masses,  no organomegaly Extremities: extremities normal, atraumatic, no cyanosis or edema    ECOG PERFORMANCE STATUS: 1 - Symptomatic but completely ambulatory  Blood pressure (!) 150/102, pulse 79, temperature 98.1 F (36.7 C), temperature source Oral, resp. rate 18, height 6\' 3"  (1.905 m), weight 118 lb 1.6 oz (53.6 kg), SpO2 100 %.  LABORATORY DATA: Lab Results  Component Value Date   WBC 6.0 10/22/2017   HGB 11.0 (L) 10/22/2017   HCT 34.5 (L) 10/22/2017   MCV 91.9 10/22/2017   PLT 296 10/22/2017      Chemistry      Component Value Date/Time   NA 141 10/22/2017 1047   NA 139 03/13/2017 1059   K 3.9 10/22/2017 1047   K 3.9 03/13/2017 1059   CL 107 10/22/2017 1047   CO2 23 10/22/2017 1047   CO2 26 03/13/2017 1059   BUN 15 10/22/2017 1047   BUN 18.6 03/13/2017 1059   CREATININE 0.84 10/22/2017 1047   CREATININE 1.2 03/13/2017 1059      Component Value Date/Time   CALCIUM 8.9 10/22/2017 1047  CALCIUM 8.9 03/13/2017 1059   ALKPHOS 97 10/22/2017 1047   ALKPHOS 118 03/13/2017 1059   AST 22 10/22/2017 1047   AST 16 03/13/2017 1059   ALT 14 10/22/2017 1047   ALT 14 03/13/2017 1059   BILITOT <0.2 (L) 10/22/2017 1047   BILITOT 0.36 03/13/2017 1059       RADIOGRAPHIC STUDIES: Ct Chest W Contrast  Result Date: 09/30/2017 CLINICAL DATA:  Patient with history of lung cancer diagnosed 03/2016. Follow-up exam. EXAM: CT CHEST WITH CONTRAST TECHNIQUE: Multidetector CT imaging of the chest was performed during intravenous contrast administration. CONTRAST:  37mL ISOVUE-300 IOPAMIDOL (ISOVUE-300) INJECTION 61% COMPARISON:  Chest CT 07/16/2017. FINDINGS: Cardiovascular: Right anterior chest wall Port-A-Cath is present with tip terminating in the superior vena cava. Normal heart size. Trace fluid superior pericardial recess. Thoracic aortic and coronary arterial vascular  calcifications. Ascending thoracic aorta measures 3.7 cm. Mediastinum/Nodes: No enlarged axillary, mediastinal or hilar lymphadenopathy. Normal appearance of the esophagus. Lungs/Pleura: Central airways are patent. Centrilobular and paraseptal emphysematous change. Similar appearing 5 mm left upper lobe nodule (image 45; series 5). Previously described left upper lobe mass is now centrally cavitary and measures 4.4 x 3.3 cm, previously 3.6 x 3.4 cm at the same location. Interval decrease in size of soft tissue component involving the mass. Upper Abdomen: No acute process. Musculoskeletal: No aggressive or acute appearing osseous lesions. Re demonstrated collateral vasculature upper thoracic spine vertebral bodies. IMPRESSION: 1. Left upper lobe lesion demonstrates interval cavitation and decreased solid component however overall measures larger when compared to prior exam, when including both t22he peripheral soft tissue and central cavitary component. Overall there is less soft tissue than when compared to prior. Electronically Signed   By: Lovey Newcomer M.D.   On: 09/30/2017 13:38    ASSESSMENT AND PLAN:  This is a very pleasant 67 years old African-American male with unresectable a stage IIIa non-small cell lung cancer, status post a course of concurrent chemoradiation with weekly carboplatin and paclitaxel and tolerated the treatment well. His scan showed no evidence for disease progression and the patient was started on consolidation treatment with immunotherapy with Imfinzi (Durvalumab) status post 2 cycles.  This treatment was discontinued secondary to a significant skin rash treated with steroids with recurrence after the patient was resumed again. The patient was found to have disease progression and he was treated with systemic chemotherapy with carboplatin and paclitaxel for 6 cycles discontinued secondary to disease progression. He is currently undergoing systemic chemotherapy with second line  chemotherapy with docetaxel and Cyramza status post 4 cycles.  He has been tolerating this treatment well with no concerning complaints except for fatigue. I recommended for him to proceed with cycle #5 today as scheduled. I will see him back for follow-up visit in 3 weeks for evaluation before the next cycle of his treatment. He was advised to call immediately if he has any concerning symptoms in the interval. The patient voices understanding of current disease status and treatment options and is in agreement with the current care plan. All questions were answered. The patient knows to call the clinic with any problems, questions or concerns. We can certainly see the patient much sooner if necessary.   Disclaimer: This note was dictated with voice recognition software. Similar sounding words can inadvertently be transcribed and may not be corrected upon review.

## 2017-10-22 NOTE — Telephone Encounter (Signed)
Scheduled appt per 8/13 los - gave patient AVS and calender per los.

## 2017-10-22 NOTE — Progress Notes (Signed)
Nutrition Follow-up:  Patient with lung cancer followed by Dr. Julien Nordmann.  Patient receiving cyramza.  Met with patient in infusion today at request of Mikey Bussing, NP.  RN tells me that patient has drank 2 chocolate boost while in infusion and tolerated well.  Hard to gather information from patient as speech incomprehensible at times.  Often off topic.  No family present during visit.  Reports that he lives with his sister but that it is his aunts house (she is deceased).  Reports that sister prepares meals for him and buys the groceries.  Reports that he ate eggs and grits for breakfast, can't determine what else he eats during the day.  Does not sound like that he drinks oral nutrition supplements at home.    Medications: reviewed  Labs: reviewed  Anthropometrics:   Weight 118 lb 1.6 oz decreased from 119.1 pounds on July 3rd.   UBW 134 lb in January   NUTRITION DIAGNOSIS: severe malnutrition continues    INTERVENTION:  Discussed foods that are high in protein and calories. Patient not able to verbalize understanding. Provided coupons for patient to give to sister.  Recommend for patient to drink shakes to improve nutrition. RD called sister and left message on mobile number to find out more about patient's nutrition.  Asked sister to call RD back.     MONITORING, EVALUATION, GOAL: Patient will increase oral intake to minimize weight loss   NEXT VISIT: as needed  Yesly Gerety B. Zenia Resides, [place], [place] Registered Dietitian [phone] (pager)

## 2017-10-22 NOTE — Progress Notes (Signed)
Per Dr. Worthy Flank note: "I recommended for him to proceed with cycle #5 today as scheduled."

## 2017-10-22 NOTE — Patient Instructions (Signed)
TAKE OFF THE ONPRO (LEFT ARM) AT 9:00PM Wednesday, 10/23/17! Have a great week.   one [facility] Discharge Instructions for Patients Receiving Chemotherapy  Today you received the following chemotherapy agents Cyramza,Taxotere, Neulasta onpro To help prevent nausea and vomiting after your treatment, we encourage you to take your nausea medication as directed   If you develop nausea and vomiting that is not controlled by your nausea medication, call the clinic.   BELOW ARE SYMPTOMS THAT SHOULD BE REPORTED IMMEDIATELY:  *FEVER GREATER THAN 100.5 F  *CHILLS WITH OR WITHOUT FEVER  NAUSEA AND VOMITING THAT IS NOT CONTROLLED WITH YOUR NAUSEA MEDICATION  *UNUSUAL SHORTNESS OF BREATH  *UNUSUAL BRUISING OR BLEEDING  TENDERNESS IN MOUTH AND THROAT WITH OR WITHOUT PRESENCE OF ULCERS  *URINARY PROBLEMS  *BOWEL PROBLEMS  UNUSUAL RASH Items with * indicate a potential emergency and should be followed up as soon as possible.  Feel free to call the clinic should you have any questions or concerns. The clinic phone number is [phone].  Please show the [place] at check-in to the Emergency Department and triage nurse.

## 2017-10-23 ENCOUNTER — Telehealth: Payer: Self-pay

## 2017-10-23 NOTE — Telephone Encounter (Signed)
Nutrition Follow-up:  Sister returned call.    Sister reports patient eats all the time.  Reports that typically will just eat 2 meals per day.  Mostly eggs, grits in the morning and maybe some meat and vegetable in the evening.  Sister reports patient has been avoiding meat lately and she is not sure why.  Has not had any oral nutrition supplements at home but sister has been thinking about getting him some.    Intervention: Discussed with sister ways to increase calories and protein.  Agreeable to having RD mail handout.   Encouraged sister to purchase ensure/boost plus shakes for patient (2-3 per day) Discussed additional sources of protein besides meat.   Antonisha Waskey B. Zenia Resides, [place], [place] Registered Dietitian [phone] (pager)

## 2017-10-30 ENCOUNTER — Other Ambulatory Visit: Payer: Medicare Other

## 2017-11-01 ENCOUNTER — Telehealth: Payer: Self-pay | Admitting: *Deleted

## 2017-11-01 ENCOUNTER — Encounter: Payer: Self-pay | Admitting: *Deleted

## 2017-11-01 NOTE — Telephone Encounter (Signed)
Call received from patient's sister, Carmine Savoy in reference to "Need the date Tadhg was diagnosed with cancer.  Trying to obtain insurance but he needs to have been diagnosed at least two years ago".    Provided diagnosis date of "February 2018" per [facility] Progress notes.  Denies further questions or needs at this time.  Reviewed [place] forms guidelines for any future needs.

## 2017-11-06 ENCOUNTER — Telehealth: Payer: Self-pay | Admitting: Internal Medicine

## 2017-11-06 ENCOUNTER — Telehealth: Payer: Self-pay | Admitting: Medical Oncology

## 2017-11-06 ENCOUNTER — Other Ambulatory Visit: Payer: Medicare Other

## 2017-11-06 NOTE — Telephone Encounter (Signed)
Cancel appt today-Pt does not want to come in today for labs. No reason given. I confirmed his next appt and told [name] to call us if he starts to feel bad or any new symptoms.  Jury duty letter- [name] asked me to mail it to her -Done.

## 2017-11-06 NOTE — Telephone Encounter (Signed)
Returned call to patient sister re cancelling lab today. Returned call to reschedule weekly lab. Per sister she already spoke with nurse - they are not going to reschedule and will keep scheduled appointment for 9/4.

## 2017-11-07 ENCOUNTER — Telehealth: Payer: Self-pay | Admitting: Cardiology

## 2017-11-07 ENCOUNTER — Telehealth: Payer: Self-pay | Admitting: Internal Medicine

## 2017-11-07 ENCOUNTER — Other Ambulatory Visit: Payer: Self-pay | Admitting: *Deleted

## 2017-11-07 DIAGNOSIS — E876 Hypokalemia: Secondary | ICD-10-CM

## 2017-11-07 MED ORDER — ROSUVASTATIN CALCIUM 40 MG PO TABS: 40.0000 mg | ORAL_TABLET | Freq: Every day | ORAL | 0 refills | Status: DC

## 2017-11-07 NOTE — Telephone Encounter (Signed)
Copied from [place] [id number]. Topic: Quick Communication - Rx Refill/Question >> Nov 07, 2017 10:02 AM Antonieta Iba C wrote: Medication: rosuvastatin (CRESTOR) 40 MG tablet   Has the patient contacted their pharmacy?   (Agent: If no, request that the patient contact the pharmacy for the refill.)  (Agent: If yes, when and what did the pharmacy advise?)  Preferred Pharmacy (with phone number or street name): [id number] [address], [place] [phone] (Phone) [phone] (Fax)    Agent: Please be advised that RX refills may take up to 3 business days. We ask that you follow-up with your pharmacy.

## 2017-11-07 NOTE — Telephone Encounter (Signed)
Copied from [place] [id number]. Topic: Quick Communication - See Telephone Encounter >> Nov 07, 2017 10:06 AM Antonieta Iba C wrote: CRM for notification. See Telephone encounter for: 11/07/17.  Pt would like to know if he is suppose to still be taking potassium medication? Pt said if so he needs a refill.   Pharmacy: [id number] [address], [place] [phone] (Phone) [phone] (Fax)

## 2017-11-13 ENCOUNTER — Telehealth: Payer: Self-pay | Admitting: Internal Medicine

## 2017-11-13 ENCOUNTER — Inpatient Hospital Stay (HOSPITAL_BASED_OUTPATIENT_CLINIC_OR_DEPARTMENT_OTHER): Payer: Medicare Other | Admitting: Oncology

## 2017-11-13 ENCOUNTER — Inpatient Hospital Stay: Payer: Medicare Other

## 2017-11-13 ENCOUNTER — Inpatient Hospital Stay: Payer: Medicare Other | Attending: Internal Medicine

## 2017-11-13 ENCOUNTER — Encounter: Payer: Self-pay | Admitting: Oncology

## 2017-11-13 VITALS — BP 139/87 | HR 69 | Temp 98.0°F | Resp 21 | Ht 75.0 in | Wt 118.5 lb

## 2017-11-13 VITALS — BP 139/87 | HR 80

## 2017-11-13 DIAGNOSIS — Z5111 Encounter for antineoplastic chemotherapy: Secondary | ICD-10-CM | POA: Diagnosis not present

## 2017-11-13 DIAGNOSIS — C3492 Malignant neoplasm of unspecified part of left bronchus or lung: Secondary | ICD-10-CM

## 2017-11-13 DIAGNOSIS — Z9221 Personal history of antineoplastic chemotherapy: Secondary | ICD-10-CM | POA: Diagnosis not present

## 2017-11-13 DIAGNOSIS — C3412 Malignant neoplasm of upper lobe, left bronchus or lung: Secondary | ICD-10-CM | POA: Insufficient documentation

## 2017-11-13 DIAGNOSIS — Z5189 Encounter for other specified aftercare: Secondary | ICD-10-CM | POA: Diagnosis not present

## 2017-11-13 DIAGNOSIS — Z5112 Encounter for antineoplastic immunotherapy: Secondary | ICD-10-CM | POA: Insufficient documentation

## 2017-11-13 DIAGNOSIS — Z923 Personal history of irradiation: Secondary | ICD-10-CM | POA: Insufficient documentation

## 2017-11-13 DIAGNOSIS — Z95828 Presence of other vascular implants and grafts: Secondary | ICD-10-CM

## 2017-11-13 LAB — CMP (CANCER CENTER ONLY)
ALBUMIN: 3.3 g/dL — AB (ref 3.5–5.0)
ALT: 12 U/L (ref 0–44)
AST: 20 U/L (ref 15–41)
Alkaline Phosphatase: 90 U/L (ref 38–126)
Anion gap: 9 (ref 5–15)
BUN: 16 mg/dL (ref 8–23)
CHLORIDE: 106 mmol/L (ref 98–111)
CO2: 25 mmol/L (ref 22–32)
CREATININE: 0.83 mg/dL (ref 0.61–1.24)
Calcium: 9.9 mg/dL (ref 8.9–10.3)
GFR, Est AFR Am: >60 mL/min (ref >60)
GFR, Estimated: >60 mL/min (ref >60)
GLUCOSE: 103 mg/dL — AB (ref 70–99)
Potassium: 4 mmol/L (ref 3.5–5.1)
SODIUM: 140 mmol/L (ref 135–145)
Total Bilirubin: <0.2 mg/dL — ABNORMAL LOW (ref 0.3–1.2)
Total Protein: 7.2 g/dL (ref 6.5–8.1)

## 2017-11-13 LAB — CBC WITH DIFFERENTIAL (CANCER CENTER ONLY)
Basophils Absolute: 0 10*3/uL (ref 0.0–0.1)
Basophils Relative: 1 %
EOS PCT: 0 %
Eosinophils Absolute: 0 10*3/uL (ref 0.0–0.5)
HCT: 35.3 % — ABNORMAL LOW (ref 38.4–49.9)
Hemoglobin: 11.1 g/dL — ABNORMAL LOW (ref 13.0–17.1)
LYMPHS ABS: 1.1 10*3/uL (ref 0.9–3.3)
LYMPHS PCT: 17 %
MCH: 29.4 pg (ref 27.2–33.4)
MCHC: 31.4 g/dL — AB (ref 32.0–36.0)
MCV: 93.4 fL (ref 79.3–98.0)
MONO ABS: 1 10*3/uL — AB (ref 0.1–0.9)
MONOS PCT: 15 %
Neutro Abs: 4.4 10*3/uL (ref 1.5–6.5)
Neutrophils Relative %: 67 %
PLATELETS: 239 10*3/uL (ref 140–400)
RBC: 3.78 MIL/uL — ABNORMAL LOW (ref 4.20–5.82)
RDW: 18.7 % — ABNORMAL HIGH (ref 11.0–14.6)
WBC: 6.5 10*3/uL (ref 4.0–10.3)

## 2017-11-13 LAB — TOTAL PROTEIN, URINE DIPSTICK: Protein, ur: NEGATIVE mg/dL

## 2017-11-13 MED ORDER — ACETAMINOPHEN 325 MG PO TABS
ORAL_TABLET | ORAL | Status: AC
  Filled 2017-11-13: qty 2

## 2017-11-13 MED ORDER — SODIUM CHLORIDE 0.9 % IV SOLN
10.0000 mg/kg | Freq: Once | INTRAVENOUS | Status: AC
  Administered 2017-11-13: 600 mg via INTRAVENOUS
  Filled 2017-11-13: qty 50

## 2017-11-13 MED ORDER — SODIUM CHLORIDE 0.9 % IV SOLN
75.0000 mg/m2 | Freq: Once | INTRAVENOUS | Status: AC
  Administered 2017-11-13: 130 mg via INTRAVENOUS
  Filled 2017-11-13: qty 13

## 2017-11-13 MED ORDER — HEPARIN SOD (PORK) LOCK FLUSH 100 UNIT/ML IV SOLN
500.0000 [IU] | Freq: Once | INTRAVENOUS | Status: AC | PRN
  Administered 2017-11-13: 500 [IU]
  Filled 2017-11-13: qty 5

## 2017-11-13 MED ORDER — DIPHENHYDRAMINE HCL 50 MG/ML IJ SOLN
50.0000 mg | Freq: Once | INTRAMUSCULAR | Status: AC
  Administered 2017-11-13: 50 mg via INTRAVENOUS

## 2017-11-13 MED ORDER — ACETAMINOPHEN 325 MG PO TABS
650.0000 mg | ORAL_TABLET | Freq: Once | ORAL | Status: AC
  Administered 2017-11-13: 650 mg via ORAL

## 2017-11-13 MED ORDER — DIPHENHYDRAMINE HCL 50 MG/ML IJ SOLN
INTRAMUSCULAR | Status: AC
  Filled 2017-11-13: qty 1

## 2017-11-13 MED ORDER — SODIUM CHLORIDE 0.9 % IV SOLN
Freq: Once | INTRAVENOUS | Status: AC
  Administered 2017-11-13: 12:00:00 via INTRAVENOUS
  Filled 2017-11-13: qty 250

## 2017-11-13 MED ORDER — SODIUM CHLORIDE 0.9% FLUSH
10.0000 mL | INTRAVENOUS | Status: DC | PRN
  Administered 2017-11-13: 10 mL
  Filled 2017-11-13: qty 10

## 2017-11-13 MED ORDER — PEGFILGRASTIM 6 MG/0.6ML ~~LOC~~ PSKT
PREFILLED_SYRINGE | SUBCUTANEOUS | Status: AC
  Filled 2017-11-13: qty 0.6

## 2017-11-13 MED ORDER — PEGFILGRASTIM 6 MG/0.6ML ~~LOC~~ PSKT
6.0000 mg | PREFILLED_SYRINGE | Freq: Once | SUBCUTANEOUS | Status: AC
  Administered 2017-11-13: 6 mg via SUBCUTANEOUS

## 2017-11-13 MED ORDER — DEXAMETHASONE SODIUM PHOSPHATE 10 MG/ML IJ SOLN
10.0000 mg | Freq: Once | INTRAMUSCULAR | Status: AC
  Administered 2017-11-13: 10 mg via INTRAVENOUS

## 2017-11-13 MED ORDER — DEXAMETHASONE SODIUM PHOSPHATE 10 MG/ML IJ SOLN
INTRAMUSCULAR | Status: AC
  Filled 2017-11-13: qty 1

## 2017-11-13 NOTE — Progress Notes (Signed)
[facility] OFFICE PROGRESS NOTE  Charolette Forward, MD [place] [address]  DIAGNOSIS:Stage IIIa (T2b, N2, M0) non-small cell lung cancer, squamous cell carcinoma presented with left upper lobe lung mass in addition to AP window lymphadenopathy diagnosed in February 2018.  PRIOR THERAPY: 1) Concurrent chemoradiation therapy. He received radiation for approximately 2-3 weeks prior to starting his chemotherapy. He completed his radiation on 07/05/2016. He received 2 doses of weekly carboplatin for an AUC of 2 and paclitaxel 45 mg meter squared which was last given on 07/03/2016. 2) consolidation treatment with immunotherapy with Imfinzi (Durvalumab) 10 MG/KG every 2 weeks. First dose 08/22/2016. Status post 2 cycles.  Last dose was given September 19, 2016 discontinued secondary to intolerance and significant skin rash. 3)Systemic chemotherapy with carboplatin for AUC of 5 and paclitaxel 175 mg/M2 every 3 weeks. First dose February 06, 2017.Status post6cycles.  CURRENT THERAPY: Docetaxel 75 mg/m with Cyramza 10 mg/kg given every 3 weeks. First dose 08/02/2017.Status post 5 cycles.  INTERVAL HISTORY: [name] 67 y.o. male returns for routine follow-up visit by himself.  The patient is feeling fine today and has no specific complaints.  He denies fevers and chills.  Denies chest pain, shortness of breath, cough, hemoptysis.  Denies nausea, vomiting, constipation, diarrhea.  Denies recent weight loss or night sweats.  He is here for evaluation prior to starting cycle #6 of his treatment.  MEDICAL HISTORY: Past Medical History:  Diagnosis Date  . AAA (abdominal aortic aneurysm) ([place])   . Abdominal aortic aneurysm ([place])    AAA And bilateral common iliac artery aneurysms  . BPH (benign prostatic hyperplasia)   . CAD (coronary artery disease)    Dr Ron Parker @ [facility]   . Chronic mental illness 04/21/2011   Diagnosis unclear,  Family provides  good care  . COPD (chronic obstructive pulmonary disease) ([place]) 04/21/2011   smoking  . Drug-induced skin rash 09/05/2016  . Ejection fraction    EF 35-40%, echo, May, 2010  . Hyperlipidemia   . Hyperplastic colon polyp 04/21/2011  . Hypertension   . Preop cardiovascular exam    Cardiac clearance for major vascular surgery, May, 2013  . Pulmonary nodule    Chest CT May, 2013, 2 small pulmonary nodules, needs appropriate followup,  this CT was not ordered by the cardiology team  . PVD (peripheral vascular disease) ([place]) 04/21/2011  . Retention of urine   . Shortness of breath   . Squamous cell lung cancer, left ([place]) 06/13/2016  . Tobacco abuse     ALLERGIES:  is allergic to ace inhibitors.  MEDICATIONS:  Current Outpatient Medications  Medication Sig Dispense Refill  . albuterol (PROVENTIL HFA;VENTOLIN HFA) 108 (90 Base) MCG/ACT inhaler Inhale 1-2 puffs into the lungs every 6 (six) hours as needed for wheezing or shortness of breath. (Patient not taking: Reported on 10/02/2017) 1 Inhaler 0  . dexamethasone (DECADRON) 4 MG tablet Take 2 tabs twice a day the day before, day of, and day after each cycle of chemo 20 tablet 1  . ondansetron (ZOFRAN ODT) 4 MG disintegrating tablet Take 1 tablet (4 mg total) by mouth every 8 (eight) hours as needed for nausea or vomiting. (Patient not taking: Reported on 09/18/2017) 10 tablet 0  . potassium chloride (K-DUR) 10 MEQ tablet Take 1 tablet (10 mEq total) by mouth daily. (Patient not taking: Reported on 11/13/2017) 7 tablet 0  . prochlorperazine (COMPAZINE) 10 MG tablet Take 1 tablet (10 mg  total) by mouth every 6 (six) hours as needed for nausea or vomiting. (Patient not taking: Reported on 09/18/2017) 30 tablet 0  . psyllium (METAMUCIL) 58.6 % packet Take 1 packet by mouth daily. (Patient not taking: Reported on 11/13/2017) 30 each 12  . rosuvastatin (CRESTOR) 40 MG tablet Take 1 tablet (40 mg total) by mouth daily. (Patient not taking: Reported on 11/13/2017) 60  tablet 0  . sucralfate (CARAFATE) 1 GM/10ML suspension Take 10 mLs (1 g total) by mouth 4 (four) times daily -  with meals and at bedtime. (Patient not taking: Reported on 09/18/2017) 420 mL 0   No current facility-administered medications for this visit.    Facility-Administered Medications Ordered in Other Visits  Medication Dose Route Frequency Provider Last Rate Last Dose  . DOCEtaxel (TAXOTERE) 130 mg in sodium chloride 0.9 % 250 mL chemo infusion  75 mg/m2 (Treatment Plan Recorded) Intravenous Once Curt Bears, MD      . heparin lock flush 100 unit/mL  500 Units Intracatheter Once PRN Curt Bears, MD      . pegfilgrastim (NEULASTA ONPRO KIT) injection 6 mg  6 mg Subcutaneous Once Curt Bears, MD      . ramucirumab [facility]) 600 mg in sodium chloride 0.9 % 190 mL chemo infusion  10 mg/kg (Treatment Plan Recorded) Intravenous Once Curt Bears, MD      . sodium chloride flush (NS) 0.9 % injection 10 mL  10 mL Intracatheter PRN Curt Bears, MD        SURGICAL HISTORY:  Past Surgical History:  Procedure Laterality Date  . ABDOMINAL AORTA STENT  07/2011  . ABDOMINAL AORTAGRAM N/A 07/26/2011   Procedure: ABDOMINAL Maxcine Ham;  Surgeon: Conrad Taylor, MD;  Location: [facility] CATH LAB;  Service: Cardiovascular;  Laterality: N/A;  . ABDOMINAL AORTIC ANEURYSM REPAIR  07/2011  . CORONARY ARTERY BYPASS GRAFT  07/2008   CABG X5/notes 07/12/2010  . EMBOLIZATION Left 07/26/2011   Procedure: EMBOLIZATION;  Surgeon: Conrad Oktaha, MD;  Location: [facility] CATH LAB;  Service: Cardiovascular;  Laterality: Left;  . IR IMAGING GUIDED PORT INSERTION  09/05/2017  . TRANSURETHRAL RESECTION OF PROSTATE  11/29/2011   Procedure: TRANSURETHRAL RESECTION OF THE PROSTATE WITH GYRUS INSTRUMENTS;  Surgeon: Malka So, MD;  Location: [facility] ORS;  Service: Urology;  Laterality: N/A;  Prostate Ultrasound, and Biopsy  . VIDEO BRONCHOSCOPY Bilateral 04/12/2016   Procedure: VIDEO BRONCHOSCOPY WITH FLUORO;  Surgeon: Juanito Doom, MD;  Location: [facility];  Service: Cardiopulmonary;  Laterality: Bilateral;    REVIEW OF SYSTEMS:   Review of Systems  Constitutional: Negative for appetite change, chills, fatigue, fever and unexpected weight change.  HENT:   Negative for mouth sores, nosebleeds, sore throat and trouble swallowing.   Eyes: Negative for eye problems and icterus.  Respiratory: Negative for cough, hemoptysis, shortness of breath and wheezing.   Cardiovascular: Negative for chest pain and leg swelling.  Gastrointestinal: Negative for abdominal pain, constipation, diarrhea, nausea and vomiting.  Genitourinary: Negative for bladder incontinence, difficulty urinating, dysuria, frequency and hematuria.   Musculoskeletal: Negative for back pain, gait problem, neck pain and neck stiffness.  Skin: Negative for itching and rash.  Neurological: Negative for dizziness, extremity weakness, gait problem, headaches, light-headedness and seizures.  Hematological: Negative for adenopathy. Does not bruise/bleed easily.  Psychiatric/Behavioral: Negative for confusion, depression and sleep disturbance. The patient is not nervous/anxious.     PHYSICAL EXAMINATION:  Blood pressure 139/87, pulse 69, temperature 98 F (36.7 C), temperature source Oral, resp.  rate (!) 21, height '6\' 3"'$  (1.905 m), weight 118 lb 8 oz (53.8 kg), SpO2 99 %.  ECOG PERFORMANCE STATUS: 1 - Symptomatic but completely ambulatory  Physical Exam  Constitutional: Oriented to person, place, and time and well-developed, well-nourished, and in no distress. No distress.  HENT:  Head: Normocephalic and atraumatic.  Mouth/Throat: Oropharynx is clear and moist. No oropharyngeal exudate.  Eyes: Conjunctivae are normal. Right eye exhibits no discharge. Left eye exhibits no discharge. No scleral icterus.  Neck: Normal range of motion. Neck supple.  Cardiovascular: Normal rate, regular rhythm, normal heart sounds and intact distal pulses.    Pulmonary/Chest: Effort normal and breath sounds normal. No respiratory distress. No wheezes. No rales.  Abdominal: Soft. Bowel sounds are normal. Exhibits no distension and no mass. There is no tenderness.  Musculoskeletal: Normal range of motion. Exhibits no edema.  Lymphadenopathy:    No cervical adenopathy.  Neurological: Alert and oriented to person, place, and time. Exhibits normal muscle tone. Gait normal. Coordination normal.  Skin: Skin is warm and dry. No rash noted. Not diaphoretic. No erythema. No pallor.  Psychiatric: Mood, memory and judgment normal.  Vitals reviewed.  LABORATORY DATA: Lab Results  Component Value Date   WBC 6.5 11/13/2017   HGB 11.1 (L) 11/13/2017   HCT 35.3 (L) 11/13/2017   MCV 93.4 11/13/2017   PLT 239 11/13/2017      Chemistry      Component Value Date/Time   NA 140 11/13/2017 0848   NA 139 03/13/2017 1059   K 4.0 11/13/2017 0848   K 3.9 03/13/2017 1059   CL 106 11/13/2017 0848   CO2 25 11/13/2017 0848   CO2 26 03/13/2017 1059   BUN 16 11/13/2017 0848   BUN 18.6 03/13/2017 1059   CREATININE 0.83 11/13/2017 0848   CREATININE 1.2 03/13/2017 1059      Component Value Date/Time   CALCIUM 9.9 11/13/2017 0848   CALCIUM 8.9 03/13/2017 1059   ALKPHOS 90 11/13/2017 0848   ALKPHOS 118 03/13/2017 1059   AST 20 11/13/2017 0848   AST 16 03/13/2017 1059   ALT 12 11/13/2017 0848   ALT 14 03/13/2017 1059   BILITOT <0.2 (L) 11/13/2017 0848   BILITOT 0.36 03/13/2017 1059       RADIOGRAPHIC STUDIES:  No results found.   ASSESSMENT/PLAN:  Stage IV squamous cell carcinoma of left lung ([place]) This is a very pleasant 67 year old African-American male with unresectable a stage IIIa non-small cell lung cancer, status post a course of concurrent chemoradiation with weekly carboplatin and paclitaxel and tolerated the treatment well. His scan showed no evidence for disease progression and the patient was started on consolidation treatment with  immunotherapy with Imfinzi (Durvalumab) status post 2 cycles.  This treatment was discontinued secondary to a significant skin rash treated with steroids with recurrence after the patient was resumed again. The patient was found to have disease progression and he was treated with systemic chemotherapy with carboplatin and paclitaxel for 6 cycles discontinued secondary to disease progression. He is currently undergoing systemic chemotherapy with second line chemotherapy with docetaxel and Cyramza status post 5 cycles.  He has been tolerating this treatment well with no concerning complaints except for fatigue. I recommended for him to proceed with cycle #6 today as scheduled. He will have a restaging CT scan of the chest prior to his next visit.  He will follow-up in 3 weeks for evaluation prior to cycle #7 of his treatment and to review his  restaging CT scan results.  He was advised to call immediately if he has any concerning symptoms in the interval. The patient voices understanding of current disease status and treatment options and is in agreement with the current care plan. All questions were answered. The patient knows to call the clinic with any problems, questions or concerns. We can certainly see the patient much sooner if necessary.   Orders Placed This Encounter  Procedures  . CT CHEST W CONTRAST    Standing Status:   Future    Standing Expiration Date:   11/14/2018    Order Specific Question:   If indicated for the ordered procedure, I authorize the administration of contrast media per Radiology protocol    Answer:   Yes    Order Specific Question:   Preferred imaging location?    Answer:   [facility]    Order Specific Question:   Radiology Contrast Protocol - do NOT remove file path    Answer:   [email or web address]    Order Specific Question:   ** REASON FOR EXAM (FREE TEXT)    Answer:   Lung cancer. Restaging.  . Total Protein, Urine dipstick      Mikey Bussing, DNP, AGPCNP-BC, AOCNP 11/13/17

## 2017-11-13 NOTE — Assessment & Plan Note (Addendum)
This is a very pleasant 67 year old African-American male with unresectable a stage IIIa non-small cell lung cancer, status post a course of concurrent chemoradiation with weekly carboplatin and paclitaxel and tolerated the treatment well. His scan showed no evidence for disease progression and the patient was started on consolidation treatment with immunotherapy with Imfinzi (Durvalumab) status post 2 cycles.  This treatment was discontinued secondary to a significant skin rash treated with steroids with recurrence after the patient was resumed again. The patient was found to have disease progression and he was treated with systemic chemotherapy with carboplatin and paclitaxel for 6 cycles discontinued secondary to disease progression. He is currently undergoing systemic chemotherapy with second line chemotherapy with docetaxel and Cyramza status post 5 cycles.  He has been tolerating this treatment well with no concerning complaints except for fatigue. I recommended for him to proceed with cycle #6 today as scheduled. He will have a restaging CT scan of the chest prior to his next visit.  He will follow-up in 3 weeks for evaluation prior to cycle #7 of his treatment and to review his restaging CT scan results.  He was advised to call immediately if he has any concerning symptoms in the interval. The patient voices understanding of current disease status and treatment options and is in agreement with the current care plan. All questions were answered. The patient knows to call the clinic with any problems, questions or concerns. We can certainly see the patient much sooner if necessary.

## 2017-11-13 NOTE — Patient Instructions (Signed)
[facility] Discharge Instructions for Patients Receiving Chemotherapy  Today you received the following chemotherapy agents Cyramza and Taxotere  To help prevent nausea and vomiting after your treatment, we encourage you to take your nausea medication as prescribed.   If you develop nausea and vomiting that is not controlled by your nausea medication, call the clinic.   BELOW ARE SYMPTOMS THAT SHOULD BE REPORTED IMMEDIATELY:  *FEVER GREATER THAN 100.5 F  *CHILLS WITH OR WITHOUT FEVER  NAUSEA AND VOMITING THAT IS NOT CONTROLLED WITH YOUR NAUSEA MEDICATION  *UNUSUAL SHORTNESS OF BREATH  *UNUSUAL BRUISING OR BLEEDING  TENDERNESS IN MOUTH AND THROAT WITH OR WITHOUT PRESENCE OF ULCERS  *URINARY PROBLEMS  *BOWEL PROBLEMS  UNUSUAL RASH Items with * indicate a potential emergency and should be followed up as soon as possible.  Feel free to call the clinic should you have any questions or concerns. The clinic phone number is [phone].  Please show the [facility] at check-in to the Emergency Department and triage nurse.   Docetaxel injection (Taxotere) What is this medicine? DOCETAXEL (doe se TAX el) is a chemotherapy drug. It targets fast dividing cells, like cancer cells, and causes these cells to die. This medicine is used to treat many types of cancers like breast cancer, certain stomach cancers, head and neck cancer, lung cancer, and prostate cancer. This medicine may be used for other purposes; ask your health care provider or pharmacist if you have questions. COMMON BRAND NAME(S): Docefrez, Taxotere What should I tell my health care provider before I take this medicine? They need to know if you have any of these conditions: -infection (especially a virus infection such as chickenpox, cold sores, or herpes) -liver disease -low blood counts, like low white cell, platelet, or red cell counts -an unusual or allergic reaction to docetaxel, polysorbate  80, other chemotherapy agents, other medicines, foods, dyes, or preservatives -pregnant or trying to get pregnant -breast-feeding How should I use this medicine? This drug is given as an infusion into a vein. It is administered in a hospital or clinic by a specially trained health care professional. Talk to your pediatrician regarding the use of this medicine in children. Special care may be needed. Overdosage: If you think you have taken too much of this medicine contact a poison control center or emergency room at once. NOTE: This medicine is only for you. Do not share this medicine with others. What if I miss a dose? It is important not to miss your dose. Call your doctor or health care professional if you are unable to keep an appointment. What may interact with this medicine? -cyclosporine -erythromycin -ketoconazole -medicines to increase blood counts like filgrastim, pegfilgrastim, sargramostim -vaccines Talk to your doctor or health care professional before taking any of these medicines: -acetaminophen -aspirin -ibuprofen -ketoprofen -naproxen This list may not describe all possible interactions. Give your health care provider a list of all the medicines, herbs, non-prescription drugs, or dietary supplements you use. Also tell them if you smoke, drink alcohol, or use illegal drugs. Some items may interact with your medicine. What should I watch for while using this medicine? Your condition will be monitored carefully while you are receiving this medicine. You will need important blood work done while you are taking this medicine. This drug may make you feel generally unwell. This is not uncommon, as chemotherapy can affect healthy cells as well as cancer cells. Report any side effects. Continue your course of treatment even though  you feel ill unless your doctor tells you to stop. In some cases, you may be given additional medicines to help with side effects. Follow all directions  for their use. Call your doctor or health care professional for advice if you get a fever, chills or sore throat, or other symptoms of a cold or flu. Do not treat yourself. This drug decreases your body's ability to fight infections. Try to avoid being around people who are sick. This medicine may increase your risk to bruise or bleed. Call your doctor or health care professional if you notice any unusual bleeding. This medicine may contain alcohol in the product. You may get drowsy or dizzy. Do not drive, use machinery, or do anything that needs mental alertness until you know how this medicine affects you. Do not stand or sit up quickly, especially if you are an older patient. This reduces the risk of dizzy or fainting spells. Avoid alcoholic drinks. Do not become pregnant while taking this medicine. Women should inform their doctor if they wish to become pregnant or think they might be pregnant. There is a potential for serious side effects to an unborn child. Talk to your health care professional or pharmacist for more information. Do not breast-feed an infant while taking this medicine. What side effects may I notice from receiving this medicine? Side effects that you should report to your doctor or health care professional as soon as possible: -allergic reactions like skin rash, itching or hives, swelling of the face, lips, or tongue -low blood counts - This drug may decrease the number of white blood cells, red blood cells and platelets. You may be at increased risk for infections and bleeding. -signs of infection - fever or chills, cough, sore throat, pain or difficulty passing urine -signs of decreased platelets or bleeding - bruising, pinpoint red spots on the skin, black, tarry stools, nosebleeds -signs of decreased red blood cells - unusually weak or tired, fainting spells, lightheadedness -breathing problems -fast or irregular heartbeat -low blood pressure -mouth sores -nausea and  vomiting -pain, swelling, redness or irritation at the injection site -pain, tingling, numbness in the hands or feet -swelling of the ankle, feet, hands -weight gain Side effects that usually do not require medical attention (report to your doctor or health care professional if they continue or are bothersome): -bone pain -complete hair loss including hair on your head, underarms, pubic hair, eyebrows, and eyelashes -diarrhea -excessive tearing -changes in the color of fingernails -loosening of the fingernails -nausea -muscle pain -red flush to skin -sweating -weak or tired This list may not describe all possible side effects. Call your doctor for medical advice about side effects. You may report side effects to FDA at 1-800-FDA-1088. Where should I keep my medicine? This drug is given in a hospital or clinic and will not be stored at home. NOTE: This sheet is a summary. It may not cover all possible information. If you have questions about this medicine, talk to your doctor, pharmacist, or health care provider.  2018 Elsevier/Gold Standard (2015-03-31 12:32:56)  Ramucirumab injection (Cyramza) What is this medicine? RAMUCIRUMAB (ra mue SIR ue mab) is a monoclonal antibody. It is used to treat stomach cancer, colorectal cancer, or lung cancer. This medicine may be used for other purposes; ask your health care provider or pharmacist if you have questions. COMMON BRAND NAME(S): Cyramza What should I tell my health care provider before I take this medicine? They need to know if you have any of these  conditions: -bleeding disorders -blood clots -heart disease, including heart failure, heart attack, or chest pain (angina) -high blood pressure -infection (especially a virus infection such as chickenpox, cold sores, or herpes) -protein in your urine -recent surgery -stroke -an unusual or allergic reaction to ramucirumab, other medicines, foods, dyes, or preservatives -pregnant or  trying to get pregnant -breast-feeding How should I use this medicine? This medicine is for infusion into a vein. It is given by a health care professional in a hospital or clinic setting. Talk to your pediatrician regarding the use of this medicine in children. Special care may be needed. Overdosage: If you think you have taken too much of this medicine contact a poison control center or emergency room at once. NOTE: This medicine is only for you. Do not share this medicine with others. What if I miss a dose? It is important not to miss your dose. Call your doctor or health care professional if you are unable to keep an appointment. What may interact with this medicine? Interactions have not been studied. This list may not describe all possible interactions. Give your health care provider a list of all the medicines, herbs, non-prescription drugs, or dietary supplements you use. Also tell them if you smoke, drink alcohol, or use illegal drugs. Some items may interact with your medicine. What should I watch for while using this medicine? Your condition will be monitored carefully while you are receiving this medicine. You will need to to check your blood pressure and have your blood and urine tested while you are taking this medicine. Your condition will be monitored carefully while you are receiving this medicine. This medicine may increase your risk to bruise or bleed. Call your doctor or health care professional if you notice any unusual bleeding. This medicine may rarely cause 'gastrointestinal perforation' (holes in the stomach, intestines or colon), a serious side effect requiring surgery to repair. This medicine should be started at least 28 days following major surgery and the site of the surgery should be totally healed. Check with your doctor before scheduling dental work or surgery while you are receiving this treatment. Talk to your doctor if you have recently had surgery or if you have  a wound that has not healed. Do not become pregnant while taking this medicine or for 3 months after stopping it. Women should inform their doctor if they wish to become pregnant or think they might be pregnant. There is a potential for serious side effects to an unborn child. Talk to your health care professional or pharmacist for more information. What side effects may I notice from receiving this medicine? Side effects that you should report to your doctor or health care professional as soon as possible: -allergic reactions like skin rash, itching or hives, breathing problems, swelling of the face, lips, or tongue -signs of infection - fever or chills, cough, sore throat -chest pain or chest tightness -confusion -dizziness -feeling faint or lightheaded, falls -severe abdominal pain -severe nausea, vomiting -signs and symptoms of bleeding such as bloody or black, tarry stools; red or dark-brown urine; spitting up blood or brown material that looks like coffee grounds; red spots on the skin; unusual bruising or bleeding from the eye, gums, or nose -signs and symptoms of a blood clot such as breathing problems; changes in vision; chest pain; severe, sudden headache; pain, swelling, warmth in the leg; trouble speaking; sudden numbness or weakness of the face, arm or leg -symptoms of a stroke: change in mental awareness, inability  to talk or move one side of the body -trouble walking, dizziness, loss of balance or coordination Side effects that usually do not require medical attention (report to your doctor or health care professional if they continue or are bothersome): -cold, clammy skin -constipation -diarrhea -headache -nausea, vomiting -stomach pain -unusually slow heartbeat -unusually weak or tired This list may not describe all possible side effects. Call your doctor for medical advice about side effects. You may report side effects to FDA at 1-800-FDA-1088. Where should I keep my  medicine? This drug is given in a hospital or clinic and will not be stored at home. NOTE: This sheet is a summary. It may not cover all possible information. If you have questions about this medicine, talk to your doctor, pharmacist, or health care provider.  2018 Elsevier/Gold Standard (2015-03-31 08:20:29)

## 2017-11-13 NOTE — Telephone Encounter (Signed)
Appts scheduled AVS/Calendar printed and patient was given phone number for Central scheduling for CT scan per 9/4 los

## 2017-11-19 ENCOUNTER — Ambulatory Visit: Payer: Medicare Other | Admitting: Internal Medicine

## 2017-11-19 ENCOUNTER — Inpatient Hospital Stay: Payer: Medicare Other

## 2017-11-19 ENCOUNTER — Telehealth: Payer: Self-pay | Admitting: Internal Medicine

## 2017-11-19 ENCOUNTER — Encounter: Payer: Self-pay | Admitting: *Deleted

## 2017-11-19 NOTE — Telephone Encounter (Signed)
Patient called to cancel °

## 2017-11-20 ENCOUNTER — Other Ambulatory Visit: Payer: Medicare Other

## 2017-11-25 ENCOUNTER — Encounter: Payer: Self-pay | Admitting: Medical Oncology

## 2017-11-25 ENCOUNTER — Telehealth: Payer: Self-pay | Admitting: Medical Oncology

## 2017-11-25 NOTE — Telephone Encounter (Signed)
[employer] duty letter never received, Mailed it again to pt address.

## 2017-11-26 ENCOUNTER — Inpatient Hospital Stay: Payer: Medicare Other

## 2017-11-26 DIAGNOSIS — Z5111 Encounter for antineoplastic chemotherapy: Secondary | ICD-10-CM | POA: Diagnosis not present

## 2017-11-26 DIAGNOSIS — Z9221 Personal history of antineoplastic chemotherapy: Secondary | ICD-10-CM | POA: Diagnosis not present

## 2017-11-26 DIAGNOSIS — Z95828 Presence of other vascular implants and grafts: Secondary | ICD-10-CM

## 2017-11-26 DIAGNOSIS — C3492 Malignant neoplasm of unspecified part of left bronchus or lung: Secondary | ICD-10-CM

## 2017-11-26 DIAGNOSIS — Z5112 Encounter for antineoplastic immunotherapy: Secondary | ICD-10-CM | POA: Diagnosis not present

## 2017-11-26 DIAGNOSIS — Z923 Personal history of irradiation: Secondary | ICD-10-CM | POA: Diagnosis not present

## 2017-11-26 DIAGNOSIS — Z5189 Encounter for other specified aftercare: Secondary | ICD-10-CM | POA: Diagnosis not present

## 2017-11-26 DIAGNOSIS — C3412 Malignant neoplasm of upper lobe, left bronchus or lung: Secondary | ICD-10-CM | POA: Diagnosis not present

## 2017-11-26 LAB — CMP (CANCER CENTER ONLY)
ALBUMIN: 2.9 g/dL — AB (ref 3.5–5.0)
ALK PHOS: 119 U/L (ref 38–126)
ALT: 8 U/L (ref 0–44)
AST: 16 U/L (ref 15–41)
Anion gap: 8 (ref 5–15)
BUN: 11 mg/dL (ref 8–23)
CHLORIDE: 106 mmol/L (ref 98–111)
CO2: 30 mmol/L (ref 22–32)
CREATININE: 0.83 mg/dL (ref 0.61–1.24)
Calcium: 9.1 mg/dL (ref 8.9–10.3)
GFR, Est AFR Am: >60 mL/min (ref >60)
GFR, Estimated: >60 mL/min (ref >60)
GLUCOSE: 101 mg/dL — AB (ref 70–99)
Potassium: 3.6 mmol/L (ref 3.5–5.1)
SODIUM: 144 mmol/L (ref 135–145)
Total Bilirubin: 0.3 mg/dL (ref 0.3–1.2)
Total Protein: 6.5 g/dL (ref 6.5–8.1)

## 2017-11-26 LAB — CBC WITH DIFFERENTIAL (CANCER CENTER ONLY)
BASOS ABS: 0 10*3/uL (ref 0.0–0.1)
Basophils Relative: 0 %
EOS ABS: 0 10*3/uL (ref 0.0–0.5)
Eosinophils Relative: 0 %
HCT: 35.4 % — ABNORMAL LOW (ref 38.4–49.9)
Hemoglobin: 11.1 g/dL — ABNORMAL LOW (ref 13.0–17.1)
LYMPHS ABS: 1 10*3/uL (ref 0.9–3.3)
Lymphocytes Relative: 7 %
MCH: 29.4 pg (ref 27.2–33.4)
MCHC: 31.4 g/dL — ABNORMAL LOW (ref 32.0–36.0)
MCV: 93.7 fL (ref 79.3–98.0)
MONO ABS: 0.8 10*3/uL (ref 0.1–0.9)
MONOS PCT: 6 %
NEUTROS ABS: 11.9 10*3/uL — AB (ref 1.5–6.5)
Neutrophils Relative %: 87 %
Platelet Count: 223 10*3/uL (ref 140–400)
RBC: 3.78 MIL/uL — AB (ref 4.20–5.82)
RDW: 18.5 % — AB (ref 11.0–14.6)
WBC Count: 13.7 10*3/uL — ABNORMAL HIGH (ref 4.0–10.3)

## 2017-11-26 MED ORDER — SODIUM CHLORIDE 0.9% FLUSH
10.0000 mL | INTRAVENOUS | Status: DC | PRN
  Administered 2017-11-26: 10 mL
  Filled 2017-11-26: qty 10

## 2017-11-27 ENCOUNTER — Other Ambulatory Visit: Payer: Medicare Other

## 2017-11-27 ENCOUNTER — Ambulatory Visit (HOSPITAL_COMMUNITY)
Admission: RE | Admit: 2017-11-27 | Discharge: 2017-11-27 | Disposition: A | Discharge status: Home / Self Care | Payer: Medicare Other | Source: Ambulatory Visit | Attending: Oncology | Admitting: Oncology

## 2017-11-27 ENCOUNTER — Encounter (HOSPITAL_COMMUNITY): Payer: Self-pay

## 2017-11-27 DIAGNOSIS — J432 Centrilobular emphysema: Secondary | ICD-10-CM | POA: Diagnosis not present

## 2017-11-27 DIAGNOSIS — Z951 Presence of aortocoronary bypass graft: Secondary | ICD-10-CM | POA: Diagnosis not present

## 2017-11-27 DIAGNOSIS — I700 Atherosclerosis of aorta: Secondary | ICD-10-CM | POA: Diagnosis not present

## 2017-11-27 DIAGNOSIS — J438 Other emphysema: Secondary | ICD-10-CM | POA: Insufficient documentation

## 2017-11-27 DIAGNOSIS — I2510 Atherosclerotic heart disease of native coronary artery without angina pectoris: Secondary | ICD-10-CM | POA: Diagnosis not present

## 2017-11-27 DIAGNOSIS — C3492 Malignant neoplasm of unspecified part of left bronchus or lung: Secondary | ICD-10-CM | POA: Insufficient documentation

## 2017-11-27 DIAGNOSIS — Z5111 Encounter for antineoplastic chemotherapy: Secondary | ICD-10-CM | POA: Diagnosis not present

## 2017-11-27 DIAGNOSIS — Z85118 Personal history of other malignant neoplasm of bronchus and lung: Secondary | ICD-10-CM | POA: Diagnosis not present

## 2017-11-27 MED ORDER — IOHEXOL 300 MG/ML  SOLN
75.0000 mL | Freq: Once | INTRAMUSCULAR | Status: AC | PRN
  Administered 2017-11-27: 75 mL via INTRAVENOUS

## 2017-12-04 ENCOUNTER — Inpatient Hospital Stay: Payer: Medicare Other

## 2017-12-04 ENCOUNTER — Inpatient Hospital Stay: Payer: Medicare Other | Admitting: Nutrition

## 2017-12-04 ENCOUNTER — Inpatient Hospital Stay: Payer: Medicare Other | Admitting: Internal Medicine

## 2017-12-05 ENCOUNTER — Ambulatory Visit: Payer: Medicare Other | Admitting: Internal Medicine

## 2017-12-05 DIAGNOSIS — Z0289 Encounter for other administrative examinations: Secondary | ICD-10-CM

## 2017-12-06 ENCOUNTER — Telehealth: Payer: Self-pay | Admitting: *Deleted

## 2017-12-06 NOTE — Telephone Encounter (Signed)
[name] called lmov[facility] states " [name] doesn't want to come back for any more appointments" Returned call [name], unable to reach on mobile or home#.

## 2017-12-11 ENCOUNTER — Inpatient Hospital Stay: Payer: Medicare Other | Attending: Internal Medicine

## 2017-12-11 ENCOUNTER — Other Ambulatory Visit: Payer: Medicare Other

## 2017-12-11 ENCOUNTER — Inpatient Hospital Stay: Payer: Medicare Other

## 2017-12-11 NOTE — Progress Notes (Signed)
Pt was No Show for Lab and Flush appointment today. Attempted to call his home with no answer noted.

## 2017-12-18 ENCOUNTER — Other Ambulatory Visit: Payer: Medicare Other

## 2017-12-18 ENCOUNTER — Inpatient Hospital Stay: Payer: Medicare Other

## 2017-12-24 ENCOUNTER — Inpatient Hospital Stay: Payer: Medicare Other

## 2017-12-24 ENCOUNTER — Inpatient Hospital Stay: Payer: Medicare Other | Admitting: Internal Medicine

## 2017-12-24 ENCOUNTER — Telehealth: Payer: Self-pay | Admitting: Medical Oncology

## 2017-12-24 NOTE — Telephone Encounter (Signed)
[name] confirmed pt does not want to come back for any more appts. And he wants his port removed. She reports that he is getting up and going with her to store and commented that he  "seems to be stronger". I told her to keep his oct 22 port flush appt and call back if he refuses to come in. I told [name] to call us if she or Gertrude needs anything and that he is still our pt.

## 2017-12-31 ENCOUNTER — Inpatient Hospital Stay: Payer: Medicare Other

## 2018-01-07 ENCOUNTER — Other Ambulatory Visit: Payer: Medicare Other

## 2018-01-14 ENCOUNTER — Ambulatory Visit: Payer: Medicare Other | Admitting: Internal Medicine

## 2018-01-14 ENCOUNTER — Other Ambulatory Visit: Payer: Medicare Other

## 2018-01-14 ENCOUNTER — Other Ambulatory Visit: Payer: Self-pay | Admitting: Internal Medicine

## 2018-01-14 ENCOUNTER — Ambulatory Visit: Payer: Medicare Other

## 2018-01-21 ENCOUNTER — Other Ambulatory Visit: Payer: Medicare Other

## 2018-01-28 ENCOUNTER — Other Ambulatory Visit: Payer: Medicare Other

## 2018-02-03 ENCOUNTER — Telehealth: Payer: Self-pay | Admitting: Medical Oncology

## 2018-02-03 NOTE — Telephone Encounter (Signed)
Pt has new number [phone]-noted in chart

## 2018-02-04 ENCOUNTER — Ambulatory Visit: Payer: Medicare Other | Admitting: Oncology

## 2018-02-04 ENCOUNTER — Other Ambulatory Visit: Payer: Medicare Other

## 2018-02-04 ENCOUNTER — Ambulatory Visit: Payer: Medicare Other

## 2018-02-11 ENCOUNTER — Other Ambulatory Visit: Payer: Medicare Other

## 2018-02-18 ENCOUNTER — Inpatient Hospital Stay: Payer: Medicare Other

## 2018-02-18 ENCOUNTER — Inpatient Hospital Stay: Payer: Medicare Other | Attending: Internal Medicine

## 2018-02-25 ENCOUNTER — Other Ambulatory Visit: Payer: Medicare Other

## 2018-02-25 ENCOUNTER — Ambulatory Visit: Payer: Medicare Other

## 2018-02-25 ENCOUNTER — Ambulatory Visit: Payer: Medicare Other | Admitting: Internal Medicine

## 2018-03-14 ENCOUNTER — Observation Stay (HOSPITAL_COMMUNITY)
Admission: EM | Admit: 2018-03-14 | Discharge: 2018-03-17 | Disposition: A | Discharge status: Home / Self Care | Payer: Medicare Other | Attending: Internal Medicine | Admitting: Internal Medicine

## 2018-03-14 ENCOUNTER — Encounter (HOSPITAL_COMMUNITY): Payer: Self-pay | Admitting: Emergency Medicine

## 2018-03-14 ENCOUNTER — Emergency Department (HOSPITAL_COMMUNITY): Payer: Medicare Other

## 2018-03-14 DIAGNOSIS — I959 Hypotension, unspecified: Secondary | ICD-10-CM | POA: Diagnosis not present

## 2018-03-14 DIAGNOSIS — Z66 Do not resuscitate: Secondary | ICD-10-CM | POA: Diagnosis not present

## 2018-03-14 DIAGNOSIS — Z79899 Other long term (current) drug therapy: Secondary | ICD-10-CM | POA: Insufficient documentation

## 2018-03-14 DIAGNOSIS — IMO0002 Reserved for concepts with insufficient information to code with codable children: Secondary | ICD-10-CM

## 2018-03-14 DIAGNOSIS — S0990XA Unspecified injury of head, initial encounter: Secondary | ICD-10-CM | POA: Diagnosis not present

## 2018-03-14 DIAGNOSIS — I10 Essential (primary) hypertension: Secondary | ICD-10-CM | POA: Diagnosis not present

## 2018-03-14 DIAGNOSIS — E785 Hyperlipidemia, unspecified: Secondary | ICD-10-CM | POA: Diagnosis not present

## 2018-03-14 DIAGNOSIS — I255 Ischemic cardiomyopathy: Secondary | ICD-10-CM | POA: Insufficient documentation

## 2018-03-14 DIAGNOSIS — I2510 Atherosclerotic heart disease of native coronary artery without angina pectoris: Secondary | ICD-10-CM | POA: Diagnosis present

## 2018-03-14 DIAGNOSIS — J449 Chronic obstructive pulmonary disease, unspecified: Secondary | ICD-10-CM | POA: Diagnosis not present

## 2018-03-14 DIAGNOSIS — C3490 Malignant neoplasm of unspecified part of unspecified bronchus or lung: Secondary | ICD-10-CM | POA: Diagnosis present

## 2018-03-14 DIAGNOSIS — R0902 Hypoxemia: Secondary | ICD-10-CM

## 2018-03-14 DIAGNOSIS — R64 Cachexia: Secondary | ICD-10-CM

## 2018-03-14 DIAGNOSIS — I110 Hypertensive heart disease with heart failure: Secondary | ICD-10-CM | POA: Diagnosis not present

## 2018-03-14 DIAGNOSIS — Z9221 Personal history of antineoplastic chemotherapy: Secondary | ICD-10-CM | POA: Diagnosis not present

## 2018-03-14 DIAGNOSIS — C3492 Malignant neoplasm of unspecified part of left bronchus or lung: Secondary | ICD-10-CM | POA: Diagnosis not present

## 2018-03-14 DIAGNOSIS — Z515 Encounter for palliative care: Secondary | ICD-10-CM | POA: Diagnosis not present

## 2018-03-14 DIAGNOSIS — I739 Peripheral vascular disease, unspecified: Secondary | ICD-10-CM | POA: Insufficient documentation

## 2018-03-14 DIAGNOSIS — R404 Transient alteration of awareness: Secondary | ICD-10-CM | POA: Diagnosis not present

## 2018-03-14 DIAGNOSIS — J439 Emphysema, unspecified: Secondary | ICD-10-CM | POA: Diagnosis present

## 2018-03-14 DIAGNOSIS — I491 Atrial premature depolarization: Secondary | ICD-10-CM | POA: Diagnosis not present

## 2018-03-14 DIAGNOSIS — F17210 Nicotine dependence, cigarettes, uncomplicated: Secondary | ICD-10-CM | POA: Diagnosis not present

## 2018-03-14 DIAGNOSIS — R627 Adult failure to thrive: Secondary | ICD-10-CM

## 2018-03-14 DIAGNOSIS — R51 Headache: Secondary | ICD-10-CM | POA: Diagnosis not present

## 2018-03-14 DIAGNOSIS — C44329 Squamous cell carcinoma of skin of other parts of face: Secondary | ICD-10-CM | POA: Diagnosis not present

## 2018-03-14 DIAGNOSIS — C799 Secondary malignant neoplasm of unspecified site: Secondary | ICD-10-CM

## 2018-03-14 DIAGNOSIS — R5381 Other malaise: Secondary | ICD-10-CM | POA: Diagnosis not present

## 2018-03-14 DIAGNOSIS — I714 Abdominal aortic aneurysm, without rupture: Secondary | ICD-10-CM | POA: Insufficient documentation

## 2018-03-14 DIAGNOSIS — I5022 Chronic systolic (congestive) heart failure: Secondary | ICD-10-CM | POA: Diagnosis not present

## 2018-03-14 DIAGNOSIS — S299XXA Unspecified injury of thorax, initial encounter: Secondary | ICD-10-CM | POA: Diagnosis not present

## 2018-03-14 DIAGNOSIS — R0602 Shortness of breath: Secondary | ICD-10-CM | POA: Diagnosis not present

## 2018-03-14 LAB — CBG MONITORING, ED: Glucose-Capillary: 83 mg/dL (ref 70–99)

## 2018-03-14 MED ORDER — SODIUM CHLORIDE 0.9 % IV SOLN
1000.0000 mL | Freq: Once | INTRAVENOUS | Status: AC
  Administered 2018-03-15: 1000 mL via INTRAVENOUS

## 2018-03-14 NOTE — ED Triage Notes (Signed)
-  Arrived via EMS from home -C/C fall, AMS -Patient was found in his urine, most likely slid off the bed onto floor, no injuries, no abrasions, sister found him on floor, unwitnessed fall, patient stopped CA treatments 2 months ago, steadily declining since then -EMS placed an IV 18 LFA, 500 ml NS en route -Lung sounds diminished, hx of lung CA -Patient is alert and answered some questions such as name, where he lives  Vitals  -BP 74/48 -CBG 189 -HR 60 w/ PVCs -10 L O2 96%, before O2 80s range

## 2018-03-14 NOTE — ED Notes (Signed)
Bed: MV36 Expected date:  Expected time:  Means of arrival:  Comments: 68 yr old lung cancer, fall, AMS

## 2018-03-14 NOTE — ED Provider Notes (Signed)
[facility] DEPT Provider Note   CSN: [id number] Arrival date & time: 03/14/18  2249     History   Chief Complaint Chief Complaint  Patient presents with  . Fall  . Altered Mental Status    HPI [name] is a 68 y.o. male with an extensive past medical history.  He has a history of metastatic stage IV squamous cell carcinoma of the lung and stopped treatment back in October 2019.  He lives currently with his sister who is at bedside.  Patient has altered mental status making it a level 5 caveat and history is predominantly given by the patient's sister.  She apparently found him on the floor in a puddle of urine today.  She states that over the past few months he has had significant decline including cachexia and weakness.  He have not discussed end-of-life care however she says that she feels like he has given up and wants to die.  The patient also states the same that he feels ready to go.  The patient does not normally require oxygen however was found to have hypoxia into the 80s and is currently 88% on 10 L via nonrebreather.  The patient has apparent extraparametal symptoms however the patient sister states that he has had a mental health issue and she does not feel like he is more confused than normal.  I discussed goals of care with the patient's sister and the patient at bedside and they agree that hospice would be their best course.  HPI  Past Medical History:  Diagnosis Date  . AAA (abdominal aortic aneurysm) ([place])   . Abdominal aortic aneurysm ([place])    AAA And bilateral common iliac artery aneurysms  . BPH (benign prostatic hyperplasia)   . CAD (coronary artery disease)    Dr Ron Parker @ [facility]   . Chronic mental illness 04/21/2011   Diagnosis unclear,  Family provides good care  . COPD (chronic obstructive pulmonary disease) ([place]) 04/21/2011   smoking  . Drug-induced skin rash 09/05/2016  . Ejection fraction    EF 35-40%, echo, May, 2010  .  Hyperlipidemia   . Hyperplastic colon polyp 04/21/2011  . Hypertension   . Preop cardiovascular exam    Cardiac clearance for major vascular surgery, May, 2013  . Pulmonary nodule    Chest CT May, 2013, 2 small pulmonary nodules, needs appropriate followup,  this CT was not ordered by the cardiology team  . PVD (peripheral vascular disease) ([place]) 04/21/2011  . Retention of urine   . Shortness of breath   . Squamous cell lung cancer, left ([place]) 06/13/2016  . Tobacco abuse     Patient Active Problem List   Diagnosis Date Noted  . Port-A-Cath in place 10/09/2017  . Aortic atherosclerosis ([place]) 08/21/2017  . Emphysema of lung ([place]) 08/21/2017  . Stage IV squamous cell carcinoma of left lung ([place]) 07/23/2017  . Encounter for antineoplastic immunotherapy 09/05/2016  . Drug-induced skin rash 09/05/2016  . Esophagitis 07/02/2016  . Squamous cell lung cancer, left ([place]) 06/13/2016  . Goals of care, counseling/discussion 06/13/2016  . Lung cancer ([place]) 04/13/2016  . Syncope 03/28/2016  . Near syncope 03/28/2016  . Smoker 05/28/2014  . Abdominal aneurysm without mention of rupture 08/31/2011  . Aneurysm of iliac artery ([place]) 08/31/2011  . Preop cardiovascular exam   . Abdominal aortic aneurysm ([place])   . Lung mass   . Hypertension   . Hyperlipidemia   . CAD (coronary artery disease)   .  Hx of CABG   . Ejection fraction   . Tobacco abuse   . Iliac artery aneurysm, bilateral ([place]) 06/22/2011  . Fatigue 04/23/2011  . PSA elevation 04/23/2011  . Encounter for antineoplastic chemotherapy 04/21/2011  . COPD (chronic obstructive pulmonary disease) ([place]) 04/21/2011  . Hyperplastic colon polyp 04/21/2011  . PVD (peripheral vascular disease) ([place]) 04/21/2011  . Gastritis and duodenitis 04/21/2011  . Chronic mental illness 04/21/2011  . CONSTIPATION 11/12/2008  . CORONARY ARTERY BYPASS GRAFT, HX OF 11/09/2008  . CARDIOMYOPATHY, ISCHEMIC 08/23/2008  . HEMOCCULT POSITIVE STOOL 08/23/2008     Past Surgical History:  Procedure Laterality Date  . ABDOMINAL AORTA STENT  07/2011  . ABDOMINAL AORTAGRAM N/A 07/26/2011   Procedure: ABDOMINAL Maxcine Ham;  Surgeon: Conrad Blodgett, MD;  Location: [facility] CATH LAB;  Service: Cardiovascular;  Laterality: N/A;  . ABDOMINAL AORTIC ANEURYSM REPAIR  07/2011  . CORONARY ARTERY BYPASS GRAFT  07/2008   CABG X5/notes 07/12/2010  . EMBOLIZATION Left 07/26/2011   Procedure: EMBOLIZATION;  Surgeon: Conrad Golden, MD;  Location: [facility] CATH LAB;  Service: Cardiovascular;  Laterality: Left;  . IR IMAGING GUIDED PORT INSERTION  09/05/2017  . TRANSURETHRAL RESECTION OF PROSTATE  11/29/2011   Procedure: TRANSURETHRAL RESECTION OF THE PROSTATE WITH GYRUS INSTRUMENTS;  Surgeon: Malka So, MD;  Location: [facility] ORS;  Service: Urology;  Laterality: N/A;  Prostate Ultrasound, and Biopsy  . VIDEO BRONCHOSCOPY Bilateral 04/12/2016   Procedure: VIDEO BRONCHOSCOPY WITH FLUORO;  Surgeon: Juanito Doom, MD;  Location: [facility];  Service: Cardiopulmonary;  Laterality: Bilateral;        Home Medications    Prior to Admission medications   Medication Sig Start Date End Date Taking? Authorizing Provider  albuterol (PROVENTIL HFA;VENTOLIN HFA) 108 (90 Base) MCG/ACT inhaler Inhale 1-2 puffs into the lungs every 6 (six) hours as needed for wheezing or shortness of breath. Patient not taking: Reported on 10/02/2017 09/18/17   Varney Biles, MD  dexamethasone (DECADRON) 4 MG tablet Take 2 tabs twice a day the day before, day of, and day after each cycle of chemo 07/23/17   Maryanna Shape, NP  ondansetron (ZOFRAN ODT) 4 MG disintegrating tablet Take 1 tablet (4 mg total) by mouth every 8 (eight) hours as needed for nausea or vomiting. Patient not taking: Reported on 09/18/2017 03/17/17   Rodell Perna A, PA-C  potassium chloride (K-DUR) 10 MEQ tablet Take 1 tablet (10 mEq total) by mouth daily. Patient not taking: Reported on 11/13/2017 08/21/17   Gardenia Phlegm, NP   prochlorperazine (COMPAZINE) 10 MG tablet Take 1 tablet (10 mg total) by mouth every 6 (six) hours as needed for nausea or vomiting. Patient not taking: Reported on 09/18/2017 06/26/16   Curt Bears, MD  psyllium (METAMUCIL) 58.6 % packet Take 1 packet by mouth daily. Patient not taking: Reported on 11/13/2017 03/17/17   Rodell Perna A, PA-C  rosuvastatin (CRESTOR) 40 MG tablet TAKE 1 TABLET BY MOUTH EVERY DAY 01/14/18   [name], MD  sucralfate (CARAFATE) 1 GM/10ML suspension Take 10 mLs (1 g total) by mouth 4 (four) times daily -  with meals and at bedtime. Patient not taking: Reported on 09/18/2017 07/02/16   Maryanna Shape, NP    Family History Family History  Problem Relation Age of Onset  . Cancer Father        prostate  . Heart disease Father   . Diabetes Father   . Heart attack Father 14  . Arthritis Sister   .  Hyperlipidemia Sister   . Hypertension Sister   . Anesthesia problems Neg Hx     Social History Social History   Tobacco Use  . Smoking status: Current Every Day Smoker    Packs/day: 1.00    Years: 50.00    Pack years: 50.00    Types: Cigarettes  . Smokeless tobacco: Never Used  Substance Use Topics  . Alcohol use: No  . Drug use: No     Allergies   Ace inhibitors   Review of Systems Review of Systems  Unable to perform ROS: Mental status change     Physical Exam Updated Vital Signs BP (!) 130/101 (BP Location: Left Arm)   Pulse 83   Resp (!) 32   Ht 6\' 3"  (1.905 m)   SpO2 (!) 88%   BMI 14.81 kg/m   Physical Exam Vitals signs and nursing note reviewed.  Constitutional:      General: He is not in acute distress.    Appearance: He is well-developed. He is cachectic. He is ill-appearing. He is not diaphoretic.  HENT:     Head: Normocephalic and atraumatic.  Eyes:     General: No scleral icterus.    Conjunctiva/sclera: Conjunctivae normal.  Neck:     Musculoskeletal: Normal range of motion and neck supple.  Cardiovascular:      Rate and Rhythm: Normal rate and regular rhythm.     Heart sounds: Normal heart sounds.  Pulmonary:     Effort: Pulmonary effort is normal. No respiratory distress.     Breath sounds: Rhonchi present. No rales.     Comments: Rhonchi that clear with cough Abdominal:     Palpations: Abdomen is soft.     Tenderness: There is no abdominal tenderness.     Comments: scaphoid  Musculoskeletal:     Right lower leg: No edema.     Left lower leg: No edema.  Skin:    General: Skin is warm and dry.  Neurological:     Mental Status: He is alert.  Psychiatric:        Behavior: Behavior normal.      ED Treatments / Results  Labs (all labs ordered are listed, but only abnormal results are displayed) Labs Reviewed - No data to display  EKG EKG Interpretation  Date/Time:  Friday March 14 2018 23:54:39 EST Ventricular Rate:  76 PR Interval:    QRS Duration: 104 QT Interval:  417 QTC Calculation: 469 R Axis:   56 Text Interpretation:  Age not entered, assumed to be  68 years old for purpose of ECG interpretation Sinus rhythm Probable left atrial enlargement Low voltage, precordial leads Confirmed by Virgel Manifold [phone]) on 03/15/2018 12:10:26 AM   Radiology No results found.  Procedures Procedures (including critical care time)  Medications Ordered in ED Medications - No data to display   Initial Impression / Assessment and Plan / ED Course  I have reviewed the triage vital signs and the nursing notes.  Pertinent labs & imaging results that were available during my care of the patient were reviewed by me and considered in my medical decision making (see chart for details).     68 year old male with metastatic lung cancer, cachexia, hypoxia and failure to thrive.  After discussion about goals of care with both the patient and his sister who at bedside we have decided to consult hospice/palliative care. Given the patient's shortness of breath and hypoxia I do not feel that it  is necessary to evaluate for  acute pulmonary embolus given his goals of care.  I reviewed the patient's chest x-ray and head CT.  Head CT is negative.  Chest x-ray shows worsening lung mass.  EKG shows no acute signs of ischemia or arrhythmia.  Labs are otherwise unremarkable.  I have spoke with Dr. to palpate who will admit the patient. Final Clinical Impressions(s) / ED Diagnoses   Final diagnoses:  Admission for hospice care  Hypoxia  Malignant cachexia [facility])  Metastatic squamous cell carcinoma ([place])  Failure to thrive in adult    ED Discharge Orders    None       Margarita Mail, PA-C 03/15/18 5825    Virgel Manifold, MD 03/15/18 2340

## 2018-03-14 NOTE — ED Notes (Signed)
Patient transported to CT, EKG delayed.

## 2018-03-15 ENCOUNTER — Other Ambulatory Visit: Payer: Self-pay

## 2018-03-15 ENCOUNTER — Emergency Department (HOSPITAL_COMMUNITY): Payer: Medicare Other

## 2018-03-15 ENCOUNTER — Encounter (HOSPITAL_COMMUNITY): Payer: Self-pay | Admitting: Family Medicine

## 2018-03-15 DIAGNOSIS — R627 Adult failure to thrive: Secondary | ICD-10-CM | POA: Diagnosis not present

## 2018-03-15 DIAGNOSIS — J431 Panlobular emphysema: Secondary | ICD-10-CM | POA: Diagnosis not present

## 2018-03-15 DIAGNOSIS — Z515 Encounter for palliative care: Secondary | ICD-10-CM

## 2018-03-15 DIAGNOSIS — R5381 Other malaise: Secondary | ICD-10-CM | POA: Diagnosis not present

## 2018-03-15 DIAGNOSIS — I5022 Chronic systolic (congestive) heart failure: Secondary | ICD-10-CM | POA: Diagnosis not present

## 2018-03-15 DIAGNOSIS — C3412 Malignant neoplasm of upper lobe, left bronchus or lung: Secondary | ICD-10-CM | POA: Diagnosis not present

## 2018-03-15 LAB — COMPREHENSIVE METABOLIC PANEL
ALT: 14 U/L (ref 0–44)
AST: 18 U/L (ref 15–41)
Albumin: 2.8 g/dL — ABNORMAL LOW (ref 3.5–5.0)
Alkaline Phosphatase: 103 U/L (ref 38–126)
Anion gap: 11 (ref 5–15)
BUN: 20 mg/dL (ref 8–23)
CO2: 26 mmol/L (ref 22–32)
Calcium: 9.5 mg/dL (ref 8.9–10.3)
Chloride: 99 mmol/L (ref 98–111)
Creatinine, Ser: 0.97 mg/dL (ref 0.61–1.24)
GFR calc Af Amer: >60 mL/min (ref >60)
GFR calc non Af Amer: >60 mL/min (ref >60)
GLUCOSE: 106 mg/dL — AB (ref 70–99)
Potassium: 4.1 mmol/L (ref 3.5–5.1)
Sodium: 136 mmol/L (ref 135–145)
Total Bilirubin: 0.3 mg/dL (ref 0.3–1.2)
Total Protein: 7.4 g/dL (ref 6.5–8.1)

## 2018-03-15 LAB — CBC WITH DIFFERENTIAL/PLATELET
Abs Immature Granulocytes: 0.13 10*3/uL — ABNORMAL HIGH (ref 0.00–0.07)
Basophils Absolute: 0 10*3/uL (ref 0.0–0.1)
Basophils Relative: 0 %
EOS PCT: 0 %
Eosinophils Absolute: 0 10*3/uL (ref 0.0–0.5)
HCT: 41.7 % (ref 39.0–52.0)
Hemoglobin: 12.3 g/dL — ABNORMAL LOW (ref 13.0–17.0)
Immature Granulocytes: 1 %
Lymphocytes Relative: 6 %
Lymphs Abs: 0.6 10*3/uL — ABNORMAL LOW (ref 0.7–4.0)
MCH: 26 pg (ref 26.0–34.0)
MCHC: 29.5 g/dL — ABNORMAL LOW (ref 30.0–36.0)
MCV: 88.2 fL (ref 80.0–100.0)
Monocytes Absolute: 0.6 10*3/uL (ref 0.1–1.0)
Monocytes Relative: 6 %
Neutro Abs: 8.4 10*3/uL — ABNORMAL HIGH (ref 1.7–7.7)
Neutrophils Relative %: 87 %
Platelets: 308 10*3/uL (ref 150–400)
RBC: 4.73 MIL/uL (ref 4.22–5.81)
RDW: 17.7 % — ABNORMAL HIGH (ref 11.5–15.5)
WBC: 9.8 10*3/uL (ref 4.0–10.5)
nRBC: 0 % (ref 0.0–0.2)

## 2018-03-15 LAB — URINALYSIS, ROUTINE W REFLEX MICROSCOPIC
Bilirubin Urine: NEGATIVE
Glucose, UA: NEGATIVE mg/dL
Hgb urine dipstick: NEGATIVE
Ketones, ur: NEGATIVE mg/dL
Leukocytes, UA: NEGATIVE
Nitrite: NEGATIVE
PROTEIN: NEGATIVE mg/dL
Specific Gravity, Urine: 1.015 (ref 1.005–1.030)
pH: 6 (ref 5.0–8.0)

## 2018-03-15 LAB — RAPID URINE DRUG SCREEN, HOSP PERFORMED
AMPHETAMINES: NOT DETECTED
Barbiturates: NOT DETECTED
Benzodiazepines: NOT DETECTED
COCAINE: NOT DETECTED
Opiates: NOT DETECTED
Tetrahydrocannabinol: NOT DETECTED

## 2018-03-15 LAB — I-STAT CG4 LACTIC ACID, ED: LACTIC ACID, VENOUS: 1.96 mmol/L — AB (ref 0.5–1.9)

## 2018-03-15 LAB — AMMONIA: Ammonia: 17 umol/L (ref 9–35)

## 2018-03-15 LAB — CK: Total CK: 44 U/L — ABNORMAL LOW (ref 49–397)

## 2018-03-15 MED ORDER — SODIUM CHLORIDE 0.9 % IV SOLN: 250.0000 mL | INTRAVENOUS | Status: DC | PRN

## 2018-03-15 MED ORDER — ACETAMINOPHEN 650 MG RE SUPP: 650.0000 mg | Freq: Four times a day (QID) | RECTAL | Status: DC | PRN

## 2018-03-15 MED ORDER — HYDROCODONE-ACETAMINOPHEN 5-325 MG PO TABS
1.0000 | ORAL_TABLET | ORAL | Status: DC | PRN
  Administered 2018-03-15: 2 via ORAL
  Filled 2018-03-15: qty 2

## 2018-03-15 MED ORDER — ONDANSETRON HCL 4 MG PO TABS: 4.0000 mg | ORAL_TABLET | Freq: Four times a day (QID) | ORAL | Status: DC | PRN

## 2018-03-15 MED ORDER — ALBUTEROL SULFATE (2.5 MG/3ML) 0.083% IN NEBU: 2.5000 mg | INHALATION_SOLUTION | RESPIRATORY_TRACT | Status: DC | PRN

## 2018-03-15 MED ORDER — POLYETHYLENE GLYCOL 3350 17 G PO PACK: 17.0000 g | PACK | Freq: Every day | ORAL | Status: DC | PRN

## 2018-03-15 MED ORDER — LORAZEPAM 0.5 MG PO TABS: 0.5000 mg | ORAL_TABLET | Freq: Four times a day (QID) | ORAL | Status: DC | PRN

## 2018-03-15 MED ORDER — MORPHINE SULFATE (PF) 4 MG/ML IV SOLN: 4.0000 mg | INTRAVENOUS | Status: DC | PRN

## 2018-03-15 MED ORDER — ONDANSETRON HCL 4 MG/2ML IJ SOLN: 4.0000 mg | Freq: Four times a day (QID) | INTRAMUSCULAR | Status: DC | PRN

## 2018-03-15 MED ORDER — SODIUM CHLORIDE 0.9% FLUSH: 3.0000 mL | INTRAVENOUS | Status: DC | PRN

## 2018-03-15 MED ORDER — ENOXAPARIN SODIUM 40 MG/0.4ML ~~LOC~~ SOLN
40.0000 mg | SUBCUTANEOUS | Status: DC
  Administered 2018-03-15: 40 mg via SUBCUTANEOUS
  Filled 2018-03-15: qty 0.4

## 2018-03-15 MED ORDER — ACETAMINOPHEN 325 MG PO TABS: 650.0000 mg | ORAL_TABLET | Freq: Four times a day (QID) | ORAL | Status: DC | PRN

## 2018-03-15 MED ORDER — ENSURE ENLIVE PO LIQD
237.0000 mL | Freq: Two times a day (BID) | ORAL | Status: DC
  Administered 2018-03-15 – 2018-03-17 (×5): 237 mL via ORAL

## 2018-03-15 MED ORDER — SODIUM CHLORIDE 0.9% FLUSH
3.0000 mL | Freq: Two times a day (BID) | INTRAVENOUS | Status: DC
  Administered 2018-03-15 – 2018-03-17 (×4): 3 mL via INTRAVENOUS

## 2018-03-15 NOTE — ED Notes (Signed)
ED TO INPATIENT HANDOFF REPORT  Name/Age/Gender [name] 68 y.o. male  Code Status Code Status History    Date Active Date Inactive Code Status Order ID Comments User Context   03/28/2016 2050 03/30/2016 2013 Full Code [id number]  Rise Patience, MD ED   11/29/2011 1330 11/30/2011 1803 Full Code [id number]  Lyda Jester, RN Inpatient   07/31/2011 1231 08/01/2011 1742 Full Code [id number]  Burnadette Peter, RN Inpatient   01/17/2011 0430 01/18/2011 1922 Full Code [id number]  Dimas Alexandria, RN Inpatient      Home/SNF/Other Home  Chief Complaint ams  Level of Care/Admitting Diagnosis ED Disposition    ED Disposition Condition [facility] Area: [facility] [[id number]]  Level of Care: Med-Surg [16]  Diagnosis: End of life care [phone]  Admitting Physician: Vianne Bulls [[id number]]  Attending Physician: Vianne Bulls [[id number]]  PT Class (Do Not Modify): Observation [104]  PT Acc Code (Do Not Modify): Observation [10022]       Medical History Past Medical History:  Diagnosis Date  . AAA (abdominal aortic aneurysm) ([place])   . Abdominal aortic aneurysm ([place])    AAA And bilateral common iliac artery aneurysms  . BPH (benign prostatic hyperplasia)   . CAD (coronary artery disease)    Dr Ron Parker @ [facility]   . Chronic mental illness 04/21/2011   Diagnosis unclear,  Family provides good care  . COPD (chronic obstructive pulmonary disease) ([place]) 04/21/2011   smoking  . Drug-induced skin rash 09/05/2016  . Ejection fraction    EF 35-40%, echo, May, 2010  . Hyperlipidemia   . Hyperplastic colon polyp 04/21/2011  . Hypertension   . Preop cardiovascular exam    Cardiac clearance for major vascular surgery, May, 2013  . Pulmonary nodule    Chest CT May, 2013, 2 small pulmonary nodules, needs appropriate followup,  this CT was not ordered by the cardiology team  . PVD (peripheral vascular disease) ([place]) 04/21/2011  . Retention of urine    . Shortness of breath   . Squamous cell lung cancer, left ([place]) 06/13/2016  . Tobacco abuse     Allergies Allergies  Allergen Reactions  . Ace Inhibitors Itching    IV Location/Drains/Wounds Patient Lines/Drains/Airways Status   Active Line/Drains/Airways    Name:   Placement date:   Placement time:   Site:   Days:   Implanted Port 09/05/17 Right Chest   09/05/17    1516    Chest   191   Peripheral IV 03/14/18 Left Forearm   03/14/18    2255    Forearm   1   Peripheral IV 03/15/18 Left Antecubital   03/15/18    0143    Antecubital   less than 1          Labs/Imaging Results for orders placed or performed during the hospital encounter of 03/14/18 (from the past 48 hour(s))  CBC with Differential     Status: Abnormal   Collection Time: 03/14/18 11:17 PM  Result Value Ref Range   WBC 9.8 4.0 - 10.5 K/uL   RBC 4.73 4.22 - 5.81 MIL/uL   Hemoglobin 12.3 (L) 13.0 - 17.0 g/dL   HCT 41.7 39.0 - 52.0 %   MCV 88.2 80.0 - 100.0 fL   MCH 26.0 26.0 - 34.0 pg   MCHC 29.5 (L) 30.0 - 36.0 g/dL   RDW 17.7 (H) 11.5 - 15.5 %   Platelets 308 150 -  400 K/uL   nRBC 0.0 0.0 - 0.2 %   Neutrophils Relative % 87 %   Neutro Abs 8.4 (H) 1.7 - 7.7 K/uL   Lymphocytes Relative 6 %   Lymphs Abs 0.6 (L) 0.7 - 4.0 K/uL   Monocytes Relative 6 %   Monocytes Absolute 0.6 0.1 - 1.0 K/uL   Eosinophils Relative 0 %   Eosinophils Absolute 0.0 0.0 - 0.5 K/uL   Basophils Relative 0 %   Basophils Absolute 0.0 0.0 - 0.1 K/uL   Immature Granulocytes 1 %   Abs Immature Granulocytes 0.13 (H) 0.00 - 0.07 K/uL    Comment: Performed at [facility], [place] [address] [place], [place] [address]  Comprehensive metabolic panel     Status: Abnormal   Collection Time: 03/14/18 11:17 PM  Result Value Ref Range   Sodium 136 135 - 145 mmol/L   Potassium 4.1 3.5 - 5.1 mmol/L   Chloride 99 98 - 111 mmol/L   CO2 26 22 - 32 mmol/L   Glucose, Bld 106 (H) 70 - 99 mg/dL   BUN 20 8 - 23 mg/dL   Creatinine,  Ser 0.97 0.61 - 1.24 mg/dL   Calcium 9.5 8.9 - 10.3 mg/dL   Total Protein 7.4 6.5 - 8.1 g/dL   Albumin 2.8 (L) 3.5 - 5.0 g/dL   AST 18 15 - 41 U/L   ALT 14 0 - 44 U/L   Alkaline Phosphatase 103 38 - 126 U/L   Total Bilirubin 0.3 0.3 - 1.2 mg/dL   GFR calc non Af Amer >60 >60 mL/min   GFR calc Af Amer >60 >60 mL/min   Anion gap 11 5 - 15    Comment: Performed at [facility], [place] [address] [place], [place] [address]  CK     Status: Abnormal   Collection Time: 03/14/18 11:17 PM  Result Value Ref Range   Total CK 44 (L) 49 - 397 U/L    Comment: Performed at [facility], [place] [address] [place], [place] [address]  CBG monitoring, ED     Status: None   Collection Time: 03/14/18 11:58 PM  Result Value Ref Range   Glucose-Capillary 83 70 - 99 mg/dL   Comment 1 Notify RN    Comment 2 Document in Chart   I-Stat CG4 Lactic Acid, ED     Status: Abnormal   Collection Time: 03/15/18 12:20 AM  Result Value Ref Range   Lactic Acid, Venous 1.96 (H) 0.5 - 1.9 mmol/L   Dg Chest 2 View  Result Date: 03/15/2018 CLINICAL DATA:  Acute onset of incontinence. Slid off bed; unwitnessed fall. Shortness of breath. EXAM: CHEST - 2 VIEW COMPARISON:  Chest radiograph performed 09/18/2017, and CT of the chest performed 11/27/2017 FINDINGS: There has been recurrence of the patient's left apical lung mass, now measuring approximately 8 cm in size. There is no evidence of pleural effusion or pneumothorax. Underlying emphysema is again noted. The heart is normal in size; the patient is status post median sternotomy, with evidence of prior CABG. A right-sided chest port is noted ending about the distal SVC. No acute osseous abnormalities are seen. IMPRESSION: Recurrent left apical lung mass, now measuring approximately 8 cm in size. Underlying emphysema again noted. Lungs otherwise grossly clear. Electronically Signed   By: Garald Balding M.D.   On: 03/15/2018 01:34   Ct Head Wo  Contrast  Result Date: 03/15/2018 CLINICAL DATA:  Status post fall. Acute onset  of headache. EXAM: CT HEAD WITHOUT CONTRAST TECHNIQUE: Contiguous axial images were obtained from the base of the skull through the vertex without intravenous contrast. COMPARISON:  CT of the head performed 03/28/2016 FINDINGS: Brain: No evidence of acute infarction, hemorrhage, hydrocephalus, extra-axial collection or mass lesion / mass effect. Prominence of the ventricles and sulci reflects mild cortical volume loss. Mild cerebellar atrophy is noted. Mild subcortical white matter change likely reflects small vessel ischemic microangiopathy. Dense calcification is seen at the basal ganglia bilaterally. The brainstem and fourth ventricle are within normal limits. The cerebral hemispheres demonstrate grossly normal gray-white differentiation. No mass effect or midline shift is seen. Vascular: No hyperdense vessel or unexpected calcification. Skull: There is no evidence of fracture; visualized osseous structures are unremarkable in appearance. Sinuses/Orbits: The orbits are within normal limits. The paranasal sinuses and mastoid air cells are well-aerated. Other: No significant soft tissue abnormalities are seen. IMPRESSION: 1. No evidence of traumatic intracranial injury or fracture. 2. Mild cortical volume loss and scattered small vessel ischemic microangiopathy. Electronically Signed   By: Garald Balding M.D.   On: 03/15/2018 00:01    Pending Labs Unresulted Labs (From admission, onward)    Start     Ordered   03/14/18 2320  Rapid urine drug screen (hospital performed)  ONCE - STAT,   R     03/14/18 2320   03/14/18 2318  Ammonia  (ED ALOC)  Once,   STAT     03/14/18 2320   03/14/18 2317  Urinalysis, Routine w reflex microscopic  (ED ALOC)  Once,   STAT     03/14/18 2320          Vitals/Pain Today's Vitals   03/15/18 0200 03/15/18 0230 03/15/18 0300 03/15/18 0330  BP: (!) 133/99 117/76 109/85 (!) 121/92  Pulse: 77  76 73 68  Resp: 15 14 15 16   TempSrc:      SpO2: 91% 100% 100% 100%  Height:      PainSc:        Isolation Precautions No active isolations  Medications Medications  0.9 %  sodium chloride infusion (1,000 mLs Intravenous New Bag/Given 03/15/18 0052)    Mobility walks with person assist

## 2018-03-15 NOTE — ED Notes (Signed)
Report given to Courtney, RN.

## 2018-03-15 NOTE — H&P (Signed)
History and Physical    [name] YOV:[id number] DOB: 1950-08-17 DOA: 03/14/2018  PCP: [name], MD   Patient coming from: Home   Chief Complaint: General decline, stopped cancer treatment, found on floor in puddle of urine   HPI: [name] is a 68 y.o. male with medical history significant for lung cancer, coronary artery disease, ischemic cardiomyopathy, emphysema, and hypertension, now presenting to the emergency department with progressive debility and decline.  He is accompanied by his sister with whom he lives, and who helps care for him.  Patient had undergone chemoradiation, had progression of disease on first-line chemotherapy, and had been receiving infusions of a second line chemotherapy every 3 weeks until he elected to stop all treatment 4 months ago.  Patient has not taken any medication since that time, has asked about having his port removed, and has indicated to his sister that he is ready to die.  His sister has been supportive of his decision, but has noted a progressive decline in his overall condition in recent months, and prior to admission had found him on the floor in a puddle of urine, likely having slid out of his bed and too weak to get up.  Patient does not have any complaints at this time and confirms that he is not interested in any further treatment.  ED Course: Upon arrival to the ED, patient is found to be afebrile, saturating mid 80s on room air, and with vitals otherwise normal.  Chemistry panel features and albumin of 2.8, CBC is unremarkable, lactic acid less than 2, and serum CK 44.  EKG features a sinus rhythm and chest x-ray is notable for recurrent left upper lobe mass with underlying emphysema and otherwise clear lungs.  No acute traumatic intracranial injury or fracture is noted on head CT.  Patient was placed on supplemental oxygen, remains hemodynamically stable, is not in any apparent respiratory distress, and will be observed for further  evaluation and management of progressive debility and decline with family inquiring about hospice or other options for end-of-life care.  Review of Systems:  All other systems reviewed and apart from HPI, are negative.  Past Medical History:  Diagnosis Date  . AAA (abdominal aortic aneurysm) ([place])   . Abdominal aortic aneurysm ([place])    AAA And bilateral common iliac artery aneurysms  . BPH (benign prostatic hyperplasia)   . CAD (coronary artery disease)    Dr Ron Parker @ [facility]   . Chronic mental illness 04/21/2011   Diagnosis unclear,  Family provides good care  . COPD (chronic obstructive pulmonary disease) ([place]) 04/21/2011   smoking  . Drug-induced skin rash 09/05/2016  . Ejection fraction    EF 35-40%, echo, May, 2010  . Hyperlipidemia   . Hyperplastic colon polyp 04/21/2011  . Hypertension   . Preop cardiovascular exam    Cardiac clearance for major vascular surgery, May, 2013  . Pulmonary nodule    Chest CT May, 2013, 2 small pulmonary nodules, needs appropriate followup,  this CT was not ordered by the cardiology team  . PVD (peripheral vascular disease) ([place]) 04/21/2011  . Retention of urine   . Shortness of breath   . Squamous cell lung cancer, left ([place]) 06/13/2016  . Tobacco abuse     Past Surgical History:  Procedure Laterality Date  . ABDOMINAL AORTA STENT  07/2011  . ABDOMINAL AORTAGRAM N/A 07/26/2011   Procedure: ABDOMINAL Maxcine Ham;  Surgeon: Conrad [place], MD;  Location: [facility] CATH LAB;  Service: Cardiovascular;  Laterality: N/A;  . ABDOMINAL AORTIC ANEURYSM REPAIR  07/2011  . CORONARY ARTERY BYPASS GRAFT  07/2008   CABG X5/notes 07/12/2010  . EMBOLIZATION Left 07/26/2011   Procedure: EMBOLIZATION;  Surgeon: Conrad Verdon, MD;  Location: [facility] CATH LAB;  Service: Cardiovascular;  Laterality: Left;  . IR IMAGING GUIDED PORT INSERTION  09/05/2017  . TRANSURETHRAL RESECTION OF PROSTATE  11/29/2011   Procedure: TRANSURETHRAL RESECTION OF THE PROSTATE WITH GYRUS INSTRUMENTS;  Surgeon: Malka So, MD;  Location: [facility] ORS;  Service: Urology;  Laterality: N/A;  Prostate Ultrasound, and Biopsy  . VIDEO BRONCHOSCOPY Bilateral 04/12/2016   Procedure: VIDEO BRONCHOSCOPY WITH FLUORO;  Surgeon: Juanito Doom, MD;  Location: [facility];  Service: Cardiopulmonary;  Laterality: Bilateral;     reports that he has been smoking cigarettes. He has a 50.00 pack-year smoking history. He has never used smokeless tobacco. He reports that he does not drink alcohol or use drugs.  Allergies  Allergen Reactions  . Ace Inhibitors Itching    Family History  Problem Relation Age of Onset  . Cancer Father        prostate  . Heart disease Father   . Diabetes Father   . Heart attack Father 89  . Arthritis Sister   . Hyperlipidemia Sister   . Hypertension Sister   . Anesthesia problems Neg Hx      Prior to Admission medications   Medication Sig Start Date End Date Taking? Authorizing Provider  albuterol (PROVENTIL HFA;VENTOLIN HFA) 108 (90 Base) MCG/ACT inhaler Inhale 1-2 puffs into the lungs every 6 (six) hours as needed for wheezing or shortness of breath. Patient not taking: Reported on 10/02/2017 09/18/17   Varney Biles, MD  dexamethasone (DECADRON) 4 MG tablet Take 2 tabs twice a day the day before, day of, and day after each cycle of chemo Patient not taking: Reported on 03/15/2018 07/23/17   Maryanna Shape, NP  ondansetron (ZOFRAN ODT) 4 MG disintegrating tablet Take 1 tablet (4 mg total) by mouth every 8 (eight) hours as needed for nausea or vomiting. Patient not taking: Reported on 09/18/2017 03/17/17   Rodell Perna A, PA-C  potassium chloride (K-DUR) 10 MEQ tablet Take 1 tablet (10 mEq total) by mouth daily. Patient not taking: Reported on 11/13/2017 08/21/17   Gardenia Phlegm, NP  prochlorperazine (COMPAZINE) 10 MG tablet Take 1 tablet (10 mg total) by mouth every 6 (six) hours as needed for nausea or vomiting. Patient not taking: Reported on 09/18/2017 06/26/16   Curt Bears, MD  psyllium (METAMUCIL) 58.6 % packet Take 1 packet by mouth daily. Patient not taking: Reported on 11/13/2017 03/17/17   Rodell Perna A, PA-C  rosuvastatin (CRESTOR) 40 MG tablet TAKE 1 TABLET BY MOUTH EVERY DAY Patient not taking: Reported on 03/15/2018 01/14/18   [name], MD  sucralfate (CARAFATE) 1 GM/10ML suspension Take 10 mLs (1 g total) by mouth 4 (four) times daily -  with meals and at bedtime. Patient not taking: Reported on 09/18/2017 07/02/16   Maryanna Shape, NP    Physical Exam: Vitals:   03/15/18 0200 03/15/18 0230 03/15/18 0300 03/15/18 0330  BP: (!) 133/99 117/76 109/85 (!) 121/92  Pulse: 77 76 73 68  Resp: 15 14 15 16   TempSrc:      SpO2: 91% 100% 100% 100%  Height:        Constitutional: NAD, calm, cachectic  Eyes: PERTLA, lids and conjunctivae normal ENMT: Mucous membranes are  moist. Posterior pharynx clear of any exudate or lesions.   Neck: normal, supple, no masses, no thyromegaly Respiratory: Diminished bilaterally with prolonged expiratory phase, no wheezing, no crackles. Normal respiratory effort.    Cardiovascular: S1 & S2 heard, regular rate and rhythm. No extremity edema.   Abdomen: No distension, no tenderness, soft. Bowel sounds active.  Musculoskeletal: no clubbing / cyanosis. No joint deformity upper and lower extremities.  Skin: no significant rashes, lesions, ulcers. Warm, dry, well-perfused. Neurologic: no facial asymmetry. Sensation intact. Moving all extermities.  Psychiatric: Alert and oriented to person and place, not month or yr. Calm, cooperative.    Labs on Admission: I have personally reviewed following labs and imaging studies  CBC: Recent Labs  Lab 03/14/18 2317  WBC 9.8  NEUTROABS 8.4*  HGB 12.3*  HCT 41.7  MCV 88.2  PLT 427   Basic Metabolic Panel: Recent Labs  Lab 03/14/18 2317  NA 136  K 4.1  CL 99  CO2 26  GLUCOSE 106*  BUN 20  CREATININE 0.97  CALCIUM 9.5   GFR: CrCl cannot be calculated (Unknown  ideal weight.). Liver Function Tests: Recent Labs  Lab 03/14/18 2317  AST 18  ALT 14  ALKPHOS 103  BILITOT 0.3  PROT 7.4  ALBUMIN 2.8*   No results for input(s): LIPASE, AMYLASE in the last 168 hours. No results for input(s): AMMONIA in the last 168 hours. Coagulation Profile: No results for input(s): INR, PROTIME in the last 168 hours. Cardiac Enzymes: Recent Labs  Lab 03/14/18 2317  CKTOTAL 44*   BNP (last 3 results) No results for input(s): PROBNP in the last 8760 hours. HbA1C: No results for input(s): HGBA1C in the last 72 hours. CBG: Recent Labs  Lab 03/14/18 2358  GLUCAP 83   Lipid Profile: No results for input(s): CHOL, HDL, LDLCALC, TRIG, CHOLHDL, LDLDIRECT in the last 72 hours. Thyroid Function Tests: No results for input(s): TSH, T4TOTAL, FREET4, T3FREE, THYROIDAB in the last 72 hours. Anemia Panel: No results for input(s): VITAMINB12, FOLATE, FERRITIN, TIBC, IRON, RETICCTPCT in the last 72 hours. Urine analysis:    Component Value Date/Time   COLORURINE YELLOW 03/17/2017 1031   APPEARANCEUR CLEAR 03/17/2017 1031   LABSPEC 1.027 03/17/2017 1031   PHURINE 5.0 03/17/2017 1031   GLUCOSEU 50 (A) 03/17/2017 1031   GLUCOSEU NEGATIVE 05/28/2014 1146   HGBUR NEGATIVE 03/17/2017 1031   BILIRUBINUR NEGATIVE 03/17/2017 1031   KETONESUR NEGATIVE 03/17/2017 1031   PROTEINUR NEGATIVE 11/13/2017 0847   UROBILINOGEN 0.2 05/28/2014 1146   NITRITE NEGATIVE 03/17/2017 1031   LEUKOCYTESUR NEGATIVE 03/17/2017 1031   Sepsis Labs: @LABRCNTIP (procalcitonin:4,lacticidven:4) )No results found for this or any previous visit (from the past 240 hour(s)).   Radiological Exams on Admission: Dg Chest 2 View  Result Date: 03/15/2018 CLINICAL DATA:  Acute onset of incontinence. Slid off bed; unwitnessed fall. Shortness of breath. EXAM: CHEST - 2 VIEW COMPARISON:  Chest radiograph performed 09/18/2017, and CT of the chest performed 11/27/2017 FINDINGS: There has been recurrence of  the patient's left apical lung mass, now measuring approximately 8 cm in size. There is no evidence of pleural effusion or pneumothorax. Underlying emphysema is again noted. The heart is normal in size; the patient is status post median sternotomy, with evidence of prior CABG. A right-sided chest port is noted ending about the distal SVC. No acute osseous abnormalities are seen. IMPRESSION: Recurrent left apical lung mass, now measuring approximately 8 cm in size. Underlying emphysema again noted. Lungs otherwise grossly clear. Electronically  Signed   By: Garald Balding M.D.   On: 03/15/2018 01:34   Ct Head Wo Contrast  Result Date: 03/15/2018 CLINICAL DATA:  Status post fall. Acute onset of headache. EXAM: CT HEAD WITHOUT CONTRAST TECHNIQUE: Contiguous axial images were obtained from the base of the skull through the vertex without intravenous contrast. COMPARISON:  CT of the head performed 03/28/2016 FINDINGS: Brain: No evidence of acute infarction, hemorrhage, hydrocephalus, extra-axial collection or mass lesion / mass effect. Prominence of the ventricles and sulci reflects mild cortical volume loss. Mild cerebellar atrophy is noted. Mild subcortical white matter change likely reflects small vessel ischemic microangiopathy. Dense calcification is seen at the basal ganglia bilaterally. The brainstem and fourth ventricle are within normal limits. The cerebral hemispheres demonstrate grossly normal gray-white differentiation. No mass effect or midline shift is seen. Vascular: No hyperdense vessel or unexpected calcification. Skull: There is no evidence of fracture; visualized osseous structures are unremarkable in appearance. Sinuses/Orbits: The orbits are within normal limits. The paranasal sinuses and mastoid air cells are well-aerated. Other: No significant soft tissue abnormalities are seen. IMPRESSION: 1. No evidence of traumatic intracranial injury or fracture. 2. Mild cortical volume loss and scattered  small vessel ischemic microangiopathy. Electronically Signed   By: Garald Balding M.D.   On: 03/15/2018 00:01    EKG: Independently reviewed. Sinus rhythm.   Assessment/Plan  1. Debility and decline  - Presents with progressive physical debility and decline since stopping cancer treatment 4 months ago  - His sister [name], with whom he lives, and who helps to care for him, is supportive of his decision to stop all treatment  - Patient's sister expresses interest in hospice and patient is in agreement   - Will continue general supportive care, consult with palliative care   2. Lung cancer  - Patient underwent concomitant chemoradiation, had disease progression on initial chemotherapy regimen, and had then been receiving a second line regimen every 3 weeks until he elected to stop all treatment in September  - Patient confirms desire for no more treatment and his family is supportive of his decision    3. COPD  - No cough or wheeze on admission  - Continue supplemental O2 and albuterol nebs as needed   4. Ischemic cardiomyopathy  - EF 30-35% in January 2018  - Has not been on diuretics  - Likely hypovolemic on admission - Exercise caution with IVF    DVT prophylaxis: Lovenox   Code Status: DNR  Family Communication: Sister updated at bedside Consults called: Palliative  Admission status: Observation     Vianne Bulls, MD [facility] Pager [phone]  If 7PM-7AM, please contact night-coverage [email or web address] Password [email or web address]  03/15/2018, 4:01 AM

## 2018-03-15 NOTE — ED Notes (Signed)
Patient transported to CT 

## 2018-03-15 NOTE — Progress Notes (Signed)
PROGRESS NOTE    [name]  WGY:[id number] DOB: September 30, 1950 DOA: 03/14/2018 PCP: [name], MD    Brief Narrative:  68 y.o. male with medical history significant for lung cancer, coronary artery disease, ischemic cardiomyopathy, emphysema, and hypertension, now presenting to the emergency department with progressive debility and decline.  He is accompanied by his sister with whom he lives, and who helps care for him.  Patient had undergone chemoradiation, had progression of disease on first-line chemotherapy, and had been receiving infusions of a second line chemotherapy every 3 weeks until he elected to stop all treatment 4 months ago.  Patient has not taken any medication since that time, has asked about having his port removed, and has indicated to his sister that he is ready to die.  His sister has been supportive of his decision, but has noted a progressive decline in his overall condition in recent months, and prior to admission had found him on the floor in a puddle of urine, likely having slid out of his bed and too weak to get up.  Patient does not have any complaints at this time and confirms that he is not interested in any further treatment.  ED Course: Upon arrival to the ED, patient is found to be afebrile, saturating mid 80s on room air, and with vitals otherwise normal.  Chemistry panel features and albumin of 2.8, CBC is unremarkable, lactic acid less than 2, and serum CK 44.  EKG features a sinus rhythm and chest x-ray is notable for recurrent left upper lobe mass with underlying emphysema and otherwise clear lungs.  No acute traumatic intracranial injury or fracture is noted on head CT.  Patient was placed on supplemental oxygen, remains hemodynamically stable, is not in any apparent respiratory distress, and will be observed for further evaluation and management of progressive debility and decline with family inquiring about hospice or other options for end-of-life  care.  Assessment & Plan:   Principal Problem:   End of life care Active Problems:   Chronic systolic CHF (congestive heart failure) (HCC)   Debility   Hypertension   CAD (coronary artery disease)   Lung cancer (HCC)   Emphysema of lung (HCC)   Failure to thrive in adult   1. Debility and decline  - Presents with progressive physical debility and decline since stopping cancer treatment 4 months ago  - Palliative Care consulted at time of admit - Pt alert and oriented with insight to care. Pt mentioned "hospice" this AM - DNR  2. Lung cancer  - Patient underwent concomitant chemoradiation, had disease progression on initial chemotherapy regimen, and had then been receiving a second line regimen every 3 weeks until he elected to stop all treatment in September  - Patient confirmed to admitting team desire for no more treatment and his family noted to be supportive of his decision    3. COPD  - No cough or wheeze on admission  - Continue supplemental O2 and albuterol nebs as needed  - Stable at present  4. Ischemic cardiomyopathy  - EF 30-35% in January 2018  - Has not been on diuretics  - suspected hypovolemia at time of presentation - Appears euvolemic at present  DVT prophylaxis: Lovenox subQ Code Status: DNR Family Communication: Pt in room, family not at bedside Disposition Plan: Uncertain at this time  Consultants:     Procedures:     Antimicrobials: Anti-infectives (From admission, onward)   None  Subjective: No complaints at present. Denies sob  Objective: Vitals:   03/15/18 0416 03/15/18 0435 03/15/18 0633 03/15/18 1458  BP: 131/80  114/84 109/82  Pulse: 66  74 (!) 107  Resp: 16  16 15   Temp: 97.7 F (36.5 C)  97.8 F (36.6 C) 98.6 F (37 C)  TempSrc: Oral  Oral Oral  SpO2: 100%  100% 100%  Weight:  52.7 kg    Height:  6\' 3"  (1.905 m)      Intake/Output Summary (Last 24 hours) at 03/15/2018 1704 Last data filed at 03/15/2018  1133 Gross per 24 hour  Intake 480 ml  Output 550 ml  Net -70 ml   Filed Weights   03/15/18 0435  Weight: 52.7 kg    Examination:  General exam: Appears calm and comfortable  Respiratory system: Clear to auscultation. Respiratory effort normal. Cardiovascular system: S1 & S2 heard, RRR Gastrointestinal system: Abdomen is nondistended, soft and nontender. No organomegaly or masses felt. Normal bowel sounds heard. Central nervous system: Alert and oriented. No focal neurological deficits. Extremities: Symmetric 5 x 5 power. Skin: No rashes, lesions  Psychiatry: Judgement and insight appear normal. Mood & affect appropriate.   Data Reviewed: I have personally reviewed following labs and imaging studies  CBC: Recent Labs  Lab 03/14/18 2317  WBC 9.8  NEUTROABS 8.4*  HGB 12.3*  HCT 41.7  MCV 88.2  PLT 025   Basic Metabolic Panel: Recent Labs  Lab 03/14/18 2317  NA 136  K 4.1  CL 99  CO2 26  GLUCOSE 106*  BUN 20  CREATININE 0.97  CALCIUM 9.5   GFR: Estimated Creatinine Clearance: 53.6 mL/min (by C-G formula based on SCr of 0.97 mg/dL). Liver Function Tests: Recent Labs  Lab 03/14/18 2317  AST 18  ALT 14  ALKPHOS 103  BILITOT 0.3  PROT 7.4  ALBUMIN 2.8*   No results for input(s): LIPASE, AMYLASE in the last 168 hours. Recent Labs  Lab 03/15/18 0537  AMMONIA 17   Coagulation Profile: No results for input(s): INR, PROTIME in the last 168 hours. Cardiac Enzymes: Recent Labs  Lab 03/14/18 2317  CKTOTAL 44*   BNP (last 3 results) No results for input(s): PROBNP in the last 8760 hours. HbA1C: No results for input(s): HGBA1C in the last 72 hours. CBG: Recent Labs  Lab 03/14/18 2358  GLUCAP 83   Lipid Profile: No results for input(s): CHOL, HDL, LDLCALC, TRIG, CHOLHDL, LDLDIRECT in the last 72 hours. Thyroid Function Tests: No results for input(s): TSH, T4TOTAL, FREET4, T3FREE, THYROIDAB in the last 72 hours. Anemia Panel: No results for  input(s): VITAMINB12, FOLATE, FERRITIN, TIBC, IRON, RETICCTPCT in the last 72 hours. Sepsis Labs: Recent Labs  Lab 03/15/18 0020  LATICACIDVEN 1.96*    No results found for this or any previous visit (from the past 240 hour(s)).   Radiology Studies: Dg Chest 2 View  Result Date: 03/15/2018 CLINICAL DATA:  Acute onset of incontinence. Slid off bed; unwitnessed fall. Shortness of breath. EXAM: CHEST - 2 VIEW COMPARISON:  Chest radiograph performed 09/18/2017, and CT of the chest performed 11/27/2017 FINDINGS: There has been recurrence of the patient's left apical lung mass, now measuring approximately 8 cm in size. There is no evidence of pleural effusion or pneumothorax. Underlying emphysema is again noted. The heart is normal in size; the patient is status post median sternotomy, with evidence of prior CABG. A right-sided chest port is noted ending about the distal SVC. No acute osseous abnormalities are  seen. IMPRESSION: Recurrent left apical lung mass, now measuring approximately 8 cm in size. Underlying emphysema again noted. Lungs otherwise grossly clear. Electronically Signed   By: Garald Balding M.D.   On: 03/15/2018 01:34   Ct Head Wo Contrast  Result Date: 03/15/2018 CLINICAL DATA:  Status post fall. Acute onset of headache. EXAM: CT HEAD WITHOUT CONTRAST TECHNIQUE: Contiguous axial images were obtained from the base of the skull through the vertex without intravenous contrast. COMPARISON:  CT of the head performed 03/28/2016 FINDINGS: Brain: No evidence of acute infarction, hemorrhage, hydrocephalus, extra-axial collection or mass lesion / mass effect. Prominence of the ventricles and sulci reflects mild cortical volume loss. Mild cerebellar atrophy is noted. Mild subcortical white matter change likely reflects small vessel ischemic microangiopathy. Dense calcification is seen at the basal ganglia bilaterally. The brainstem and fourth ventricle are within normal limits. The cerebral  hemispheres demonstrate grossly normal gray-white differentiation. No mass effect or midline shift is seen. Vascular: No hyperdense vessel or unexpected calcification. Skull: There is no evidence of fracture; visualized osseous structures are unremarkable in appearance. Sinuses/Orbits: The orbits are within normal limits. The paranasal sinuses and mastoid air cells are well-aerated. Other: No significant soft tissue abnormalities are seen. IMPRESSION: 1. No evidence of traumatic intracranial injury or fracture. 2. Mild cortical volume loss and scattered small vessel ischemic microangiopathy. Electronically Signed   By: Garald Balding M.D.   On: 03/15/2018 00:01    Scheduled Meds: . enoxaparin (LOVENOX) injection  40 mg Subcutaneous Q24H  . feeding supplement (ENSURE ENLIVE)  237 mL Oral BID BM  . sodium chloride flush  3 mL Intravenous Q12H   Continuous Infusions: . sodium chloride       LOS: 0 days   Marylu Lund, MD [facility] Pager On Amion  If 7PM-7AM, please contact night-coverage 03/15/2018, 5:04 PM

## 2018-03-16 DIAGNOSIS — Z515 Encounter for palliative care: Secondary | ICD-10-CM

## 2018-03-16 DIAGNOSIS — R627 Adult failure to thrive: Secondary | ICD-10-CM | POA: Diagnosis not present

## 2018-03-16 DIAGNOSIS — Z66 Do not resuscitate: Secondary | ICD-10-CM | POA: Diagnosis not present

## 2018-03-16 DIAGNOSIS — R5381 Other malaise: Secondary | ICD-10-CM | POA: Diagnosis not present

## 2018-03-16 DIAGNOSIS — I5022 Chronic systolic (congestive) heart failure: Secondary | ICD-10-CM | POA: Diagnosis not present

## 2018-03-16 DIAGNOSIS — C3412 Malignant neoplasm of upper lobe, left bronchus or lung: Secondary | ICD-10-CM

## 2018-03-16 DIAGNOSIS — R64 Cachexia: Secondary | ICD-10-CM | POA: Diagnosis not present

## 2018-03-16 MED ORDER — LORAZEPAM 1 MG PO TABS: 1.0000 mg | ORAL_TABLET | ORAL | Status: DC | PRN

## 2018-03-16 MED ORDER — MORPHINE SULFATE (CONCENTRATE) 10 MG/0.5ML PO SOLN: 5.0000 mg | ORAL | Status: DC | PRN

## 2018-03-16 NOTE — Care Management Obs Status (Signed)
[place] NOTIFICATION   Patient Details  Name: [name] MRN: [id number] Date of Birth: [birth year]   Medicare Observation Status Notification Given:  Yes    Erenest Rasher, RN 03/16/2018, 1:45 PM

## 2018-03-16 NOTE — Consult Note (Signed)
Consultation Note Date: 03/16/2018   Patient Name: [name]  DOB: 04/16/1950  MRN: [id number]  Age / Sex: 68 y.o., male  PCP: [name], MD Referring Physician: Donne Hazel, MD  Reason for Consultation: Establishing goals of care and Psychosocial/spiritual support  HPI/Patient Profile: 68 y.o. male   admitted on 03/14/2018 with past  medical history significant for lung cancer, coronary artery disease, ischemic cardiomyopathy, emphysema, and hypertension, now presenting to the emergency department with progressive debility and decline.  Patient's sister reports psychiatric history specific to hearing voices.  He stopped taking medication many years ago.  He has never hurt himself or others in the past.  He lives at home with his sister, patient has increasing care needs at home.    Patient had undergone chemoradiation, had progression of disease on first-line chemotherapy, and had been receiving infusions of a second line chemotherapy every 3 weeks until he elected to stop all treatment 4 months ago.  He would like his Port-A-Cath removed   Patient is able to verbalize his medical history and communicates insight into the understanding that without treatment he will die.   His sister has been supportive of his decision and understands he is "ready to die"  He and his family face anticipatory care needs in light of his terminal illness.   Clinical Assessment and Goals of Care:  This NP Wadie Lessen reviewed medical records, received report from team, assessed the patient and then meet at the patient's bedside  to discuss diagnosis, prognosis, GOC, EOL wishes disposition and options.  I then spoke to his sister by phone and covered the same topics.  Concept of Hospice and Palliative Care were discussed  A discussion was had today regarding advanced directives.  Concepts specific to code status,  artifical feeding and hydration, continued IV antibiotics and rehospitalization was had.  The difference between a aggressive medical intervention path  and a palliative comfort care path for this patient at this time was had.  Values and goals of care important to patient and family were attempted to be elicited.  Patient's main focus of care is comfort quality and dignity allowing for a natural death.  Natural trajectory and expectations at EOL were discussed.  Questions and concerns addressed.   Family encouraged to call with questions or concerns.    PMT will continue to support holistically.   NEXT OF KIN    SUMMARY OF RECOMMENDATIONS    Code Status/Advance Care Planning:  DNR   Symptom Management:   Pain/dyspnea: Roxanol 5 mg p.o./sublingual every 1 hour as needed  Anxiety: Ativan 1 mg p.o./sublingual every 4 hours as needed  Palliative Prophylaxis:   Bowel Regimen, Delirium Protocol, Frequent Pain Assessment and Oral Care  Additional Recommendations (Limitations, Scope, Preferences):  Full Comfort Care  Psycho-social/Spiritual:   Desire for further Chaplaincy support:no  Additional Recommendations: Education on Hospice  Prognosis:   < 6 months  Discharge Planning: Home with Hospice      Primary Diagnoses: Present on Admission: .  Debility . Emphysema of lung ([place]) . CAD (coronary artery disease) . Lung cancer ([place]) . Hypertension . Chronic systolic CHF (congestive heart failure) ([place])   I have reviewed the medical record, interviewed the patient and family, and examined the patient. The following aspects are pertinent.  Past Medical History:  Diagnosis Date  . AAA (abdominal aortic aneurysm) ([place])   . Abdominal aortic aneurysm ([place])    AAA And bilateral common iliac artery aneurysms  . BPH (benign prostatic hyperplasia)   . CAD (coronary artery disease)    Dr Ron Parker @ [facility]   . Chronic mental illness 04/21/2011   Diagnosis unclear,  Family  provides good care  . COPD (chronic obstructive pulmonary disease) ([place]) 04/21/2011   smoking  . Drug-induced skin rash 09/05/2016  . Ejection fraction    EF 35-40%, echo, May, 2010  . Hyperlipidemia   . Hyperplastic colon polyp 04/21/2011  . Hypertension   . Preop cardiovascular exam    Cardiac clearance for major vascular surgery, May, 2013  . Pulmonary nodule    Chest CT May, 2013, 2 small pulmonary nodules, needs appropriate followup,  this CT was not ordered by the cardiology team  . PVD (peripheral vascular disease) ([place]) 04/21/2011  . Retention of urine   . Shortness of breath   . Squamous cell lung cancer, left ([place]) 06/13/2016  . Tobacco abuse    Social History   Socioeconomic History  . Marital status: Single    Spouse name: Not on file  . Number of children: Not on file  . Years of education: 78  . Highest education level: Not on file  Occupational History  . Occupation: retired  Scientific laboratory technician  . Financial resource strain: Not on file  . Food insecurity:    Worry: Not on file    Inability: Not on file  . Transportation needs:    Medical: Not on file    Non-medical: Not on file  Tobacco Use  . Smoking status: Current Every Day Smoker    Packs/day: 1.00    Years: 50.00    Pack years: 50.00    Types: Cigarettes  . Smokeless tobacco: Never Used  Substance and Sexual Activity  . Alcohol use: No  . Drug use: No  . Sexual activity: Never  Lifestyle  . Physical activity:    Days per week: Not on file    Minutes per session: Not on file  . Stress: Not on file  Relationships  . Social connections:    Talks on phone: Not on file    Gets together: Not on file    Attends religious service: Not on file    Active member of club or organization: Not on file    Attends meetings of clubs or organizations: Not on file    Relationship status: Not on file  Other Topics Concern  . Not on file  Social History Narrative  . Not on file   Family History  Problem Relation Age  of Onset  . Cancer Father        prostate  . Heart disease Father   . Diabetes Father   . Heart attack Father 75  . Arthritis Sister   . Hyperlipidemia Sister   . Hypertension Sister   . Anesthesia problems Neg Hx    Scheduled Meds: . enoxaparin (LOVENOX) injection  40 mg Subcutaneous Q24H  . feeding supplement (ENSURE ENLIVE)  237 mL Oral BID BM  . sodium chloride flush  3 mL Intravenous  Q12H   Continuous Infusions: . sodium chloride     PRN Meds:.sodium chloride, acetaminophen **OR** acetaminophen, albuterol, HYDROcodone-acetaminophen, LORazepam, morphine injection, ondansetron **OR** ondansetron (ZOFRAN) IV, polyethylene glycol, sodium chloride flush Medications Prior to Admission:  Prior to Admission medications   Medication Sig Start Date End Date Taking? Authorizing Provider  albuterol (PROVENTIL HFA;VENTOLIN HFA) 108 (90 Base) MCG/ACT inhaler Inhale 1-2 puffs into the lungs every 6 (six) hours as needed for wheezing or shortness of breath. Patient not taking: Reported on 10/02/2017 09/18/17   Varney Biles, MD  dexamethasone (DECADRON) 4 MG tablet Take 2 tabs twice a day the day before, day of, and day after each cycle of chemo Patient not taking: Reported on 03/15/2018 07/23/17   Maryanna Shape, NP  ondansetron (ZOFRAN ODT) 4 MG disintegrating tablet Take 1 tablet (4 mg total) by mouth every 8 (eight) hours as needed for nausea or vomiting. Patient not taking: Reported on 09/18/2017 03/17/17   Rodell Perna A, PA-C  potassium chloride (K-DUR) 10 MEQ tablet Take 1 tablet (10 mEq total) by mouth daily. Patient not taking: Reported on 11/13/2017 08/21/17   Gardenia Phlegm, NP  prochlorperazine (COMPAZINE) 10 MG tablet Take 1 tablet (10 mg total) by mouth every 6 (six) hours as needed for nausea or vomiting. Patient not taking: Reported on 09/18/2017 06/26/16   Curt Bears, MD  psyllium (METAMUCIL) 58.6 % packet Take 1 packet by mouth daily. Patient not taking: Reported on  11/13/2017 03/17/17   Rodell Perna A, PA-C  rosuvastatin (CRESTOR) 40 MG tablet TAKE 1 TABLET BY MOUTH EVERY DAY Patient not taking: Reported on 03/15/2018 01/14/18   [name], MD  sucralfate (CARAFATE) 1 GM/10ML suspension Take 10 mLs (1 g total) by mouth 4 (four) times daily -  with meals and at bedtime. Patient not taking: Reported on 09/18/2017 07/02/16   Maryanna Shape, NP   Allergies  Allergen Reactions  . Ace Inhibitors Itching   Review of Systems  Constitutional: Positive for unexpected weight change.  All other systems reviewed and are negative.   Physical Exam Constitutional:      Appearance: He is cachectic.  Cardiovascular:     Rate and Rhythm: Normal rate and regular rhythm.     Heart sounds: Normal heart sounds.  Pulmonary:     Breath sounds: Decreased breath sounds present.  Skin:    General: Skin is warm and dry.  Neurological:     Mental Status: He is alert.     Vital Signs: BP 121/80   Pulse 74   Temp 97.7 F (36.5 C) (Oral)   Resp 14   Ht 6\' 3"  (1.905 m)   Wt 52.7 kg   SpO2 99%   BMI 14.52 kg/m  Pain Scale: 0-10   Pain Score: Asleep   SpO2: SpO2: 99 % O2 Device:SpO2: 99 % O2 Flow Rate: .O2 Flow Rate (L/min): 2 L/min  IO: Intake/output summary:   Intake/Output Summary (Last 24 hours) at 03/16/2018 9924 Last data filed at 03/16/2018 0615 Gross per 24 hour  Intake 400 ml  Output 975 ml  Net -575 ml    LBM: Last BM Date: 03/13/18 Baseline Weight: Weight: 52.7 kg Most recent weight: Weight: 52.7 kg     Palliative Assessment/Data: 40 %    Discussed Dr Wyline Copas  Time In: 0700 Time Out: 0815 Time Total: 75 minutes Greater than 50%  of this time was spent counseling and coordinating care related to the above assessment and plan.  Signed by: Mary Larach, NP   Please contact Palliative Medicine Team phone at [phone] for questions and concerns.  For individual provider: See Amion             

## 2018-03-16 NOTE — Care Management Note (Addendum)
Case Management Note  Patient Details  Name: [name] MRN: [id number] Date of Birth: 12/04/1950  Subjective/Objective:    CHF, lung cancer, failure to thrive                Action/Plan: NCM spoke to pt and gave permission to speak to sister, [name] # [phone]. States he lives at home with sister. Contacted sister via phone. Offered choice for Home Hospice/CMS list provided and placed on chart. Sister requested [facility]. Contacted [facility] with new referral. Plan dc home tomorrow.   Expected Discharge Date:  03/17/2018              Expected Discharge Plan:  Home w Hospice Care  In-House Referral:  NA  Discharge planning Services  CM Consult  Post Acute Care Choice:  Hospice Choice offered to:  Sibling  DME Arranged:  Other see comment DME Agency:  [place]:  RN [facility] Agency:  [facility]  Status of Service:  Completed, signed off  If discussed at [place] of Stay Meetings, dates discussed:    Additional Comments:  Erenest Rasher, RN 03/16/2018, 2:12 PM

## 2018-03-16 NOTE — Progress Notes (Signed)
PROGRESS NOTE    [name]  GUR:[id number] DOB: 1950/10/04 DOA: 03/14/2018 PCP: [name], MD    Brief Narrative:  68 y.o. male with medical history significant for lung cancer, coronary artery disease, ischemic cardiomyopathy, emphysema, and hypertension, now presenting to the emergency department with progressive debility and decline.  He is accompanied by his sister with whom he lives, and who helps care for him.  Patient had undergone chemoradiation, had progression of disease on first-line chemotherapy, and had been receiving infusions of a second line chemotherapy every 3 weeks until he elected to stop all treatment 4 months ago.  Patient has not taken any medication since that time, has asked about having his port removed, and has indicated to his sister that he is ready to die.  His sister has been supportive of his decision, but has noted a progressive decline in his overall condition in recent months, and prior to admission had found him on the floor in a puddle of urine, likely having slid out of his bed and too weak to get up.  Patient does not have any complaints at this time and confirms that he is not interested in any further treatment.  ED Course: Upon arrival to the ED, patient is found to be afebrile, saturating mid 80s on room air, and with vitals otherwise normal.  Chemistry panel features and albumin of 2.8, CBC is unremarkable, lactic acid less than 2, and serum CK 44.  EKG features a sinus rhythm and chest x-ray is notable for recurrent left upper lobe mass with underlying emphysema and otherwise clear lungs.  No acute traumatic intracranial injury or fracture is noted on head CT.  Patient was placed on supplemental oxygen, remains hemodynamically stable, is not in any apparent respiratory distress, and will be observed for further evaluation and management of progressive debility and decline with family inquiring about hospice or other options for end-of-life  care.  Assessment & Plan:   Principal Problem:   End of life care Active Problems:   Chronic systolic CHF (congestive heart failure) (HCC)   Debility   Hypertension   CAD (coronary artery disease)   Lung cancer (HCC)   Emphysema of lung (H[facility])   Failure to thrive in adult   1. Debility and decline  - Presents with progressive physical debility and decline since stopping cancer treatment 4 months ago  - Palliative Care consulted, appreciate input. - Pt alert and oriented with insight to care. Pt mentioned desire for "hospice." SW consulted for placement - DNR  2. Lung cancer  - Patient underwent concomitant chemoradiation, had disease progression on initial chemotherapy regimen, and had then been receiving a second line regimen every 3 weeks until he elected to stop all treatment in September  - Patient confirmed to admitting team desire for no more treatment and his family noted to be supportive of his decision    3. COPD  - No cough or wheeze on admission  - Continue supplemental O2 and albuterol nebs as needed  - Stable at present  4. Ischemic cardiomyopathy  - EF 30-35% in January 2018  - Has not been on diuretics  - suspected hypovolemia at time of presentation - Appears euvolemic at present  DVT prophylaxis: Lovenox subQ Code Status: DNR Family Communication: Pt in room, family not at bedside Disposition Plan: Uncertain at this time  Consultants:     Procedures:     Antimicrobials: Anti-infectives (From admission, onward)   None  Subjective: Without sob or wheezing  Objective: Vitals:   03/15/18 0633 03/15/18 1458 03/15/18 2017 03/16/18 0441  BP: 114/84 109/82 101/83 121/80  Pulse: 74 (!) 107 92 74  Resp: 16 15 14 14   Temp: 97.8 F (36.6 C) 98.6 F (37 C) 98.4 F (36.9 C) 97.7 F (36.5 C)  TempSrc: Oral Oral Oral Oral  SpO2: 100% 100% 98% 99%  Weight:      Height:        Intake/Output Summary (Last 24 hours) at 03/16/2018  1306 Last data filed at 03/16/2018 0925 Gross per 24 hour  Intake 400 ml  Output 975 ml  Net -575 ml   Filed Weights   03/15/18 0435  Weight: 52.7 kg    Examination: General exam: Awake, laying in bed, in nad Respiratory system: Normal respiratory effort, no wheezing  Data Reviewed: I have personally reviewed following labs and imaging studies  CBC: Recent Labs  Lab 03/14/18 2317  WBC 9.8  NEUTROABS 8.4*  HGB 12.3*  HCT 41.7  MCV 88.2  PLT 098   Basic Metabolic Panel: Recent Labs  Lab 03/14/18 2317  NA 136  K 4.1  CL 99  CO2 26  GLUCOSE 106*  BUN 20  CREATININE 0.97  CALCIUM 9.5   GFR: Estimated Creatinine Clearance: 53.6 mL/min (by C-G formula based on SCr of 0.97 mg/dL). Liver Function Tests: Recent Labs  Lab 03/14/18 2317  AST 18  ALT 14  ALKPHOS 103  BILITOT 0.3  PROT 7.4  ALBUMIN 2.8*   No results for input(s): LIPASE, AMYLASE in the last 168 hours. Recent Labs  Lab 03/15/18 0537  AMMONIA 17   Coagulation Profile: No results for input(s): INR, PROTIME in the last 168 hours. Cardiac Enzymes: Recent Labs  Lab 03/14/18 2317  CKTOTAL 44*   BNP (last 3 results) No results for input(s): PROBNP in the last 8760 hours. HbA1C: No results for input(s): HGBA1C in the last 72 hours. CBG: Recent Labs  Lab 03/14/18 2358  GLUCAP 83   Lipid Profile: No results for input(s): CHOL, HDL, LDLCALC, TRIG, CHOLHDL, LDLDIRECT in the last 72 hours. Thyroid Function Tests: No results for input(s): TSH, T4TOTAL, FREET4, T3FREE, THYROIDAB in the last 72 hours. Anemia Panel: No results for input(s): VITAMINB12, FOLATE, FERRITIN, TIBC, IRON, RETICCTPCT in the last 72 hours. Sepsis Labs: Recent Labs  Lab 03/15/18 0020  LATICACIDVEN 1.96*    No results found for this or any previous visit (from the past 240 hour(s)).   Radiology Studies: Dg Chest 2 View  Result Date: 03/15/2018 CLINICAL DATA:  Acute onset of incontinence. Slid off bed; unwitnessed  fall. Shortness of breath. EXAM: CHEST - 2 VIEW COMPARISON:  Chest radiograph performed 09/18/2017, and CT of the chest performed 11/27/2017 FINDINGS: There has been recurrence of the patient's left apical lung mass, now measuring approximately 8 cm in size. There is no evidence of pleural effusion or pneumothorax. Underlying emphysema is again noted. The heart is normal in size; the patient is status post median sternotomy, with evidence of prior CABG. A right-sided chest port is noted ending about the distal SVC. No acute osseous abnormalities are seen. IMPRESSION: Recurrent left apical lung mass, now measuring approximately 8 cm in size. Underlying emphysema again noted. Lungs otherwise grossly clear. Electronically Signed   By: Garald Balding M.D.   On: 03/15/2018 01:34   Ct Head Wo Contrast  Result Date: 03/15/2018 CLINICAL DATA:  Status post fall. Acute onset of headache. EXAM: CT HEAD WITHOUT  CONTRAST TECHNIQUE: Contiguous axial images were obtained from the base of the skull through the vertex without intravenous contrast. COMPARISON:  CT of the head performed 03/28/2016 FINDINGS: Brain: No evidence of acute infarction, hemorrhage, hydrocephalus, extra-axial collection or mass lesion / mass effect. Prominence of the ventricles and sulci reflects mild cortical volume loss. Mild cerebellar atrophy is noted. Mild subcortical white matter change likely reflects small vessel ischemic microangiopathy. Dense calcification is seen at the basal ganglia bilaterally. The brainstem and fourth ventricle are within normal limits. The cerebral hemispheres demonstrate grossly normal gray-white differentiation. No mass effect or midline shift is seen. Vascular: No hyperdense vessel or unexpected calcification. Skull: There is no evidence of fracture; visualized osseous structures are unremarkable in appearance. Sinuses/Orbits: The orbits are within normal limits. The paranasal sinuses and mastoid air cells are  well-aerated. Other: No significant soft tissue abnormalities are seen. IMPRESSION: 1. No evidence of traumatic intracranial injury or fracture. 2. Mild cortical volume loss and scattered small vessel ischemic microangiopathy. Electronically Signed   By: Garald Balding M.D.   On: 03/15/2018 00:01    Scheduled Meds: . feeding supplement (ENSURE ENLIVE)  237 mL Oral BID BM  . sodium chloride flush  3 mL Intravenous Q12H   Continuous Infusions: . sodium chloride       LOS: 0 days   Marylu Lund, MD [facility] Pager On Amion  If 7PM-7AM, please contact night-coverage 03/16/2018, 1:06 PM

## 2018-03-16 NOTE — Progress Notes (Addendum)
Hospice and [facility]) Hospital Liaison:  Received request from [place] for patient/family interest in [facility] services at home after discharge. Chart and patient information reviewed by [facility] physician and eligibility has been confirmed.    Spoke with patient's sister [name] by phone initiate education related to hospice philosophy, services and team approach to care. [name] verbalized understanding of information given.  Per discussion, plan is for discharge to home 03/17/18.   Please send signed and completed DNR form home with patient/family.    Patient will need prescriptions for discharge comfort medications.  DME needs discussed. Per Gloria's request, RW, BSC, WC and oxygen setup have been ordered from [place] for delivery to home. Home address has been verified and is correct in the chart. Sister [name] is contact for DME delivery. Her number is [phone].  [facility] aware of the above and will contact [name] to arrange first visit.  Please fax completed discharge summary to [place] at [phone] when final.  Please notify [facility] when patient is ready to leave the unit at discharge. (Call [phone] between 8:30 and 5pm. Call [phone] after 5pm.)  Please call with hospice related questions. Thank you for this referral.  Erling Conte, [place] Hospital Liaison [place] are listed on AMION.

## 2018-03-17 ENCOUNTER — Telehealth: Payer: Self-pay | Admitting: Internal Medicine

## 2018-03-17 ENCOUNTER — Telehealth: Payer: Self-pay | Admitting: Medical Oncology

## 2018-03-17 DIAGNOSIS — M2550 Pain in unspecified joint: Secondary | ICD-10-CM | POA: Diagnosis not present

## 2018-03-17 DIAGNOSIS — Z515 Encounter for palliative care: Secondary | ICD-10-CM | POA: Diagnosis not present

## 2018-03-17 DIAGNOSIS — R5381 Other malaise: Secondary | ICD-10-CM | POA: Diagnosis not present

## 2018-03-17 DIAGNOSIS — Z7401 Bed confinement status: Secondary | ICD-10-CM | POA: Diagnosis not present

## 2018-03-17 DIAGNOSIS — I5022 Chronic systolic (congestive) heart failure: Secondary | ICD-10-CM | POA: Diagnosis not present

## 2018-03-17 MED ORDER — ONDANSETRON HCL 4 MG PO TABS: 4.0000 mg | ORAL_TABLET | Freq: Four times a day (QID) | ORAL | 0 refills | Status: AC | PRN

## 2018-03-17 MED ORDER — POLYETHYLENE GLYCOL 3350 17 G PO PACK: 17.0000 g | PACK | Freq: Every day | ORAL | 0 refills | Status: AC | PRN

## 2018-03-17 MED ORDER — HYDROCODONE-ACETAMINOPHEN 5-325 MG PO TABS: 1.0000 | ORAL_TABLET | Freq: Four times a day (QID) | ORAL | 0 refills | Status: AC | PRN

## 2018-03-17 MED ORDER — LORAZEPAM 1 MG PO TABS: 1.0000 mg | ORAL_TABLET | Freq: Four times a day (QID) | ORAL | 0 refills | Status: AC | PRN

## 2018-03-17 NOTE — Telephone Encounter (Signed)
Informed Amy that PCP will be the attending.

## 2018-03-17 NOTE — Plan of Care (Signed)
Plan of care reviewed. [name] condition is stable enough, though declining per MD note, for him to return home with Hospice care.

## 2018-03-17 NOTE — Telephone Encounter (Signed)
Copied from [place] [id number]. Topic: General - Other >> Mar 17, 2018  9:28 AM Yvette Rack wrote: Reason for CRM: Referral specialist Amy Lucas Mallow [phone] calling stating that pt is in the hospital and pt is wanting hospice and want to know if Dr [name] will be his attending provider

## 2018-03-17 NOTE — Progress Notes (Signed)
Pt discharged home with Hospice care via [facility].  Stable at time of discharge.  Discharge instructions reviewed and copy provided to patient re: meds and follow-up care.

## 2018-03-17 NOTE — Discharge Summary (Signed)
Physician Discharge Summary  [name] LOV:[id number] DOB: 05-Jun-1950 DOA: 03/14/2018  PCP: [name], MD  Admit date: 03/14/2018 Discharge date: 03/17/2018  Admitted From: Home Disposition:  Home  Recommendations for Outpatient Follow-up:  1. Follow up with hospice services    Discharge Condition:Stable CODE STATUS:DNR Diet recommendation: Regular   Brief/Interim Summary: 68 y.o.malewith medical history significant forlung cancer, coronary artery disease, ischemic cardiomyopathy, emphysema, and hypertension, now presenting to the emergency department with progressive debility and decline. He is accompanied by his sister with whom he lives, and who helps care for him. Patient had undergone chemoradiation, had progression of disease on first-line chemotherapy, and had been receiving infusions of a second line chemotherapy every 3 weeks until he elected to stop all treatment 4 months ago. Patient has not taken any medication since that time, has asked about having his port removed, and has indicated to his sister that he is ready to die. His sister has been supportive of his decision, but has noted a progressive decline in his overall condition in recent months, and prior to admission had found him on the floor in a puddle of urine, likely having slid out of his bed and too weak to get up. Patient does not have any complaints at this time and confirms that he is not interested in any further treatment.  ED Course:Upon arrival to the ED, patient is found to beafebrile, saturating mid 80s on room air, and with vitals otherwise normal. Chemistry panel features and albumin of 2.8, CBC is unremarkable, lactic acid less than 2, and serum CK 44. EKG features a sinus rhythm and chest x-ray is notable for recurrent left upper lobe mass with underlying emphysema and otherwise clear lungs. No acute traumatic intracranial injury or fracture is noted on head CT. Patient was placed on  supplemental oxygen, remains hemodynamically stable, is not in any apparent respiratory distress, and will be observed for further evaluation and management of progressive debility and decline with family inquiring about hospice or other options for end-of-life care.  Discharge Diagnoses:  Principal Problem:   End of life care Active Problems:   Chronic systolic CHF (congestive heart failure) ([place])   Debility   Hypertension   CAD (coronary artery disease)   Lung cancer (HCC)   Emphysema of lung (HCC)   Failure to thrive in adult   Malignant cachexia [facility])   Palliative care by specialist   DNR (do not resuscitate)   1.Debility and decline -Presents with progressive physical debility and decline since stopping cancer treatment 4 months ago - Palliative Care consulted, appreciate input. - Pt alert and oriented with insight to care. Pt mentioned desire for "hospice." SW consulted for placement - DNR - Plan for discharge to hospice - Will provide limited anxiolytics and narcotics for palliative purposes. [place] reviewed. No recent controlled substances listed.  2.Lung cancer -Patient underwent concomitant chemoradiation, had disease progression on initial chemotherapy regimen, and had then been receiving a second line regimen every 3 weeks until he elected to stop all treatment in September -Patient confirmed to admitting team desire for no more treatment and his family noted to be supportive of his decision  3.COPD -No cough or wheeze on admission -Continue supplemental O2 and albuterol nebs as needed - Stable at present  4.Ischemic cardiomyopathy -EF 30-35% in January 2018 -Has not been on diuretics -suspected hypovolemia at time of presentation - Appears euvolemic at present   Discharge Instructions   Allergies as of 03/17/2018  Reactions   Ace Inhibitors Itching      Medication List    STOP taking these medications   dexamethasone 4  MG tablet Commonly known as:  DECADRON   ondansetron 4 MG disintegrating tablet Commonly known as:  ZOFRAN ODT   potassium chloride 10 MEQ tablet Commonly known as:  K-DUR   prochlorperazine 10 MG tablet Commonly known as:  COMPAZINE   psyllium 58.6 % packet Commonly known as:  METAMUCIL   rosuvastatin 40 MG tablet Commonly known as:  CRESTOR   sucralfate 1 GM/10ML suspension Commonly known as:  CARAFATE     TAKE these medications   albuterol 108 (90 Base) MCG/ACT inhaler Commonly known as:  PROVENTIL HFA;VENTOLIN HFA Inhale 1-2 puffs into the lungs every 6 (six) hours as needed for wheezing or shortness of breath.   HYDROcodone-acetaminophen 5-325 MG tablet Commonly known as:  NORCO/VICODIN Take 1-2 tablets by mouth every 6 (six) hours as needed for moderate pain.   LORazepam 1 MG tablet Commonly known as:  ATIVAN Take 1 tablet (1 mg total) by mouth every 6 (six) hours as needed for anxiety or sleep.   ondansetron 4 MG tablet Commonly known as:  ZOFRAN Take 1 tablet (4 mg total) by mouth every 6 (six) hours as needed for nausea.   polyethylene glycol packet Commonly known as:  MIRALAX / GLYCOLAX Take 17 g by mouth daily as needed for mild constipation.      Follow-up Information    [facility].   Specialty:  Hospice and Palliative Medicine Why:  Home Hospice RN-will call to arrange initial visit Contact information: [place] [id number] [phone]          Allergies  Allergen Reactions  . Ace Inhibitors Itching    Consultations:  Palliative Care  Procedures/Studies: Dg Chest 2 View  Result Date: 03/15/2018 CLINICAL DATA:  Acute onset of incontinence. Slid off bed; unwitnessed fall. Shortness of breath. EXAM: CHEST - 2 VIEW COMPARISON:  Chest radiograph performed 09/18/2017, and CT of the chest performed 11/27/2017 FINDINGS: There has been recurrence of the patient's left apical lung mass, now measuring  approximately 8 cm in size. There is no evidence of pleural effusion or pneumothorax. Underlying emphysema is again noted. The heart is normal in size; the patient is status post median sternotomy, with evidence of prior CABG. A right-sided chest port is noted ending about the distal SVC. No acute osseous abnormalities are seen. IMPRESSION: Recurrent left apical lung mass, now measuring approximately 8 cm in size. Underlying emphysema again noted. Lungs otherwise grossly clear. Electronically Signed   By: Garald Balding M.D.   On: 03/15/2018 01:34   Ct Head Wo Contrast  Result Date: 03/15/2018 CLINICAL DATA:  Status post fall. Acute onset of headache. EXAM: CT HEAD WITHOUT CONTRAST TECHNIQUE: Contiguous axial images were obtained from the base of the skull through the vertex without intravenous contrast. COMPARISON:  CT of the head performed 03/28/2016 FINDINGS: Brain: No evidence of acute infarction, hemorrhage, hydrocephalus, extra-axial collection or mass lesion / mass effect. Prominence of the ventricles and sulci reflects mild cortical volume loss. Mild cerebellar atrophy is noted. Mild subcortical white matter change likely reflects small vessel ischemic microangiopathy. Dense calcification is seen at the basal ganglia bilaterally. The brainstem and fourth ventricle are within normal limits. The cerebral hemispheres demonstrate grossly normal gray-white differentiation. No mass effect or midline shift is seen. Vascular: No hyperdense vessel or unexpected calcification. Skull: There is no evidence  of fracture; visualized osseous structures are unremarkable in appearance. Sinuses/Orbits: The orbits are within normal limits. The paranasal sinuses and mastoid air cells are well-aerated. Other: No significant soft tissue abnormalities are seen. IMPRESSION: 1. No evidence of traumatic intracranial injury or fracture. 2. Mild cortical volume loss and scattered small vessel ischemic microangiopathy. Electronically  Signed   By: Garald Balding M.D.   On: 03/15/2018 00:01     Subjective: Eager to go home  Discharge Exam: Vitals:   03/16/18 2015 03/17/18 0610  BP: 113/69 128/89  Pulse: 95 98  Resp: 15 15  Temp: 98.7 F (37.1 C) 98.3 F (36.8 C)  SpO2: 97% 100%   Vitals:   03/16/18 0441 03/16/18 1413 03/16/18 2015 03/17/18 0610  BP: 121/80 109/77 113/69 128/89  Pulse: 74 96 95 98  Resp: 14 14 15 15   Temp: 97.7 F (36.5 C)  98.7 F (37.1 C) 98.3 F (36.8 C)  TempSrc: Oral  Oral   SpO2: 99% 100% 97% 100%  Weight:      Height:        General: Pt is alert, awake, not in acute distress Cardiovascular: RRR, S1/S2 +, no rubs, no gallops Respiratory: CTA bilaterally, no wheezing, no rhonchi Abdominal: Soft, NT, ND, bowel sounds + Extremities: no edema, no cyanosis   The results of significant diagnostics from this hospitalization (including imaging, microbiology, ancillary and laboratory) are listed below for reference.     Microbiology: No results found for this or any previous visit (from the past 240 hour(s)).   Labs: BNP (last 3 results) Recent Labs    09/18/17 1232  BNP 25.3   Basic Metabolic Panel: Recent Labs  Lab 03/14/18 2317  NA 136  K 4.1  CL 99  CO2 26  GLUCOSE 106*  BUN 20  CREATININE 0.97  CALCIUM 9.5   Liver Function Tests: Recent Labs  Lab 03/14/18 2317  AST 18  ALT 14  ALKPHOS 103  BILITOT 0.3  PROT 7.4  ALBUMIN 2.8*   No results for input(s): LIPASE, AMYLASE in the last 168 hours. Recent Labs  Lab 03/15/18 0537  AMMONIA 17   CBC: Recent Labs  Lab 03/14/18 2317  WBC 9.8  NEUTROABS 8.4*  HGB 12.3*  HCT 41.7  MCV 88.2  PLT 308   Cardiac Enzymes: Recent Labs  Lab 03/14/18 2317  CKTOTAL 44*   BNP: Invalid input(s): POCBNP CBG: Recent Labs  Lab 03/14/18 2358  GLUCAP 83   D-Dimer No results for input(s): DDIMER in the last 72 hours. Hgb A1c No results for input(s): HGBA1C in the last 72 hours. Lipid Profile No  results for input(s): CHOL, HDL, LDLCALC, TRIG, CHOLHDL, LDLDIRECT in the last 72 hours. Thyroid function studies No results for input(s): TSH, T4TOTAL, T3FREE, THYROIDAB in the last 72 hours.  Invalid input(s): FREET3 Anemia work up No results for input(s): VITAMINB12, FOLATE, FERRITIN, TIBC, IRON, RETICCTPCT in the last 72 hours. Urinalysis    Component Value Date/Time   COLORURINE YELLOW 03/15/2018 0500   APPEARANCEUR CLEAR 03/15/2018 0500   LABSPEC 1.015 03/15/2018 0500   PHURINE 6.0 03/15/2018 0500   GLUCOSEU NEGATIVE 03/15/2018 0500   GLUCOSEU NEGATIVE 05/28/2014 1146   HGBUR NEGATIVE 03/15/2018 0500   BILIRUBINUR NEGATIVE 03/15/2018 0500   KETONESUR NEGATIVE 03/15/2018 0500   PROTEINUR NEGATIVE 03/15/2018 0500   UROBILINOGEN 0.2 05/28/2014 1146   NITRITE NEGATIVE 03/15/2018 0500   LEUKOCYTESUR NEGATIVE 03/15/2018 0500   Sepsis Labs Invalid input(s): PROCALCITONIN,  WBC,  [place] [facility]  No results found for this or any previous visit (from the past 240 hour(s)).  Time spent: 30 min  SIGNED:   Marylu Lund, MD  [facility] 03/17/2018, 11:04 AM  If 7PM-7AM, please contact night-coverage

## 2018-03-17 NOTE — Telephone Encounter (Addendum)
Pt in hospital to be discharged to hospice. Lung mass "now 8 cm", pt declining.

## 2018-03-18 ENCOUNTER — Telehealth: Payer: Self-pay | Admitting: *Deleted

## 2018-03-18 DIAGNOSIS — J449 Chronic obstructive pulmonary disease, unspecified: Secondary | ICD-10-CM | POA: Diagnosis not present

## 2018-03-18 DIAGNOSIS — E43 Unspecified severe protein-calorie malnutrition: Secondary | ICD-10-CM | POA: Diagnosis not present

## 2018-03-18 DIAGNOSIS — C3412 Malignant neoplasm of upper lobe, left bronchus or lung: Secondary | ICD-10-CM | POA: Diagnosis not present

## 2018-03-18 DIAGNOSIS — I25119 Atherosclerotic heart disease of native coronary artery with unspecified angina pectoris: Secondary | ICD-10-CM | POA: Diagnosis not present

## 2018-03-18 DIAGNOSIS — I429 Cardiomyopathy, unspecified: Secondary | ICD-10-CM | POA: Diagnosis not present

## 2018-03-18 DIAGNOSIS — J9610 Chronic respiratory failure, unspecified whether with hypoxia or hypercapnia: Secondary | ICD-10-CM | POA: Diagnosis not present

## 2018-03-18 NOTE — Telephone Encounter (Signed)
Pt was on TCM report admitted 03/14/18 for progressive physical debility and decline since stopping cancer treatment 4 months ago. Pt D/c 03/17/18 to Hospice and Palliative Medicine for End of life care.Marland KitchenJohny Coleman

## 2018-03-27 ENCOUNTER — Telehealth: Payer: Self-pay | Admitting: Internal Medicine

## 2018-03-27 NOTE — Telephone Encounter (Signed)
Spoke with sister. She is not sure what the form is or what information is needed on it. She states patient is unable to come in for a OV. LOV: 05/2017  She is going to have them fax the form over. Please advise, Thank you.

## 2018-03-27 NOTE — Telephone Encounter (Signed)
Copied from [place] [id number]. Topic: General - Other >> Mar 27, 2018  9:41 AM Keene Breath wrote: Reason for CRM: Patient's sister called to request that the doctor or nurse fill out a 3050 Form for Assistance with daily activity from the [employer], who requires this information.  Patient's sister was not sure of the exact name of the company but the phone number is [phone].  Please call sister back [name]) to advise and see if this form can be filled out by the doctor.  CB# [phone]

## 2018-03-30 ENCOUNTER — Encounter (HOSPITAL_COMMUNITY): Payer: Self-pay

## 2018-03-30 ENCOUNTER — Other Ambulatory Visit: Payer: Self-pay

## 2018-03-30 ENCOUNTER — Emergency Department (HOSPITAL_COMMUNITY)

## 2018-03-30 ENCOUNTER — Emergency Department (HOSPITAL_COMMUNITY)
Admission: EM | Admit: 2018-03-30 | Discharge: 2018-03-30 | Disposition: A | Discharge status: Home / Self Care | Attending: Emergency Medicine | Admitting: Emergency Medicine

## 2018-03-30 DIAGNOSIS — I10 Essential (primary) hypertension: Secondary | ICD-10-CM | POA: Diagnosis not present

## 2018-03-30 DIAGNOSIS — J209 Acute bronchitis, unspecified: Secondary | ICD-10-CM | POA: Diagnosis not present

## 2018-03-30 DIAGNOSIS — J449 Chronic obstructive pulmonary disease, unspecified: Secondary | ICD-10-CM | POA: Diagnosis not present

## 2018-03-30 DIAGNOSIS — Z951 Presence of aortocoronary bypass graft: Secondary | ICD-10-CM | POA: Diagnosis not present

## 2018-03-30 DIAGNOSIS — F17210 Nicotine dependence, cigarettes, uncomplicated: Secondary | ICD-10-CM | POA: Diagnosis not present

## 2018-03-30 DIAGNOSIS — C3490 Malignant neoplasm of unspecified part of unspecified bronchus or lung: Secondary | ICD-10-CM | POA: Insufficient documentation

## 2018-03-30 DIAGNOSIS — R0602 Shortness of breath: Secondary | ICD-10-CM | POA: Diagnosis not present

## 2018-03-30 DIAGNOSIS — Z85118 Personal history of other malignant neoplasm of bronchus and lung: Secondary | ICD-10-CM | POA: Diagnosis not present

## 2018-03-30 DIAGNOSIS — Z95828 Presence of other vascular implants and grafts: Secondary | ICD-10-CM | POA: Diagnosis not present

## 2018-03-30 DIAGNOSIS — R6883 Chills (without fever): Secondary | ICD-10-CM | POA: Diagnosis present

## 2018-03-30 DIAGNOSIS — M2550 Pain in unspecified joint: Secondary | ICD-10-CM | POA: Diagnosis not present

## 2018-03-30 DIAGNOSIS — R509 Fever, unspecified: Secondary | ICD-10-CM | POA: Diagnosis not present

## 2018-03-30 DIAGNOSIS — Z7401 Bed confinement status: Secondary | ICD-10-CM | POA: Diagnosis not present

## 2018-03-30 DIAGNOSIS — R0902 Hypoxemia: Secondary | ICD-10-CM | POA: Diagnosis not present

## 2018-03-30 DIAGNOSIS — I2510 Atherosclerotic heart disease of native coronary artery without angina pectoris: Secondary | ICD-10-CM | POA: Diagnosis not present

## 2018-03-30 DIAGNOSIS — R001 Bradycardia, unspecified: Secondary | ICD-10-CM | POA: Diagnosis not present

## 2018-03-30 LAB — CBC WITH DIFFERENTIAL/PLATELET
Abs Immature Granulocytes: 0.14 10*3/uL — ABNORMAL HIGH (ref 0.00–0.07)
Basophils Absolute: 0 10*3/uL (ref 0.0–0.1)
Basophils Relative: 0 %
Eosinophils Absolute: 0.1 10*3/uL (ref 0.0–0.5)
Eosinophils Relative: 1 %
HEMATOCRIT: 34.5 % — AB (ref 39.0–52.0)
Hemoglobin: 10.4 g/dL — ABNORMAL LOW (ref 13.0–17.0)
Immature Granulocytes: 1 %
Lymphocytes Relative: 7 %
Lymphs Abs: 0.7 10*3/uL (ref 0.7–4.0)
MCH: 26.6 pg (ref 26.0–34.0)
MCHC: 30.1 g/dL (ref 30.0–36.0)
MCV: 88.2 fL (ref 80.0–100.0)
Monocytes Absolute: 0.7 10*3/uL (ref 0.1–1.0)
Monocytes Relative: 7 %
Neutro Abs: 8.6 10*3/uL — ABNORMAL HIGH (ref 1.7–7.7)
Neutrophils Relative %: 84 %
Platelets: 288 10*3/uL (ref 150–400)
RBC: 3.91 MIL/uL — ABNORMAL LOW (ref 4.22–5.81)
RDW: 18.3 % — ABNORMAL HIGH (ref 11.5–15.5)
WBC: 10.2 10*3/uL (ref 4.0–10.5)
nRBC: 0 % (ref 0.0–0.2)

## 2018-03-30 LAB — BASIC METABOLIC PANEL
Anion gap: 8 (ref 5–15)
BUN: 21 mg/dL (ref 8–23)
CO2: 27 mmol/L (ref 22–32)
Calcium: 9.4 mg/dL (ref 8.9–10.3)
Chloride: 98 mmol/L (ref 98–111)
Creatinine, Ser: 0.84 mg/dL (ref 0.61–1.24)
GFR calc Af Amer: >60 mL/min (ref >60)
GFR calc non Af Amer: >60 mL/min (ref >60)
Glucose, Bld: 107 mg/dL — ABNORMAL HIGH (ref 70–99)
Potassium: 3.6 mmol/L (ref 3.5–5.1)
Sodium: 133 mmol/L — ABNORMAL LOW (ref 135–145)

## 2018-03-30 MED ORDER — HEPARIN SOD (PORK) LOCK FLUSH 100 UNIT/ML IV SOLN
500.0000 [IU] | Freq: Once | INTRAVENOUS | Status: AC
  Administered 2018-03-30: 500 [IU]
  Filled 2018-03-30: qty 5

## 2018-03-30 MED ORDER — AZITHROMYCIN 250 MG PO TABS: 250.0000 mg | ORAL_TABLET | Freq: Every day | ORAL | 0 refills | Status: AC

## 2018-03-30 MED ORDER — SODIUM CHLORIDE 0.9 % IV BOLUS
1000.0000 mL | Freq: Once | INTRAVENOUS | Status: AC
  Administered 2018-03-30: 1000 mL via INTRAVENOUS

## 2018-03-30 NOTE — Progress Notes (Addendum)
[facility] ED visit at [place] visited pt at bedside with pt and sister present. Pt arrived with complaints of fever/chills however no temperature present with stable labs reported by the nurse (Maddie). Pt treated with IV fluids via -port-a-cath and actively being discharged home via [facility]. Dressing changed to port-a-cath with IV discontinued. No other complaints or request at this time.   Communicated PCG: pt and sister Communicated IDG: nurse (Maddie) and Jen Mow, NP  Raina Mina, RN, [facility] Liaison [place] are on AMION

## 2018-03-30 NOTE — ED Notes (Signed)
[facility] called for transport home

## 2018-03-30 NOTE — ED Triage Notes (Signed)
Per EMS: Pt was at home trying to light a cigarette, pt was unable to light it bc he states he was drooling.  Pt c/o of fidgeting too much when walking across the house.  Pt c/o of chills.  On arrival pt was fidgeting but not drooling.

## 2018-03-30 NOTE — ED Notes (Signed)
Bed: LJ44 Expected date:  Expected time:  Means of arrival:  Comments: Hospice, O2 Chills and pain

## 2018-03-30 NOTE — ED Provider Notes (Signed)
[facility] DEPT Provider Note   CSN: [id number] Arrival date & time: 03/30/18  1314     History   Chief Complaint Chief Complaint  Patient presents with  . Chills  . fidgeting    HPI [name] is a 68 y.o. male.  Patient is a 68 year old male with extensive past medical history including metastatic lung cancer, AAA, coronary artery disease, COPD.  He is currently on hospice home care.  He was brought by EMS today for evaluation of fever and chills.  He reports difficulty with ambulation.  Patient is somewhat of a difficult historian and adds little useful history.  The history is provided by the patient.    Past Medical History:  Diagnosis Date  . AAA (abdominal aortic aneurysm) ([place])   . Abdominal aortic aneurysm ([place])    AAA And bilateral common iliac artery aneurysms  . BPH (benign prostatic hyperplasia)   . CAD (coronary artery disease)    Dr Ron Parker @ [place]   . Chronic mental illness 04/21/2011   Diagnosis unclear,  Family provides good care  . COPD (chronic obstructive pulmonary disease) ([place]) 04/21/2011   smoking  . Drug-induced skin rash 09/05/2016  . Ejection fraction    EF 35-40%, echo, May, 2010  . Hyperlipidemia   . Hyperplastic colon polyp 04/21/2011  . Hypertension   . Preop cardiovascular exam    Cardiac clearance for major vascular surgery, May, 2013  . Pulmonary nodule    Chest CT May, 2013, 2 small pulmonary nodules, needs appropriate followup,  this CT was not ordered by the cardiology team  . PVD (peripheral vascular disease) ([place]) 04/21/2011  . Retention of urine   . Shortness of breath   . Squamous cell lung cancer, left ([place]) 06/13/2016  . Tobacco abuse     Patient Active Problem List   Diagnosis Date Noted  . Malignant cachexia ([place])   . Palliative care by specialist   . DNR (do not resuscitate)   . End of life care 03/15/2018  . Failure to thrive in adult   . Port-A-Cath in place 10/09/2017  . Aortic  atherosclerosis ([place]) 08/21/2017  . Emphysema of lung ([place]) 08/21/2017  . Stage IV squamous cell carcinoma of left lung ([place]) 07/23/2017  . Encounter for antineoplastic immunotherapy 09/05/2016  . Drug-induced skin rash 09/05/2016  . Esophagitis 07/02/2016  . Squamous cell lung cancer, left ([place]) 06/13/2016  . Goals of care, counseling/discussion 06/13/2016  . Lung cancer ([place]) 04/13/2016  . Syncope 03/28/2016  . Near syncope 03/28/2016  . Smoker 05/28/2014  . Abdominal aneurysm without mention of rupture 08/31/2011  . Aneurysm of iliac artery ([place]) 08/31/2011  . Preop cardiovascular exam   . Abdominal aortic aneurysm ([place])   . Lung mass   . Hypertension   . Hyperlipidemia   . CAD (coronary artery disease)   . Hx of CABG   . Ejection fraction   . Tobacco abuse   . Iliac artery aneurysm, bilateral ([place]) 06/22/2011  . Debility 04/23/2011  . PSA elevation 04/23/2011  . Encounter for antineoplastic chemotherapy 04/21/2011  . COPD (chronic obstructive pulmonary disease) ([place]) 04/21/2011  . Hyperplastic colon polyp 04/21/2011  . PVD (peripheral vascular disease) ([place]) 04/21/2011  . Gastritis and duodenitis 04/21/2011  . Chronic mental illness 04/21/2011  . CONSTIPATION 11/12/2008  . CORONARY ARTERY BYPASS GRAFT, HX OF 11/09/2008  . Chronic systolic CHF (congestive heart failure) ([place]) 08/23/2008  . HEMOCCULT POSITIVE STOOL 08/23/2008    Past Surgical  History:  Procedure Laterality Date  . ABDOMINAL AORTA STENT  07/2011  . ABDOMINAL AORTAGRAM N/A 07/26/2011   Procedure: ABDOMINAL Maxcine Ham;  Surgeon: Conrad Elliott, MD;  Location: [facility] CATH LAB;  Service: Cardiovascular;  Laterality: N/A;  . ABDOMINAL AORTIC ANEURYSM REPAIR  07/2011  . CORONARY ARTERY BYPASS GRAFT  07/2008   CABG X5/notes 07/12/2010  . EMBOLIZATION Left 07/26/2011   Procedure: EMBOLIZATION;  Surgeon: Conrad Hardy, MD;  Location: [facility] CATH LAB;  Service: Cardiovascular;  Laterality: Left;  . IR IMAGING GUIDED PORT  INSERTION  09/05/2017  . TRANSURETHRAL RESECTION OF PROSTATE  11/29/2011   Procedure: TRANSURETHRAL RESECTION OF THE PROSTATE WITH GYRUS INSTRUMENTS;  Surgeon: Malka So, MD;  Location: [facility] ORS;  Service: Urology;  Laterality: N/A;  Prostate Ultrasound, and Biopsy  . VIDEO BRONCHOSCOPY Bilateral 04/12/2016   Procedure: VIDEO BRONCHOSCOPY WITH FLUORO;  Surgeon: Juanito Doom, MD;  Location: [facility];  Service: Cardiopulmonary;  Laterality: Bilateral;        Home Medications    Prior to Admission medications   Medication Sig Start Date End Date Taking? Authorizing Provider  albuterol (PROVENTIL HFA;VENTOLIN HFA) 108 (90 Base) MCG/ACT inhaler Inhale 1-2 puffs into the lungs every 6 (six) hours as needed for wheezing or shortness of breath. Patient not taking: Reported on 10/02/2017 09/18/17   Varney Biles, MD  HYDROcodone-acetaminophen (NORCO/VICODIN) 5-325 MG tablet Take 1-2 tablets by mouth every 6 (six) hours as needed for moderate pain. 03/17/18   Donne Hazel, MD  LORazepam (ATIVAN) 1 MG tablet Take 1 tablet (1 mg total) by mouth every 6 (six) hours as needed for anxiety or sleep. 03/17/18   Donne Hazel, MD  ondansetron (ZOFRAN) 4 MG tablet Take 1 tablet (4 mg total) by mouth every 6 (six) hours as needed for nausea. 03/17/18   Donne Hazel, MD  polyethylene glycol [facility] Floria Raveling) packet Take 17 g by mouth daily as needed for mild constipation. 03/17/18   Donne Hazel, MD    Family History Family History  Problem Relation Age of Onset  . Cancer Father        prostate  . Heart disease Father   . Diabetes Father   . Heart attack Father 89  . Arthritis Sister   . Hyperlipidemia Sister   . Hypertension Sister   . Anesthesia problems Neg Hx     Social History Social History   Tobacco Use  . Smoking status: Current Every Day Smoker    Packs/day: 1.00    Years: 50.00    Pack years: 50.00    Types: Cigarettes  . Smokeless tobacco: Never Used  Substance Use  Topics  . Alcohol use: No  . Drug use: No     Allergies   Ace inhibitors   Review of Systems Review of Systems  All other systems reviewed and are negative.    Physical Exam Updated Vital Signs There were no vitals taken for this visit.  Physical Exam Vitals signs and nursing note reviewed.  Constitutional:      Appearance: He is well-developed.     Comments: Patient is a cachectic, chronically ill-appearing 68 year old male.  He is in no acute distress.  HENT:     Head: Normocephalic and atraumatic.  Neck:     Musculoskeletal: Normal range of motion and neck supple.  Cardiovascular:     Rate and Rhythm: Normal rate and regular rhythm.     Heart sounds: No murmur. No friction rub.  Pulmonary:     Effort: Pulmonary effort is normal. No respiratory distress.     Breath sounds: Normal breath sounds. No wheezing or rales.  Abdominal:     General: Bowel sounds are normal. There is no distension.     Palpations: Abdomen is soft.     Tenderness: There is no abdominal tenderness.  Musculoskeletal: Normal range of motion.  Skin:    General: Skin is warm and dry.  Neurological:     Mental Status: He is oriented to person, place, and time.     Coordination: Coordination normal.      ED Treatments / Results  Labs (all labs ordered are listed, but only abnormal results are displayed) Labs Reviewed  BASIC METABOLIC PANEL  CBC WITH DIFFERENTIAL/PLATELET    EKG None  Radiology No results found.  Procedures Procedures (including critical care time)  Medications Ordered in ED Medications  sodium chloride 0.9 % bolus 1,000 mL (has no administration in time range)     Initial Impression / Assessment and Plan / ED Course  I have reviewed the triage vital signs and the nursing notes.  Pertinent labs & imaging results that were available during my care of the patient were reviewed by me and considered in my medical decision making (see chart for  details).  Patient with history of metastatic lung cancer presenting with complaints of confusion and congestion.  This began abruptly this afternoon.  The sister with who the patient lives called [phone] and the patient was transported here.  Before EMS arrived, the patient's mental status returned to baseline and he now has no complaints.  He did cough up phlegm here in the ER, but is denying any chest pain or difficulty breathing.  His laboratory studies are essentially unremarkable and chest x-ray shows no infiltrate.  According to the sister at bedside, the patient is now back to his baseline.  This patient is a hospice patient for his end-stage lung cancer.  I see no indication for admission.  Due to his productive cough, he will be prescribed an antibiotic and is to follow-up with hospice.  Final Clinical Impressions(s) / ED Diagnoses   Final diagnoses:  None    ED Discharge Orders    None       Veryl Speak, MD 03/30/18 [phone]

## 2018-03-30 NOTE — ED Notes (Signed)
Patient transported to X-ray 

## 2018-03-30 NOTE — Discharge Instructions (Signed)
Zithromax as prescribed.  Follow-up with primary doctor if symptoms not improving in the next few days, and return to the ER if symptoms significantly worsen or change.

## 2018-04-12 DIAGNOSIS — I25119 Atherosclerotic heart disease of native coronary artery with unspecified angina pectoris: Secondary | ICD-10-CM | POA: Diagnosis not present

## 2018-04-12 DIAGNOSIS — E43 Unspecified severe protein-calorie malnutrition: Secondary | ICD-10-CM | POA: Diagnosis not present

## 2018-04-12 DIAGNOSIS — J449 Chronic obstructive pulmonary disease, unspecified: Secondary | ICD-10-CM | POA: Diagnosis not present

## 2018-04-12 DIAGNOSIS — J9610 Chronic respiratory failure, unspecified whether with hypoxia or hypercapnia: Secondary | ICD-10-CM | POA: Diagnosis not present

## 2018-04-12 DIAGNOSIS — I429 Cardiomyopathy, unspecified: Secondary | ICD-10-CM | POA: Diagnosis not present

## 2018-04-12 DIAGNOSIS — C3412 Malignant neoplasm of upper lobe, left bronchus or lung: Secondary | ICD-10-CM | POA: Diagnosis not present

## 2018-04-15 DIAGNOSIS — C3412 Malignant neoplasm of upper lobe, left bronchus or lung: Secondary | ICD-10-CM | POA: Diagnosis not present

## 2018-04-15 DIAGNOSIS — I429 Cardiomyopathy, unspecified: Secondary | ICD-10-CM | POA: Diagnosis not present

## 2018-04-15 DIAGNOSIS — I25119 Atherosclerotic heart disease of native coronary artery with unspecified angina pectoris: Secondary | ICD-10-CM | POA: Diagnosis not present

## 2018-04-15 DIAGNOSIS — J449 Chronic obstructive pulmonary disease, unspecified: Secondary | ICD-10-CM | POA: Diagnosis not present

## 2018-04-15 DIAGNOSIS — E43 Unspecified severe protein-calorie malnutrition: Secondary | ICD-10-CM | POA: Diagnosis not present

## 2018-04-15 DIAGNOSIS — J9610 Chronic respiratory failure, unspecified whether with hypoxia or hypercapnia: Secondary | ICD-10-CM | POA: Diagnosis not present

## 2018-04-17 DIAGNOSIS — E43 Unspecified severe protein-calorie malnutrition: Secondary | ICD-10-CM | POA: Diagnosis not present

## 2018-04-17 DIAGNOSIS — J449 Chronic obstructive pulmonary disease, unspecified: Secondary | ICD-10-CM | POA: Diagnosis not present

## 2018-04-17 DIAGNOSIS — C3412 Malignant neoplasm of upper lobe, left bronchus or lung: Secondary | ICD-10-CM | POA: Diagnosis not present

## 2018-04-17 DIAGNOSIS — J9610 Chronic respiratory failure, unspecified whether with hypoxia or hypercapnia: Secondary | ICD-10-CM | POA: Diagnosis not present

## 2018-04-17 DIAGNOSIS — I25119 Atherosclerotic heart disease of native coronary artery with unspecified angina pectoris: Secondary | ICD-10-CM | POA: Diagnosis not present

## 2018-04-17 DIAGNOSIS — I429 Cardiomyopathy, unspecified: Secondary | ICD-10-CM | POA: Diagnosis not present

## 2018-04-18 DIAGNOSIS — J449 Chronic obstructive pulmonary disease, unspecified: Secondary | ICD-10-CM | POA: Diagnosis not present

## 2018-04-18 DIAGNOSIS — E43 Unspecified severe protein-calorie malnutrition: Secondary | ICD-10-CM | POA: Diagnosis not present

## 2018-04-18 DIAGNOSIS — J9610 Chronic respiratory failure, unspecified whether with hypoxia or hypercapnia: Secondary | ICD-10-CM | POA: Diagnosis not present

## 2018-04-18 DIAGNOSIS — C3412 Malignant neoplasm of upper lobe, left bronchus or lung: Secondary | ICD-10-CM | POA: Diagnosis not present

## 2018-04-18 DIAGNOSIS — I25119 Atherosclerotic heart disease of native coronary artery with unspecified angina pectoris: Secondary | ICD-10-CM | POA: Diagnosis not present

## 2018-04-18 DIAGNOSIS — I429 Cardiomyopathy, unspecified: Secondary | ICD-10-CM | POA: Diagnosis not present

## 2018-04-22 DIAGNOSIS — J9610 Chronic respiratory failure, unspecified whether with hypoxia or hypercapnia: Secondary | ICD-10-CM | POA: Diagnosis not present

## 2018-04-22 DIAGNOSIS — E43 Unspecified severe protein-calorie malnutrition: Secondary | ICD-10-CM | POA: Diagnosis not present

## 2018-04-22 DIAGNOSIS — I429 Cardiomyopathy, unspecified: Secondary | ICD-10-CM | POA: Diagnosis not present

## 2018-04-22 DIAGNOSIS — C3412 Malignant neoplasm of upper lobe, left bronchus or lung: Secondary | ICD-10-CM | POA: Diagnosis not present

## 2018-04-22 DIAGNOSIS — J449 Chronic obstructive pulmonary disease, unspecified: Secondary | ICD-10-CM | POA: Diagnosis not present

## 2018-04-22 DIAGNOSIS — I25119 Atherosclerotic heart disease of native coronary artery with unspecified angina pectoris: Secondary | ICD-10-CM | POA: Diagnosis not present

## 2018-04-24 DIAGNOSIS — I25119 Atherosclerotic heart disease of native coronary artery with unspecified angina pectoris: Secondary | ICD-10-CM | POA: Diagnosis not present

## 2018-04-24 DIAGNOSIS — J449 Chronic obstructive pulmonary disease, unspecified: Secondary | ICD-10-CM | POA: Diagnosis not present

## 2018-04-24 DIAGNOSIS — J9610 Chronic respiratory failure, unspecified whether with hypoxia or hypercapnia: Secondary | ICD-10-CM | POA: Diagnosis not present

## 2018-04-24 DIAGNOSIS — E43 Unspecified severe protein-calorie malnutrition: Secondary | ICD-10-CM | POA: Diagnosis not present

## 2018-04-24 DIAGNOSIS — I429 Cardiomyopathy, unspecified: Secondary | ICD-10-CM | POA: Diagnosis not present

## 2018-04-24 DIAGNOSIS — C3412 Malignant neoplasm of upper lobe, left bronchus or lung: Secondary | ICD-10-CM | POA: Diagnosis not present

## 2018-04-25 DIAGNOSIS — I429 Cardiomyopathy, unspecified: Secondary | ICD-10-CM | POA: Diagnosis not present

## 2018-04-25 DIAGNOSIS — I25119 Atherosclerotic heart disease of native coronary artery with unspecified angina pectoris: Secondary | ICD-10-CM | POA: Diagnosis not present

## 2018-04-25 DIAGNOSIS — E43 Unspecified severe protein-calorie malnutrition: Secondary | ICD-10-CM | POA: Diagnosis not present

## 2018-04-25 DIAGNOSIS — J9610 Chronic respiratory failure, unspecified whether with hypoxia or hypercapnia: Secondary | ICD-10-CM | POA: Diagnosis not present

## 2018-04-25 DIAGNOSIS — C3412 Malignant neoplasm of upper lobe, left bronchus or lung: Secondary | ICD-10-CM | POA: Diagnosis not present

## 2018-04-25 DIAGNOSIS — J449 Chronic obstructive pulmonary disease, unspecified: Secondary | ICD-10-CM | POA: Diagnosis not present

## 2018-04-28 DIAGNOSIS — J9610 Chronic respiratory failure, unspecified whether with hypoxia or hypercapnia: Secondary | ICD-10-CM | POA: Diagnosis not present

## 2018-04-28 DIAGNOSIS — J449 Chronic obstructive pulmonary disease, unspecified: Secondary | ICD-10-CM | POA: Diagnosis not present

## 2018-04-28 DIAGNOSIS — I25119 Atherosclerotic heart disease of native coronary artery with unspecified angina pectoris: Secondary | ICD-10-CM | POA: Diagnosis not present

## 2018-04-28 DIAGNOSIS — C3412 Malignant neoplasm of upper lobe, left bronchus or lung: Secondary | ICD-10-CM | POA: Diagnosis not present

## 2018-04-28 DIAGNOSIS — E43 Unspecified severe protein-calorie malnutrition: Secondary | ICD-10-CM | POA: Diagnosis not present

## 2018-04-28 DIAGNOSIS — I429 Cardiomyopathy, unspecified: Secondary | ICD-10-CM | POA: Diagnosis not present

## 2018-04-29 DIAGNOSIS — I429 Cardiomyopathy, unspecified: Secondary | ICD-10-CM | POA: Diagnosis not present

## 2018-04-29 DIAGNOSIS — J9610 Chronic respiratory failure, unspecified whether with hypoxia or hypercapnia: Secondary | ICD-10-CM | POA: Diagnosis not present

## 2018-04-29 DIAGNOSIS — E43 Unspecified severe protein-calorie malnutrition: Secondary | ICD-10-CM | POA: Diagnosis not present

## 2018-04-29 DIAGNOSIS — C3412 Malignant neoplasm of upper lobe, left bronchus or lung: Secondary | ICD-10-CM | POA: Diagnosis not present

## 2018-04-29 DIAGNOSIS — J449 Chronic obstructive pulmonary disease, unspecified: Secondary | ICD-10-CM | POA: Diagnosis not present

## 2018-04-29 DIAGNOSIS — I25119 Atherosclerotic heart disease of native coronary artery with unspecified angina pectoris: Secondary | ICD-10-CM | POA: Diagnosis not present

## 2018-05-01 DIAGNOSIS — I25119 Atherosclerotic heart disease of native coronary artery with unspecified angina pectoris: Secondary | ICD-10-CM | POA: Diagnosis not present

## 2018-05-01 DIAGNOSIS — C3412 Malignant neoplasm of upper lobe, left bronchus or lung: Secondary | ICD-10-CM | POA: Diagnosis not present

## 2018-05-01 DIAGNOSIS — I429 Cardiomyopathy, unspecified: Secondary | ICD-10-CM | POA: Diagnosis not present

## 2018-05-01 DIAGNOSIS — J449 Chronic obstructive pulmonary disease, unspecified: Secondary | ICD-10-CM | POA: Diagnosis not present

## 2018-05-01 DIAGNOSIS — E43 Unspecified severe protein-calorie malnutrition: Secondary | ICD-10-CM | POA: Diagnosis not present

## 2018-05-01 DIAGNOSIS — J9610 Chronic respiratory failure, unspecified whether with hypoxia or hypercapnia: Secondary | ICD-10-CM | POA: Diagnosis not present

## 2018-05-02 DIAGNOSIS — C3412 Malignant neoplasm of upper lobe, left bronchus or lung: Secondary | ICD-10-CM | POA: Diagnosis not present

## 2018-05-02 DIAGNOSIS — I25119 Atherosclerotic heart disease of native coronary artery with unspecified angina pectoris: Secondary | ICD-10-CM | POA: Diagnosis not present

## 2018-05-02 DIAGNOSIS — J9610 Chronic respiratory failure, unspecified whether with hypoxia or hypercapnia: Secondary | ICD-10-CM | POA: Diagnosis not present

## 2018-05-02 DIAGNOSIS — E43 Unspecified severe protein-calorie malnutrition: Secondary | ICD-10-CM | POA: Diagnosis not present

## 2018-05-02 DIAGNOSIS — I429 Cardiomyopathy, unspecified: Secondary | ICD-10-CM | POA: Diagnosis not present

## 2018-05-02 DIAGNOSIS — J449 Chronic obstructive pulmonary disease, unspecified: Secondary | ICD-10-CM | POA: Diagnosis not present

## 2018-05-03 DIAGNOSIS — C3412 Malignant neoplasm of upper lobe, left bronchus or lung: Secondary | ICD-10-CM | POA: Diagnosis not present

## 2018-05-03 DIAGNOSIS — J9610 Chronic respiratory failure, unspecified whether with hypoxia or hypercapnia: Secondary | ICD-10-CM | POA: Diagnosis not present

## 2018-05-03 DIAGNOSIS — I429 Cardiomyopathy, unspecified: Secondary | ICD-10-CM | POA: Diagnosis not present

## 2018-05-03 DIAGNOSIS — E43 Unspecified severe protein-calorie malnutrition: Secondary | ICD-10-CM | POA: Diagnosis not present

## 2018-05-03 DIAGNOSIS — J449 Chronic obstructive pulmonary disease, unspecified: Secondary | ICD-10-CM | POA: Diagnosis not present

## 2018-05-03 DIAGNOSIS — I25119 Atherosclerotic heart disease of native coronary artery with unspecified angina pectoris: Secondary | ICD-10-CM | POA: Diagnosis not present

## 2018-05-06 DIAGNOSIS — J449 Chronic obstructive pulmonary disease, unspecified: Secondary | ICD-10-CM | POA: Diagnosis not present

## 2018-05-06 DIAGNOSIS — I25119 Atherosclerotic heart disease of native coronary artery with unspecified angina pectoris: Secondary | ICD-10-CM | POA: Diagnosis not present

## 2018-05-06 DIAGNOSIS — E43 Unspecified severe protein-calorie malnutrition: Secondary | ICD-10-CM | POA: Diagnosis not present

## 2018-05-06 DIAGNOSIS — J9610 Chronic respiratory failure, unspecified whether with hypoxia or hypercapnia: Secondary | ICD-10-CM | POA: Diagnosis not present

## 2018-05-06 DIAGNOSIS — C3412 Malignant neoplasm of upper lobe, left bronchus or lung: Secondary | ICD-10-CM | POA: Diagnosis not present

## 2018-05-06 DIAGNOSIS — I429 Cardiomyopathy, unspecified: Secondary | ICD-10-CM | POA: Diagnosis not present

## 2018-05-08 DIAGNOSIS — J9610 Chronic respiratory failure, unspecified whether with hypoxia or hypercapnia: Secondary | ICD-10-CM | POA: Diagnosis not present

## 2018-05-08 DIAGNOSIS — E43 Unspecified severe protein-calorie malnutrition: Secondary | ICD-10-CM | POA: Diagnosis not present

## 2018-05-08 DIAGNOSIS — I429 Cardiomyopathy, unspecified: Secondary | ICD-10-CM | POA: Diagnosis not present

## 2018-05-08 DIAGNOSIS — J449 Chronic obstructive pulmonary disease, unspecified: Secondary | ICD-10-CM | POA: Diagnosis not present

## 2018-05-08 DIAGNOSIS — C3412 Malignant neoplasm of upper lobe, left bronchus or lung: Secondary | ICD-10-CM | POA: Diagnosis not present

## 2018-05-08 DIAGNOSIS — I25119 Atherosclerotic heart disease of native coronary artery with unspecified angina pectoris: Secondary | ICD-10-CM | POA: Diagnosis not present

## 2018-05-09 DIAGNOSIS — I25119 Atherosclerotic heart disease of native coronary artery with unspecified angina pectoris: Secondary | ICD-10-CM | POA: Diagnosis not present

## 2018-05-09 DIAGNOSIS — C3412 Malignant neoplasm of upper lobe, left bronchus or lung: Secondary | ICD-10-CM | POA: Diagnosis not present

## 2018-05-09 DIAGNOSIS — I429 Cardiomyopathy, unspecified: Secondary | ICD-10-CM | POA: Diagnosis not present

## 2018-05-09 DIAGNOSIS — J9610 Chronic respiratory failure, unspecified whether with hypoxia or hypercapnia: Secondary | ICD-10-CM | POA: Diagnosis not present

## 2018-05-09 DIAGNOSIS — J449 Chronic obstructive pulmonary disease, unspecified: Secondary | ICD-10-CM | POA: Diagnosis not present

## 2018-05-09 DIAGNOSIS — E43 Unspecified severe protein-calorie malnutrition: Secondary | ICD-10-CM | POA: Diagnosis not present

## 2018-05-11 DIAGNOSIS — C3412 Malignant neoplasm of upper lobe, left bronchus or lung: Secondary | ICD-10-CM | POA: Diagnosis not present

## 2018-05-11 DIAGNOSIS — E43 Unspecified severe protein-calorie malnutrition: Secondary | ICD-10-CM | POA: Diagnosis not present

## 2018-05-11 DIAGNOSIS — J449 Chronic obstructive pulmonary disease, unspecified: Secondary | ICD-10-CM | POA: Diagnosis not present

## 2018-05-11 DIAGNOSIS — I429 Cardiomyopathy, unspecified: Secondary | ICD-10-CM | POA: Diagnosis not present

## 2018-05-11 DIAGNOSIS — I25119 Atherosclerotic heart disease of native coronary artery with unspecified angina pectoris: Secondary | ICD-10-CM | POA: Diagnosis not present

## 2018-05-11 DIAGNOSIS — J9610 Chronic respiratory failure, unspecified whether with hypoxia or hypercapnia: Secondary | ICD-10-CM | POA: Diagnosis not present

## 2018-05-13 DIAGNOSIS — J9610 Chronic respiratory failure, unspecified whether with hypoxia or hypercapnia: Secondary | ICD-10-CM | POA: Diagnosis not present

## 2018-05-13 DIAGNOSIS — J449 Chronic obstructive pulmonary disease, unspecified: Secondary | ICD-10-CM | POA: Diagnosis not present

## 2018-05-13 DIAGNOSIS — I429 Cardiomyopathy, unspecified: Secondary | ICD-10-CM | POA: Diagnosis not present

## 2018-05-13 DIAGNOSIS — C3412 Malignant neoplasm of upper lobe, left bronchus or lung: Secondary | ICD-10-CM | POA: Diagnosis not present

## 2018-05-13 DIAGNOSIS — I25119 Atherosclerotic heart disease of native coronary artery with unspecified angina pectoris: Secondary | ICD-10-CM | POA: Diagnosis not present

## 2018-05-13 DIAGNOSIS — E43 Unspecified severe protein-calorie malnutrition: Secondary | ICD-10-CM | POA: Diagnosis not present

## 2018-05-15 DIAGNOSIS — J9610 Chronic respiratory failure, unspecified whether with hypoxia or hypercapnia: Secondary | ICD-10-CM | POA: Diagnosis not present

## 2018-05-15 DIAGNOSIS — E43 Unspecified severe protein-calorie malnutrition: Secondary | ICD-10-CM | POA: Diagnosis not present

## 2018-05-15 DIAGNOSIS — C3412 Malignant neoplasm of upper lobe, left bronchus or lung: Secondary | ICD-10-CM | POA: Diagnosis not present

## 2018-05-15 DIAGNOSIS — J449 Chronic obstructive pulmonary disease, unspecified: Secondary | ICD-10-CM | POA: Diagnosis not present

## 2018-05-15 DIAGNOSIS — I25119 Atherosclerotic heart disease of native coronary artery with unspecified angina pectoris: Secondary | ICD-10-CM | POA: Diagnosis not present

## 2018-05-15 DIAGNOSIS — I429 Cardiomyopathy, unspecified: Secondary | ICD-10-CM | POA: Diagnosis not present

## 2018-05-16 DIAGNOSIS — J9610 Chronic respiratory failure, unspecified whether with hypoxia or hypercapnia: Secondary | ICD-10-CM | POA: Diagnosis not present

## 2018-05-16 DIAGNOSIS — I25119 Atherosclerotic heart disease of native coronary artery with unspecified angina pectoris: Secondary | ICD-10-CM | POA: Diagnosis not present

## 2018-05-16 DIAGNOSIS — C3412 Malignant neoplasm of upper lobe, left bronchus or lung: Secondary | ICD-10-CM | POA: Diagnosis not present

## 2018-05-16 DIAGNOSIS — I429 Cardiomyopathy, unspecified: Secondary | ICD-10-CM | POA: Diagnosis not present

## 2018-05-16 DIAGNOSIS — E43 Unspecified severe protein-calorie malnutrition: Secondary | ICD-10-CM | POA: Diagnosis not present

## 2018-05-16 DIAGNOSIS — J449 Chronic obstructive pulmonary disease, unspecified: Secondary | ICD-10-CM | POA: Diagnosis not present

## 2018-05-18 DIAGNOSIS — C3412 Malignant neoplasm of upper lobe, left bronchus or lung: Secondary | ICD-10-CM | POA: Diagnosis not present

## 2018-05-18 DIAGNOSIS — J9610 Chronic respiratory failure, unspecified whether with hypoxia or hypercapnia: Secondary | ICD-10-CM | POA: Diagnosis not present

## 2018-05-18 DIAGNOSIS — E43 Unspecified severe protein-calorie malnutrition: Secondary | ICD-10-CM | POA: Diagnosis not present

## 2018-05-18 DIAGNOSIS — I429 Cardiomyopathy, unspecified: Secondary | ICD-10-CM | POA: Diagnosis not present

## 2018-05-18 DIAGNOSIS — I25119 Atherosclerotic heart disease of native coronary artery with unspecified angina pectoris: Secondary | ICD-10-CM | POA: Diagnosis not present

## 2018-05-18 DIAGNOSIS — J449 Chronic obstructive pulmonary disease, unspecified: Secondary | ICD-10-CM | POA: Diagnosis not present

## 2018-05-20 ENCOUNTER — Telehealth: Payer: Self-pay | Admitting: Internal Medicine

## 2018-05-20 DIAGNOSIS — J9610 Chronic respiratory failure, unspecified whether with hypoxia or hypercapnia: Secondary | ICD-10-CM | POA: Diagnosis not present

## 2018-05-20 DIAGNOSIS — E43 Unspecified severe protein-calorie malnutrition: Secondary | ICD-10-CM | POA: Diagnosis not present

## 2018-05-20 DIAGNOSIS — I25119 Atherosclerotic heart disease of native coronary artery with unspecified angina pectoris: Secondary | ICD-10-CM | POA: Diagnosis not present

## 2018-05-20 DIAGNOSIS — J449 Chronic obstructive pulmonary disease, unspecified: Secondary | ICD-10-CM | POA: Diagnosis not present

## 2018-05-20 DIAGNOSIS — C3412 Malignant neoplasm of upper lobe, left bronchus or lung: Secondary | ICD-10-CM | POA: Diagnosis not present

## 2018-05-20 DIAGNOSIS — I429 Cardiomyopathy, unspecified: Secondary | ICD-10-CM | POA: Diagnosis not present

## 2018-05-20 NOTE — Telephone Encounter (Signed)
Verbal orders given via VM 

## 2018-05-20 NOTE — Telephone Encounter (Signed)
Copied from [place] [id number]. Topic: Quick Communication - Home Health Verbal Orders >> May 20, 2018  2:01 PM Sheran Luz wrote: Caller/Agency: Helene Kelp with [facility] Number: [phone]  Helene Kelp calling to get verbal ok to continue hospice services. VM ok.

## 2018-05-22 DIAGNOSIS — I25119 Atherosclerotic heart disease of native coronary artery with unspecified angina pectoris: Secondary | ICD-10-CM | POA: Diagnosis not present

## 2018-05-22 DIAGNOSIS — J449 Chronic obstructive pulmonary disease, unspecified: Secondary | ICD-10-CM | POA: Diagnosis not present

## 2018-05-22 DIAGNOSIS — E43 Unspecified severe protein-calorie malnutrition: Secondary | ICD-10-CM | POA: Diagnosis not present

## 2018-05-22 DIAGNOSIS — J9610 Chronic respiratory failure, unspecified whether with hypoxia or hypercapnia: Secondary | ICD-10-CM | POA: Diagnosis not present

## 2018-05-22 DIAGNOSIS — C3412 Malignant neoplasm of upper lobe, left bronchus or lung: Secondary | ICD-10-CM | POA: Diagnosis not present

## 2018-05-22 DIAGNOSIS — I429 Cardiomyopathy, unspecified: Secondary | ICD-10-CM | POA: Diagnosis not present

## 2018-05-23 DIAGNOSIS — J449 Chronic obstructive pulmonary disease, unspecified: Secondary | ICD-10-CM | POA: Diagnosis not present

## 2018-05-23 DIAGNOSIS — C3412 Malignant neoplasm of upper lobe, left bronchus or lung: Secondary | ICD-10-CM | POA: Diagnosis not present

## 2018-05-23 DIAGNOSIS — J9610 Chronic respiratory failure, unspecified whether with hypoxia or hypercapnia: Secondary | ICD-10-CM | POA: Diagnosis not present

## 2018-05-23 DIAGNOSIS — I25119 Atherosclerotic heart disease of native coronary artery with unspecified angina pectoris: Secondary | ICD-10-CM | POA: Diagnosis not present

## 2018-05-23 DIAGNOSIS — E43 Unspecified severe protein-calorie malnutrition: Secondary | ICD-10-CM | POA: Diagnosis not present

## 2018-05-23 DIAGNOSIS — I429 Cardiomyopathy, unspecified: Secondary | ICD-10-CM | POA: Diagnosis not present

## 2018-05-27 DIAGNOSIS — I25119 Atherosclerotic heart disease of native coronary artery with unspecified angina pectoris: Secondary | ICD-10-CM | POA: Diagnosis not present

## 2018-05-27 DIAGNOSIS — E43 Unspecified severe protein-calorie malnutrition: Secondary | ICD-10-CM | POA: Diagnosis not present

## 2018-05-27 DIAGNOSIS — C3412 Malignant neoplasm of upper lobe, left bronchus or lung: Secondary | ICD-10-CM | POA: Diagnosis not present

## 2018-05-27 DIAGNOSIS — J9610 Chronic respiratory failure, unspecified whether with hypoxia or hypercapnia: Secondary | ICD-10-CM | POA: Diagnosis not present

## 2018-05-27 DIAGNOSIS — I429 Cardiomyopathy, unspecified: Secondary | ICD-10-CM | POA: Diagnosis not present

## 2018-05-27 DIAGNOSIS — J449 Chronic obstructive pulmonary disease, unspecified: Secondary | ICD-10-CM | POA: Diagnosis not present

## 2018-05-28 DIAGNOSIS — R000 Tachycardia, unspecified: Secondary | ICD-10-CM | POA: Diagnosis not present

## 2018-05-28 DIAGNOSIS — R55 Syncope and collapse: Secondary | ICD-10-CM | POA: Diagnosis not present

## 2018-05-28 DIAGNOSIS — R404 Transient alteration of awareness: Secondary | ICD-10-CM | POA: Diagnosis not present

## 2018-05-28 DIAGNOSIS — I959 Hypotension, unspecified: Secondary | ICD-10-CM | POA: Diagnosis not present

## 2018-05-28 DIAGNOSIS — R4020 Unspecified coma: Secondary | ICD-10-CM | POA: Diagnosis not present

## 2018-05-29 DIAGNOSIS — I25119 Atherosclerotic heart disease of native coronary artery with unspecified angina pectoris: Secondary | ICD-10-CM | POA: Diagnosis not present

## 2018-05-29 DIAGNOSIS — J449 Chronic obstructive pulmonary disease, unspecified: Secondary | ICD-10-CM | POA: Diagnosis not present

## 2018-05-29 DIAGNOSIS — C3412 Malignant neoplasm of upper lobe, left bronchus or lung: Secondary | ICD-10-CM | POA: Diagnosis not present

## 2018-05-29 DIAGNOSIS — E43 Unspecified severe protein-calorie malnutrition: Secondary | ICD-10-CM | POA: Diagnosis not present

## 2018-05-29 DIAGNOSIS — I429 Cardiomyopathy, unspecified: Secondary | ICD-10-CM | POA: Diagnosis not present

## 2018-05-29 DIAGNOSIS — J9610 Chronic respiratory failure, unspecified whether with hypoxia or hypercapnia: Secondary | ICD-10-CM | POA: Diagnosis not present

## 2018-05-30 DIAGNOSIS — E43 Unspecified severe protein-calorie malnutrition: Secondary | ICD-10-CM | POA: Diagnosis not present

## 2018-05-30 DIAGNOSIS — C3412 Malignant neoplasm of upper lobe, left bronchus or lung: Secondary | ICD-10-CM | POA: Diagnosis not present

## 2018-05-30 DIAGNOSIS — J9610 Chronic respiratory failure, unspecified whether with hypoxia or hypercapnia: Secondary | ICD-10-CM | POA: Diagnosis not present

## 2018-05-30 DIAGNOSIS — J449 Chronic obstructive pulmonary disease, unspecified: Secondary | ICD-10-CM | POA: Diagnosis not present

## 2018-05-30 DIAGNOSIS — I429 Cardiomyopathy, unspecified: Secondary | ICD-10-CM | POA: Diagnosis not present

## 2018-05-30 DIAGNOSIS — I25119 Atherosclerotic heart disease of native coronary artery with unspecified angina pectoris: Secondary | ICD-10-CM | POA: Diagnosis not present

## 2018-06-02 DIAGNOSIS — J449 Chronic obstructive pulmonary disease, unspecified: Secondary | ICD-10-CM | POA: Diagnosis not present

## 2018-06-02 DIAGNOSIS — I25119 Atherosclerotic heart disease of native coronary artery with unspecified angina pectoris: Secondary | ICD-10-CM | POA: Diagnosis not present

## 2018-06-02 DIAGNOSIS — J9610 Chronic respiratory failure, unspecified whether with hypoxia or hypercapnia: Secondary | ICD-10-CM | POA: Diagnosis not present

## 2018-06-02 DIAGNOSIS — C3412 Malignant neoplasm of upper lobe, left bronchus or lung: Secondary | ICD-10-CM | POA: Diagnosis not present

## 2018-06-02 DIAGNOSIS — I429 Cardiomyopathy, unspecified: Secondary | ICD-10-CM | POA: Diagnosis not present

## 2018-06-02 DIAGNOSIS — E43 Unspecified severe protein-calorie malnutrition: Secondary | ICD-10-CM | POA: Diagnosis not present

## 2018-06-03 ENCOUNTER — Telehealth: Payer: Self-pay

## 2018-06-03 DIAGNOSIS — J449 Chronic obstructive pulmonary disease, unspecified: Secondary | ICD-10-CM | POA: Diagnosis not present

## 2018-06-03 DIAGNOSIS — E43 Unspecified severe protein-calorie malnutrition: Secondary | ICD-10-CM | POA: Diagnosis not present

## 2018-06-03 DIAGNOSIS — J9610 Chronic respiratory failure, unspecified whether with hypoxia or hypercapnia: Secondary | ICD-10-CM | POA: Diagnosis not present

## 2018-06-03 DIAGNOSIS — I429 Cardiomyopathy, unspecified: Secondary | ICD-10-CM | POA: Diagnosis not present

## 2018-06-03 DIAGNOSIS — I25119 Atherosclerotic heart disease of native coronary artery with unspecified angina pectoris: Secondary | ICD-10-CM | POA: Diagnosis not present

## 2018-06-03 DIAGNOSIS — C3412 Malignant neoplasm of upper lobe, left bronchus or lung: Secondary | ICD-10-CM | POA: Diagnosis not present

## 2018-06-04 ENCOUNTER — Encounter: Payer: Self-pay | Admitting: Internal Medicine

## 2018-06-11 NOTE — Telephone Encounter (Signed)
Received dc from [employer]. DC is for cremation. Patient is a patient of Doctor Jenny Reichmann. DC will be taken to [facility] [place] for signature.   On 06/04/2018 I received dc back from Doctor Jenny Reichmann. Got dc ready and called funeral home to let them know dc is ready for pickup.  I also faxed a copy to the funeral home per the funeral home request.

## 2018-06-11 DEATH — deceased
